# Patient Record
Sex: Male | Born: 1935 | Race: White | Hispanic: No | Marital: Married | Smoking: Former smoker
Health system: Southern US, Community
[De-identification: ages and names within clinical notes are randomized; demographics above are authoritative.]

## PROBLEM LIST (undated history)

## (undated) DIAGNOSIS — IMO0001 Reserved for inherently not codable concepts without codable children: Secondary | ICD-10-CM

## (undated) DIAGNOSIS — R7881 Bacteremia: Secondary | ICD-10-CM

## (undated) DIAGNOSIS — G459 Transient cerebral ischemic attack, unspecified: Secondary | ICD-10-CM

## (undated) DIAGNOSIS — N189 Chronic kidney disease, unspecified: Secondary | ICD-10-CM

## (undated) DIAGNOSIS — G309 Alzheimer's disease, unspecified: Secondary | ICD-10-CM

## (undated) DIAGNOSIS — I10 Essential (primary) hypertension: Secondary | ICD-10-CM

## (undated) DIAGNOSIS — I2109 ST elevation (STEMI) myocardial infarction involving other coronary artery of anterior wall: Secondary | ICD-10-CM

## (undated) DIAGNOSIS — C801 Malignant (primary) neoplasm, unspecified: Secondary | ICD-10-CM

## (undated) DIAGNOSIS — K219 Gastro-esophageal reflux disease without esophagitis: Secondary | ICD-10-CM

## (undated) DIAGNOSIS — K851 Biliary acute pancreatitis without necrosis or infection: Secondary | ICD-10-CM

## (undated) DIAGNOSIS — I441 Atrioventricular block, second degree: Secondary | ICD-10-CM

## (undated) DIAGNOSIS — A419 Sepsis, unspecified organism: Secondary | ICD-10-CM

## (undated) DIAGNOSIS — K922 Gastrointestinal hemorrhage, unspecified: Secondary | ICD-10-CM

## (undated) DIAGNOSIS — H9190 Unspecified hearing loss, unspecified ear: Secondary | ICD-10-CM

## (undated) DIAGNOSIS — I2510 Atherosclerotic heart disease of native coronary artery without angina pectoris: Secondary | ICD-10-CM

## (undated) DIAGNOSIS — F0280 Dementia in other diseases classified elsewhere without behavioral disturbance: Secondary | ICD-10-CM

## (undated) DIAGNOSIS — Z1621 Resistance to vancomycin: Secondary | ICD-10-CM

## (undated) DIAGNOSIS — J4489 Other specified chronic obstructive pulmonary disease: Secondary | ICD-10-CM

## (undated) DIAGNOSIS — E785 Hyperlipidemia, unspecified: Secondary | ICD-10-CM

## (undated) DIAGNOSIS — J449 Chronic obstructive pulmonary disease, unspecified: Secondary | ICD-10-CM

## (undated) DIAGNOSIS — F05 Delirium due to known physiological condition: Secondary | ICD-10-CM

## (undated) HISTORY — DX: Atrioventricular block, second degree: I44.1

## (undated) HISTORY — DX: Transient cerebral ischemic attack, unspecified: G45.9

## (undated) HISTORY — DX: Essential (primary) hypertension: I10

## (undated) HISTORY — DX: Gastrointestinal hemorrhage, unspecified: K92.2

## (undated) HISTORY — DX: Gastro-esophageal reflux disease without esophagitis: K21.9

## (undated) HISTORY — DX: ST elevation (STEMI) myocardial infarction involving other coronary artery of anterior wall: I21.09

## (undated) HISTORY — DX: Hyperlipidemia, unspecified: E78.5

## (undated) HISTORY — DX: Biliary acute pancreatitis without necrosis or infection: K85.10

## (undated) HISTORY — DX: Bacteremia: R78.81

## (undated) HISTORY — DX: Unspecified hearing loss, unspecified ear: H91.90

## (undated) HISTORY — DX: Reserved for inherently not codable concepts without codable children: IMO0001

## (undated) HISTORY — PX: OTHER SURGICAL HISTORY: SHX169

## (undated) HISTORY — DX: Morbid (severe) obesity due to excess calories: E66.01

## (undated) HISTORY — PX: CHOLECYSTECTOMY: SHX55

## (undated) HISTORY — DX: Resistance to vancomycin: Z16.21

## (undated) HISTORY — DX: Atherosclerotic heart disease of native coronary artery without angina pectoris: I25.10

---
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
## 1998-02-25 ENCOUNTER — Ambulatory Visit (HOSPITAL_COMMUNITY): Admission: RE | Admit: 1998-02-25 | Discharge: 1998-02-25 | Payer: Self-pay | Admitting: Family Medicine

## 1999-03-16 ENCOUNTER — Emergency Department (HOSPITAL_COMMUNITY): Admission: EM | Admit: 1999-03-16 | Discharge: 1999-03-16 | Payer: Self-pay | Admitting: Emergency Medicine

## 2000-02-05 ENCOUNTER — Inpatient Hospital Stay (HOSPITAL_COMMUNITY): Admission: EM | Admit: 2000-02-05 | Discharge: 2000-02-08 | Payer: Self-pay | Admitting: Gastroenterology

## 2000-04-19 ENCOUNTER — Emergency Department (HOSPITAL_COMMUNITY): Admission: EM | Admit: 2000-04-19 | Discharge: 2000-04-19 | Payer: Self-pay | Admitting: Emergency Medicine

## 2003-10-29 ENCOUNTER — Emergency Department (HOSPITAL_COMMUNITY): Admission: EM | Admit: 2003-10-29 | Discharge: 2003-10-29 | Payer: Self-pay | Admitting: Emergency Medicine

## 2003-12-24 ENCOUNTER — Inpatient Hospital Stay (HOSPITAL_COMMUNITY): Admission: EM | Admit: 2003-12-24 | Discharge: 2003-12-28 | Payer: Self-pay | Admitting: Emergency Medicine

## 2003-12-28 ENCOUNTER — Emergency Department (HOSPITAL_COMMUNITY): Admission: EM | Admit: 2003-12-28 | Discharge: 2003-12-29 | Payer: Self-pay | Admitting: Emergency Medicine

## 2006-02-09 ENCOUNTER — Observation Stay (HOSPITAL_COMMUNITY): Admission: EM | Admit: 2006-02-09 | Discharge: 2006-02-10 | Payer: Self-pay | Admitting: Emergency Medicine

## 2006-02-25 ENCOUNTER — Encounter: Admission: RE | Admit: 2006-02-25 | Discharge: 2006-03-04 | Payer: Self-pay | Admitting: *Deleted

## 2006-03-10 ENCOUNTER — Inpatient Hospital Stay (HOSPITAL_COMMUNITY): Admission: EM | Admit: 2006-03-10 | Discharge: 2006-03-12 | Payer: Self-pay | Admitting: Emergency Medicine

## 2006-03-11 ENCOUNTER — Encounter: Payer: Self-pay | Admitting: Vascular Surgery

## 2006-05-19 ENCOUNTER — Inpatient Hospital Stay (HOSPITAL_COMMUNITY): Admission: EM | Admit: 2006-05-19 | Discharge: 2006-05-20 | Payer: Self-pay | Admitting: Emergency Medicine

## 2006-05-24 ENCOUNTER — Inpatient Hospital Stay (HOSPITAL_COMMUNITY): Admission: EM | Admit: 2006-05-24 | Discharge: 2006-05-27 | Payer: Self-pay | Admitting: Emergency Medicine

## 2006-05-25 ENCOUNTER — Encounter: Payer: Self-pay | Admitting: Gastroenterology

## 2006-06-24 ENCOUNTER — Ambulatory Visit (HOSPITAL_COMMUNITY): Admission: RE | Admit: 2006-06-24 | Discharge: 2006-06-24 | Payer: Self-pay | Admitting: *Deleted

## 2006-12-04 ENCOUNTER — Emergency Department (HOSPITAL_COMMUNITY): Admission: EM | Admit: 2006-12-04 | Discharge: 2006-12-04 | Payer: Self-pay | Admitting: Emergency Medicine

## 2008-01-10 ENCOUNTER — Inpatient Hospital Stay (HOSPITAL_COMMUNITY): Admission: EM | Admit: 2008-01-10 | Discharge: 2008-01-12 | Payer: Self-pay | Admitting: Emergency Medicine

## 2008-10-05 ENCOUNTER — Ambulatory Visit: Payer: Self-pay | Admitting: Diagnostic Radiology

## 2008-10-05 ENCOUNTER — Encounter: Payer: Self-pay | Admitting: Emergency Medicine

## 2008-10-06 ENCOUNTER — Inpatient Hospital Stay (HOSPITAL_COMMUNITY): Admission: EM | Admit: 2008-10-06 | Discharge: 2008-10-08 | Payer: Self-pay | Admitting: Internal Medicine

## 2008-10-12 ENCOUNTER — Ambulatory Visit (HOSPITAL_COMMUNITY): Admission: RE | Admit: 2008-10-12 | Discharge: 2008-10-12 | Payer: Self-pay | Admitting: Gastroenterology

## 2008-12-03 ENCOUNTER — Emergency Department (HOSPITAL_BASED_OUTPATIENT_CLINIC_OR_DEPARTMENT_OTHER): Admission: EM | Admit: 2008-12-03 | Discharge: 2008-12-03 | Payer: Self-pay | Admitting: Emergency Medicine

## 2008-12-03 ENCOUNTER — Ambulatory Visit: Payer: Self-pay | Admitting: Radiology

## 2009-02-21 ENCOUNTER — Inpatient Hospital Stay (HOSPITAL_COMMUNITY): Admission: EM | Admit: 2009-02-21 | Discharge: 2009-02-24 | Payer: Self-pay | Admitting: Internal Medicine

## 2009-02-21 ENCOUNTER — Ambulatory Visit: Payer: Self-pay | Admitting: Interventional Radiology

## 2009-02-21 ENCOUNTER — Encounter: Payer: Self-pay | Admitting: Emergency Medicine

## 2009-02-22 ENCOUNTER — Encounter (INDEPENDENT_AMBULATORY_CARE_PROVIDER_SITE_OTHER): Payer: Self-pay | Admitting: Internal Medicine

## 2009-02-22 ENCOUNTER — Encounter: Payer: Self-pay | Admitting: Infectious Disease

## 2009-02-22 ENCOUNTER — Ambulatory Visit: Payer: Self-pay | Admitting: Vascular Surgery

## 2009-08-26 ENCOUNTER — Encounter: Payer: Self-pay | Admitting: Emergency Medicine

## 2009-08-26 ENCOUNTER — Inpatient Hospital Stay (HOSPITAL_COMMUNITY): Admission: EM | Admit: 2009-08-26 | Discharge: 2009-08-27 | Payer: Self-pay | Admitting: Internal Medicine

## 2009-08-26 ENCOUNTER — Ambulatory Visit: Payer: Self-pay | Admitting: Diagnostic Radiology

## 2009-09-24 ENCOUNTER — Ambulatory Visit: Payer: Self-pay | Admitting: Diagnostic Radiology

## 2009-09-24 ENCOUNTER — Emergency Department (HOSPITAL_BASED_OUTPATIENT_CLINIC_OR_DEPARTMENT_OTHER): Admission: EM | Admit: 2009-09-24 | Discharge: 2009-09-24 | Payer: Self-pay | Admitting: Emergency Medicine

## 2009-09-24 ENCOUNTER — Inpatient Hospital Stay (HOSPITAL_COMMUNITY): Admission: EM | Admit: 2009-09-24 | Discharge: 2009-09-27 | Payer: Self-pay | Admitting: Internal Medicine

## 2009-09-26 ENCOUNTER — Encounter (INDEPENDENT_AMBULATORY_CARE_PROVIDER_SITE_OTHER): Payer: Self-pay | Admitting: Internal Medicine

## 2009-09-26 ENCOUNTER — Ambulatory Visit: Payer: Self-pay | Admitting: Vascular Surgery

## 2009-12-17 ENCOUNTER — Ambulatory Visit: Payer: Self-pay | Admitting: Diagnostic Radiology

## 2009-12-17 ENCOUNTER — Inpatient Hospital Stay (HOSPITAL_COMMUNITY): Admission: EM | Admit: 2009-12-17 | Discharge: 2009-12-19 | Payer: Self-pay

## 2009-12-17 ENCOUNTER — Encounter: Payer: Self-pay | Admitting: Emergency Medicine

## 2010-05-03 ENCOUNTER — Encounter: Payer: Self-pay | Admitting: Emergency Medicine

## 2010-05-03 ENCOUNTER — Ambulatory Visit: Payer: Self-pay | Admitting: Radiology

## 2010-05-03 ENCOUNTER — Inpatient Hospital Stay (HOSPITAL_COMMUNITY): Admission: AD | Admit: 2010-05-03 | Discharge: 2010-05-04 | Payer: Self-pay | Admitting: Internal Medicine

## 2010-05-04 ENCOUNTER — Encounter (INDEPENDENT_AMBULATORY_CARE_PROVIDER_SITE_OTHER): Payer: Self-pay | Admitting: Internal Medicine

## 2010-07-05 ENCOUNTER — Ambulatory Visit: Payer: Self-pay | Admitting: Radiology

## 2010-07-05 ENCOUNTER — Encounter: Payer: Self-pay | Admitting: Emergency Medicine

## 2010-07-05 ENCOUNTER — Inpatient Hospital Stay (HOSPITAL_COMMUNITY): Admission: EM | Admit: 2010-07-05 | Discharge: 2010-07-10 | Payer: Self-pay | Admitting: Internal Medicine

## 2010-07-07 ENCOUNTER — Ambulatory Visit: Payer: Self-pay | Admitting: Infectious Diseases

## 2010-08-31 ENCOUNTER — Emergency Department (HOSPITAL_BASED_OUTPATIENT_CLINIC_OR_DEPARTMENT_OTHER): Admission: EM | Admit: 2010-08-31 | Discharge: 2010-02-14 | Payer: Self-pay | Admitting: Emergency Medicine

## 2010-10-12 ENCOUNTER — Emergency Department (HOSPITAL_BASED_OUTPATIENT_CLINIC_OR_DEPARTMENT_OTHER)
Admission: EM | Admit: 2010-10-12 | Discharge: 2010-10-12 | Payer: Self-pay | Source: Home / Self Care | Admitting: Emergency Medicine

## 2010-10-14 ENCOUNTER — Encounter: Payer: Self-pay | Admitting: *Deleted

## 2010-10-15 ENCOUNTER — Encounter: Payer: Self-pay | Admitting: *Deleted

## 2010-10-16 LAB — COMPREHENSIVE METABOLIC PANEL
Albumin: 3.8 g/dL (ref 3.5–5.2)
Alkaline Phosphatase: 191 U/L — ABNORMAL HIGH (ref 39–117)
BUN: 10 mg/dL (ref 6–23)
CO2: 28 mEq/L (ref 19–32)
Chloride: 105 mEq/L (ref 96–112)
GFR calc non Af Amer: 58 mL/min — ABNORMAL LOW (ref >60)
Glucose, Bld: 119 mg/dL — ABNORMAL HIGH (ref 70–99)
Potassium: 4.3 mEq/L (ref 3.5–5.1)
Total Bilirubin: 1.1 mg/dL (ref 0.3–1.2)

## 2010-10-16 LAB — DIFFERENTIAL
Lymphs Abs: 2.1 10*3/uL (ref 0.7–4.0)
Monocytes Absolute: 0.7 10*3/uL (ref 0.1–1.0)
Monocytes Relative: 7 % (ref 3–12)
Neutro Abs: 6.6 10*3/uL (ref 1.7–7.7)
Neutrophils Relative %: 70 % (ref 43–77)

## 2010-10-16 LAB — CBC
HCT: 39.2 % (ref 39.0–52.0)
Hemoglobin: 13.3 g/dL (ref 13.0–17.0)
MCH: 30.4 pg (ref 26.0–34.0)
MCHC: 33.9 g/dL (ref 30.0–36.0)
MCV: 89.5 fL (ref 78.0–100.0)
RBC: 4.38 MIL/uL (ref 4.22–5.81)

## 2010-10-16 LAB — URINALYSIS, ROUTINE W REFLEX MICROSCOPIC
Bilirubin Urine: NEGATIVE
Hgb urine dipstick: NEGATIVE
Ketones, ur: NEGATIVE mg/dL
Protein, ur: NEGATIVE mg/dL
Urine Glucose, Fasting: NEGATIVE mg/dL
pH: 7.5 (ref 5.0–8.0)

## 2010-10-24 ENCOUNTER — Other Ambulatory Visit: Payer: Self-pay | Admitting: Diagnostic Neuroimaging

## 2010-10-24 DIAGNOSIS — R413 Other amnesia: Secondary | ICD-10-CM

## 2010-10-24 DIAGNOSIS — F0390 Unspecified dementia without behavioral disturbance: Secondary | ICD-10-CM

## 2010-10-27 ENCOUNTER — Ambulatory Visit
Admission: RE | Admit: 2010-10-27 | Discharge: 2010-10-27 | Disposition: A | Discharge status: Home / Self Care | Payer: Self-pay | Source: Ambulatory Visit | Attending: Diagnostic Neuroimaging | Admitting: Diagnostic Neuroimaging

## 2010-10-27 DIAGNOSIS — F0390 Unspecified dementia without behavioral disturbance: Secondary | ICD-10-CM

## 2010-10-27 DIAGNOSIS — R413 Other amnesia: Secondary | ICD-10-CM

## 2010-10-27 MED ORDER — GADOBENATE DIMEGLUMINE 529 MG/ML IV SOLN
15.0000 mL | Freq: Once | INTRAVENOUS | Status: AC | PRN
  Administered 2010-10-27: 15 mL via INTRAVENOUS

## 2010-12-06 LAB — COMPREHENSIVE METABOLIC PANEL
ALT: 151 U/L — ABNORMAL HIGH (ref 0–53)
ALT: 217 U/L — ABNORMAL HIGH (ref 0–53)
Albumin: 2.5 g/dL — ABNORMAL LOW (ref 3.5–5.2)
Albumin: 2.7 g/dL — ABNORMAL LOW (ref 3.5–5.2)
Alkaline Phosphatase: 189 U/L — ABNORMAL HIGH (ref 39–117)
Alkaline Phosphatase: 205 U/L — ABNORMAL HIGH (ref 39–117)
Alkaline Phosphatase: 231 U/L — ABNORMAL HIGH (ref 39–117)
BUN: 11 mg/dL (ref 6–23)
BUN: 5 mg/dL — ABNORMAL LOW (ref 6–23)
BUN: 9 mg/dL (ref 6–23)
CO2: 25 mEq/L (ref 19–32)
Calcium: 8.1 mg/dL — ABNORMAL LOW (ref 8.4–10.5)
Chloride: 105 mEq/L (ref 96–112)
Chloride: 108 mEq/L (ref 96–112)
Creatinine, Ser: 1.06 mg/dL (ref 0.4–1.5)
Creatinine, Ser: 1.18 mg/dL (ref 0.4–1.5)
GFR calc Af Amer: >60 mL/min (ref >60)
GFR calc non Af Amer: 46 mL/min — ABNORMAL LOW (ref >60)
GFR calc non Af Amer: 59 mL/min — ABNORMAL LOW (ref >60)
Glucose, Bld: 101 mg/dL — ABNORMAL HIGH (ref 70–99)
Glucose, Bld: 96 mg/dL (ref 70–99)
Glucose, Bld: 97 mg/dL (ref 70–99)
Potassium: 3.6 mEq/L (ref 3.5–5.1)
Potassium: 3.7 mEq/L (ref 3.5–5.1)
Potassium: 3.7 mEq/L (ref 3.5–5.1)
Potassium: 3.8 mEq/L (ref 3.5–5.1)
Sodium: 136 mEq/L (ref 135–145)
Sodium: 137 mEq/L (ref 135–145)
Total Bilirubin: 0.8 mg/dL (ref 0.3–1.2)
Total Bilirubin: 1.7 mg/dL — ABNORMAL HIGH (ref 0.3–1.2)
Total Protein: 4.7 g/dL — ABNORMAL LOW (ref 6.0–8.3)
Total Protein: 5.6 g/dL — ABNORMAL LOW (ref 6.0–8.3)

## 2010-12-06 LAB — GLUCOSE, CAPILLARY
Glucose-Capillary: 107 mg/dL — ABNORMAL HIGH (ref 70–99)
Glucose-Capillary: 109 mg/dL — ABNORMAL HIGH (ref 70–99)
Glucose-Capillary: 117 mg/dL — ABNORMAL HIGH (ref 70–99)
Glucose-Capillary: 118 mg/dL — ABNORMAL HIGH (ref 70–99)
Glucose-Capillary: 140 mg/dL — ABNORMAL HIGH (ref 70–99)
Glucose-Capillary: 88 mg/dL (ref 70–99)
Glucose-Capillary: 93 mg/dL (ref 70–99)
Glucose-Capillary: 93 mg/dL (ref 70–99)
Glucose-Capillary: 93 mg/dL (ref 70–99)

## 2010-12-06 LAB — DIFFERENTIAL
Basophils Absolute: 0 10*3/uL (ref 0.0–0.1)
Basophils Relative: 0 % (ref 0–1)
Eosinophils Absolute: 0 10*3/uL (ref 0.0–0.7)
Monocytes Relative: 6 % (ref 3–12)
Neutrophils Relative %: 93 % — ABNORMAL HIGH (ref 43–77)

## 2010-12-06 LAB — CBC
HCT: 32.2 % — ABNORMAL LOW (ref 39.0–52.0)
HCT: 33.6 % — ABNORMAL LOW (ref 39.0–52.0)
HCT: 34.6 % — ABNORMAL LOW (ref 39.0–52.0)
Hemoglobin: 11.2 g/dL — ABNORMAL LOW (ref 13.0–17.0)
MCH: 30 pg (ref 26.0–34.0)
MCV: 89.3 fL (ref 78.0–100.0)
MCV: 91.8 fL (ref 78.0–100.0)
MCV: 91.8 fL (ref 78.0–100.0)
Platelets: 188 10*3/uL (ref 150–400)
Platelets: 214 10*3/uL (ref 150–400)
RBC: 3.52 MIL/uL — ABNORMAL LOW (ref 4.22–5.81)
RBC: 3.77 MIL/uL — ABNORMAL LOW (ref 4.22–5.81)
RDW: 14 % (ref 11.5–15.5)
RDW: 14.2 % (ref 11.5–15.5)
RDW: 14.5 % (ref 11.5–15.5)
WBC: 15.6 10*3/uL — ABNORMAL HIGH (ref 4.0–10.5)
WBC: 5.8 10*3/uL (ref 4.0–10.5)
WBC: 7.4 10*3/uL (ref 4.0–10.5)
WBC: 9.4 10*3/uL (ref 4.0–10.5)

## 2010-12-06 LAB — VANCOMYCIN, TROUGH: Vancomycin Tr: 12 ug/mL (ref 10.0–20.0)

## 2010-12-06 LAB — LACTIC ACID, PLASMA: Lactic Acid, Venous: 2.1 mmol/L (ref 0.5–2.2)

## 2010-12-07 LAB — DIFFERENTIAL
Eosinophils Relative: 0 % (ref 0–5)
Lymphocytes Relative: 10 % — ABNORMAL LOW (ref 12–46)
Lymphs Abs: 0.7 10*3/uL (ref 0.7–4.0)

## 2010-12-07 LAB — URINALYSIS, ROUTINE W REFLEX MICROSCOPIC
Bilirubin Urine: NEGATIVE
Hgb urine dipstick: NEGATIVE
Specific Gravity, Urine: 1.01 (ref 1.005–1.030)
Urobilinogen, UA: 1 mg/dL (ref 0.0–1.0)
pH: 8 (ref 5.0–8.0)

## 2010-12-07 LAB — COMPREHENSIVE METABOLIC PANEL
AST: 752 U/L — ABNORMAL HIGH (ref 0–37)
Albumin: 4 g/dL (ref 3.5–5.2)
CO2: 26 mEq/L (ref 19–32)
Calcium: 9.3 mg/dL (ref 8.4–10.5)
Creatinine, Ser: 1.2 mg/dL (ref 0.4–1.5)
GFR calc Af Amer: >60 mL/min (ref >60)
GFR calc non Af Amer: 58 mL/min — ABNORMAL LOW (ref >60)
Total Protein: 6.8 g/dL (ref 6.0–8.3)

## 2010-12-07 LAB — CULTURE, BLOOD (ROUTINE X 2)

## 2010-12-07 LAB — CBC
MCH: 30.4 pg (ref 26.0–34.0)
MCHC: 33.2 g/dL (ref 30.0–36.0)
Platelets: 240 10*3/uL (ref 150–400)
RDW: 13.4 % (ref 11.5–15.5)

## 2010-12-07 LAB — POCT CARDIAC MARKERS
CKMB, poc: <1 ng/mL — ABNORMAL LOW (ref 1.0–8.0)
Myoglobin, poc: 69.1 ng/mL (ref 12–200)

## 2010-12-07 LAB — LIPASE, BLOOD: Lipase: 945 U/L — ABNORMAL HIGH (ref 23–300)

## 2010-12-08 LAB — LIPID PANEL
Cholesterol: 202 mg/dL — ABNORMAL HIGH (ref 0–200)
LDL Cholesterol: 125 mg/dL — ABNORMAL HIGH (ref 0–99)
Triglycerides: 120 mg/dL (ref <150)
VLDL: 24 mg/dL (ref 0–40)

## 2010-12-08 LAB — COMPREHENSIVE METABOLIC PANEL
ALT: 13 U/L (ref 0–53)
AST: 17 U/L (ref 0–37)
Albumin: 3 g/dL — ABNORMAL LOW (ref 3.5–5.2)
CO2: 26 mEq/L (ref 19–32)
Calcium: 8.8 mg/dL (ref 8.4–10.5)
Chloride: 108 mEq/L (ref 96–112)
GFR calc Af Amer: >60 mL/min (ref >60)
GFR calc non Af Amer: 58 mL/min — ABNORMAL LOW (ref >60)
Sodium: 140 mEq/L (ref 135–145)

## 2010-12-08 LAB — CBC
Hemoglobin: 11.5 g/dL — ABNORMAL LOW (ref 13.0–17.0)
MCH: 31.1 pg (ref 26.0–34.0)
MCHC: 34.2 g/dL (ref 30.0–36.0)
MCHC: 33.8 g/dL (ref 30.0–36.0)
RBC: 3.69 MIL/uL — ABNORMAL LOW (ref 4.22–5.81)
RDW: 14.1 % (ref 11.5–15.5)

## 2010-12-08 LAB — POCT B-TYPE NATRIURETIC PEPTIDE (BNP): B Natriuretic Peptide, POC: 30.5 pg/mL (ref 0–100)

## 2010-12-08 LAB — D-DIMER, QUANTITATIVE: D-Dimer, Quant: 1.86 ug/mL-FEU — ABNORMAL HIGH (ref 0.00–0.48)

## 2010-12-08 LAB — POCT CARDIAC MARKERS: Myoglobin, poc: 55.3 ng/mL (ref 12–200)

## 2010-12-08 LAB — URINALYSIS, ROUTINE W REFLEX MICROSCOPIC
Bilirubin Urine: NEGATIVE
Glucose, UA: NEGATIVE mg/dL
Hgb urine dipstick: NEGATIVE
Ketones, ur: NEGATIVE mg/dL
Nitrite: NEGATIVE
pH: 7 (ref 5.0–8.0)

## 2010-12-08 LAB — CULTURE, BLOOD (ROUTINE X 2): Culture: NO GROWTH

## 2010-12-08 LAB — BASIC METABOLIC PANEL
BUN: 11 mg/dL (ref 6–23)
CO2: 25 mEq/L (ref 19–32)
Calcium: 9 mg/dL (ref 8.4–10.5)
Creatinine, Ser: 1.2 mg/dL (ref 0.4–1.5)
GFR calc non Af Amer: 58 mL/min — ABNORMAL LOW (ref >60)
Glucose, Bld: 60 mg/dL — ABNORMAL LOW (ref 70–99)

## 2010-12-08 LAB — CARDIAC PANEL(CRET KIN+CKTOT+MB+TROPI)
CK, MB: 1.2 ng/mL (ref 0.3–4.0)
CK, MB: 1.2 ng/mL (ref 0.3–4.0)
Total CK: 40 U/L (ref 7–232)

## 2010-12-08 LAB — DIFFERENTIAL
Basophils Absolute: 0 10*3/uL (ref 0.0–0.1)
Basophils Relative: 0 % (ref 0–1)
Eosinophils Absolute: 0.1 10*3/uL (ref 0.0–0.7)
Monocytes Absolute: 0.5 10*3/uL (ref 0.1–1.0)
Monocytes Relative: 7 % (ref 3–12)
Neutro Abs: 4.6 10*3/uL (ref 1.7–7.7)
Neutrophils Relative %: 67 % (ref 43–77)

## 2010-12-08 LAB — URINE CULTURE
Colony Count: NO GROWTH
Culture  Setup Time: 201108111040
Culture: NO GROWTH
Special Requests: NEGATIVE

## 2010-12-08 LAB — HEPATIC FUNCTION PANEL
ALT: 12 U/L (ref 0–53)
Indirect Bilirubin: 0.7 mg/dL (ref 0.3–0.9)
Total Protein: 6.6 g/dL (ref 6.0–8.3)

## 2010-12-08 LAB — GLUCOSE, CAPILLARY
Glucose-Capillary: 110 mg/dL — ABNORMAL HIGH (ref 70–99)
Glucose-Capillary: 97 mg/dL (ref 70–99)

## 2010-12-08 LAB — PROTIME-INR: INR: 1.08 (ref 0.00–1.49)

## 2010-12-10 LAB — CBC
HCT: 33.5 % — ABNORMAL LOW (ref 39.0–52.0)
HCT: 34.2 % — ABNORMAL LOW (ref 39.0–52.0)
HCT: 40.4 % (ref 39.0–52.0)
Hemoglobin: 11.5 g/dL — ABNORMAL LOW (ref 13.0–17.0)
Hemoglobin: 11.9 g/dL — ABNORMAL LOW (ref 13.0–17.0)
Hemoglobin: 13.8 g/dL (ref 13.0–17.0)
MCHC: 34.4 g/dL (ref 30.0–36.0)
MCHC: 34.8 g/dL (ref 30.0–36.0)
MCHC: 34.8 g/dL (ref 30.0–36.0)
MCV: 92.7 fL (ref 78.0–100.0)
MCV: 94.2 fL (ref 78.0–100.0)
Platelets: 164 10*3/uL (ref 150–400)
Platelets: 167 10*3/uL (ref 150–400)
Platelets: 260 10*3/uL (ref 150–400)
RBC: 3.63 MIL/uL — ABNORMAL LOW (ref 4.22–5.81)
RBC: 3.67 MIL/uL — ABNORMAL LOW (ref 4.22–5.81)
RBC: 4.38 MIL/uL (ref 4.22–5.81)
RDW: 13.5 % (ref 11.5–15.5)
RDW: 13.6 % (ref 11.5–15.5)
RDW: 13.7 % (ref 11.5–15.5)
WBC: 18 10*3/uL — ABNORMAL HIGH (ref 4.0–10.5)

## 2010-12-10 LAB — COMPREHENSIVE METABOLIC PANEL
ALT: 366 U/L — ABNORMAL HIGH (ref 0–53)
ALT: 718 U/L — ABNORMAL HIGH (ref 0–53)
AST: 1270 U/L — ABNORMAL HIGH (ref 0–37)
AST: 334 U/L — ABNORMAL HIGH (ref 0–37)
AST: 442 U/L — ABNORMAL HIGH (ref 0–37)
Albumin: 3 g/dL — ABNORMAL LOW (ref 3.5–5.2)
Albumin: 3.8 g/dL (ref 3.5–5.2)
Alkaline Phosphatase: 226 U/L — ABNORMAL HIGH (ref 39–117)
BUN: 8 mg/dL (ref 6–23)
BUN: 14 mg/dL (ref 6–23)
CO2: 25 mEq/L (ref 19–32)
CO2: 26 mEq/L (ref 19–32)
CO2: 21 mEq/L (ref 19–32)
Calcium: 8.4 mg/dL (ref 8.4–10.5)
Calcium: 8.8 mg/dL (ref 8.4–10.5)
Calcium: 8.8 mg/dL (ref 8.4–10.5)
Chloride: 103 mEq/L (ref 96–112)
Chloride: 98 mEq/L (ref 96–112)
Creatinine, Ser: 1 mg/dL (ref 0.4–1.5)
Creatinine, Ser: 1.24 mg/dL (ref 0.4–1.5)
GFR calc Af Amer: >60 mL/min (ref >60)
GFR calc Af Amer: >60 mL/min (ref >60)
GFR calc non Af Amer: 53 mL/min — ABNORMAL LOW (ref >60)
GFR calc non Af Amer: 56 mL/min — ABNORMAL LOW (ref >60)
Glucose, Bld: 112 mg/dL — ABNORMAL HIGH (ref 70–99)
Potassium: 3.7 mEq/L (ref 3.5–5.1)
Sodium: 137 mEq/L (ref 135–145)
Sodium: 139 mEq/L (ref 135–145)
Total Bilirubin: 1 mg/dL (ref 0.3–1.2)
Total Protein: 5.1 g/dL — ABNORMAL LOW (ref 6.0–8.3)
Total Protein: 5.2 g/dL — ABNORMAL LOW (ref 6.0–8.3)

## 2010-12-10 LAB — URINALYSIS, ROUTINE W REFLEX MICROSCOPIC
Hgb urine dipstick: NEGATIVE
Ketones, ur: NEGATIVE mg/dL
Nitrite: NEGATIVE
Protein, ur: NEGATIVE mg/dL
Specific Gravity, Urine: 1.015 (ref 1.005–1.030)
Urobilinogen, UA: 2 mg/dL — ABNORMAL HIGH (ref 0.0–1.0)

## 2010-12-10 LAB — DIFFERENTIAL
Basophils Absolute: 0.1 10*3/uL (ref 0.0–0.1)
Basophils Relative: 0 % (ref 0–1)
Basophils Relative: 1 % (ref 0–1)
Eosinophils Absolute: 0.1 10*3/uL (ref 0.0–0.7)
Eosinophils Relative: 0 % (ref 0–5)
Lymphocytes Relative: 11 % — ABNORMAL LOW (ref 12–46)
Lymphocytes Relative: 7 % — ABNORMAL LOW (ref 12–46)
Lymphs Abs: 1 10*3/uL (ref 0.7–4.0)
Lymphs Abs: 1 10*3/uL (ref 0.7–4.0)
Monocytes Relative: 5 % (ref 3–12)
Monocytes Relative: 5 % (ref 3–12)
Monocytes Relative: 3 % (ref 3–12)
Neutro Abs: 7.8 10*3/uL — ABNORMAL HIGH (ref 1.7–7.7)
Neutro Abs: 14.4 10*3/uL — ABNORMAL HIGH (ref 1.7–7.7)
Neutrophils Relative %: 83 % — ABNORMAL HIGH (ref 43–77)
Neutrophils Relative %: 88 % — ABNORMAL HIGH (ref 43–77)
Neutrophils Relative %: 80 % — ABNORMAL HIGH (ref 43–77)

## 2010-12-10 LAB — POCT CARDIAC MARKERS
CKMB, poc: <1 ng/mL — ABNORMAL LOW (ref 1.0–8.0)
CKMB, poc: <1 ng/mL — ABNORMAL LOW (ref 1.0–8.0)
Myoglobin, poc: 384 ng/mL (ref 12–200)
Troponin i, poc: <0.05 ng/mL (ref 0.00–0.09)

## 2010-12-10 LAB — URINE CULTURE: Colony Count: 30000

## 2010-12-10 LAB — PROTIME-INR
INR: 0.89 (ref 0.00–1.49)
Prothrombin Time: 12 seconds (ref 11.6–15.2)

## 2010-12-10 LAB — CULTURE, BLOOD (ROUTINE X 2): Culture: NO GROWTH

## 2010-12-10 LAB — GLUCOSE, CAPILLARY
Glucose-Capillary: 109 mg/dL — ABNORMAL HIGH (ref 70–99)
Glucose-Capillary: 112 mg/dL — ABNORMAL HIGH (ref 70–99)
Glucose-Capillary: 128 mg/dL — ABNORMAL HIGH (ref 70–99)
Glucose-Capillary: 132 mg/dL — ABNORMAL HIGH (ref 70–99)

## 2010-12-10 LAB — LIPASE, BLOOD: Lipase: 11 U/L (ref 11–59)

## 2010-12-10 LAB — MAGNESIUM: Magnesium: 2.1 mg/dL (ref 1.5–2.5)

## 2010-12-11 LAB — DIFFERENTIAL
Basophils Absolute: 0 10*3/uL (ref 0.0–0.1)
Eosinophils Relative: 2 % (ref 0–5)
Lymphocytes Relative: 24 % (ref 12–46)
Lymphs Abs: 1.8 10*3/uL (ref 0.7–4.0)
Monocytes Absolute: 0.4 10*3/uL (ref 0.1–1.0)
Monocytes Relative: 6 % (ref 3–12)
Neutro Abs: 5.2 10*3/uL (ref 1.7–7.7)

## 2010-12-11 LAB — URINALYSIS, ROUTINE W REFLEX MICROSCOPIC
Glucose, UA: NEGATIVE mg/dL
Hgb urine dipstick: NEGATIVE
Ketones, ur: NEGATIVE mg/dL
Protein, ur: NEGATIVE mg/dL
Urobilinogen, UA: 1 mg/dL (ref 0.0–1.0)

## 2010-12-11 LAB — COMPREHENSIVE METABOLIC PANEL
AST: 150 U/L — ABNORMAL HIGH (ref 0–37)
Albumin: 3.6 g/dL (ref 3.5–5.2)
BUN: 16 mg/dL (ref 6–23)
Creatinine, Ser: 1.2 mg/dL (ref 0.4–1.5)
GFR calc Af Amer: >60 mL/min (ref >60)
Total Protein: 6.8 g/dL (ref 6.0–8.3)

## 2010-12-11 LAB — CBC
HCT: 40.1 % (ref 39.0–52.0)
MCV: 92.9 fL (ref 78.0–100.0)
Platelets: 254 10*3/uL (ref 150–400)
RDW: 13.2 % (ref 11.5–15.5)
WBC: 7.5 10*3/uL (ref 4.0–10.5)

## 2010-12-17 LAB — BASIC METABOLIC PANEL WITH GFR
BUN: 7 mg/dL (ref 6–23)
CO2: 27 meq/L (ref 19–32)
Calcium: 8.5 mg/dL (ref 8.4–10.5)
Chloride: 105 meq/L (ref 96–112)
Creatinine, Ser: 1.09 mg/dL (ref 0.4–1.5)
GFR calc non Af Amer: >60 mL/min
Glucose, Bld: 119 mg/dL — ABNORMAL HIGH (ref 70–99)
Potassium: 3.8 meq/L (ref 3.5–5.1)
Sodium: 139 meq/L (ref 135–145)

## 2010-12-17 LAB — COMPREHENSIVE METABOLIC PANEL WITH GFR
ALT: 117 U/L — ABNORMAL HIGH (ref 0–53)
AST: 94 U/L — ABNORMAL HIGH (ref 0–37)
Albumin: 2.9 g/dL — ABNORMAL LOW (ref 3.5–5.2)
Alkaline Phosphatase: 111 U/L (ref 39–117)
BUN: 5 mg/dL — ABNORMAL LOW (ref 6–23)
CO2: 24 meq/L (ref 19–32)
Calcium: 8.6 mg/dL (ref 8.4–10.5)
Chloride: 111 meq/L (ref 96–112)
Creatinine, Ser: 0.93 mg/dL (ref 0.4–1.5)
GFR calc non Af Amer: >60 mL/min
Glucose, Bld: 136 mg/dL — ABNORMAL HIGH (ref 70–99)
Potassium: 3.6 meq/L (ref 3.5–5.1)
Sodium: 141 meq/L (ref 135–145)
Total Bilirubin: 0.6 mg/dL (ref 0.3–1.2)
Total Protein: 5.2 g/dL — ABNORMAL LOW (ref 6.0–8.3)

## 2010-12-17 LAB — CBC
HCT: 33.2 % — ABNORMAL LOW (ref 39.0–52.0)
HCT: 34.8 % — ABNORMAL LOW (ref 39.0–52.0)
Hemoglobin: 11.8 g/dL — ABNORMAL LOW (ref 13.0–17.0)
MCHC: 34 g/dL (ref 30.0–36.0)
MCV: 94.4 fL (ref 78.0–100.0)
Platelets: 196 K/uL (ref 150–400)
Platelets: 197 10*3/uL (ref 150–400)
RBC: 3.68 MIL/uL — ABNORMAL LOW (ref 4.22–5.81)
RDW: 14.3 % (ref 11.5–15.5)
RDW: 14.6 % (ref 11.5–15.5)
WBC: 5.2 K/uL (ref 4.0–10.5)

## 2010-12-17 LAB — GLUCOSE, CAPILLARY
Glucose-Capillary: 113 mg/dL — ABNORMAL HIGH (ref 70–99)
Glucose-Capillary: 124 mg/dL — ABNORMAL HIGH (ref 70–99)
Glucose-Capillary: 127 mg/dL — ABNORMAL HIGH (ref 70–99)
Glucose-Capillary: 136 mg/dL — ABNORMAL HIGH (ref 70–99)
Glucose-Capillary: 155 mg/dL — ABNORMAL HIGH (ref 70–99)

## 2010-12-17 LAB — LIPASE, BLOOD: Lipase: 16 U/L (ref 11–59)

## 2010-12-18 LAB — CULTURE, BLOOD (ROUTINE X 2): Culture: NO GROWTH

## 2010-12-18 LAB — COMPREHENSIVE METABOLIC PANEL
AST: 148 U/L — ABNORMAL HIGH (ref 0–37)
Albumin: 3.4 g/dL — ABNORMAL LOW (ref 3.5–5.2)
Calcium: 9.1 mg/dL (ref 8.4–10.5)
Creatinine, Ser: 1.1 mg/dL (ref 0.4–1.5)
GFR calc Af Amer: >60 mL/min (ref >60)
Total Protein: 6.1 g/dL (ref 6.0–8.3)

## 2010-12-18 LAB — DIFFERENTIAL
Eosinophils Relative: 1 % (ref 0–5)
Lymphocytes Relative: 33 % (ref 12–46)
Lymphs Abs: 2.2 10*3/uL (ref 0.7–4.0)
Monocytes Absolute: 0.3 10*3/uL (ref 0.1–1.0)

## 2010-12-18 LAB — POCT B-TYPE NATRIURETIC PEPTIDE (BNP)
B Natriuretic Peptide, POC: 10.4 pg/mL (ref 0–100)
B Natriuretic Peptide, POC: 9.1 pg/mL (ref 0–100)

## 2010-12-18 LAB — LIPASE, BLOOD: Lipase: 1415 U/L — ABNORMAL HIGH (ref 23–300)

## 2010-12-18 LAB — URINALYSIS, ROUTINE W REFLEX MICROSCOPIC
Bilirubin Urine: NEGATIVE
Nitrite: NEGATIVE
Specific Gravity, Urine: 1.008 (ref 1.005–1.030)
pH: 7 (ref 5.0–8.0)

## 2010-12-18 LAB — POCT CARDIAC MARKERS: CKMB, poc: <1 ng/mL — ABNORMAL LOW (ref 1.0–8.0)

## 2010-12-18 LAB — CBC
MCHC: 34 g/dL (ref 30.0–36.0)
MCV: 93.6 fL (ref 78.0–100.0)
Platelets: 258 10*3/uL (ref 150–400)

## 2010-12-24 ENCOUNTER — Emergency Department (INDEPENDENT_AMBULATORY_CARE_PROVIDER_SITE_OTHER): Payer: No Typology Code available for payment source

## 2010-12-24 ENCOUNTER — Emergency Department (HOSPITAL_BASED_OUTPATIENT_CLINIC_OR_DEPARTMENT_OTHER)
Admission: EM | Admit: 2010-12-24 | Discharge: 2010-12-24 | Disposition: A | Discharge status: Other Acute Inpatient Hospital | Payer: No Typology Code available for payment source | Source: Home / Self Care | Attending: Emergency Medicine | Admitting: Emergency Medicine

## 2010-12-24 ENCOUNTER — Inpatient Hospital Stay (HOSPITAL_COMMUNITY)
Admission: AD | Admit: 2010-12-24 | Discharge: 2010-12-29 | DRG: 243 | Disposition: A | Discharge status: Home / Self Care | Payer: No Typology Code available for payment source | Source: Other Acute Inpatient Hospital | Attending: Cardiology | Admitting: Cardiology

## 2010-12-24 ENCOUNTER — Ambulatory Visit (HOSPITAL_COMMUNITY)
Admission: EM | Admit: 2010-12-24 | Discharge: 2010-12-24 | Disposition: A | Discharge status: Home / Self Care | Payer: No Typology Code available for payment source | Source: Ambulatory Visit | Attending: Cardiology | Admitting: Cardiology

## 2010-12-24 DIAGNOSIS — E119 Type 2 diabetes mellitus without complications: Secondary | ICD-10-CM | POA: Diagnosis present

## 2010-12-24 DIAGNOSIS — J449 Chronic obstructive pulmonary disease, unspecified: Secondary | ICD-10-CM | POA: Insufficient documentation

## 2010-12-24 DIAGNOSIS — R079 Chest pain, unspecified: Secondary | ICD-10-CM

## 2010-12-24 DIAGNOSIS — I10 Essential (primary) hypertension: Secondary | ICD-10-CM | POA: Diagnosis present

## 2010-12-24 DIAGNOSIS — H9190 Unspecified hearing loss, unspecified ear: Secondary | ICD-10-CM | POA: Diagnosis present

## 2010-12-24 DIAGNOSIS — I441 Atrioventricular block, second degree: Secondary | ICD-10-CM | POA: Diagnosis not present

## 2010-12-24 DIAGNOSIS — E785 Hyperlipidemia, unspecified: Secondary | ICD-10-CM | POA: Insufficient documentation

## 2010-12-24 DIAGNOSIS — E7800 Pure hypercholesterolemia, unspecified: Secondary | ICD-10-CM | POA: Insufficient documentation

## 2010-12-24 DIAGNOSIS — I219 Acute myocardial infarction, unspecified: Secondary | ICD-10-CM | POA: Insufficient documentation

## 2010-12-24 DIAGNOSIS — I2109 ST elevation (STEMI) myocardial infarction involving other coronary artery of anterior wall: Secondary | ICD-10-CM | POA: Insufficient documentation

## 2010-12-24 DIAGNOSIS — R7402 Elevation of levels of lactic acid dehydrogenase (LDH): Secondary | ICD-10-CM | POA: Diagnosis present

## 2010-12-24 DIAGNOSIS — I2589 Other forms of chronic ischemic heart disease: Secondary | ICD-10-CM | POA: Diagnosis present

## 2010-12-24 DIAGNOSIS — Z880 Allergy status to penicillin: Secondary | ICD-10-CM

## 2010-12-24 DIAGNOSIS — Z7982 Long term (current) use of aspirin: Secondary | ICD-10-CM

## 2010-12-24 DIAGNOSIS — J4489 Other specified chronic obstructive pulmonary disease: Secondary | ICD-10-CM | POA: Insufficient documentation

## 2010-12-24 DIAGNOSIS — I2510 Atherosclerotic heart disease of native coronary artery without angina pectoris: Secondary | ICD-10-CM | POA: Diagnosis present

## 2010-12-24 DIAGNOSIS — R7401 Elevation of levels of liver transaminase levels: Secondary | ICD-10-CM | POA: Diagnosis present

## 2010-12-24 DIAGNOSIS — R0789 Other chest pain: Secondary | ICD-10-CM

## 2010-12-24 DIAGNOSIS — Z888 Allergy status to other drugs, medicaments and biological substances status: Secondary | ICD-10-CM

## 2010-12-24 DIAGNOSIS — K219 Gastro-esophageal reflux disease without esophagitis: Secondary | ICD-10-CM | POA: Diagnosis present

## 2010-12-24 DIAGNOSIS — Z79899 Other long term (current) drug therapy: Secondary | ICD-10-CM

## 2010-12-24 DIAGNOSIS — Z8673 Personal history of transient ischemic attack (TIA), and cerebral infarction without residual deficits: Secondary | ICD-10-CM

## 2010-12-24 LAB — CBC
Hemoglobin: 14.1 g/dL (ref 13.0–17.0)
MCH: 30.8 pg (ref 26.0–34.0)
MCHC: 34.1 g/dL (ref 30.0–36.0)
MCV: 90.2 fL (ref 78.0–100.0)
Platelets: 255 10*3/uL (ref 150–400)
RBC: 4.58 MIL/uL (ref 4.22–5.81)

## 2010-12-24 LAB — BASIC METABOLIC PANEL
BUN: 13 mg/dL (ref 6–23)
CO2: 26 mEq/L (ref 19–32)
Calcium: 9 mg/dL (ref 8.4–10.5)
Chloride: 106 mEq/L (ref 96–112)
Creatinine, Ser: 1.1 mg/dL (ref 0.4–1.5)

## 2010-12-24 LAB — GLUCOSE, CAPILLARY: Glucose-Capillary: 139 mg/dL — ABNORMAL HIGH (ref 70–99)

## 2010-12-24 LAB — DIFFERENTIAL
Eosinophils Absolute: 0.1 10*3/uL (ref 0.0–0.7)
Lymphs Abs: 3.5 10*3/uL (ref 0.7–4.0)
Monocytes Absolute: 0.5 10*3/uL (ref 0.1–1.0)
Monocytes Relative: 7 % (ref 3–12)
Neutrophils Relative %: 48 % (ref 43–77)

## 2010-12-25 DIAGNOSIS — I2109 ST elevation (STEMI) myocardial infarction involving other coronary artery of anterior wall: Secondary | ICD-10-CM

## 2010-12-25 LAB — DIFFERENTIAL
Eosinophils Absolute: 0.1 10*3/uL (ref 0.0–0.7)
Eosinophils Relative: 1 % (ref 0–5)
Lymphs Abs: 1.9 10*3/uL (ref 0.7–4.0)

## 2010-12-25 LAB — POCT CARDIAC MARKERS: Myoglobin, poc: 91.5 ng/mL (ref 12–200)

## 2010-12-25 LAB — COMPREHENSIVE METABOLIC PANEL
ALT: 14 U/L (ref 0–53)
AST: 24 U/L (ref 0–37)
Alkaline Phosphatase: 102 U/L (ref 39–117)
CO2: 25 mEq/L (ref 19–32)
Chloride: 104 mEq/L (ref 96–112)
GFR calc Af Amer: >60 mL/min (ref >60)
GFR calc non Af Amer: >60 mL/min (ref >60)
Potassium: 4 mEq/L (ref 3.5–5.1)
Sodium: 137 mEq/L (ref 135–145)
Total Bilirubin: 0.3 mg/dL (ref 0.3–1.2)

## 2010-12-25 LAB — CBC
Hemoglobin: 10.7 g/dL — ABNORMAL LOW (ref 13.0–17.0)
RBC: 3.57 MIL/uL — ABNORMAL LOW (ref 4.22–5.81)

## 2010-12-25 LAB — GLUCOSE, CAPILLARY
Glucose-Capillary: 147 mg/dL — ABNORMAL HIGH (ref 70–99)
Glucose-Capillary: 126 mg/dL — ABNORMAL HIGH (ref 70–99)
Glucose-Capillary: 121 mg/dL — ABNORMAL HIGH (ref 70–99)

## 2010-12-25 LAB — CARDIAC PANEL(CRET KIN+CKTOT+MB+TROPI)
Relative Index: 5.4 — ABNORMAL HIGH (ref 0.0–2.5)
Total CK: 117 U/L (ref 7–232)
Total CK: 114 U/L (ref 7–232)
Troponin I: 0.94 ng/mL (ref 0.00–0.06)

## 2010-12-25 LAB — LIPID PANEL: HDL: 41 mg/dL (ref >39)

## 2010-12-25 NOTE — Procedures (Signed)
NAME:  [name], [name]               ACCOUNT NO.:  [id number]  MEDICAL RECORD NO.:  [id number]           PATIENT TYPE:  I  LOCATION:  2905                         FACILITY:  [facility]  PHYSICIAN:  Anushka Hartinger M. [place], M.D.  DATE OF BIRTH:  06-26-1927  DATE OF PROCEDURE:  12/24/2010 DATE OF DISCHARGE:                           CARDIAC CATHETERIZATION   INDICATIONS FOR PROCEDURE:  An 77 year old white male presented with acute onset of substernal chest pain of 2 hours duration.  ECG at the [facility], demonstrates acute ST elevation in the anterolateral precordial leads 2 mm.  The patient does have a history of hypertension and diabetes.  Access via the right femoral artery using standard Seldinger technique.  EQUIPMENTS:  A 6-French 4 cm right and left Judkins catheter, 6-French pigtail catheter, 6-French arterial sheath, 6-French XB LAD guide, a Prowater wire, a 2.5 x 15-mm apex balloon, a 3.0 x 24 mm Veriflex stent, a 3.0 x 15-mm [place] Quantum apex balloon.  MEDICATIONS:  Local anesthesia with 1% Xylocaine, fentanyl 25 mcg IV, nitroglycerin 200 mcg intracoronary x2, Angiomax bolus of 0.75 mg/kg followed by continuous infusion of 1.75 mg/kg/hour.  ACT was 431 seconds, Plavix 600 mg p.o., Zofran 4 mg IV, Pepcid 20 mg IV.  Patient also received labetalol 20 mg IV for severe hypertension.  CONTRAST:  150 mL of Omnipaque.  HEMODYNAMIC DATA:  Aortic pressure is 152/71 with a mean of 106, left ventricle pressure is 157 with EDP of 26 mmHg.  ANGIOGRAPHIC DATA:  The left coronary arises and distributes normally. The left main coronary artery is normal.  Left anterior descending artery is a large vessel.  This is somewhat diffusely diseased and calcified moderately in the mid and proximal vessel.  There is a 95% focal stenosis in the midvessel.  Left circumflex coronary is relatively small giving rise to two small marginal branches.  The circumflex is diffusely diseased proximally  up to 40%.  The right coronary is a large dominant vessel with 20-30% disease in the proximal vessel.  We proceeded at this point with emergent stenting of the mid-LAD.  Using the above equipment, the lesion was crossed easily with a guidewire.  We predilated using a 2.5-mm balloon to 4 atmospheres.  We next placed a 3.0 x 24 mm Veriflex stent deploying this at 9 atmospheres.  We postdilated the stent using a 3.0 x 15-mm  Quantum Apex balloon to 14 atmospheres.  This yielded an excellent angiographic result with 0% residual stenosis and TIMI grade 3 flow.  We next performed left ventricular angiography in the RAO view.  This demonstrates apical akinesis with otherwise vigorous contractility. Overall, ejection fraction is estimated 50-55%.  We next removed the sheath in the right groin using Angio-Seal device with excellent hemostasis.  IMPRESSION: 1. Single-vessel obstructive atherosclerotic coronary artery disease. 2. Mild left ventricular dysfunction. 3. Successful stenting of the mid left anterior descending coronary     artery with a bare-metal stent.  PLAN:  We will continue on aspirin and Plavix for 1 year, focus on blood pressure control.  Continue other supportive measures for his myocardial infarction.  ______________________________ Ariyanna Oien M. [place], M.D.     PMJ/MEDQ  D:  12/24/2010  T:  12/25/2010  Job:  [id number]  cc:   Marjory Lies, M.D.  Electronically Signed by Verne Lanuza [place] M.D. on 12/25/2010 10:37:51 AM

## 2010-12-26 LAB — LIPID PANEL
Cholesterol: 142 mg/dL (ref 0–200)
LDL Cholesterol: 60 mg/dL (ref 0–99)
VLDL: 26 mg/dL (ref 0–40)

## 2010-12-26 LAB — CBC
HCT: 36.1 % — ABNORMAL LOW (ref 39.0–52.0)
HCT: 38.3 % — ABNORMAL LOW (ref 39.0–52.0)
Hemoglobin: 12.9 g/dL — ABNORMAL LOW (ref 13.0–17.0)
Hemoglobin: 13.3 g/dL (ref 13.0–17.0)
MCHC: 34.7 g/dL (ref 30.0–36.0)
MCV: 93.9 fL (ref 78.0–100.0)
MCV: 93.2 fL (ref 78.0–100.0)
Platelets: 230 10*3/uL (ref 150–400)
RBC: 4.2 MIL/uL — ABNORMAL LOW (ref 4.22–5.81)
RBC: 4.11 MIL/uL — ABNORMAL LOW (ref 4.22–5.81)
WBC: 7.9 10*3/uL (ref 4.0–10.5)
WBC: 5.4 10*3/uL (ref 4.0–10.5)

## 2010-12-26 LAB — COMPREHENSIVE METABOLIC PANEL
ALT: 161 U/L — ABNORMAL HIGH (ref 0–53)
AST: 336 U/L — ABNORMAL HIGH (ref 0–37)
BUN: 12 mg/dL (ref 6–23)
CO2: 28 mEq/L (ref 19–32)
CO2: 25 mEq/L (ref 19–32)
Calcium: 9.3 mg/dL (ref 8.4–10.5)
Chloride: 99 mEq/L (ref 96–112)
Chloride: 97 mEq/L (ref 96–112)
Creatinine, Ser: 1.1 mg/dL (ref 0.4–1.5)
GFR calc Af Amer: >60 mL/min (ref >60)
GFR calc non Af Amer: >60 mL/min (ref >60)
GFR calc non Af Amer: >60 mL/min (ref >60)
Glucose, Bld: 110 mg/dL — ABNORMAL HIGH (ref 70–99)
Potassium: 4.1 mEq/L (ref 3.5–5.1)
Sodium: 136 mEq/L (ref 135–145)
Total Bilirubin: 1.2 mg/dL (ref 0.3–1.2)
Total Bilirubin: 0.5 mg/dL (ref 0.3–1.2)

## 2010-12-26 LAB — CARDIAC PANEL(CRET KIN+CKTOT+MB+TROPI)
CK, MB: 1.3 ng/mL (ref 0.3–4.0)
CK, MB: 1.7 ng/mL (ref 0.3–4.0)
Relative Index: INVALID (ref 0.0–2.5)
Relative Index: INVALID (ref 0.0–2.5)
Total CK: 60 U/L (ref 7–232)
Total CK: 73 U/L (ref 7–232)
Troponin I: 0.01 ng/mL (ref 0.00–0.06)

## 2010-12-26 LAB — LIPASE, BLOOD: Lipase: 39 U/L (ref 23–300)

## 2010-12-26 LAB — HEMOGLOBIN A1C
Hgb A1c MFr Bld: 6.3 % — ABNORMAL HIGH (ref 4.6–6.1)
Mean Plasma Glucose: 134 mg/dL

## 2010-12-26 LAB — URINALYSIS, ROUTINE W REFLEX MICROSCOPIC
Ketones, ur: NEGATIVE mg/dL
Nitrite: NEGATIVE
Specific Gravity, Urine: 1.015 (ref 1.005–1.030)
Urobilinogen, UA: 1 mg/dL (ref 0.0–1.0)
pH: 7 (ref 5.0–8.0)

## 2010-12-26 LAB — DIFFERENTIAL
Basophils Absolute: 0 10*3/uL (ref 0.0–0.1)
Eosinophils Absolute: 0.1 10*3/uL (ref 0.0–0.7)
Eosinophils Relative: 1 % (ref 0–5)
Lymphocytes Relative: 17 % (ref 12–46)
Lymphs Abs: 1.5 10*3/uL (ref 0.7–4.0)
Lymphs Abs: 1.4 10*3/uL (ref 0.7–4.0)
Monocytes Absolute: 0.4 10*3/uL (ref 0.1–1.0)
Neutrophils Relative %: 76 % (ref 43–77)

## 2010-12-26 LAB — MAGNESIUM: Magnesium: 2.1 mg/dL (ref 1.5–2.5)

## 2010-12-26 LAB — GLUCOSE, CAPILLARY
Glucose-Capillary: 101 mg/dL — ABNORMAL HIGH (ref 70–99)
Glucose-Capillary: 92 mg/dL (ref 70–99)
Glucose-Capillary: 97 mg/dL (ref 70–99)
Glucose-Capillary: 117 mg/dL — ABNORMAL HIGH (ref 70–99)

## 2010-12-26 LAB — BASIC METABOLIC PANEL
BUN: 9 mg/dL (ref 6–23)
Chloride: 102 mEq/L (ref 96–112)
Glucose, Bld: 114 mg/dL — ABNORMAL HIGH (ref 70–99)
Potassium: 3.8 mEq/L (ref 3.5–5.1)

## 2010-12-26 LAB — POCT CARDIAC MARKERS: CKMB, poc: <1 ng/mL — ABNORMAL LOW (ref 1.0–8.0)

## 2010-12-27 DIAGNOSIS — I369 Nonrheumatic tricuspid valve disorder, unspecified: Secondary | ICD-10-CM

## 2010-12-27 LAB — COMPREHENSIVE METABOLIC PANEL
ALT: 86 U/L — ABNORMAL HIGH (ref 0–53)
BUN: 9 mg/dL (ref 6–23)
CO2: 27 mEq/L (ref 19–32)
Calcium: 8.6 mg/dL (ref 8.4–10.5)
Creatinine, Ser: 1.27 mg/dL (ref 0.4–1.5)
GFR calc non Af Amer: 54 mL/min — ABNORMAL LOW (ref >60)
Glucose, Bld: 114 mg/dL — ABNORMAL HIGH (ref 70–99)
Sodium: 134 mEq/L — ABNORMAL LOW (ref 135–145)
Total Protein: 5.4 g/dL — ABNORMAL LOW (ref 6.0–8.3)

## 2010-12-27 LAB — GLUCOSE, CAPILLARY: Glucose-Capillary: 140 mg/dL — ABNORMAL HIGH (ref 70–99)

## 2010-12-28 DIAGNOSIS — I441 Atrioventricular block, second degree: Secondary | ICD-10-CM

## 2010-12-28 LAB — GLUCOSE, CAPILLARY
Glucose-Capillary: 137 mg/dL — ABNORMAL HIGH (ref 70–99)
Glucose-Capillary: 144 mg/dL — ABNORMAL HIGH (ref 70–99)

## 2010-12-29 ENCOUNTER — Inpatient Hospital Stay (HOSPITAL_COMMUNITY): Payer: No Typology Code available for payment source

## 2010-12-29 DIAGNOSIS — I2109 ST elevation (STEMI) myocardial infarction involving other coronary artery of anterior wall: Secondary | ICD-10-CM

## 2010-12-29 LAB — GLUCOSE, CAPILLARY
Glucose-Capillary: 136 mg/dL — ABNORMAL HIGH (ref 70–99)
Glucose-Capillary: 143 mg/dL — ABNORMAL HIGH (ref 70–99)

## 2011-01-01 HISTORY — PX: PACEMAKER PLACEMENT: SHX43

## 2011-01-01 LAB — CBC
HCT: 34 % — ABNORMAL LOW (ref 39.0–52.0)
Hemoglobin: 11.5 g/dL — ABNORMAL LOW (ref 13.0–17.0)
MCHC: 33.8 g/dL (ref 30.0–36.0)
MCHC: 34 g/dL (ref 30.0–36.0)
MCV: 93.3 fL (ref 78.0–100.0)
RBC: 3.55 MIL/uL — ABNORMAL LOW (ref 4.22–5.81)
RBC: 3.65 MIL/uL — ABNORMAL LOW (ref 4.22–5.81)
WBC: 7 10*3/uL (ref 4.0–10.5)

## 2011-01-01 LAB — COMPREHENSIVE METABOLIC PANEL
Albumin: 2.7 g/dL — ABNORMAL LOW (ref 3.5–5.2)
BUN: 11 mg/dL (ref 6–23)
Chloride: 105 mEq/L (ref 96–112)
Creatinine, Ser: 1.12 mg/dL (ref 0.4–1.5)
Total Bilirubin: 0.6 mg/dL (ref 0.3–1.2)

## 2011-01-01 LAB — GLUCOSE, CAPILLARY
Glucose-Capillary: 103 mg/dL — ABNORMAL HIGH (ref 70–99)
Glucose-Capillary: 107 mg/dL — ABNORMAL HIGH (ref 70–99)
Glucose-Capillary: 112 mg/dL — ABNORMAL HIGH (ref 70–99)
Glucose-Capillary: 143 mg/dL — ABNORMAL HIGH (ref 70–99)
Glucose-Capillary: 87 mg/dL (ref 70–99)

## 2011-01-01 LAB — BASIC METABOLIC PANEL
CO2: 28 mEq/L (ref 19–32)
Chloride: 108 mEq/L (ref 96–112)
Creatinine, Ser: 1.06 mg/dL (ref 0.4–1.5)
GFR calc Af Amer: >60 mL/min (ref >60)
Sodium: 142 mEq/L (ref 135–145)

## 2011-01-01 LAB — CARDIAC PANEL(CRET KIN+CKTOT+MB+TROPI)
CK, MB: 0.9 ng/mL (ref 0.3–4.0)
Relative Index: INVALID (ref 0.0–2.5)
Relative Index: INVALID (ref 0.0–2.5)
Total CK: 46 U/L (ref 7–232)
Troponin I: 0.01 ng/mL (ref 0.00–0.06)
Troponin I: 0.02 ng/mL (ref 0.00–0.06)

## 2011-01-01 LAB — PREALBUMIN: Prealbumin: 17.4 mg/dL — ABNORMAL LOW (ref 18.0–45.0)

## 2011-01-02 LAB — D-DIMER, QUANTITATIVE (NOT AT ARMC): D-Dimer, Quant: 0.49 ug/mL-FEU — ABNORMAL HIGH (ref 0.00–0.48)

## 2011-01-02 LAB — POCT CARDIAC MARKERS
CKMB, poc: <1 ng/mL — ABNORMAL LOW (ref 1.0–8.0)
Myoglobin, poc: 85 ng/mL (ref 12–200)
Troponin i, poc: <0.05 ng/mL (ref 0.00–0.09)

## 2011-01-02 LAB — DIFFERENTIAL
Eosinophils Relative: 0 % (ref 0–5)
Lymphocytes Relative: 14 % (ref 12–46)
Lymphs Abs: 1.2 10*3/uL (ref 0.7–4.0)
Monocytes Absolute: 0.4 10*3/uL (ref 0.1–1.0)
Neutro Abs: 7.4 10*3/uL (ref 1.7–7.7)

## 2011-01-02 LAB — URINALYSIS, ROUTINE W REFLEX MICROSCOPIC
Glucose, UA: NEGATIVE mg/dL
Hgb urine dipstick: NEGATIVE
Ketones, ur: 15 mg/dL — AB
Nitrite: NEGATIVE
Protein, ur: NEGATIVE mg/dL
Specific Gravity, Urine: 1.016 (ref 1.005–1.030)
Urobilinogen, UA: 1 mg/dL (ref 0.0–1.0)
pH: 6 (ref 5.0–8.0)

## 2011-01-02 LAB — CARDIAC PANEL(CRET KIN+CKTOT+MB+TROPI)
CK, MB: 0.9 ng/mL (ref 0.3–4.0)
Relative Index: INVALID (ref 0.0–2.5)
Total CK: 46 U/L (ref 7–232)
Troponin I: 0.01 ng/mL (ref 0.00–0.06)

## 2011-01-02 LAB — COMPREHENSIVE METABOLIC PANEL
ALT: 31 U/L (ref 0–53)
AST: 54 U/L — ABNORMAL HIGH (ref 0–37)
Albumin: 3.6 g/dL (ref 3.5–5.2)
Alkaline Phosphatase: 124 U/L — ABNORMAL HIGH (ref 39–117)
BUN: 13 mg/dL (ref 6–23)
CO2: 29 mEq/L (ref 19–32)
Calcium: 9.3 mg/dL (ref 8.4–10.5)
Chloride: 101 mEq/L (ref 96–112)
Creatinine, Ser: 1.2 mg/dL (ref 0.4–1.5)
GFR calc Af Amer: >60 mL/min (ref >60)
GFR calc non Af Amer: 58 mL/min — ABNORMAL LOW (ref >60)
Glucose, Bld: 122 mg/dL — ABNORMAL HIGH (ref 70–99)
Potassium: 4.2 mEq/L (ref 3.5–5.1)
Sodium: 138 mEq/L (ref 135–145)
Total Bilirubin: 0.6 mg/dL (ref 0.3–1.2)
Total Protein: 6.3 g/dL (ref 6.0–8.3)

## 2011-01-02 LAB — GLUCOSE, CAPILLARY: Glucose-Capillary: 164 mg/dL — ABNORMAL HIGH (ref 70–99)

## 2011-01-02 LAB — LIPASE, BLOOD: Lipase: 30 U/L (ref 23–300)

## 2011-01-02 LAB — CBC
HCT: 39.6 % (ref 39.0–52.0)
Hemoglobin: 13.4 g/dL (ref 13.0–17.0)
RBC: 4.22 MIL/uL (ref 4.22–5.81)
WBC: 9.1 10*3/uL (ref 4.0–10.5)

## 2011-01-08 LAB — GLUCOSE, CAPILLARY
Glucose-Capillary: 109 mg/dL — ABNORMAL HIGH (ref 70–99)
Glucose-Capillary: 137 mg/dL — ABNORMAL HIGH (ref 70–99)
Glucose-Capillary: 83 mg/dL (ref 70–99)
Glucose-Capillary: 89 mg/dL (ref 70–99)
Glucose-Capillary: 90 mg/dL (ref 70–99)
Glucose-Capillary: 92 mg/dL (ref 70–99)
Glucose-Capillary: 92 mg/dL (ref 70–99)
Glucose-Capillary: 95 mg/dL (ref 70–99)
Glucose-Capillary: 96 mg/dL (ref 70–99)
Glucose-Capillary: 97 mg/dL (ref 70–99)

## 2011-01-08 LAB — CBC
HCT: 37.5 % — ABNORMAL LOW (ref 39.0–52.0)
MCHC: 33.1 g/dL (ref 30.0–36.0)
MCV: 93.9 fL (ref 78.0–100.0)
MCV: 94.8 fL (ref 78.0–100.0)
Platelets: 172 10*3/uL (ref 150–400)
Platelets: 215 10*3/uL (ref 150–400)
RBC: 4.34 MIL/uL (ref 4.22–5.81)
RDW: 13.6 % (ref 11.5–15.5)
WBC: 10.2 10*3/uL (ref 4.0–10.5)

## 2011-01-08 LAB — CARDIAC PANEL(CRET KIN+CKTOT+MB+TROPI)
CK, MB: 0.9 ng/mL (ref 0.3–4.0)
CK, MB: 0.9 ng/mL (ref 0.3–4.0)
Relative Index: INVALID (ref 0.0–2.5)
Relative Index: INVALID (ref 0.0–2.5)
Relative Index: INVALID (ref 0.0–2.5)
Total CK: 58 U/L (ref 7–232)
Total CK: 77 U/L (ref 7–232)
Total CK: 89 U/L (ref 7–232)
Troponin I: 0.01 ng/mL (ref 0.00–0.06)
Troponin I: 0.02 ng/mL (ref 0.00–0.06)

## 2011-01-08 LAB — LIPASE, BLOOD: Lipase: >2000 U/L — ABNORMAL HIGH (ref 23–300)

## 2011-01-08 LAB — URINALYSIS, ROUTINE W REFLEX MICROSCOPIC
Glucose, UA: NEGATIVE mg/dL
Protein, ur: NEGATIVE mg/dL
Specific Gravity, Urine: 1.017 (ref 1.005–1.030)

## 2011-01-08 LAB — COMPREHENSIVE METABOLIC PANEL
ALT: 101 U/L — ABNORMAL HIGH (ref 0–53)
AST: 151 U/L — ABNORMAL HIGH (ref 0–37)
AST: 446 U/L — ABNORMAL HIGH (ref 0–37)
Albumin: 3.1 g/dL — ABNORMAL LOW (ref 3.5–5.2)
Albumin: 4.1 g/dL (ref 3.5–5.2)
Alkaline Phosphatase: 156 U/L — ABNORMAL HIGH (ref 39–117)
BUN: 12 mg/dL (ref 6–23)
CO2: 31 mEq/L (ref 19–32)
Calcium: 9 mg/dL (ref 8.4–10.5)
Chloride: 96 mEq/L (ref 96–112)
Creatinine, Ser: 1.24 mg/dL (ref 0.4–1.5)
GFR calc Af Amer: >60 mL/min (ref >60)
GFR calc Af Amer: >60 mL/min (ref >60)
GFR calc non Af Amer: 58 mL/min — ABNORMAL LOW (ref >60)
Potassium: 3.9 mEq/L (ref 3.5–5.1)
Sodium: 137 mEq/L (ref 135–145)
Total Bilirubin: 1.3 mg/dL — ABNORMAL HIGH (ref 0.3–1.2)

## 2011-01-08 LAB — DIFFERENTIAL
Basophils Absolute: 0 10*3/uL (ref 0.0–0.1)
Basophils Absolute: 0.1 10*3/uL (ref 0.0–0.1)
Basophils Relative: 1 % (ref 0–1)
Eosinophils Absolute: 0 10*3/uL (ref 0.0–0.7)
Eosinophils Relative: 0 % (ref 0–5)
Eosinophils Relative: 0 % (ref 0–5)
Lymphocytes Relative: 17 % (ref 12–46)
Lymphocytes Relative: 11 % — ABNORMAL LOW (ref 12–46)
Lymphs Abs: 1.3 10*3/uL (ref 0.7–4.0)
Monocytes Absolute: 0.5 10*3/uL (ref 0.1–1.0)
Monocytes Absolute: 0.3 10*3/uL (ref 0.1–1.0)
Neutro Abs: 5.6 10*3/uL (ref 1.7–7.7)

## 2011-01-08 LAB — LIPID PANEL
Cholesterol: 109 mg/dL (ref 0–200)
HDL: 48 mg/dL (ref >39)
LDL Cholesterol: 50 mg/dL (ref 0–99)
Total CHOL/HDL Ratio: 2.3 RATIO
Triglycerides: 57 mg/dL (ref <150)
VLDL: 11 mg/dL (ref 0–40)

## 2011-01-08 LAB — POCT CARDIAC MARKERS
Myoglobin, poc: 50.8 ng/mL (ref 12–200)
Myoglobin, poc: 58.4 ng/mL (ref 12–200)
Troponin i, poc: <0.05 ng/mL (ref 0.00–0.09)

## 2011-01-08 LAB — HEPATIC FUNCTION PANEL
ALT: 110 U/L — ABNORMAL HIGH (ref 0–53)
AST: 64 U/L — ABNORMAL HIGH (ref 0–37)
Bilirubin, Direct: 0.3 mg/dL (ref 0.0–0.3)
Indirect Bilirubin: 0.6 mg/dL (ref 0.3–0.9)
Total Protein: 5.1 g/dL — ABNORMAL LOW (ref 6.0–8.3)

## 2011-01-08 LAB — HEMOGLOBIN A1C: Mean Plasma Glucose: 123 mg/dL

## 2011-01-08 NOTE — Op Note (Signed)
NAME:  [name], [name] NO.:  [id number]  MEDICAL RECORD NO.:  [id number]           PATIENT TYPE:  LOCATION:                                 FACILITY:  PHYSICIAN:  Hillis Range, MD       DATE OF BIRTH:  May 19, 1927  DATE OF PROCEDURE: DATE OF DISCHARGE:                              OPERATIVE REPORT   SURGEON:  Hillis Range, MD  PRIMARY CARDIOLOGIST:  Peter M. [place], MD  PREPROCEDURE DIAGNOSIS:  Mobitz II second-degree arteriovenous block.  POSTPROCEDURE DIAGNOSES:  Mobitz II second-degree arteriovenous block.  PROCEDURE:  Pacemaker implantation.  INTRODUCTION:  [name] is a pleasant 77 year old gentleman with a known history of mild dementia, coronary artery disease, hypertension, and hyperlipidemia who presents for pacemaker implantation.  He initially presented on December 24, 2010, with an acute ST-elevation MI.  He underwent urgent left heart catheterization with stenting of the LAD. He was placed on medical therapy and subsequently was observed to have transient AV dissociation with prolonged RR intervals.  His initial prolongation of his RR interval measured 5 seconds.  There was no precipitating event for this and the patient did have dizziness and presyncope during the episode.  Carvedilol was discontinued though it was felt that he should be on beta-blocker therapy as long-term.  He subsequently was observed to have again transient AV dissociation with a pause of 10 seconds.  He was observed by the nurse to collapse.  It was not completely sure as to whether or note he choked or collapsed first. The patient has dementia and was unable to provide further history. 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  He therefore presents today for pacemaker implantation.  DESCRIPTION OF PROCEDURE:  Informed  written consent was obtained and the patient was brought to the Electrophysiology Lab in the fasting state. He received no sedation for this procedure today as with dementia.  He has previously not tolerated sedation very well.  [name] left chest was prepped and draped in the usual sterile fashion by the EP Lab staff.  The skin overlying the left deltopectoral region was infiltrated with lidocaine for local analgesia.  A 3-cm incision was made over the left deltopectoral region.  A left subcutaneous pacemaker pocket was fashioned using a combination of sharp and blunt dissection. Electrocautery was required to assure hemostasis.  The left cephalic vein was directly visualized and cannulated.  No contrast was required for this endeavor.  Through the left cephalic vein, a St. Jude Medical Tendril STS model [phone] (serial number [id number]) right atrial lead and a St. Jude Medical Isoflex, model [phone] (serial number [id number]) right ventricular pacing lead were advanced with fluoroscopic visualization into the right atrial appendage and right ventricular apex positions respectively.  Pacing threshold was noted to be very high throughout the right atrium.  Finally, there was a site along the lateral wall of the right atrium which would accommodate atrial pacing. In this location, P-waves measured 2.4  mV with an impedance of 597 ohms and a threshold of 2.1 volts at 0.4 milliseconds.  The right ventricular lead R-waves measured 27 mV with an impedance of 751 ohms and a threshold of 0.6 volts at 0.4 milliseconds.  Both leads were therefore secured to the pectoralis fascia using #2 silk suture over the suture sleeves.  The leads were then connected to a St. Jude Medical Accent DR RF, model [id number] (serial number [id number]) pacemaker.  The pocket was irrigated with copious gentamicin solution.  The pacemaker was then placed into the pocket.  The pocket was then closed in 2 layers with  2.0 Vicryl suture for the subcutaneous and subcuticular layers.  Steri- Strips and a sterile dressing were then applied.  There were no early apparent complications.  No contrast was required for the procedure today.  CONCLUSIONS: 1. Successful pacemaker implantation with an Accent DR RF pacemaker. 2. No early apparent complications.     Hillis Range, MD     JA/MEDQ  D:  12/28/2010  T:  12/29/2010  Job:  [id number]  cc:   Peter M. [place], M.D.  Electronically Signed by Hillis Range MD on 01/08/2011 09:22:11 AM

## 2011-01-09 NOTE — Discharge Summary (Signed)
NAME:  Todd, Rich               ACCOUNT NO.:  [id number]  MEDICAL RECORD NO.:  [id number]           PATIENT TYPE:  I  LOCATION:  2006                         FACILITY:  [facility]  PHYSICIAN:  Todd M. Rich, M.D.  DATE OF BIRTH:  03-10-1927  DATE OF ADMISSION:  12/24/2010 DATE OF DISCHARGE:  12/29/2010                              DISCHARGE SUMMARY   PRIMARY CARDIOLOGIST:  Todd M. [place], MD (new).  PRIMARY CARE PHYSICIAN:  Designer, fashion/clothing at [employer].  DISCHARGE DIAGNOSES: 1. Anterolateral ST elevation myocardial infarction/coronary artery     disease.     a.     Cardiac catheterization December 24, 2010:  Single vessel      obstructive atherosclerotic coronary artery disease with 95% focal      stenosis in mid LAD.  Successful stenting of the mid LAD with 3.0      x 24 mm Veriflex BMS.  Mild left ventricular dysfunction with      apical akinesis, but normal LVEF at 50 - 55%. 2. Ischemic cardiomyopathy.     a.     2D echocardiogram December 27, 2010:  Hypokinesis of the apex,      otherwise wall motion normal, left ventricular cavity size normal,      mild left ventricular hypertrophy, LVEF 45%, grade 1 diastolic      dysfunction. 3. Elevated transaminases.     a.     Peak alkaline phosphatase 257, AST 336, ALT 161, down      trending on recheck to 202, 75, and 86 respectively. 4. Confusion/dementia.     a.     Most notable on December 26, 2010, significantly improved and      continued to improve as of December 27, 2010. 5. Syncope secondary to Mobitz II second-degree atrioventricular     block.     a.     Status post St. Jude Accent DR RF pacemaker December 28, 2010,      without early apparent complications. 6. Accelerated hypertension.     a.     Initiated on Norvasc 5 mg p.o. b.i.d. with significant      improvement in blood pressure control.  SECONDARY DIAGNOSES: 1. Non-insulin-dependent diabetes mellitus. 2. Dyslipidemia. 3. Recurrent gallstone pancreatitis (last episode of  October 2011).     a.     Status post cholecystectomy in 2007. 4. Morbid obesity. 5. History of transient ischemic attacks. 6. Hearing-impaired 7. Gastroesophageal reflux disease.  ALLERGIES AND INTOLERANCES: 1. PENICILLIN (rash) 2. STATINS (the patient reports intolerances and ?specifics). 3. DOXYCYCLINE (unknown reaction).  PROCEDURES: 1. EKG December 24, 2010:  NSR, 73 bpm, 1-2 mm ST elevation in V3-V5 with     question of significant Q-waves in V3 and V4, but not new from     tracing completed on December 24, 2010.  Of note, ST elevation is new.     Old left axis deviation, PR 184, QRS 92, QTc 420. 2. Chest x-ray December 24, 2010:  No active disease, but degenerative     changes in the acromioclavicular joints bilaterally. 3. Cardiac catheterization December 24, 2010:  Please see  discharge     diagnoses, anterolateral ST elevation myocardial     infarction/coronary artery disease, most notable on December 26, 2010,     significantly improved and continued to improve as of December 27, 2010.  Note, left circumflex relatively small giving rise two small     marginal branches with up to 40% in the circumflex artery.  RCA     large dominant vessel with 20-30% disease in proximal vessel.  Left     main artery is normal. 4. EKG December 25, 2010:  NSR, 81 bpm, 1 mm of ST elevation in V3 and V4     with no other specific ST changes, no significant Q-waves, left     axis deviation, PR 188, QRS 96 and QTc 460. 5. EKG December 28, 2010:  2-3 mm of ST elevation in V2-V4 with 1 mm of ST     elevation in V5 with no significant Q-waves or other significant     changes from prior tracing. 6. Successful implantation without early apparent complications. 7. EKG December 29, 2010:  No significant changes from tracing completed     on December 28, 2010. 8. Chest x-ray December 29, 2010:  Pacer wires were in good position     without complicating features.  No acute pulmonary findings. 9. 2D echocardiogram December 27, 2010:  Please see  discharge diagnoses,     ischemic cardiomyopathy.  HISTORY OF PRESENT ILLNESS:  [name] is an 77 year old Caucasian gentleman with the above-noted complex medical history who presented to [facility] after having acute onset of chest discomfort.  At [facility], he was noted to have ST elevation and was significantly hypertensive with systolic blood pressure in the 180s, was treated with IV heparin, IV nitroglycerin, morphine 2 mg IV x1, four baby aspirin, and was set up for transfer to [facility]/cath lab for emergent cardiac catheterization.  En route, the patient had oxygen by nasal cannula, was given 4 mg of IV morphine x1.  When he reached cardiac cath lab, he continued to have discomfort, but it had significantly improved.  It was difficult to get full history/review of systems on the patient as he is very hard of hearing.  Risks and benefits of cardiac catheterization were shared with the patient, and he indicated that he understood and was willing to proceed.  HOSPITAL COURSE:  The patient was admitted and underwent procedures as described above.  He tolerated them well without significant complications.  He had no further chest discomfort for the duration of his hospital stay, but his hospital course was complicated by elevated transaminases as well as confusion on December 26, 2010.  He had these rechecked on the following day, and they were down trending.  His confusion was also significantly improved on that day.  Unfortunately, the patient had 5-second pause without symptoms on December 28, 2010, in the early morning.  This was thought to be associated with coughing, was originally thought to be results of a vagal episode.  Unfortunately, the patient had another episode of significant pause this time lasting 10 seconds and associated with syncope.  Electrophysiology was consulted, and it was their determination that the patient had intermittent Mobitz II  second-degree AV block and with a clear benefit beta blockade therapy in the setting was recent MI, felt that he was appropriate for pacemaker implantation.  The patient was given risks and benefits of this procedure and opted to proceed.  The patient  then had St. Jude dual- chamber pacemaker implantation with an Accent DR RF pacemaker implanted on December 28, 2010.  He was kept overnight for observation and then seen by his electrophysiologist Hillis Range on the morning of December 29, 2010. He was deemed stable for discharge with meds as outlined in the discharge med section, and follow up as outlined in the followup section.  At the time of discharge, the patient received his new medication list, prescriptions, followup instructions, postcath/post pacemaker instructions.  All questions and concerns were addressed prior to leaving the hospital.  DISCHARGE LABORATORY DATA:  WBC 7.9, HGB 12.9, HCT 38.1, PLT count is 246, WBC differential was within normal limits on two checks.  Protime 11.6, INR 0.83.  Sodium 134, potassium 3.6, chloride 100, BUN 9, creatinine 1.27, total bilirubin 0.9, alkaline phosphatase 202 down from peak of 257, AST 75 down from peak of 336, ALT 86 down from peak of 161, total protein 5.4, albumin 2.8, calcium 8.6, hemoglobin A1c 6.1%.  Point- of-care markers negative.  First full set of enzymes with CK 117, MB 6.3, relative index 5.4, troponin 0.94.  Second set CK 114, MB 6.0, relative index 5.3, and troponin 0.68.  Total cholesterol 161, triglycerides 112,  HDL 41, LDL 98, total cholesterol/HDL ratio 3.9.  FOLLOWUP PLANS AND APPOINTMENTS: 1. Wound check/pacer clinic appointment at [facility] office in 10 days. 2. Dr. Peter Rich in [facility] in 2 weeks. 3. Dr. Hillis Range at [facility] office in 3     months.  DISCHARGE MEDICATIONS: 1. Acetaminophen 325 mg 1-2 tablets p.o. q.4 h. p.r.n. 2. Amlodipine 5 mg 1  tablet p.o. b.i.d. 3. Carvedilol 12.5 mg 1 tablet p.o. b.i.d. with meals. 4. Magnesium oxide 400 mg 1 tablet p.o. daily. 5. Sublingual nitroglycerin 1 tablet sublingual q.5 minutes up to 3     doses p.r.n. for chest discomfort. 6. Simvastatin 80 mg 1/2 tablet p.o. Mondays, Wednesdays, and Fridays. 7. Aricept 5 mg 1 tablet p.o. at bedtime. 8. Enteric-coated aspirin 325 mg 1 tablet p.o. daily. 9. Benazepril 20 mg 1 tablet p.o. daily. 10.Fesoterodine fumarate 4 mg 1 tablet p.o. daily. 11.Hydrochlorothiazide 25 mg 1/2 tablet p.o. daily. 12.Metformin 500 mg 1 tablet p.o. b.i.d. 13.Multivitamin 1 tablet p.o. daily. 14.Omeprazole 20 mg 1 capsule p.o. b.i.d.  DURATION OF DISCHARGE ENCOUNTER INCLUDING PHYSICIAN TIME:  45 minutes.     Jarrett Ables, PAC   ______________________________ Todd M. Rich, M.D.    MS/MEDQ  D:  12/29/2010  T:  12/30/2010  Job:  [id number]  cc:   Hillis Range, MD Todd M. Rich, M.D. [facility]  Electronically Signed by Jarrett Ables PAC on 01/02/2011 11:08:42 AM Electronically Signed by [name] M.D. on 01/09/2011 03:14:40 PM

## 2011-01-09 NOTE — H&P (Signed)
NAME:  [name], [name]               ACCOUNT NO.:  [id number]  MEDICAL RECORD NO.:  [id number]           PATIENT TYPE:  I  LOCATION:  2905                         FACILITY:  [facility]  PHYSICIAN:  Minh Roanhorse M. [place], M.D.  DATE OF BIRTH:  [birth year]  DATE OF ADMISSION:  12/24/2010 DATE OF DISCHARGE:                             HISTORY & PHYSICAL   The patient is new to Cardiology, but follows at [facility] for primary care.  CHIEF COMPLAINT:  Chest discomfort.  HISTORY OF PRESENT ILLNESS:  [name] is an 77 year old Caucasian gentleman with no known history of coronary artery disease, but CAD equivalent of diabetes mellitus, risk factors of hypertension, hyperlipidemia, and morbid obesity, as well as history significant for recurrent gallstone pancreatitis even after cholecystectomy in 2007 with last episode of pancreatitis in October 2011, history of TIAs and history of Enterococcus bacteremia, who presents with acute onset of chest discomfort, subsequently found to have new ST-elevation in leads V3 through V5 at [facility].  The patient was in his usual state of health until approximately 2:30 p.m. when he had sudden onset of substernal chest discomfort.  The patient describes this is different from any prior symptoms he has had, and as it continuous, this was quite severe.  He presented to the [facility] for further evaluation.  There EKG showed new ST-elevation in V3 through V4, but normal sinus rhythm, and no other changes noted.  He was significantly hypertensive with systolic blood pressure in the 180s and was treated with IV heparin, IV nitroglycerin, morphine 2 mg x1, four baby aspirin, and arrangements were made to transfer him to [facility] cath lab for emergent catheterization.  When the patient arrived at the [facility], cardiac cath lab, his symptoms had significantly improvement after meds given at [facility] as well as 4 mg of morphine and  oxygen by nasal cannula en route.  He was still having similar chest discomfort, but not nearly as severe.  The patient is very hard of hearing and it is difficult to get a detailed history from him.  Risks and benefits of cardiac catheterization were shared with him and he did indicate he understood and was willing to proceed.  PAST MEDICAL HISTORY: 1. Non-insulin-dependent diabetes mellitus. 2. Hypertension. 3. Hyperlipidemia. 4. History of recurrent gallstone pancreatitis (last episode October     2011).     a.     Cholecystectomy in 2007. 5. Morbid obesity. 6. History of TIAs. 7. Hearing impaired. 8. GERD.  SOCIAL HISTORY:  The patient is married and has no significant tobacco, EtOH, or illicit drug use history.  Regular diet.  No regular exercise, but is active.  FAMILY HISTORY:  Noncontributory secondary to the patient's advanced age.  REVIEW OF SYSTEMS:  Please see HPI.  All other systems were reviewed as best they could be given the patient's significant hearing impairment and were negative.  The patient reports feeling "sick" when he lies flat, but denies orthopnea.  CODE STATUS:  Full.  ALLERGIES AND INTOLERANCES: 1. PENICILLIN (rash). 2. STATINS (unknown reaction). 3. DOXYCYCLINE (unknown reaction).  MEDICATIONS: 1. Metformin  500 mg p.o. b.i.d. 2. Metoprolol 25 mg p.o. b.i.d. 3. Benazepril 40 mg p.o. daily. 4. Enteric-coated aspirin 325 mg p.o. daily. 5. Donepezil 5 mg 1 tablet p.o. nightly. 6. Magnesium 500 mg 1 tablet p.o. daily. 7. Multivitamin 1 tablet daily. 8. Omeprazole 20 mg 1 capsule p.o. b.i.d. 9. At [facility], the patient started on heparin drip, four     baby aspirin given, nitro drip, 2 mg IV morphine x1 en route to     [facility], given O2 and 4 mg IV morphine x1.  PHYSICAL EXAMINATION:  VITAL SIGNS:  Pulse in the 70s to 80s, BP initially in the 180 systolic at [facility], now 150s/70s, respiration rate in the 20s, O2  saturation 100% on 2 liters nasal cannula. GENERAL:  The patient is alert and oriented x3.  He is in mild distress, but no respiratory distress on 2 liters by nasal cannula, including with full sentences. HEENT:  His head is normocephalic, atraumatic.  Pupils are equal, round, and reactive to light.  Extraocular muscles are intact.  Nares are patent.  Dentition is poor.  Oropharynx without erythema or exudates. NECK:  Supple without lymphadenopathy.  No JVD. HEART:  Rate is regular with audible S1-S2.  Pulses are 2+ and equal in both upper and lower extremities bilaterally. LUNGS:  Clear to auscultation bilaterally. SKIN:  No rashes, lesions, or petechiae. ABDOMEN:  Soft, nontender, nondistended.  Normal abdominal bowel sounds. No rebound or guarding.  No hepatosplenomegaly.  The patient is obese. EXTREMITIES:  With no clubbing or cyanosis, but 1+ pitting edema in lower extremities bilaterally. MUSCULOSKELETAL:  Without joint deformity or effusions.  No spinal or CVA tenderness. NEURO:  Cranial nerves II-XII grossly intact (cranial nerve VIII chronically impaired).  Strength 5/5 in all extremities and axial groups.  Normal sensation throughout.  Normal cerebellar function (as assessed in the hospital bed only).  RADIOLOGY:  Chest x-ray showed no active disease, but degenerative changes in acromioclavicular joints bilaterally.  The patient had negative Myoview in December 2010.  EKG, normal sinus rhythm, 73 bpm, 1-2 mm of ST-elevation in V3-V5, with question of significant Q-waves in D3 and V4, but not new from tracing completed in January of this year.  Of note, ST-elevation is new.  The patient has old left axis deviation.  PR 184, QRS 92, and QTC of 420.  LABS:  WBC is 8.0, HGB 14.1, HCT 41.3, PLT count 255.  Sodium 142, potassium 5.3, chloride 106, bicarb 26, BUN 13, creatinine 1.1, glucose 143.  Pro-time 11.6.  INR 0.83.  ASSESSMENT AND PLAN:  The patient was seen by both  Jarrett Ables, P-AC and attending cardiologist/interventionalist Dr. Emerson Schreifels [place].  Mr. Blough is an 77 year old Caucasian gentleman with no known history of coronary artery disease, coronary artery disease equivalent of diabetes mellitus, risk factors of hypertension, hyperlipidemia, and morbid obesity, who presents with acute anteroseptal ST-segment elevation myocardial infarction, currently undergoing emergent cardiac catheterization.  ST-segment elevation myocardial infarction - Brief look at cardiac cath films during catheterization shows 90+ percent blockage in the mid LAD. The patient will likely require stenting and if so will require dual antiplatelet therapy, but for now we will only order full strength aspirin daily and defer to interventional cardiologist for other antiplatelet medication.  We will initiate beta-blockade therapy with Lopressor 25 mg p.o. q.8 h. and see if he tolerates this dose (slightly above home dose).  Given statin listed on allergies and intolerance but with no known reaction,  we will initiate Crestor 5 mg p.o. every Monday, Wednesday, Friday to determine if the patient can tolerate even this extremely low-dose statin therapy.  We will continue other home medications including benazepril 40 mg p.o. daily, magnesium oxide daily, donepezil, and we will substitute Protonix for Nexium.  The patient will be admitted to the intensive care unit post catheterization and other plans deferred to interventional cardiologist with the benefit of objective information from cardiac catheterization.  We will recheck renal function, electrolytes, lipids, HbA1c, CBC in a.m.  We will check a PT/INR now.     Jarrett Ables, PAC   ______________________________ Ellanie Oppedisano M. [place], M.D.    MS/MEDQ  D:  12/24/2010  T:  12/25/2010  Job:  [id number]  Electronically Signed by Jarrett Ables PAC on 01/02/2011 11:08:29 AM Electronically Signed by Angelina Venard [place] M.D. on  01/09/2011 03:14:47 PM

## 2011-01-11 ENCOUNTER — Ambulatory Visit (INDEPENDENT_AMBULATORY_CARE_PROVIDER_SITE_OTHER): Payer: No Typology Code available for payment source | Admitting: *Deleted

## 2011-01-11 DIAGNOSIS — I441 Atrioventricular block, second degree: Secondary | ICD-10-CM

## 2011-01-12 ENCOUNTER — Encounter: Payer: Self-pay | Admitting: Cardiology

## 2011-01-12 DIAGNOSIS — R7881 Bacteremia: Secondary | ICD-10-CM | POA: Insufficient documentation

## 2011-01-12 DIAGNOSIS — I2510 Atherosclerotic heart disease of native coronary artery without angina pectoris: Secondary | ICD-10-CM | POA: Insufficient documentation

## 2011-01-12 DIAGNOSIS — E119 Type 2 diabetes mellitus without complications: Secondary | ICD-10-CM | POA: Insufficient documentation

## 2011-01-12 DIAGNOSIS — R0789 Other chest pain: Secondary | ICD-10-CM | POA: Insufficient documentation

## 2011-01-12 DIAGNOSIS — I10 Essential (primary) hypertension: Secondary | ICD-10-CM | POA: Insufficient documentation

## 2011-01-12 DIAGNOSIS — K851 Biliary acute pancreatitis without necrosis or infection: Secondary | ICD-10-CM | POA: Insufficient documentation

## 2011-01-12 DIAGNOSIS — K219 Gastro-esophageal reflux disease without esophagitis: Secondary | ICD-10-CM | POA: Insufficient documentation

## 2011-01-12 DIAGNOSIS — H9190 Unspecified hearing loss, unspecified ear: Secondary | ICD-10-CM | POA: Insufficient documentation

## 2011-01-12 DIAGNOSIS — E785 Hyperlipidemia, unspecified: Secondary | ICD-10-CM | POA: Insufficient documentation

## 2011-01-12 DIAGNOSIS — G459 Transient cerebral ischemic attack, unspecified: Secondary | ICD-10-CM | POA: Insufficient documentation

## 2011-01-15 ENCOUNTER — Encounter: Payer: Self-pay | Admitting: Cardiology

## 2011-01-15 ENCOUNTER — Ambulatory Visit (INDEPENDENT_AMBULATORY_CARE_PROVIDER_SITE_OTHER): Payer: No Typology Code available for payment source | Admitting: Cardiology

## 2011-01-15 DIAGNOSIS — I2109 ST elevation (STEMI) myocardial infarction involving other coronary artery of anterior wall: Secondary | ICD-10-CM

## 2011-01-15 DIAGNOSIS — I252 Old myocardial infarction: Secondary | ICD-10-CM | POA: Insufficient documentation

## 2011-01-15 DIAGNOSIS — I441 Atrioventricular block, second degree: Secondary | ICD-10-CM | POA: Insufficient documentation

## 2011-01-15 DIAGNOSIS — I10 Essential (primary) hypertension: Secondary | ICD-10-CM

## 2011-01-15 DIAGNOSIS — E785 Hyperlipidemia, unspecified: Secondary | ICD-10-CM

## 2011-01-15 NOTE — Progress Notes (Signed)
[name] Date of Birth: 1927/01/29   History of Present Illness: [name] is seen for followup after recent acute anterior myocardial infarction. He underwent emergent stenting of the mid LAD with a bare-metal stent. He had mild to moderate left ventricular dysfunction. He did well from a coronary standpoint following this procedure but subsequently developed symptomatic second-degree AV block. He underwent permanent pacemaker implant by Dr. Johney Frame. On followup today he is doing very well. He has had no chest pain, shortness of breath, palpitations, or dizziness. He's had no complications from his pacemaker pocket or catheter site.  Current Outpatient Prescriptions on File Prior to Visit  Medication Sig Dispense Refill  . acetaminophen (TYLENOL) 325 MG tablet Take 650 mg by mouth as needed.        Marland Kitchen amLODipine (NORVASC) 5 MG tablet Take 5 mg by mouth 2 (two) times daily.        Marland Kitchen aspirin 325 MG EC tablet Take 325 mg by mouth daily.        . benazepril (LOTENSIN) 20 MG tablet Take 20 mg by mouth daily.        . carvedilol (COREG) 12.5 MG tablet Take 12.5 mg by mouth 2 (two) times daily with a meal.        . clopidogrel (PLAVIX) 75 MG tablet Take 75 mg by mouth daily.        Marland Kitchen donepezil (ARICEPT) 5 MG tablet Take 5 mg by mouth at bedtime.        . fesoterodine (TOVIAZ) 4 MG TB24 Take 4 mg by mouth daily.        . hydrochlorothiazide 25 MG tablet Take 12.5 mg by mouth daily.        . magnesium oxide (MAG-OX) 400 MG tablet Take 400 mg by mouth daily.        . metformin (FORTAMET) 500 MG (OSM) 24 hr tablet Take 500 mg by mouth 2 (two) times daily with a meal.        . Multiple Vitamins-Minerals (CENTRUM SILVER PO) Take by mouth daily.        . nitroGLYCERIN (NITROSTAT) 0.4 MG SL tablet Place 0.4 mg under the tongue every 5 (five) minutes as needed.        Marland Kitchen omeprazole (PRILOSEC) 20 MG capsule Take 20 mg by mouth 2 (two) times daily.        . simvastatin (ZOCOR) 80 MG tablet Take 40 mg  by mouth at bedtime. Monday, Wednesday and Friday only         Allergies  Allergen Reactions  . Doxycycline   . Penicillins     Past Medical History  Diagnosis Date  . Chest discomfort   . Coronary artery disease   . Diabetes mellitus   . Hypertension   . Hyperlipidemia   . Obesities, morbid   . Gallstone pancreatitis     recurrent  . TIA (transient ischemic attack)   . Bacteremia due to vancomycin resistant Enterococcus   . GERD (gastroesophageal reflux disease)   . Hearing impairment   . Myocardial infarction, anterior wall, subsequent care   . Second degree AV block, Mobitz type II     Past Surgical History  Procedure Date  . Cholecystectomy   . Pacemaker placement     History  Smoking status  . Former Smoker  . Quit date: 01/12/1996  Smokeless tobacco  . Not on file    History  Alcohol Use No    History reviewed. No  pertinent family history.  Review of Systems: The review of systems is positive for mild tenderness at his pacemaker site.  All other systems were reviewed and are negative.  Physical Exam: BP 120/78  Pulse 78  Wt 191 lb (86.637 kg) He is an elderly obese white male in no acute distress. HEENT exam reveals that he is very hard of hearing. Pupils are equal round and reactive. Sclera are clear. Oropharynx is clear. Neck supple without JVD, adenopathy, thyromegaly, or bruits. Lungs are clear. Cardiac exam reveals a regular rate and rhythm without gallop, murmur, or click. His pacemaker site has healed well. Abdomen is soft and nontender. He has no masses or bruits. Femoral and pedal pulses are 2+. He has no groin hematoma. There is no significant edema. Besides his hearing loss he has normal examinations cranial nerves.  Assessment / Plan:

## 2011-01-15 NOTE — Assessment & Plan Note (Signed)
Blood pressure is adequately controlled. Will continue on antihypertensive therapy.

## 2011-01-15 NOTE — Patient Instructions (Signed)
Continue your current medications.  Continue ASA and Plavix for at least 1 year.  Continue with your pacemaker follow up with Dr. Johney Frame  We will see you for an office visit in 2 months and check fasting lab work then.

## 2011-01-15 NOTE — Assessment & Plan Note (Signed)
Patient is recovering nicely from his myocardial infarction. I've encouraged him to participate in cardiac rehabilitation. We'll increase his activity gradually. We'll continue with his current medical therapy. I will recommend he stay on aspirin and Plavix for at least one year.

## 2011-01-15 NOTE — Assessment & Plan Note (Signed)
Status post DDD pacemaker implant. I will keep scheduled pacemaker follow up with Dr. Johney Frame.

## 2011-01-15 NOTE — Assessment & Plan Note (Signed)
Continue his statin therapy. He will need followup lab work in approximately 2 months.

## 2011-01-18 ENCOUNTER — Emergency Department (INDEPENDENT_AMBULATORY_CARE_PROVIDER_SITE_OTHER): Payer: No Typology Code available for payment source

## 2011-01-18 ENCOUNTER — Emergency Department (HOSPITAL_BASED_OUTPATIENT_CLINIC_OR_DEPARTMENT_OTHER)
Admission: EM | Admit: 2011-01-18 | Discharge: 2011-01-18 | Disposition: A | Discharge status: Nursing Home (Includes ICF) | Payer: No Typology Code available for payment source | Source: Home / Self Care | Attending: Emergency Medicine | Admitting: Emergency Medicine

## 2011-01-18 DIAGNOSIS — R079 Chest pain, unspecified: Secondary | ICD-10-CM

## 2011-01-18 DIAGNOSIS — J449 Chronic obstructive pulmonary disease, unspecified: Secondary | ICD-10-CM | POA: Insufficient documentation

## 2011-01-18 DIAGNOSIS — E7800 Pure hypercholesterolemia, unspecified: Secondary | ICD-10-CM | POA: Insufficient documentation

## 2011-01-18 DIAGNOSIS — Z79899 Other long term (current) drug therapy: Secondary | ICD-10-CM | POA: Insufficient documentation

## 2011-01-18 DIAGNOSIS — K5900 Constipation, unspecified: Secondary | ICD-10-CM

## 2011-01-18 DIAGNOSIS — R109 Unspecified abdominal pain: Secondary | ICD-10-CM

## 2011-01-18 DIAGNOSIS — J4489 Other specified chronic obstructive pulmonary disease: Secondary | ICD-10-CM | POA: Insufficient documentation

## 2011-01-18 DIAGNOSIS — R1013 Epigastric pain: Secondary | ICD-10-CM | POA: Insufficient documentation

## 2011-01-18 DIAGNOSIS — E109 Type 1 diabetes mellitus without complications: Secondary | ICD-10-CM | POA: Insufficient documentation

## 2011-01-18 LAB — DIFFERENTIAL
Basophils Absolute: 0 10*3/uL (ref 0.0–0.1)
Eosinophils Absolute: 0 10*3/uL (ref 0.0–0.7)
Eosinophils Relative: 1 % (ref 0–5)
Lymphocytes Relative: 10 % — ABNORMAL LOW (ref 12–46)
Lymphs Abs: 0.8 10*3/uL (ref 0.7–4.0)
Neutrophils Relative %: 83 % — ABNORMAL HIGH (ref 43–77)

## 2011-01-18 LAB — BASIC METABOLIC PANEL
Chloride: 102 mEq/L (ref 96–112)
GFR calc non Af Amer: >60 mL/min (ref >60)
Potassium: 4.2 mEq/L (ref 3.5–5.1)
Sodium: 139 mEq/L (ref 135–145)

## 2011-01-18 LAB — URINALYSIS, ROUTINE W REFLEX MICROSCOPIC
Bilirubin Urine: NEGATIVE
Glucose, UA: NEGATIVE mg/dL
Hgb urine dipstick: NEGATIVE
Ketones, ur: NEGATIVE mg/dL
Nitrite: NEGATIVE
Protein, ur: NEGATIVE mg/dL
Specific Gravity, Urine: 1.013 (ref 1.005–1.030)
Urobilinogen, UA: 1 mg/dL (ref 0.0–1.0)
pH: 8 (ref 5.0–8.0)

## 2011-01-18 LAB — CBC
HCT: 34.9 % — ABNORMAL LOW (ref 39.0–52.0)
Platelets: 200 10*3/uL (ref 150–400)
RBC: 3.85 MIL/uL — ABNORMAL LOW (ref 4.22–5.81)
RDW: 12.9 % (ref 11.5–15.5)
WBC: 8.8 10*3/uL (ref 4.0–10.5)

## 2011-01-18 LAB — POCT CARDIAC MARKERS: Myoglobin, poc: 47.4 ng/mL (ref 12–200)

## 2011-01-19 ENCOUNTER — Inpatient Hospital Stay (HOSPITAL_COMMUNITY)
Admission: AD | Admit: 2011-01-19 | Discharge: 2011-01-21 | DRG: 392 | Disposition: A | Discharge status: Home / Self Care | Payer: No Typology Code available for payment source | Source: Other Acute Inpatient Hospital | Attending: Cardiology | Admitting: Cardiology

## 2011-01-19 DIAGNOSIS — R109 Unspecified abdominal pain: Secondary | ICD-10-CM

## 2011-01-19 DIAGNOSIS — E785 Hyperlipidemia, unspecified: Secondary | ICD-10-CM | POA: Diagnosis present

## 2011-01-19 DIAGNOSIS — Z8673 Personal history of transient ischemic attack (TIA), and cerebral infarction without residual deficits: Secondary | ICD-10-CM

## 2011-01-19 DIAGNOSIS — K5900 Constipation, unspecified: Principal | ICD-10-CM | POA: Diagnosis present

## 2011-01-19 DIAGNOSIS — E669 Obesity, unspecified: Secondary | ICD-10-CM | POA: Diagnosis present

## 2011-01-19 DIAGNOSIS — I359 Nonrheumatic aortic valve disorder, unspecified: Secondary | ICD-10-CM

## 2011-01-19 DIAGNOSIS — K219 Gastro-esophageal reflux disease without esophagitis: Secondary | ICD-10-CM | POA: Diagnosis present

## 2011-01-19 DIAGNOSIS — Z9861 Coronary angioplasty status: Secondary | ICD-10-CM

## 2011-01-19 DIAGNOSIS — Z950 Presence of cardiac pacemaker: Secondary | ICD-10-CM

## 2011-01-19 DIAGNOSIS — I10 Essential (primary) hypertension: Secondary | ICD-10-CM | POA: Diagnosis present

## 2011-01-19 DIAGNOSIS — Z7982 Long term (current) use of aspirin: Secondary | ICD-10-CM

## 2011-01-19 DIAGNOSIS — I2589 Other forms of chronic ischemic heart disease: Secondary | ICD-10-CM | POA: Diagnosis present

## 2011-01-19 DIAGNOSIS — E119 Type 2 diabetes mellitus without complications: Secondary | ICD-10-CM | POA: Diagnosis present

## 2011-01-19 DIAGNOSIS — Z79899 Other long term (current) drug therapy: Secondary | ICD-10-CM

## 2011-01-19 DIAGNOSIS — I2510 Atherosclerotic heart disease of native coronary artery without angina pectoris: Secondary | ICD-10-CM | POA: Diagnosis present

## 2011-01-19 DIAGNOSIS — I2109 ST elevation (STEMI) myocardial infarction involving other coronary artery of anterior wall: Secondary | ICD-10-CM | POA: Diagnosis present

## 2011-01-19 LAB — CARDIAC PANEL(CRET KIN+CKTOT+MB+TROPI)
CK, MB: 1.7 ng/mL (ref 0.3–4.0)
CK, MB: 1.6 ng/mL (ref 0.3–4.0)
CK, MB: 1.5 ng/mL (ref 0.3–4.0)
Total CK: 36 U/L (ref 7–232)
Troponin I: 0.03 ng/mL (ref 0.00–0.06)
Troponin I: 0.04 ng/mL (ref 0.00–0.06)

## 2011-01-19 LAB — GLUCOSE, CAPILLARY: Glucose-Capillary: 115 mg/dL — ABNORMAL HIGH (ref 70–99)

## 2011-01-19 LAB — HEPATIC FUNCTION PANEL
AST: 136 U/L — ABNORMAL HIGH (ref 0–37)
Albumin: 3 g/dL — ABNORMAL LOW (ref 3.5–5.2)
Total Bilirubin: 0.6 mg/dL (ref 0.3–1.2)
Total Protein: 5.6 g/dL — ABNORMAL LOW (ref 6.0–8.3)

## 2011-01-19 LAB — LIPID PANEL
HDL: 54 mg/dL (ref >39)
VLDL: 17 mg/dL (ref 0–40)

## 2011-01-19 LAB — TSH: TSH: 1.522 u[IU]/mL (ref 0.350–4.500)

## 2011-01-20 DIAGNOSIS — R079 Chest pain, unspecified: Secondary | ICD-10-CM

## 2011-01-20 LAB — GLUCOSE, CAPILLARY
Glucose-Capillary: 121 mg/dL — ABNORMAL HIGH (ref 70–99)
Glucose-Capillary: 122 mg/dL — ABNORMAL HIGH (ref 70–99)
Glucose-Capillary: 116 mg/dL — ABNORMAL HIGH (ref 70–99)
Glucose-Capillary: 112 mg/dL — ABNORMAL HIGH (ref 70–99)

## 2011-01-20 LAB — COMPREHENSIVE METABOLIC PANEL
Albumin: 3 g/dL — ABNORMAL LOW (ref 3.5–5.2)
BUN: 11 mg/dL (ref 6–23)
Calcium: 9.1 mg/dL (ref 8.4–10.5)
Creatinine, Ser: 1.09 mg/dL (ref 0.4–1.5)
Glucose, Bld: 123 mg/dL — ABNORMAL HIGH (ref 70–99)
Total Protein: 6 g/dL (ref 6.0–8.3)

## 2011-01-20 LAB — LIPASE, BLOOD: Lipase: 21 U/L (ref 11–59)

## 2011-01-20 LAB — AMYLASE: Amylase: 49 U/L (ref 0–105)

## 2011-01-21 DIAGNOSIS — R109 Unspecified abdominal pain: Secondary | ICD-10-CM

## 2011-01-21 LAB — HEPATIC FUNCTION PANEL
ALT: 58 U/L — ABNORMAL HIGH (ref 0–53)
Albumin: 3.1 g/dL — ABNORMAL LOW (ref 3.5–5.2)
Alkaline Phosphatase: 196 U/L — ABNORMAL HIGH (ref 39–117)
Indirect Bilirubin: 0.5 mg/dL (ref 0.3–0.9)
Total Bilirubin: 0.7 mg/dL (ref 0.3–1.2)

## 2011-01-21 LAB — GLUCOSE, CAPILLARY: Glucose-Capillary: 112 mg/dL — ABNORMAL HIGH (ref 70–99)

## 2011-01-21 NOTE — H&P (Addendum)
NAME:  [name], [name]               ACCOUNT NO.:  [id number]  MEDICAL RECORD NO.:  [id number]           PATIENT TYPE:  I  LOCATION:  3704                         FACILITY:  [facility]  PHYSICIAN:  Peter M. [place], M.D.  DATE OF BIRTH:  1927/09/12  DATE OF ADMISSION:  01/19/2011 DATE OF DISCHARGE:                             HISTORY & PHYSICAL   CHIEF COMPLAINT:  Abdominal pain and constipation.  HISTORY OF PRESENT ILLNESS:  This is an 77 year old white male with a past medical history significant for coronary artery disease status post anterior ST-segment elevation myocardial infarction on December 25, 2010, ischemic cardiomyopathy with ejection fraction of 45%, diabetes, hypertension, status post pacemaker who presents for evaluation of abdominal pain and constipation.  Of note, the patient was hospitalized earlier this month with an anterior MI.  His post MI course was uncomplicated.  He has been doing well.  He reports that this evening he developed approximately 5 hours of abdominal pain that began in his suprapubic region and radiated up to his epigastrium.  He felt that he was constipated because he has not had a bowel movement in 5 days.  He took a "medicine" to help and have a bowel movement, however, he began having pain and came to the emergency room for evaluation.  He denies any nausea, vomiting, or diarrhea.  He denies any frank chest pain.  He denies any dyspnea on exertion, PND, orthopnea, lower extremity edema. He denies any tachy palpitations or syncope.  At the outside hospital, he was noted to have a normal EKG and normal cardiac enzymes, however, it was felt that he would benefit from transfer to our institution for further evaluation and monitoring.  PAST MEDICAL HISTORY: 1. Coronary artery disease status post ST-segment elevation and     myocardial infarction on December 25, 2010.  He is status post bare     metal stent placement to the LAD. 2. Ischemic cardiomyopathy,  ejection fraction after his MI was 45%. 3. Mild elevation in LFTs with an AST and ALT of 75 and 86. 4. Status post [employer] Jud[employer] pacemaker for second-degree AV block. 5. Hypertension. 6. Non-insulin dependent diabetes. 7. Hyperlipidemia. 8. History of gallstone pancreatitis status post cholecystectomy. 9. History of TIAs. 10.Gastroesophageal reflux disease.  SOCIAL HISTORY:  The patient is married.  He has not used tobacco, alcohol, or drugs.  FAMILY HISTORY:  I asked the patient if there was any significant family history, however, he did not report that there were any significant medical illnesses.  REVIEW OF SYSTEMS:  All systems were reviewed and are negative except as mentioned above in history of present illness.  MEDICATIONS:  The patient is unsure what medications he is supposed to be on, however, he reports that is what is on his previous discharge summary.  For details, please see discharge summary from December 30, 2010. It should be noted that Plavix was not on this discharge summary, however, I suspect that he has been taking Plavix because he had the stent.  The OB nurse is to contact the wife in the morning to get accurate medication list.  ALLERGIES: 1.  PENICILLIN. 2. Possible intolerance to some STATINS. 3. VIBRAMYCIN.  PHYSICAL EXAMINATION:  VITAL SIGNS:  Temperature afebrile, blood pressure is 120/80, pulse 70, respirations 18, and oxygen 96% on room air. GENERAL:  No acute distress. HEENT:  Normocephalic and atraumatic.  Pupils equal, round, and reactive to light and accommodation. EXTREMITIES:  Intact.  Oropharynx is pink and moist without lesions. NECK:  Supple.  No lymphadenopathy, no jugular venous distention, and no masses. CARDIOVASCULAR:  Regular rate and rhythm with a 2/6 soft, high-pitched holosystolic murmur heard at the apex. LUNGS:  Clear to auscultation bilaterally. ABDOMEN:  Positive bowel sounds, soft, nontender, and nondistended. EXTREMITIES:   Trace lower extremity edema bilaterally. SKIN:  No rashes. BACK:  No CVA tenderness. NEUROLOGIC:  No focal deficits. PSYCH:  Normal affect.  PERTINENT LABORATORY DATA:  White blood cell 8.8, hemoglobin 11.9, and platelets 200.  BUN 12 and creatinine 1.1.  Troponin less than 0.05. Urinalysis negative.  IMAGING:  Acute abdominal series shows no acute cardiopulmonary disease. There is no evidence of bowel obstruction.  However, he does have a significant amount of stool burden on his KUB.  EKG in the outside hospital demonstrates sinus rhythm with residual ST elevation, T-wave inversion in the anterolateral leads consistent with resolving changes of a subacute anterolateral myocardial infarction.  ASSESSMENT AND PLAN:  [name] is an 77 year old white male with a past medical history significant for recent anterior ST-elevation myocardial infarction status post bare metal stent to the left anterior descending, ischemic cardiomyopathy with an ejection fraction of 45% who presents with abdominal pain and was noted to have normal cardiac enzymes and findings consistent with constipation by history as well as KUB. 1. Admit the patient to Dr. Elvis Coil service under [facility]. 2. Abdominal pain.  Initially, there was concern by the emergency     department that this may represent a anginal equivalent, however,     after talking with the patient and reviewing his KUB, I think this     is most likely related to constipation.  However, we will cycle     cardiac enzymes.  We will start him on an aggressive bowel regimen     including senna and MiraLax. 3. Coronary disease with recent myocardial infarction and mild left     ventricular dysfunction.  We will repeat his echocardiogram today     as his mitral regurgitation murmur is more prominent.  In the     interim time, we will continue on aspirin, Plavix, and his home     beta blocker, ACE, and statin therapy. 4. Diabetes  mellitus.  Sliding scale insulin.  Hold oral medications. 5. Fluids, electrolytes, nutrition.  Saline lock IV fluids,     electrolytes are stable n.p.o. 6. Deep venous thrombosis prophylaxis with subcutaneous heparin.     Therisa Doyne, MD   ______________________________ Peter M. [place], M.D.    SJT/MEDQ  D:  01/19/2011  T:  01/19/2011  Job:  [id number]  Electronically Signed by PETER [place] M.D. on 01/21/2011 12:40:45 PM Electronically Signed by Aldona Bar MD on 01/22/2011 09:55:16 PM

## 2011-01-29 ENCOUNTER — Other Ambulatory Visit: Payer: Self-pay | Admitting: Cardiovascular Disease

## 2011-02-05 NOTE — Discharge Summary (Signed)
NAME:  [name], PURDY NO.:  [id number]  MEDICAL RECORD NO.:  [id number]           PATIENT TYPE:  I  LOCATION:  3704                         FACILITY:  [facility]  PHYSICIAN:  Chidinma Clites M. [place], M.D.  DATE OF BIRTH:  09/27/1926  DATE OF ADMISSION:  01/19/2011 DATE OF DISCHARGE:  01/21/2011                              DISCHARGE SUMMARY   PRIMARY CARDIOLOGIST:  Rhylan Gross M. [place], MD  GASTROENTEROLOGIST:  Sarra Rachels Hawks. Elnoria Howard, MD  PRIMARY CARE:  [facility] [place].  DISCHARGE DIAGNOSIS:  Abdominal pain and constipation.  SECONDARY DIAGNOSES: 1. History of coronary artery disease, status post anterior ST     elevation myocardial infarction with bare-metal stenting of the LAD     in April 2012. 2. Ischemic cardiomyopathy with ejection fraction of 45%. 3. History of gallstone pancreatitis with elevated LFTs this     admission. 4. History of second-degree AV block status post [employer] Jude permanent     pacemaker. 5. Hypertension. 6. Type 2 diabetes mellitus. 7. Hyperlipidemia. 8. Obesity. 9. Status post cholecystectomy. 10.History of transient ischemic attacks. 11.Gastroesophageal reflux disease.  ALLERGIES:  PENICILLIN, some STATIN AND VIBRAMYCIN.  PROCEDURES:  None.  HISTORY OF PRESENT ILLNESS:  An 77 year old male with history of CAD status post anterior MI and bare-metal stenting of the LAD on December 25, 2010 presented to the [facility] ED on January 19, 2011, with complaints of abdominal epigastric discomfort as well as bloating and constipation, stating as has not had a bowel movement 5 days.  ER staff was concerned given the patient's prior cardiac history that this might represent angina and the patient was admitted to the Cardiology Service.  HOSPITAL COURSE:  The patient was ruled out for MI.  Continued to complain of bloating and constipation and was placed on initially oral stool softener and laxative without much effect.  On January 20, 2011, he was  given a Engineer, building services with good results and multiple bowel movements. This has resulted in improved abdominal discomfort.  Of note, the patient did have elevation of his alkaline phosphatase to a peak of 196 with a peak AST of 136 and ALT of 91.  His AST and ALT have been trending down.  His amylase and lipase have been negative.  He also had an abdominal x-ray showing no acute findings.  We planned to discharge, Mr. Rathgeber today on stool softener and daily MiraLax.  We recommended GI follow up for additional epigastric and abdominal complaints.  DISCHARGE LABS:  Hemoglobin 11.9, hematocrit 34.9, WBC 8.8, platelets 200.  Sodium 137, potassium 4.0, chloride 103, CO2 of 27, BUN 11, creatinine 1.09, glucose 123, total bilirubin 0.7, alkaline phosphatase 196, AST 46, ALT 58, total protein 6.0, albumin 3.1, calcium 9.1, amylase 29, lipase 21, CK 36, MB 1.5, troponin-I 0.03, total cholesterol 162, triglycerides 84, HDL 54, LDL 91.  TSH 1.522.  Urinalysis was negative.  DISPOSITION:  The patient will be discharged to home today in good condition.  FOLLOWUP PLANS AND APPOINTMENTS:  The patient will follow up with Dr. [place] as previously scheduled.  Follow up with Dr. Elnoria Howard in the next  few weeks.  DISCHARGE MEDICATIONS: 1. MiraLax 17 g daily. 2. Senna/ docusate 8.6/50 mg 2 tablets nightly. 3. Aspirin 81 mg daily. 4. Aricept 5 mg nightly. 5. Acetaminophen 325 mg 1-2 tablets q.4  h. p.r.n. 6. Amlodipine 5 mg b.i.d. 7. Benazepril 20 mg daily. 8. Carvedilol 12.5 mg b.i.d. 9. Plavix 75 mg daily. 10.Toviaz 4 mg daily. 11.Hydrochlorothiazide 25 mg daily. 12.Magnesium sulfate 500 mg daily. 13.Metformin 500 mg b.i.d. 14.Multivitamin 1 tablet daily. 15.Nitroglycerin 0.4 mg sublingual p.r.n. chest pain. 16.Omeprazole 20 mg b.i.d. 17.Zocor 80 mg half a tablet daily.  OUTSTANDING LABS STUDIES:  None.  DURATION DISCHARGE ENCOUNTER:  Sixty minutes including physician time.     Nicolasa Ducking, ANP   ______________________________ Delores Thelen M. [place], M.D.   CB/MEDQ  D:  01/21/2011  T:  01/21/2011  Job:  [id number]  cc:   Alson Mcpheeters Hawks. Elnoria Howard, MD [facility]  Electronically Signed by Nicolasa Ducking ANP on 01/25/2011 04:28:50 PM Electronically Signed by Nikolis Berent [place] M.D. on 02/05/2011 09:34:52 AM

## 2011-02-06 NOTE — Consult Note (Signed)
Todd, Rich               ACCOUNT NO.:  [id number]   MEDICAL RECORD NO.:  [id number]          PATIENT TYPE:  INP   LOCATION:  3731                         FACILITY:  [facility]   PHYSICIAN:  Jordan Hawks. Elnoria Howard, MD    DATE OF BIRTH:  1927-03-17   DATE OF CONSULTATION:  02/23/2009  DATE OF DISCHARGE:                                 CONSULTATION   REASON FOR CONSULTATION:  Weight loss.   REFERRING PHYSICIAN:  From [facility].   HISTORY OF PRESENT ILLNESS:  This is a 77 year old gentleman with a past  medical history of pancreatitis with acute cholangitis in 2007, type 2  diabetes, hypertension, status post TIAs, history of bilateral carotid  stenosis, Meniere disease, status post laparoscopic cholecystectomy,  history of peptic ulcer disease, history of hyperlipidemia and a history  of COPD who was admitted to the hospital with syncopal episode.  The  patient has been undergoing workup from the Cardiology standpoint for  his syncopal episode where during the evaluation the patient reports  having issues with weight loss.  Apparently, PE was performed at [facility] and he was reported having 121 pound weight loss.  The patient  was referred over [facility] as he had undergone an EUS by Dr.  Elnoria Howard and was noted to have CBD stones.  An ERCP was attempted but at  this time the ERCP was not able to be performed and was a difficult  cannulation.  Subsequently, he was referred to [facility] for  ERCP.  Before performing ERCP, he underwent a repeat EUS and it was  determined that he did not have any retained stones in CBD and most  likely he had passed it during the interim.  Subsequently, the patient  reports having a decrease in his appetite, although his wife states that  if he is offered anything that is unhealthy for him he does not have any  issues with consuming these foods.  The patient denies any issues with  dysphagia, odynophagia, diarrhea, nausea, or  vomiting.  He has  occasional constipation.  After further questioning there is no change  in his diet in regards to eating healthy types of foods which has  resulted in a reduction of his p.o. intake.   PAST MEDICAL HISTORY AND PAST SURGICAL HISTORY:  As stated above.   FAMILY HISTORY:  Noncontributory.   SOCIAL HISTORY:  He is a prior smoker, rare alcohol use and lives with  his wife.   REVIEW OF SYSTEMS:  As above in history of present illness otherwise  negative.   MEDICATIONS:  1. Benazepril 20 mg p.o. daily.  2. Sliding scale insulin.  3. Protonix 20 mg p.o. daily.  4. Simvastatin 20 mg p.o. daily  5. Bisacodyl 10 mg p.r.n.  6. Zofran 4 mg IV q.8 h. p.r.n.   ALLERGIES:  PENICILLIN and ASPIRIN.   PHYSICAL EXAMINATION:  VITAL SIGNS:  Blood pressure is 139/79, heart  rate is 62, respirations 18, temperature is 98.2.  GENERAL:  The patient is in no acute distress, alert and oriented.  HEENT:  Normocephalic, atraumatic.  Extraocular muscles intact.  NECK:  Supple.  No lymphadenopathy.  LUNGS:  Clear to auscultation bilaterally.  CARDIOVASCULAR:  Regular rate and rhythm.  ABDOMEN:  Mildly obese, soft, nontender, nondistended.  Positive bowel  sounds.  EXTREMITIES:  No clubbing, cyanosis or edema.   LABORATORY VALUES:  White blood cell count is 5.9, hemoglobin is 11.5,  MCV is 93.3.  Sodium 142, potassium 2.8, chloride 108, CO2 28, glucose  88, BUN 8, creatinine 1.0, amylase is 61, lipase is 12.   IMPRESSION:  1. Weight loss.  2. Loss of appetite  3. Workup for syncope.  4. Other multiple medical problems.   At this time, I am unable to clearly identify the cause for his weight  loss.  It does not appear that he is eating healthier foods at this time  and as a result __________although this is per the wife's report.  He is  eating well here in the hospital, therefore, he has no issues with the  actual p.o. intake and it does not appear that there are any financial   distress in regards to affording any types of foods.  Weight loss is a  difficult issue to define overall.  There are patients that can have a  significant amount of weight loss and then plateau and not having any  further issues.  I do not believe an abdominal scan is necessary at this  time although this could be performed as an outpatient pending further  followup visits.  The patient is also noted to have some fluctuating  elevations in his alkaline phosphatase and AFB.  This could possibly be  as a result of any types of retained stones.   PLAN:  Again is to follow up in the office and further  management will  be made pending the continued longitudinal followup.      Jordan Hawks Elnoria Howard, MD  Electronically Signed     PDH/MEDQ  D:  02/23/2009  T:  02/24/2009  Job:  [id number]

## 2011-02-06 NOTE — H&P (Signed)
NAME:  [name], [name]               ACCOUNT NO.:  [id number]   MEDICAL RECORD NO.:  [id number]          PATIENT TYPE:  INP   LOCATION:  3731                         FACILITY:  [facility]   PHYSICIAN:  Acey Lav, MD  DATE OF BIRTH:  09-28-1926   DATE OF ADMISSION:  02/21/2009  DATE OF DISCHARGE:                              HISTORY & PHYSICAL   CHIEF COMPLAINT:  Syncopal episode.   HISTORY OF PRESENT ILLNESS:  Mr. Duplantis is a 77 year old Caucasian  gentleman with a past medical history significant for carotid artery  stenosis who had been admitted to [facility] on January 13 with  pancreatitis due to gall stones.  He had an extensive workup and was  seen by Dr. Elnoria Howard at the time, and was found to have a large common bile  duct with obstruction.  He was actually referred to [facility] for ERCP.  The patient states, ever since then, he has had problems with early  satiation and severe constipation.  He does also suffer from Meniere's  disease and has chronic vertiginous symptoms.  He has some unsteadiness  on his feet.  Apparently, this morning, the patient had gotten up to get  some water out of his refrigerator, when he apparently had a syncopal  episode.  He was found down by his wife and was found to be diffusely  diaphoretic.  He was able to stand.  Did feel dizzy, but he attributed  this to his chronic problems with my inner ear.  He then went back to  sleep, but with some convincing from his wife, was brought to the  emergency department at [facility] for evaluation of syncope.  In the emergency room at [facility] he had been given some  fluids.  He did not have orthostatic vital signs changes.  His EKG did  not have any significant EKG changes.  His cardiac markers were  negative, and his labs were relatively unremarkable.  He was referred to  the hospitalist service here at [facility] for further evaluation of his  syncope.   PAST MEDICAL HISTORY:  1.  COPD.  2. Hypertension.  3. Diabetes mellitus.  4. Peptic ulcer disease with intolerance of ASPIRIN.  5. Bilateral hypoacusia.  6. Retained gall stone with gall stone pancreatitis, common bile duct      dilatation as described above with evaluation done at [facility] with ERCP.  7. Bilateral carotid artery stenosis.  8. Problems with hyponatremia related to hydrochlorothiazide use.   PAST SURGICAL HISTORY:  ERCP with stent removal and sphincterotomy in  2007.   FAMILY HISTORY:  Noncontributory.   SOCIAL HISTORY:  Former smoker but quit remotely.  Rare alcohol use.  Lives with his wife.   REVIEW OF SYSTEMS:  As described in the history of present illness.  Also pertinent for 20-pound weight loss since January, early satiation  and constipation.   PHYSICAL EXAMINATION:  VITAL SIGNS:  Blood pressure here on the floor at  [facility] 142/69, pulse 73, respirations 18, pulse ox 97% on  room air,  temperature 97.9.  GENERAL:  The patient is extremely hard of hearing, but quite pleasant.  Alert and oriented x4.  HEENT:  Normocephalic, atraumatic.  He does have hearing aids in  bilaterally.  Pupils equal, round, and reactive to light.  Sclerae  anicteric.  Oropharynx has dentures.  Otherwise, no erythema or exudate.  NECK:  Supple.  CARDIOVASCULAR:  Regular rate and rhythm.  No murmurs, gallops, or rubs.  LUNGS:  Clear to auscultation bilaterally without wheeze, rales, or  rhonchi.  ABDOMEN:  Soft, nontender, nondistended.  Positive bowel sounds.  EXTREMITIES:  Trace edema.  NEUROLOGIC:  Nonfocal.   LABORATORY DATA:  EKG shows left anterior fascicular block.  Sinus  rhythm.  T wave flattening in aVL, which was new compared to prior EKG  in January 2010.  Otherwise, no acute EKG findings.  Cardiac enzymes  negative.  Lipase negative.  Comprehensive metabolic panel, sodium 143,  potassium 4.2, chloride 101, bicarb 29, BUN 13, creatinine 1.2, glucose  122.  AST and ALT 54  and 31, alkaline phosphatase 124, total protein  6.3, albumin 3.6.  Cardiac markers were negative.  Urinalysis negative.  Specific gravity 1.016, pH 6, negative glucose, small bilirubin, 15  ketones, otherwise negative.  CBC with differential shows white count is  9.1, hemoglobin 13.4, platelets 240,000, ANC 7.4.   ASSESSMENT AND PLAN:  This is a 77 year old Caucasian gentleman with  past medical history significant for carotid artery stenosis, chronic  vertigo due to Meniere's disease, who is admitted for workup for  syncope.   PROBLEM LIST:  1. Syncope.  We will admit the patient to telemetry, monitor him on      telemetry, cycle his cardiac enzymes.  I am reordering carotid      Dopplers, although carotid stenosis in and of itself is usually      insufficient to cause syncope.  Will also order vertebral artery      Dopplers.  We will recheck his orthostatic vital signs.  I am going      to give him a little bit of fluid and I will hold his thiazide      diuretic for the time being.  2. Weight loss with early satiation and problems with constipation.  I      will put him on some stool softeners.  Order ultrasound of his      right upper quadrant.  Will touch base with Dr. Elnoria Howard to see what      other evaluation he would like me to do at this point in time,      whether or not to repeat a CT scan.  Will get the records from      [facility] as well.  3. Diabetes mellitus.  Will put on sliding scale insulin, sensitive      scale.  Will hold metformin for the time being.  4. Hypertension.  Will continue him on his ACE inhibitor.  5. Prophylaxis.  The patient will be on heparin 5000 t.i.d.  6. History of peptic ulcer disease.  I will continue him on his PPI.  7. Code Status.  The patient is Full Code.      Acey Lav, MD  Electronically Signed     CV/MEDQ  D:  02/21/2009  T:  02/21/2009  Job:  [id number]   cc:   Merlene Laughter. Renae Gloss, M.D.  Evelena Peat, M.D.   Jordan Hawks Elnoria Howard, MD

## 2011-02-06 NOTE — Discharge Summary (Signed)
NAMEJORGE, RETZ               ACCOUNT NO.:  [id number]   MEDICAL RECORD NO.:  [id number]          PATIENT TYPE:  INP   LOCATION:  3008                         FACILITY:  [facility]   PHYSICIAN:  Lonia Blood, M.D.       DATE OF BIRTH:  12/02/1926   DATE OF ADMISSION:  01/09/2008  DATE OF DISCHARGE:  01/12/2008                               DISCHARGE SUMMARY   PRIMARY CARE PHYSICIAN:  Evelena Peat, M.D. with [facility].   DISCHARGE DIAGNOSES:  1. Chronic obstructive pulmonary disease exacerbation - resolved.  2. Brief episode of delirium secondary to high dose steroids.  3. Hypertension.  4. Diabetes mellitus type 2.  5. History of peptic ulcer disease.  6. Osteoarthritis.  7. Severe bilateral hypoacusia.  8. History of retained, bile duct stone with pancreatitis.  9. Bilateral carotid artery stenosis.  10.Hyponatremia secondary to hydrochlorothiazide - resolved.   DISCHARGE MEDICATIONS:  1. Avelox 400 mg daily for 3 more days.  2. Prednisone taper.  3. Combivent 2 puffs 4 times a day.  4. Zocor 80 mg at bedtime.  5. Prilosec 40 mg daily.  6. Metformin 500 mg daily.  7. Aspirin 81 mg daily.  8. Claritin 10 mg daily.   CONDITION ON DISCHARGE:  Mr. Pacholski is discharged in good condition.  At  that time of discharge, the patient is alert, oriented and appropriate.  He is discharged under the care of his wife.   CONSULTATION:  During this admission, no consultations obtained.   PROCEDURE DURING THIS ADMISSION:  Chest x-ray PA and lateral on January 09, 2008, showing COPD.   HISTORY AND PHYSICAL:  Refer the dictated H&P, which was done January 10, 2008 by Dr. Brien Few.   HOSPITAL COURSE:  1. COPD exacerbation.  Mr. Reeves presented to the emergency room with      a classical symptoms of COPD exacerbation.  He was admitted to a      regular floor and placed on high-doses intravenous steroids,      nebulizer treatments, as well as intravenous antibiotics.  He  did      respond nicely by having improvement in his dyspnea but he got      slightly confused after receiving intravenous steroids at the dose      of 80 mg every 8 hours.  After decreasing of the steroids, the      patient did remain with stable lung exam and decreased wheezes, and      his confusion disappeared.  The patient is discharged home with an      inhaled bronchodilator and a steroid taper and to finish a course      of 1 week of Avelox.  2. Diabetes mellitus type 2.  We have continued Mr. Jeff on his oral      medications without major adjustments.  He will follow up with his      primary care physician for a hemoglobin A1c checks and adjustment      of his antidiabetics.  3. Hyponatremia secondary to hydrochlorothiazide.  We have  discontinued the hydrochlorothiazide and placed the patient on      intravenous fluids.  His hyponatremia corrected from sodium 126 on      admission to a sodium of 135 at the time of discharge      Lonia Blood, M.D.  Electronically Signed     SL/MEDQ  D:  01/12/2008  T:  01/13/2008  Job:  [id number]   cc:   Evelena Peat, M.D.

## 2011-02-06 NOTE — Consult Note (Signed)
NAMEKEBIN, MAYE               ACCOUNT NO.:  [id number]   MEDICAL RECORD NO.:  [id number]          PATIENT TYPE:  INP   LOCATION:  5506                         FACILITY:  [facility]   PHYSICIAN:  Jordan Hawks. Elnoria Howard, MD    DATE OF BIRTH:  03-04-1927   DATE OF CONSULTATION:  DATE OF DISCHARGE:                                 CONSULTATION   REASON FOR CONSULTATION:  Pancreatitis.   HISTORY OF PRESENT ILLNESS:  Mr. Hagood is a 77 year old gentleman with  a history of retained stone pancreatitis who was admitted yesterday with  acute onset abdominal pain.  He was seen at the [facility]  ER.  He was found to have a lipase greater than 2000 and an abnormal  liver function test.  He was then transferred to [facility]  where he was kept n.p.o. and his symptoms were controlled with pain  medications.  This morning, he complains of no pain.  He is tolerating a  clear liquid diet.  He has a long history of biliary stents and  sphincterotomy the day back to 2007 by Dr. Virginia Rochester.  He has had a CT of his  abdomen and pelvis that did not show frank pancreatitis; however, that  showed very enlarged common bile duct of 14 mm.   PAST MEDICAL HISTORY:  1. Pancreatitis history, ERCP with 2 stents in 2007, history of Gram-      negative bacteremia with acute cholangitis in 2007.  2. Type 2 diabetes.  3. Hypertension.  4. History of TIA stroke.  5. History bilateral carotid stenosis.  6. History Meniere disease.  7. History of lap chole.  8. History of peptic ulcer disease.  9. History of hyperlipidemia.  10.History of COPD.   FAMILY HISTORY:  His mother is deceased of brain aneurysm.  His father  is also deceased of myocardial infarction.  His 1 sister with chronic  obstructive pulmonary disease who is deceased.   SOCIAL HISTORY:  The patient is married.  He is very hard of hearing.  He has some early memory loss.  There is no history of alcohol abuse.  He does have a history of  remote tobacco abuse.  The patient is retired  from Engineer, structural work.   HOME MEDICATIONS:  Actos, metformin, Nexium, aspirin, and benazepril.   ALLERGIES:  PENICILLIN.   REVIEW OF SYSTEMS:  The patient denies any melena, hematochezia, change  in appetite, nausea, vomiting, or diarrhea.  Otherwise, 10-point review  of systems is noncontributory.   PHYSICAL EXAMINATION:  VITAL SIGNS:  Blood pressure 127/71, T-max 99,  heart rate 78, SpO2 93% on room air, and respiratory rate 20.  GENERAL:  In no acute distress, sitting up in the chair.  The patient  speaks very loudly because he is severely hard of hearing.  HEENT:  His oropharynx is clear, has no exudates or erythema.  Dentition  is in poor repair.  LUNGS:  He has scattered rhonchi.  HEART:  Regular rate and rhythm.  No murmurs, rubs, or gallops.  EXTREMITIES:  He has no extremity edema.  ABDOMEN:  Soft and nontender.  There is no hepatosplenomegaly.  Bowel  sounds are active.  NEUROLOGIC:  Grossly intact.  The patient is very irritable, stating he  wants to go home.   LABORATORIES:  Lipase 2000 on October 05, 2008, lipase 50 on October 06, 2008.   Sodium 137, potassium 3.9, chloride 96, bicarb 31, BUN 17, creatinine  1.2, and glucose 141.  WBCs 10.2 and hemoglobin 13.6.  Bilirubin 1.3,  alk phos 156, AST 446, and ALT 104.   ASSESSMENT:  This is a 77 year old who has a history of an enlarged  common bile duct with obstruction.  He has had multiple procedures for  this same problem.  He has been relatively free of symptoms since 2007,  and now presents acutely with a picture consistent with acute common  bile duct obstruction possibly due to recurrent retained stone with  pancreatitis or possible stricture from prior procedures.  He will  likely need repeat ERCP with endoscopic ultrasound.  We will repeat his  LFTs today.  If they trend down, he can get this procedure in the  outpatient since he is clinically  much better.  He has been afebrile and  hemodynamically stable, and review of his CT does not show any acute  findings of the abdomen, other than his very enlarged common bile duct.   FINAL RECOMMENDATIONS:  Endoscopic ultrasound with ERCP.  Follow liver  function tests to ensure downward trend.      Edsel Petrin, D.O.  Electronically Signed      Jordan Hawks. Elnoria Howard, MD  Electronically Signed    ELG/MEDQ  D:  10/07/2008  T:  10/08/2008  Job:  [id number]

## 2011-02-06 NOTE — Discharge Summary (Signed)
[name], [name]               ACCOUNT NO.:  [id number]   MEDICAL RECORD NO.:  [id number]          PATIENT TYPE:  INP   LOCATION:  3731                         FACILITY[facility]  [facility]   PHYSICIAN:  Herbie Saxon, MDDATE OF BIRTH:  [birth year]   DATE OF ADMISSION:  02/21/2009  DATE OF DISCHARGE:  02/24/2009                               DISCHARGE SUMMARY   DISCHARGE DIAGNOSES:  1. Syncope likely vasovagal.  2. Diabetes, stable.  3. Hypertension, stable.  4. Chronic obstructive pulmonary disease, stable.  5. History of gastrointestinal bleed.  6. Anemia of chronic disease.  7. History of gallstone pancreatitis.  8. Chronic vertigo.  9. History of hypoacusis.  10.History of carotid artery disease.  11.Right internal carotid artery stenosis, 60-79% blockage.  12.History of Meniere disease.   CONSULTS:  1. Antonieta Iba, MD, Cardiology.  2. Jordan Hawks. Elnoria Howard, MD, Gastroenterology.   RADIOLOGY:  The MRI brain of February 22, 2009, showed no acute intracranial  abnormality.  Nuclear stress test of February 22, 2009, possibly showed no  evidence of exercise-induced myocardial ischemia.  Left ventricular  systolic function is normal and ejection fraction equal to 73%.  Abdominal ultrasound of February 22, 2009, showed the patient is status post  cholecystectomy with stable common bile duct dilatation.  CT angiogram  of the chest of February 22, 2009, revealed no evidence of acute PE, no acute  findings of the chest.  There is a benign right upper lobe pulmonary  nodule.  CT of head on admission, no acute changes.  Chest x-ray on  admission, also no acute cardiopulmonary disease.   HOSPITAL COURSE:  This is a 77 year old Caucasian gentleman who  presented to the emergency room after having passed out.  He was trying  to get water out of his refrigerator when he apparently had this  syncopal episode.  The patient does have a history of Meniere disease.  He also gives a history of gradual weight  loss with early satiation and  intermittent constipation.  The constipation has been improved with  stool softeners.  The patient does not have anorexia with healthy food,  but eats a lot of junk food.  The possible transient hypotension or  postmicturition syncope was been entertained by Cardiology and the 2-D  echocardiogram was performed on February 22, 2009, showed mild concentric  hypertrophy with ejection fraction normal range of 55-60%.  Wall motion  was normal.  He has borderline mild aortic stenosis.  The patient was  reviewed by Gastroenterology at present.  There is no acute process  being identified for account for his weight loss or syncopal episode.  The patient is clinically stable and has been cleared for discharge home  by Gastroenterology and Cardiology.   DISCHARGE CONDITION:  Stable.   DIET:  A 2 g sodium, 1800-calorie ADA, low cholesterol.   ACTIVITY:  Increase slowly as tolerated.  He is being counseled to get  up slowly from a sitting position to avoid abrupt hypotension.  He will  follow up with his primary care physician, Dr. Andi Devon in the  next 5-7 days.  Follow up with Dr. Elnoria Howard, Gastroenterology in the next 2  weeks.  Follow up with Dr. Mariah Milling, Cardiology as needed in the next 6-8  weeks.   MEDICATIONS ON DISCHARGE:  1. Hydrochlorothiazide 12.5 mg daily.  2. Meclizine 25 mg q.8 h. p.r.n.  3. Benazepril 20 mg daily, however, the blood pressure is 100/60.  4. Zocor 20 mg daily.  5. Omeprazole 20 mg daily.  6. Metformin 500 mg b.i.d.  7. Colace 100 mg b.i.d.  8. MiraLax 17 g daily as needed for constipation.  9. Multivitamin.   Note primary care physician to coordinate follow up with ENT as  outpatient to check his middle ear  in the next 1-2 weeks.   PHYSICAL EXAMINATION:  GENERAL:  He is an elderly man not in acute  distress.  VITAL SIGNS:  Stable.  Temperature 98, pulse 64, respiratory rate 18,  blood pressure 142/86, and saturating 95% on room  air.  HEENT:  Pupils equal, reactive to light and accommodation.  He is mildly  pale, not jaundiced.  NECK:  Supple.  Mucous membranes are moist.  Oropharynx and nasopharynx  are clear.  There is no thyromegaly, has a right-sided carotid bruit.  CHEST:  Clinically clear.  HEART:  Heart sounds 1 and 2, regular.  A 2/6 systolic murmur at the  left sternal border.  ABDOMEN:  Benign.  NEUROLOGIC:  He is alert and oriented to time, place, and person.  EXTREMITIES:  Peripheral pulses present.  No pedal edema.   LABORATORY DATA:  WBC is 5.9, hematocrit 34, platelet count is 212.  Chemistry; sodium is 142, potassium 3.8, chloride 108, bicarbonate 28,  glucose 88, BUN 8, creatinine 1.0, lipase is normal at 201, amylase is  normal at 61.  Troponin 0.01.   DISCHARGE TIME:  20 minutes.      Herbie Saxon, MD  Electronically Signed    MIO/MEDQ  D:  02/24/2009  T:  02/25/2009  Job:  [id number]   cc:   Merlene Laughter. Renae Gloss, M.D.  Jordan Hawks Elnoria Howard, MD  Antonieta Iba, MD

## 2011-02-06 NOTE — H&P (Signed)
NAME:  [name], [name]               ACCOUNT NO.:  [id number]   MEDICAL RECORD NO.:  [id number]          PATIENT TYPE:  INP   LOCATION:  5506                         FACILITY:  [facility]   PHYSICIAN:  Vania Rea, M.D. DATE OF BIRTH:  1927/05/22   DATE OF ADMISSION:  10/06/2008  DATE OF DISCHARGE:                              HISTORY & PHYSICAL   PRIMARY CARE PHYSICIAN:  Dr. Caryl Never at [facility].   GASTROENTEROLOGIST:  Jordan Hawks. Elnoria Howard, M.D.   CHIEF COMPLAINT:  Abdominal pain since 3 p.m. yesterday afternoon.   HISTORY OF PRESENT ILLNESS:  This is an obese, elderly Caucasian  gentleman with a history of diabetes, status post cholecystectomy, past  history of pancreatitis related to a retained stone and a history of  COPD who apparently at his last meal at about 10:30 a.m. yesterday and  was quite well until about 3 p.m. when he developed colicky abdominal  pain.  The patient was taken to the free standing emergency room at [facility] where he was evaluated and found to have a markedly elevated  amylase, was diagnosed with acute pancreatitis, received IV fluids, a GI  cocktail and Zofran, and this seemed to bring significant relief of the  pain.  The patient was transferred to [facility] for further  management and evaluation.   Of note, the patient is extremely hard of hearing, and the history is  somewhat difficulty.  Therefore, history is taken by discussing with his  wife over the phone.  His wife reports that apart from this, the patient  has had no problems.  He has had no nausea, vomiting or diarrhea.   Currently, the patient denies any problem, and principally wants to be  left alone.  He seems to be extremely hard of hearing, but is able to  indicate that he is not quite sure where he is, although he does not  have a history of dementia, and he is alert and appropriate.  He denies  any pain at this time.   PAST MEDICAL HISTORY:  1. COPD.  2. Hypertension.  3. Diabetes type 2.  4. Peptic ulcer disease.  5. Severe bilateral hypoacusia.  6. History of retained gallstone with pancreatitis.  7. Bilateral carotid artery stenosis.  8. Hyponatremia related to HCTZ use.   MEDICATIONS:  Unsure of the patient's medications.  Computerized records  indicate:  1. Actos.  2. Metformin.  3. Nexium.  4. Aspirin.  5. Benazepril.   ALLERGIES:  PENICILLIN.   SOCIAL HISTORY:  No history of tobacco, alcohol or illicit drug use.   FAMILY HISTORY:  Wife does not know.  Unable to get this from the  patient.   REVIEW OF SYSTEMS:  Other than noted above, a 10-point review of systems  is remarkably negative.   PHYSICAL EXAMINATION:  GENERAL:  A small-framed, elderly Caucasian  gentleman lying in bed in no distress at all.  VITAL SIGNS:  Temperature is 99.3, pulse 110, respirations 23, blood  pressure 123/74.  He is saturating at 94% on room air.  His weight is  listed at  86.6 kg.  HEENT:  Pupils are round and equal.  Mucous membranes are pink.  Anicteric.  NECK:  He has no cervical lymphadenopathy or thyromegaly.  He does have  a thick neck.  CHEST:  His chest is clear to auscultation bilaterally.  CARDIOVASCULAR:  Regular rhythm without murmur.  ABDOMEN:  Slightly obese but soft and nontender to deep palpation.  There is no ascites.  EXTREMITIES:  Without edema.  He has 2+ pulses bilaterally.  CENTRAL NERVOUS SYSTEM:  Cranial nerves II-XII are grossly intact with  the exception of the severe hearing impairment, and there are no  lateralizing signs.   LABORATORY DATA:  CBC is remarkable only for a white count of 10.2 with  86% neutrophils and absolute neutrophil count of 8.7.  It is otherwise  unremarkable.  His hemoglobin is 13.6, MCV 94.8, platelets of 215.  His  comprehensive metabolic panel is remarkable only for a glucose of 141,  BUN 17, creatinine 1.2.  His total bilirubin is 1.3, alkaline  phosphatase elevated to 156, AST  elevated to 446 and alkaline  phosphatase 101.  His albumin is 4.1.  Potassium is 3.9.  Serum lipase  is related as greater than 2000.  Urinalysis specific gravity is 1.017,  and it is otherwise unremarkable.  Cardiac enzymes are completely normal  with undetectable troponins.  Fecal occult blood is negative.  A CT scan  of the abdomen and pelvis reveals no acute abnormality in the abdomen  and no acute abnormality in the pelvis.  He has chronic dilation of the  common bile duct with the greatest measurement being 14 mm.  No remark  is made of his pancreas.   ASSESSMENT:  1. Acute pancreatitis versus peptic ulcer disease.  2. Dehydration.  3. Diabetes, type 2  4. Hypertension   PLAN:  1. We will bring this gentleman in for dehydration.  We will keep him      NPO, and will consult his gastroenterologist.  2. Will manage his diabetes with sliding scale insulin while he is      NPO, and will be cautious with his fluids because of his age.  3. Other plans as per orders.      Vania Rea, M.D.  Electronically Signed     LC/MEDQ  D:  10/06/2008  T:  10/06/2008  Job:  [id number]   cc:   Evelena Peat, M.D.  Jordan Hawks Elnoria Howard, MD

## 2011-02-06 NOTE — H&P (Signed)
NAME:  [name], [name] NO.:  [id number]   MEDICAL RECORD NO.:  [id number]          PATIENT TYPE:  EMS   LOCATION:  MAJO                         FACILITY:  [facility]   PHYSICIAN:  Isidor Holts, M.D.  DATE OF BIRTH:  17-Jan-1927   DATE OF ADMISSION:  01/09/2008  DATE OF DISCHARGE:                              HISTORY & PHYSICAL   PRIMARY MEDICAL DOCTOR:  Dr. Caryl Never, [facility].   CHIEF COMPLAINT:  Progressive weakness for approximately 1 month, also  increased shortness of breath for the past few days, recent chest  infection, cough productive of yellowish phlegm.   HISTORY OF PRESENT ILLNESS:  This is a 77 year old male.  For past  medical history, see below.  According to the patient and his spouse,  who came with him to the emergency department, the patient has been  progressively weak for the past 1 month.  Denies falls, however.  Appetite is also somewhat diminished.  Approximately 5 days ago, he  developed a chest infection associated with cough productive of  yellowish phlegm and increased shortness of breath.  His saw his primary  MD, who commenced him on Azithromycin, however, he was unable to take  this for more than a couple of days, because of nausea and vomiting.  The last episode of vomiting was approximately 2 days ago.  Denies  abdominal pain.  Denies fever or diarrhea.   PAST MEDICAL HISTORY:  1. Hypertension.  2. Type 2 diabetes mellitus.  3. Peptic ulcer disease.  4. Degenerative joint disease.  5. History of TIA.  6. History of Meniere's disease.  7. Lumbar spondylosis.  8. Stable right upper lobe pulmonary nodule.  9. Syncopal episode in 2006.  10.Bilateral carotid artery stenosis 60% to 80%.  11.Bilateral lacunar infarcts per head CT scan of February 23, 2006.  12.Recurrent acute pancreatitis/cholangitis, status post biliary      stent.  Hospitalized May 24, 2006 to September 3, 207 for same.  13.Status post ERCP with  stent removal and sphincterotomy June 25, 2006 by Dr. Sabino Gasser.  14.Dyslipidemia.   MEDICATIONS:  1. Actos 7.5 mg p.o. daily.  2. Metformin 500 mg p.o. daily.  3. Aspirin (325 mg) half a tablet p.o. daily.  4. Prednisone taper.  5. Hydrochlorothiazide 25 mg p.o. daily.  6. Zocor 80 mg p.o. q.h.s.  7. Prilosec 40 mg p.o. daily.   ALLERGIES:  PENICILLIN.  THIS CAUSES A RASH.   REVIEW OF SYSTEMS:  As per HPI and chief complaint, otherwise negative.   SOCIAL HISTORY:  The patient is married.  Ex-smoker, quit over 25 years  ago.  Nondrinker.   FAMILY HISTORY:  [name] mother is deceased.  She had an  intracranial aneurysm.  [name] father is also deceased status  post myocardial infarction.  He has 1 sister with Meniere's disease and  another who is deceased with COPD.  Family history is otherwise  noncontributory.   PHYSICAL EXAMINATION:  VITALS:  Temperature 97.6, pulse 76 minute,  respiratory rate 8, BP 127/77 mmHg, pulse oximeter 97% on 4  liters of  oxygen.  The patient does not appear to be in obvious acute distress at  the time of this evaluation following bronchodilator treatment in the  emergency department.  Alert, communicative, not short of breath at  rest, talking in complete sentences.  HEENT:  No clinical pallor, no jaundice.  No conjunctival injection.  Throat:  Visible mucous membranes appear dry.  NECK:  Supple.  JVP not seen.  No palpable lymphadenopathy.  No palpable  goiter.  CHEST:  Occasional bilateral expiratory rhonchi.  No crackles.  HEART:  Heart sounds, S1 and S2 heard, normal, regular, no murmurs.  ABDOMEN:  Moderately obese, soft and nontender.  No palpable  organomegaly or palpable masses.  Normal bowel sounds.  LOWER EXTREMITY EXAMINATION:  No pitting edema.  Palpable peripheral  pulses.  MUSCULOSKELETAL SYSTEM:  Osteoarthritic changes are noted.  CENTRAL NERVOUS SYSTEM:  No focal neurologic deficit on gross  examination.    INVESTIGATIONS:  CBC:  WBC 13.7, hemoglobin 14.7, hematocrit 43.5,  platelets 309.  Electrolytes:  Sodium 126, potassium 4.2, chloride 90,  CO2 26, BUN 34, creatinine 1.46, glucose 151.  AST 22, ALT 16, alkaline  phosphatase 6.5, lipase 19.  Urinalysis was negative.  Chest x-ray dated  January 09, 2008 shows no acute findings, lingular scarring and probable  COPD.   ASSESSMENT AND PLAN:  1. Acute bronchitis.  As evidenced by increased wheeze, shortness of      breath and cough productive of yellowish phlegm.  The patient      failed outpatient antibiotic treatment secondary to      gastrointestinal side effects.  We shall therefore manage with      intravenous Avelox, also Mucinex.   1. Infective exacerbation of chronic obstructive pulmonary disease.      Secondary to acute bronchitis above.  We shall manage with      bronchodilator nebulizers, steroids, oxygen supplementation,      otherwise as outlined above.   1. Type 2 diabetes mellitus.  We shall continue pre-admission oral      hypoglycemic medications, place the patient on sliding scale      insulin coverage and carbohydrate-modified diet.   1. Dehydration and hyponatremia.  This is likely secondary to      Hydrochlorothiazide.  We shall discontinue this, administer      intravenous normal saline and follow electrolytes.   1. Hypertension.  This appears controlled.  However, should it become      elevated during this hospitalization, we will likely commence the      patient on an ACE inhibitor.   Further management will depend on clinical course.      Isidor Holts, M.D.  Electronically Signed     CO/MEDQ  D:  01/10/2008  T:  01/10/2008  Job:  [id number]   cc:   Evelena Peat, M.D.

## 2011-02-06 NOTE — Discharge Summary (Signed)
[name], [name]               ACCOUNT NO.:  [id number]   MEDICAL RECORD NO.:  [id number]          PATIENT TYPE:  INP   LOCATION:  5506                         FACILITY:  [facility]   PHYSICIAN:  Herbie Saxon, MDDATE OF BIRTH:  02/09/1927   DATE OF ADMISSION:  10/06/2008  DATE OF DISCHARGE:  10/08/2008                               DISCHARGE SUMMARY   DISCHARGE DIAGNOSES:  1. Gallstone pancreatitis.  2. Moderate gastritis.  3. Choledocholithiasis.  4. Diabetes.  5. Hypertension.  6. History of transient ischemic attack cerebrovascular accident.  7. Bilateral carotid stenosis.  8. History of deafness with Meniere disease.  9. Hyperlipidemia.  10.Chronic obstructive pulmonary disease.   PROCEDURE:  Upper endoscopy was performed on October 08, 2008, by Dr.  Elnoria Howard, result showed moderate gastritis and choledocholithiasis.  The  patient is being scheduled for ERCP next week as an outpatient.   RADIOLOGY:  The chest x-ray of October 06, 2008, shows right basilar  subsegmental atelectasis, otherwise negative.  CT abdomen and pelvis of  October 05, 2008, shows permanent common bile duct dilatation, moderate  left inguinal hernia containing fat.   HOSPITAL COURSE:  This is a 77 year old male presented with upper  quadrant abdominal pain of 2 days duration.  On presentation, he was  found to have an elevated amylase and serum lipase was greater than  2000, was admitted as a case of acute pancreatitis.  Gastroenterology  was consulted and Dr. Elnoria Howard did proceed to do the endoscopy today with  results as previously dictated.  He was initially been scheduled for  ERCP but this is being postponed till next week.  [name] blood  pressure is just above normal, but we are adding ACE inhibitor to his  blood pressure regimen, diabetes control is stable.  He is on statins  for hyperlipidemia.   DISCHARGE CONDITION:  Stable.   DIET:  Low-fat heart-healthy, 1800-calorie ADA.   ACTIVITY:  Increase slowly as tolerated.   FOLLOWUP:  Followup with Dr. Andi Devon or Dr. Caryl Never,  [facility], in the next 5-7 days, follow up with Dr.  Elnoria Howard, Gastroenterology, in the next 1 week for the ERCP.   DISCHARGE MEDICATIONS:  1. Benazepril 20 mg daily.  2. HCTZ 25 mg daily.  3. Percocet 5/325 one tab q.6 h. for moderate pain.  4. Aspirin 325 mg daily.  5. Omeprazole 20 mg b.i.d.  6. Metformin 500 mg b.i.d.  7. Actos 7.5 mg daily.  8. Simvastatin 80 mg daily.   PHYSICAL EXAMINATION:  GENERAL:  He is an elderly man, not in acute  distress.  VITAL SIGNS:  Temperature 98, pulse 82, respiratory rate 18, blood  pressure 157/94.  HEENT:  Pupils equal, reactive to light and accommodation.  Mild  jaundice, clinically pale.  Oropharynx and nasopharynx are clear.  Head  is atraumatic and normocephalic.  He has truncal obesity.  NECK:  Supple.  CHEST:  Clinically clear.  CARDIAC:  Heart sounds 1 and 2,  regular rate and rhythm.  ABDOMEN:  Benign.  Left inguinal hernia present.  NEUROLOGIC:  Alert.  He has  deafness situated in left ear.  EXTREMITIES:  Peripheral pulses present.  No pedal edema.   LABORATORY DATA:  ALT is 110, AST 64, sodium 142, potassium 4.8,  chloride 106, bicarbonate 23, glucose 114, BUN is 12, creatinine is 1.2.  Complete blood count, WBC is 7.4, hematocrit 37, platelet count is 172.   DISCHARGE TIME:  Greater than 30 minutes.      Herbie Saxon, MD  Electronically Signed     MIO/MEDQ  D:  10/08/2008  T:  10/09/2008  Job:  [id number]   cc:   Jordan Hawks. Elnoria Howard, MD  Merlene Laughter. Renae Gloss, M.D.  Evelena Peat, M.D.

## 2011-02-09 NOTE — H&P (Signed)
NAME:  [name], [name]               ACCOUNT NO.:  [id number]   MEDICAL RECORD NO.:  [id number]          PATIENT TYPE:  OBV   LOCATION:  1831                         FACILITY:  [facility]   PHYSICIAN:  Della Goo, M.D. DATE OF BIRTH:  07-23-1927   DATE OF ADMISSION:  02/09/2006  DATE OF DISCHARGE:                                HISTORY & PHYSICAL   This is a 24-hour observation to the [employer] [facility].   PRIMARY CARE PHYSICIAN:  Teena Irani. Arlyce Dice, M.D.   CHIEF COMPLAINT:  Severe dizziness.   HISTORY OF PRESENT ILLNESS:  A 77 year old male with chronic  dizziness/vertigo that occurs intermittently.  He complains of having severe  vertigo episode today and was unable to walk, almost falling down.  The  patient reports a sudden onset of the severe dizziness along with severe  nausea, no vomiting.  He reports this occurred while riding on his tractor  while doing lawn work today.  The patient also reports his symptoms are  worse when he goes from sitting to a standing position.  The patient reports  having an evaluation and treatment per his primary care physician and his  ear, nose, and throat physician.  He reports seeing the ENT earlier this  week and had treatment to stabilize the otoliths in his vestibule in which  he reports having relief for approximately three days, up until now.  He  reports having outpatient studies, CAT scan in the past.  The patient also  has a hearing impairment bilaterally.  He also denies having any symptoms of  chest pain or shortness of breath.   REVIEW OF SYSTEMS:  He denies having any fevers, chills, chest pain,  shortness of breath.  He has positive intermittent nausea, no vomiting, no  diarrhea, no myalgias, no arthralgias.  Positive symptoms of near syncope.  Positive vertigo.  And, positive decreased hearing which is chronic.  Of  note, the patient denies having a diagnosis of Meniere disease, however,  reports that his sister does  have this diagnosis.   PAST MEDICAL HISTORY:  1.  Hypertension.  2.  Hyperlipidemia  3.  Type 2 diabetes.  4.  Peptic ulcer disease in 2001.  5.  Arthritis.  6.  History of TIA.   PAST SURGICAL HISTORY:  Status post cholecystectomy.   MEDICATIONS:  [name] wife will call to give the patient's list of  current medications.  The only medication the patient brought to the  emergency department was meclizine 12.5 mg p.o. t.i.d. which he reports is  not giving him any relief.   ALLERGIES:  1.  PENICILLIN causing a rash.  2.  The patient has an intolerance to ASPIRIN THERAPY secondary to his      peptic ulcer disease.   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  No history of tobacco or alcohol  usage.   FAMILY HISTORY:  Mother died of an aneurysm, Father died of an MI age 23,  and his sister has Meniere disease.  The patient also has a sister who died  recently of COPD emphysema.   PHYSICAL EXAMINATION FINDINGS:  GENERAL:  This is a pleasant 77 year old  obese male in no acute distress.  VITAL SIGNS:  Temperature 97.1, blood pressure ranging from 141/77 to  140/70, heart rate 61 to 70, respirations 20, O2 saturation 96 to 97% on  room air.  HEENT:  Normocephalic atraumatic.  No scleral icterus.  Pupils are equal,  round, and reactive to light.  Extraocular muscles are intact.  Funduscopic  examination benign.  Tympanic membranes are dull bilaterally.  Oropharynx:  The patient is edentulous.  There is an upper denture.  There is no  oropharyngeal exudation.  Mucosa is moist.  NECK:  Supple.  Full range of motion.  No jugular venous distention.  No  bruits.  No thyromegaly.  CARDIOVASCULAR:  Regular rate and rhythm.  No murmurs, gallops, or rubs.  LUNGS:  Clear to auscultation bilaterally.  No rales, rhonchi, or wheezes.  ABDOMEN:  Positive bowel sounds.  Obese, soft, nontender, nondistended.  No   hepatosplenomegaly.  RECTAL:  Deferred.  GENITOURINARY:  Deferred.  BACK:  No CVA tenderness.  No spinous process tenderness.  Full range of  motion.  NEUROLOGIC:  The patient is alert and oriented x3.  There are nonfocal  findings except for severe impaired hearing bilaterally and decreased  balance.   LABORATORY STUDIES:  Hemoglobin 15.0, hematocrit 44.0.  Chemistry reveals a  sodium of 138, potassium 4.4, chloride 109, bicarb 26, BUN 12, creatinine  1.2, glucose 130.  ABG reveals a pH of 7.420, pCO2 37.7, bicarb level of  24.5.  Urinalysis is negative.   ASSESSMENT:  A 77 year old male with chronic vertigo with severe episode  today.   DIAGNOSES:  1.  Vertigo.  Probably secondary to vestibular disease which is chronic,      also to rule out possible cardiac or ischemic causes.  2.  Hypertension.  3.  Type 2 diabetes.  4.  Hyperlipidemia.   PLAN:  1.  The patient has been admitted for 24-hour observation.  2.  Placed on telemetry for cardiac monitoring.  If the patient's course is      uneventful, the patient will be discharged to home to follow up with his      primary care physician for further therapies.  3.  Cardiac enzymes have been ordered along with an EKG.  4.  The patient will be medicated p.r.n. for nausea with Zofran p.o. or IV.  5.  The meclizine therapy has been increased to 25 mg p.o. t.i.d.  6.  The patient will be placed on fall precautions and will be out of bed      with assistance only.  The patient will continue his regular medications      which will be added once they are received.  The patient is unable to      give the names of his medications.  7.  Sliding scale coverage with insulin has been ordered q.a.c. and h.s.      p.r.n.  8.  The patient has been placed on GI and DVT prophylaxis.  9.  He has also been placed on a diabetic/carbohydrate-modified diet. 10. Further considerations are whether to order an MRI to look for other      cranial  etiologies of vertigo.  Basically,      this patient appears to have chronic Meniere's disease.  11. Laboratory studies in the a.m. have also been ordered  to evaluate his      diabetic control, a hemoglobin A1c along with a TSH level.  Cardiac      enzymes will also be done every eight hours for 24 hours.      Della Goo, M.D.  Electronically Signed     HJ/MEDQ  D:  02/09/2006  T:  02/09/2006  Job:  [id number]

## 2011-02-09 NOTE — H&P (Signed)
NAME:  [name], [name]               ACCOUNT NO.:  [id number]   MEDICAL RECORD NO.:  [id number]          PATIENT TYPE:  INP   LOCATION:  6710                         FACILITY:  [facility]   PHYSICIAN:  Hillery Aldo, M.D.   DATE OF BIRTH:  Mar 04, 1927   DATE OF ADMISSION:  03/09/2006  DATE OF DISCHARGE:                                HISTORY & PHYSICAL   PRIMARY CARE PHYSICIAN:  Teena Irani. Arlyce Dice, M.D.   CHIEF COMPLAINT:  Abdominal pain, fall with head contusion, dyspnea and  syncope.   HISTORY OF PRESENT ILLNESS:  The patient is a 77 year old male with past  medical history of diabetes and hypertension.  He developed abdominal pain  shortly after eating supper tonight.  This was immediately followed by some  shortness of breath but no cough or fever.  The patient states he ate out at  a restaurant tonight.  His wife ate a different entree than he did.  He  states he developed abdominal pain after eating.  Upon return home, he was  so uncomfortable that he became a little bit restless and fell out of his  chair secondary to abdominal pain, striking his head.  There was no loss of  consciousness.  The patient claims that he has had multiple episodes of  dizziness with orthostatic change.  Upon arrival in the emergency  department, he was found to have a blood pressure of 87/67 and was admitted  for further evaluation and workup.   PAST MEDICAL HISTORY:  1.  Diabetes.  2.  Hypertension.  3.  Hyperlipidemia  4.  History of cholecystectomy.  5.  Peptic ulcer disease in 2001.  6.  History of osteoarthritis.  7.  History of transient ischemic attack.  8.  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  9.  Lumbar spondylosis.   FAMILY HISTORY:  [name] mother died of aneurysm.  Father died of MI  at age 39.  He has one sister with Meniere's disease and another sister who  is deceased from COPD.   SOCIAL HISTORY:  The patient is married.  He is a retired Chief Financial Officer.  Denies any tobacco or alcohol use.   ALLERGIES:  PENICILLIN causes rash.  He is intolerant to ASPIRIN secondary  to history of peptic ulcer disease.   CURRENT MEDICATIONS:  1.  Ranitidine 150 mg b.i.d.  2.  Benazepril/hydrochlorothiazide, dosage unknown, daily.  3.  Vitorin 10/80 one tablet daily.  4.  Avandia 2 mg daily.   REVIEW OF SYSTEMS:  The patient reports a good appetite.  No fever or  chills.  No change in bowel habits.  Unaware if there is any blood in the  stool.  States that approximately 1-1/2 weeks ago, he had inner ear pain  which he has not had in quite some time.  Again, he reports frequent  episodes of presyncope and dizziness with positional changes.   PHYSICAL EXAMINATION:  VITAL SIGNS:  Temperature 98.4, pulse 113,  respirations 18, blood pressure on arrival 87/67. Most recent blood pressure  169/71.  GENERAL:  Well-developed, well-nourished male  in no acute distress.  HEENT:  The patient has approximately 3 cm right frontal contusion.  PERRL.  EOMI.  Oropharynx is clear.  NECK:  Supple, no thyromegaly, no lymphadenopathy, no jugular venous  distension.  CHEST:  Lungs clear to auscultation bilaterally with good air movement.  HEART:  Tachycardiac rate, regular rhythm.  No murmurs, rubs, gallops.  ABDOMEN:  Distended.  Soft.  Currently nontender.  Bowel sounds positive.  EXTREMITIES:  No clubbing, edema, cyanosis.  SKIN:  Warm and dry.  No rashes.  NEUROLOGIC:  Patient is alert and oriented x3.  Cranial nerves II-XII are  grossly intact except for a significant hearing impairment.  Moves all  extremities x4 with equal strength.   DATA REVIEW.:  Sodium is 139, potassium 3.8, chloride 105, bicarb 27, BUN  13, creatinine 1.5, glucose 248. White blood cell count is 9.8, hemoglobin  13.7, hematocrit 41.2, platelets 250 with an absolute neutrophil count 6.1.  PT is 12.9, PTT 26.   ASSESSMENT/PLAN:  1.  Abdominal pain with nausea and vomiting:  Unclear  etiology.  The patient      did have a CT scan of the abdomen and pelvis which was unrevealing.  He      felt immediately better after having a large amount of emesis in the      emergency department.  Nevertheless, given his hypotensive episode, we      will admit him, check liver function studies and lipase, and follow his      course clinically.  Will administer antiemetics p.r.n. and IV fluids.      Will check a KUB in the morning.  2.  Diabetes:  Will monitor the patient's glycemic control and check      hemoglobin A1c.  Will administer sliding scale insulin a.c. and h.s.      and hold his Avandia while he is acutely nauseated with vomiting.  3.  Hypertension:  Will hold the patient's antihypertensives at present      given his low presenting blood pressure.  Will check orthostatics.  4.  Syncopal event:  This is likely secondary to orthostatic hypotension.      Nevertheless, the patient does have some risk factors for coronary      disease, so we will monitor him on the telemetry unit, check a 12-lead      EKG, check a chest x-ray, and cycle enzymes q.8 h x3.  We will also      check a thyroid stimulating hormone level.  5.  Prophylaxis:  Will initiate gastrointestinal prophylaxis with a proton      pump inhibitor and deep vein thrombosis prophylaxis with PAS hoses.      Hillery Aldo, M.D.     CR/MEDQ  D:  03/10/2006  T:  03/10/2006  Job:  [id number]   cc:   Teena Irani. Arlyce Dice, M.D.  Fax: [phone]

## 2011-02-09 NOTE — Op Note (Signed)
NAME:  [name], [name]               ACCOUNT NO.:  [id number]   MEDICAL RECORD NO.:  [id number]          PATIENT TYPE:  AMB   LOCATION:  ENDO                         FACILITY:  [facility]   PHYSICIAN:  Georgiana Spinner, M.D.    DATE OF BIRTH:  10-16-1926   DATE OF PROCEDURE:  DATE OF DISCHARGE:                                 OPERATIVE REPORT   PROCEDURE:  ERCP with stent removal and sphincterotomy.   INDICATIONS:  Previous episodes of cholangitis secondary to biliary disease.  Stent placed previously at this point.   ANESTHESIA:  Fentanyl 120 mcg, Versed 10 mg.   PROCEDURE:  With the patient mildly sedated in the prone position, the  Olympus side-viewing endoscope was inserted in the mouth and passed through  the esophagus and stomach into the duodenum where the previously placed  stent was noted and had just about come out completely, so it was grasped  with a rat tooth biopsy forceps and pulled out and allowed to remain in the  duodenum.  Subsequently, therefore, we were able to get relatively good  visualization of the ampulla, and an ulcer was seen at the site of the os.  Using a the Tri-Tome catheter on the second pass, we were able to get free  cannulation over a guidewire.  We then elected to make a sphincterotomy in  place of the previous stent.  Throughout this timeframe, we had to wait for  peristalsis to slow.  We used Robinul 0.2 mg, glucagon 1.5 mg, and of  course, Cetacaine spray had been used.  Once I felt that the sphincterotomy  had been made satisfactorily, I decided to then terminate the procedure and  withdrew the scope and guidewire.  [name] vital signs and pulse  oximeter remained stable.  The patient tolerated procedure well without  apparent complications.   FINDINGS:  The previously located stent removed, sphincterotomy performed.   PLAN:  Await clinical response and have patient follow up with me.           ______________________________  Georgiana Spinner, M.D.     GMO/MEDQ  D:  06/24/2006  T:  06/25/2006  Job:  [id number]

## 2011-02-09 NOTE — Discharge Summary (Signed)
NAMEDARLY, FAILS               ACCOUNT NO.:  [id number]   MEDICAL RECORD NO.:  [id number]          PATIENT TYPE:  INP   LOCATION:  6703                         FACILITY:  [facility]   PHYSICIAN:  Lonia Blood, M.D.      DATE OF BIRTH:  05-31-1927   DATE OF ADMISSION:  05/24/2006  DATE OF DISCHARGE:  05/27/2006                                 DISCHARGE SUMMARY   PRIMARY CARE PHYSICIAN:  [facility].   DISCHARGE DIAGNOSES:  1. Recurrent acute pancreatitis.  2. Acute cholangitis secondary to gram-negative rods with gram-negative      rod bacteremia.  3. Type 2 diabetes.  4. Dyslipidemia.   DISCHARGE MEDICATIONS:  1. Flagyl 500 mg p.o. t.i.d. for 11 days.  2. Ciprofloxacin 500 mg p.o. b.i.d. for 11 days.  3. Actos 50 mg half tablet daily.  4. Aspirin half tablet daily.  5. Vytorin 20/40 one tablet daily.  6. Benazepril 5 mg daily.  7. Nexium 40 mg daily.   DISPOSITION:  The patient is currently stable.  Is feeling well.  Only has  some epigastric discomfort, otherwise is stable for discharge.  He is status  post ERCP with stent placement.   PROCEDURES PERFORMED:  CT abdomen and pelvis on May 24, 2006 showed  dilated common bile duct at 40 mm.  No mass lesions identified and no acute  abnormality.  ERCP on September 1 showed dilated common bile duct.  A common  bile duct stent was placed.  Please see Dr. Meribeth Mattes notes.   CONSULTATIONS:  Dr. Anselmo Rod and Dr. Jeani Hawking, Gastroenterology  on behalf of Dr. Virginia Rochester.   HISTORY AND PHYSICAL:  This is a 77 year old gentleman readmitted on 05/24/06  with severe acute pancreatitis.  The patient was admitted last week with the  same problem, with elevated lipase.  His condition got better almost  overnight and was discharged with a followup scheduled with Dr. Virginia Rochester.  He was  discharged on May 20, 2006.  He returned on August 31 with the same  abdominal pain, nausea, and vomiting.  At this time, his lipase was 771,  and  he was having epigastric tenderness.  He was subsequently readmitted with  acute recurrent pancreatitis.  He is status post cholecystectomy; however,  he keeps getting what appears to be common bile duct stones.  The patient  was subsequently admitted for further management.   HOSPITAL COURSE:  1. Recurrent pancreatitis.  The patient may be passing some huge stones.      His lipase was 771 on admission.  However, it dropped to 36 the      following day.  Within 24 hours, his symptoms have resolved.  Workup,      including CT, as indicated above.  Attempt was made to do an MRCP.      However, the patient has a metal in place.  ERCP was subsequently      performed which shows a dilated common bile duct, and currently a stent      has been placed.  Since then the patient has felt better  and has been      eating a full meal.  He will therefore be discharged.  Follow up with      Dr. Virginia Rochester and his primary care physician within the next 2 weeks.  He      might require a repeat ERCP at some point with sphincterotomy.  2. Acute cholangitis.  The patient has gram-negative rod bacteremia with      fever and leukocytosis.  This most likely is secondary to some form of      cholangitis.  He has been on Cipro and Flagyl since admission and will      continue that at this time.  He will continue for 14 days.  3. Gram-negative rod bacteremia.  Again, due to gram-negative rod      bacteremia as well as gram-positive cocci.  The patient likely has a      mix of gram-negative and anaerobic infections.  This most likely is      from cholangitis with bacteremia.  Will treat it for a full 2 weeks      with antibiotics.  4. Diabetes.  His blood sugar has been lately controlled.  5. Dyslipidemia.  The patient was maintained on a statin once he was able      to start taking orally.  Otherwise the patient has been very much      stable and very pleasant to deal with.      Lonia Blood, M.D.   Electronically Signed     LG/MEDQ  D:  05/27/2006  T:  05/27/2006  Job:  [id number]   cc:   Georgiana Spinner, M.D.

## 2011-02-09 NOTE — Discharge Summary (Signed)
[name]               ACCOUNT NO.:  [id number]   MEDICAL RECORD NO.:  [id number]          PATIENT TYPE:  INP   LOCATION:  6710                         FACILITY:  [facility]   PHYSICIAN:  [name], M.D.    DATE OF BIRTH:  Jan 19, 1927   DATE OF ADMISSION:  03/09/2006  DATE OF DISCHARGE:  03/12/2006                                 DISCHARGE SUMMARY   DISCHARGE DIAGNOSES:  1.  Syncope.  2.  Bilateral carotid artery stenosis, 60-80% bilateral internal carotid      artery stenosis per carotid Doppler study on June18,2007.  3.  Bilateral lacunar infarcts per CT scan of the head.  4.  Status post fall with resultant right frontal scalp hematoma.  5.  Abdominal pain.  6.  Dilated common bile duct measuring 11 mm per ultrasound of the abdomen      on June18,2007.  7.  Transaminitis possibly secondary to Vytorin.  8.  Stable 3.1 infrarenal fusiform prominence of the abdominal aorta per CT      scan of the abdomen on June16,2007.  9.  Type 2 diabetes mellitus.   SECONDARY DISCHARGE DIAGNOSES:  1.  Hypertension.  2.  Hyperlipidemia.  3.  Status post cholecystectomy.  4.  Peptic ulcer disease.  5.  Degenerative joint disease.  6.  History of transient ischemic attack.  7.  Chronic vertigo secondary to Meniere's disease.  8.  Lumbar spondylosis.  9.  Stable 5 mm right upper lobe pulmonary nodule.   DISCHARGE MEDICATIONS:  1.  Aspirin 325 mg half a tablet daily.  2.  Protonix 40 mg daily.  3.  Avandia 2 mg daily.  4.  Benazepril/hydrochlorothiazide 1 tablet daily.  5.  Vytorin do not take yet.  6.  Ranitidine do not take if you are taking Protonix.  If you are unable to      afford Protonix, continue ranitidine 150 mg b.i.d.   DISCHARGE DISPOSITION:  The patient was discharged home in improved and  stable condition on June19,2007.  He was advised to follow up with his  primary care physician, Dr. Dara Rich, in 5-7 days.  He was also advised  to follow up with vascular  surgeon, Dr. Gretta Rich, in 6 months.   CONSULTATIONS:  Dr. Tawanna Cooler Rich.   PROCEDURE PERFORMED:  1.  CT scan of the head without contrast on June16,2007:  The results      revealed old bilateral basal ganglia lacunar infarcts, right frontal      scalp hematoma, no acute intracranial findings.  2.  CT scan of the abdomen and pelvis on June16,2007:  The results revealed      no acute intra-abdominal findings.  Stable infrarenal fusiform      prominence of the abdominal aorta measuring 3.1 cm, unchanged.  CT scan      of the pelvis revealed no acute intrapelvic pathology and fat-containing      left inguinal hernia.  3.  CT scan of the chest on June16,2007:  The results revealed no acute      intrathoracic process.  No pulmonary opacity.  Right upper lobe      pulmonary nodule stable since 2005.  4.  Ultrasound of the abdomen on June18,2007:  The results revealed status      post cholecystectomy, dilated common bile duct measuring 11 mm.  No      intrahepatic biliary dilatation.   HISTORY OF PRESENT ILLNESS:  The patient is a 77 year old man, with a past  medical history significant for diabetes mellitus, hypertension, and peptic  ulcer disease, who presented to the emergency department on June16,2007 with  a chief complaint of abdominal pain, a brief blackout episode, right frontal  scalp head contusion and shortness of breath.  The patient apparently was  eating a piece of cake which he said that he swallowed whole.  Per his  history, his symptoms started soon after that.  When he arrived to the  emergency department, he was found to be hypotensive with a blood pressure  of 87/67.  The patient was therefore admitted for further evaluation and  management.   HOSPITAL COURSE:  Problem 1:  SYNCOPE:  The evaluation started in the  emergency department.  A CT scan of the head, CT scan of the chest and a CT  scan of the abdomen were all ordered to rule out acute pathology.  The CT  scan of  the head revealed a right frontal scalp hematoma; however, there  were no acute intracranial findings.  CT scan of the chest revealed no  pulmonary embolism.  CT scan of the abdomen revealed no acute intra-  abdominal findings.  Orthostatic vitals were ordered to rule out orthostatic  hypotension.  The patient was not orthostatic per the measurements.  Soon  after admission, the patient's blood pressures improved with gentle IV  fluids.  Neurologically, he was intact.  There were no focal neurological  deficits.  For further evaluation, cardiac enzymes and a carotid Doppler  study were ordered.  The cardiac enzymes were completely negative.  The  bilateral carotid Doppler study results were significant for bilateral ICA  stenosis, measuring 60-80%.  Given this finding, the patient was started on  aspirin therapy.  Vascular surgeon, Dr. Arbie Rich, was consulted.  Per Dr.  Bosie Rich assessment, the patient demonstrated no focal neurological findings.  He agreed with aspirin therapy and recommended close monitoring with serial  carotid Doppler studies every 6 months or so.  The patient will follow up  with Dr. Arbie Rich in his office in 6 months.   Additional labs were ordered to rule out thyroid disease and  hypopituitarism.  [name] TSH was within normal limits at 1.3 and his  serum cortisol was also within normal limits at 21.5.  During the hospital  course, the patient had no complaints of dizziness, shortness of breath,  abdominal pain or chest pain.  He demonstrated no further syncopal episodes  during the hospital course.  The etiology of his syncope is unclear.  The  patient may have experienced a TIA versus a vasovagal response (possibly  from a brief choking event) following his ingestion of cake.  The patient  was completely asymptomatic at the time of hospital discharge.   Problem 2:  ABDOMINAL PAIN/DILATED COMMON BILE DUCT/ELEVATED LIVER TRANSAMINASES:  The patient complained of  transient abdominal pain per  history on admission.  A CT scan of the abdomen and pelvis were ordered for  further evaluation.  In essence, the results of the CT scan revealed no  acute intra-abdominal or intra-pelvic findings.  There was mention of  a 3.1  cm fusiform prominence which apparently has been stable.  Over the course of  the hospitalization, the patient's liver transaminases were found to be  significantly elevated.  On June17, 2007, his SGOT was 501 and his SGPT was  232.  The Vytorin was discontinued because of the elevated liver  transaminases.  For further evaluation, an ultrasound of the abdomen was  ordered as well as an acute viral hepatitis panel.  The ultrasound of the  abdomen revealed the patient's history of a cholecystectomy.  The ultrasound  also revealed a dilated common bile duct measuring 11 mm but no intrahepatic  biliary dilatation.  There was no obvious evidence of stones, tumors or  strictures, although these were included in the differential diagnoses.  It  is possible that the patient's dilated common bile duct may be simply a  consequence of his history of cholecystectomy.  The patient had no abdominal  tenderness on exam. The acute viral hepatitis panel was completely negative.  An amylase and lipase were ordered as well and they were essentially within  normal limits.  Prior to hospital discharge, the patient's liver  transaminases improved with the SGOT decreasing to 63 and the SGPT  decreasing to 86.  His total bilirubin was 1.3 prior to hospital discharge.  The patient was advised to not take the Vytorin until he is reevaluated by  his primary care physician.  Liver transaminases should be monitored in the  outpatient setting.   Problem 3:  TYPE 2 DIABETES MELLITUS:  [name] capillary blood sugars  were well controlled during the hospital .  The patient was advised to  continue Avandia 2 mg daily.   Problem 4:  HYPERTENSION:  [name]  hypotension resolved quickly  following hospital admission.  His antihypertensive medications were  withheld initially.  However, as his blood pressures increased, the  benazepril/HCTZ was restarted.  The patient was advised to continue this  medication following hospital discharge.   Problem 5:  HISTORY OF PEPTIC ULCER DISEASE:  The patient was somewhat  reluctant about starting aspirin therapy.  He recalls a history of  significant peptic ulcer disease.  The patient had been treated chronically  with ranitidine 150 mg b.i.d.  However, given the extent of the bilateral  carotid artery stenosis, he was advised to reconsider aspirin therapy.  The  patient did agree to aspirin therapy.  He was started on Protonix 40 mg  daily to hopefully prevent a recurrence of active peptic ulcer disease.  He  was advised to stop the ranitidine if he was able to afford Protonix daily.  If he could not afford Protonix daily, he was advised to continue ranitidine at 150 mg b.i.d.      [name], M.D.  Electronically Signed     DF/MEDQ  D:  03/12/2006  T:  03/13/2006  Job:  [id number]   cc:   Teena Irani. Arlyce Dice, M.D.  Fax: [phone]   Larina Earthly, M.D.  [address]  [place]  [place] [address]

## 2011-02-09 NOTE — Consult Note (Signed)
NAME:  [name], [name]               ACCOUNT NO.:  [id number]   MEDICAL RECORD NO.:  [id number]          PATIENT TYPE:  INP   LOCATION:  6703                         FACILITY:  [facility]   PHYSICIAN:  Anselmo Rod, M.D.  DATE OF BIRTH:  1927/03/20   DATE OF CONSULTATION:  05/25/2006  DATE OF DISCHARGE:                                   CONSULTATION   Consult requested by Lonia Blood, M.D.   REASON FOR CONSULTATION:  Acute abdominal pain secondary to acute  pancreatitis.   ASSESSMENT:  1.?Acute biliary pancreatitis with cholangitis in a 77 year old  white male, question rule out common bile duct stone versus stricture.  2.History of peptic ulcer disease.  3.History of dyslipidemia.  4.Degenerative joint disease.  5.Spondylosis.  6.Noninsulin-dependent diabetes mellitus.  7.History of bilateral carotid artery stenosis with 60-80% stenosis noted  bilaterally.  8.History of bilateral lacunar infarcts noted on a CT of the head in 2007.  9.History of Meniere's disease.  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  11.History of transient ischemic attacks.  12.History of a right upper lung pulmonary nodule.  13.Hypertension.  14.History of laparoscopic cholecystectomy in the past.  15.Surgery on the left ankle done in 1951.   RECOMMENDATIONS:  1.Recheck LFTs and lipase today.  2.MRCP today possible.  3.Continue broad-spectrum antibiotics and PPIs today.  4.ERCP if MRCP is inconclusive.   HISTORY OF PRESENT ILLNESS:  [name] is a pleasant 77 year old gentleman  who has had problems with intractable abdominal pain associated with nausea  without vomiting and was hospitalized for similar problems and discharged on  May 20, 2006.  He did well for a few days, then had intractable abdominal  pain again yesterday and was brought to the hospital by an ambulance, when  he was readmitted with a lipase of 771.  He has done fairly well since  admission and seems to be more  comfortable today and has tolerated a clear  liquid diet.  He denies abdominal pain and nausea or vomiting, fever, chills  or rigors.  He has had abdominal CT yesterday that showed dilated bile  ducts, and therefore GI consultation was procured.  There is no history of  nausea and vomiting at the present time.  He has had a cholecystectomy in  the past but has recurrent problems with pancreatitis, and therefore  consultation is being procured for a possible ERCP.  The patient has a  history of reflux and has been on PPIs.  He also has a previous history of  ulcer disease.  There is no history of hematochezia or melena.  His appetite  has been fairly good through all of this.  His weight has been stable.  He  is hard of hearing, and therefore getting a history from him is somewhat  difficult.   PAST MEDICAL HISTORY:  See list above.   MEDICATIONS AT HOME:  Protonix, aspirin, Avandia, benazepril and Vytorin.   MEDICATIONS IN THE HOSPITAL:  1.Aspirin 81 mg per day.  2.Protonix 40 mg per day.  3.Flagyl 500 mg q.8h.  4.Avandia 2 mg daily.  5.Lotensin 20 mg  daily.  6.Vytorin 10/80 mg daily.  7.Ciprofloxacin 400 mg daily.  8.Phenergan p.r.n.  9.Oxycodone p.r.n.   ALLERGIES:  PENICILLIN.   FAMILY HISTORY:  [name] mother had an aneurysm and she is now  deceased.  The exact site of aneurysm is not clear to me.  [name]  father died of a myocardial infarction.  He has a sister with Meniere's  disease and another one with COPD.  His sister with COPD is deceased.  There  is no known family history of breast, ovarian, uterine, colon or prostate  cancer.   SOCIAL HISTORY:  He is a retired Radiographer, therapeutic.  He is  married.  There is no history of alcohol or tobacco abuse.   PHYSICAL EXAMINATION:  GENERAL:  A pleasant, elderly male lying comfortably  in bed.  VITAL SIGNS:  Stable vital signs.  He is afebrile at this time with  temperature of 98.8, pulse is 77  per minute, respirations 20, blood pressure  of 120/66.  NECK:  Supple.  CHEST:  Clear to auscultation.  S1, S2, regular.  Breath sounds somewhat  decreased at the bases.  ABDOMEN: Obese. Diffusely tender, especially in the right upper quadrant,  with mild guarding, with normal abdominal bowel sounds.  RECTAL:  Examination was deferred.   Laboratory evaluation on admission revealed a sodium of 138, potassium of  3.5, chloride of 107, CO2 24, glucose 116, BUN of 12, creatinine of 1.7,  bilirubin of 1.4, alkaline phosphatase of 237, AST 1334, ALT 622, total  protein 5.1, albumin 2.9, calcium 8.8.  Lipase was 771.  Platelet count 251,  with 84% neutrophils, white count 3.2, hemoglobin 15.3, with MCV of 93.4.   Plans are as above.  Further recommendation made in follow-up.      Anselmo Rod, M.D.  Electronically Signed    JNM/MEDQ  D:  05/25/2006  T:  05/27/2006  Job:  [id number]   cc:   Georgiana Spinner, M.D.  Marjory Lies, M.D.  Armanda Magic, M.D.

## 2011-02-09 NOTE — H&P (Signed)
NAME:  [name], [name]               ACCOUNT NO.:  [id number]   MEDICAL RECORD NO.:  [id number]          PATIENT TYPE:  INP   LOCATION:  1824                         FACILITY:  [facility]   PHYSICIAN:  Merlene Laughter. Renae Gloss, M.D.DATE OF BIRTH:  1926/10/16   DATE OF ADMISSION:  05/24/2006  DATE OF DISCHARGE:                                HISTORY & PHYSICAL   PRIMARY CARE PHYSICIAN:  Production assistant, radio.   The patient is a full code.   CHIEF COMPLAINT:  Abdominal pain.   HISTORY OF PRESENT ILLNESS:  [name] is a 77 year old gentleman who  presents today with severe abdominal pain.  He was hospitalized earlier this  month then discharged on May 20, 2006 for severe abdominal pain and had  been diagnosed with pancreatitis.  He presents today with recurrence of  intractable abdominal pain.  He was evaluated by his primary care physician  where GI evaluation has been arranged per Dr. Virginia Rochester.   Emergency department evaluation today reveals a lipase of 771. An abdominal  CT scan reveals CVD dilatation to 14 mm, suspicious for stone or stricture.  [name] is being admitted for pain control and further evaluation of GI  symptoms, including pancreatitis and biliary obstruction.   PAST MEDICAL HISTORY:  1. Significant for hypertension.  2. Non-insulin-dependent diabetes.  3. Dyslipidemia.  4. History of peptic ulcer disease.  5. Degenerative joint disease.  6. History of transient ischemic attack.  7. History of Meniere's disease.  8. History of lumbar spondylosis.  9. History of a stable pulmonary nodule in right upper lobe.  10.History of syncopal episode in 2006 with bilateral carotid artery      stenosis of 60-80%.  11.History of bilateral lacunar infarcts on CT scan of head in June, 2007.   ALLERGIES:  PENICILLIN.   MEDICATIONS:  1. Protonix 40 mg p.o. daily.  2. Aspirin 325 mg 1/2 p.o. daily.  3. Avandia 2 mg p.o. daily.  4. Benazepril 20 mg p.o. daily.  5. Vytorin  10/80 p.o. daily.   FAMILY HISTORY:  Significant for aneurysm in mother who is deceased.  Myocardial infarction in father who is deceased. He has one sister with  Meniere's disease and another with chronic obstructive pulmonary disease who  is deceased.   SOCIAL HISTORY:  The patient is married. There is no history of alcohol or  tobacco use.   REVIEW OF SYSTEMS:  As per HPI.  A complete review of systems has been  obtained.   PHYSICAL EXAMINATION:  GENERAL APPEARANCE:  Well-developed, well-nourished  white male in no acute distress.  VITAL SIGNS:  Temperature 101.4, pulse 111, respirations 22, blood pressure  100/66.  HEENT:  Tympanic membranes within normal limits bilaterally. No  oropharyngeal lesions.  NECK:  Supple, no mass; 2+ carotids, no bruits.  LUNGS:  Clear to auscultation bilaterally.  HEART:  S1, S2 regular rate and rhythm, no murmurs, rubs, or gallops.  ABDOMEN:  Soft, nondistended. Tenderness in midepigastric area, no rebound  tenderness.  EXTREMITIES:  No clubbing, cyanosis or edema.  SKIN:  Warm, intact.  NEURO:  Alert and  oriented x3. Cranial nerves intact.   LABORATORY/X-RAY RESULTS:  Abdominal CT scan as mentioned in HPI. Urinalysis  is negative. Lipase 771. Glucose 158. White cell count 3200, hemoglobin  15.3.   ASSESSMENT AND PLAN:  PROBLEM #1:  Recurrent pancreatitis. Lipase at  discharge May 20, 2006 was 20 and today is 80, indicating patient has  recurrent acute pancreatitis most likely secondary to biliary obstruction.  Cipro and Flagyl that were started in the emergency department will be  continued as well as pain medications.  Dr. Virginia Rochester will be consulted for  inpatient evaluation.  PROBLEM #2:  Type 2 diabetes mellitus. [name] hemoglobin A1C prior to  discharge was 6.2%, he will continue with Avandia, his outpatient regimen.  PROBLEM #3.  Hypertension, currently well controlled.           ______________________________  Merlene Laughter  Renae Gloss, M.D.     KRS/MEDQ  D:  05/24/2006  T:  05/24/2006  Job:  [id number]   cc:   [facility]

## 2011-02-09 NOTE — H&P (Signed)
NAME:  [name], [name]               ACCOUNT NO.:  [id number]   MEDICAL RECORD NO.:  [id number]          PATIENT TYPE:  INP   LOCATION:  5148                         FACILITY:  [facility]   PHYSICIAN:  Lonia Blood, M.D.      DATE OF BIRTH:  05-14-1927   DATE OF ADMISSION:  05/19/2006  DATE OF DISCHARGE:                                HISTORY & PHYSICAL   PRIMARY CARE PHYSICIAN:  Teena Irani. Arlyce Dice, MD.   PRESENTING COMPLAINT:  Abdominal pain.   HISTORY OF PRESENT ILLNESS:  The patient is a 77 year old Caucasian male  with a history of recurrent abdominal pain and dilated common bile duct.  The patient had this problem back in June in 2007 and was seen in the  hospital.  He also has other multiple medical problems that were seen  including previous TIAs.  He woke up this morning fine but after breakfast  started having mid abdominal pain that was severe.  He denied any nausea or  vomiting.  He was brought into the emergency room where initial evaluation  showed a lipase of 276.  The patient is currently abdominal pain free.  Due  to his elevated lipase and the suspicion of pancreatitis, he is being  referred to [place] for admission.  He is currently stable as indicated.  No  significant abdominal pain.   PAST MEDICAL HISTORY:  1. Hypertension.  2. Non-insulin-dependent diabetes.  3. Dyslipidemia.  4. History of peptic ulcer disease.  5. Degenerative joint disease.  6. History of TIA.  7. Chronic vertigo, and possible Meniere's disease.  8. Lumbar spondylosis.  9. Stable pulmonary nodule in the right upper lobe.  10.Previous syncope in June 2006 with bilateral carotid artery stenosis of      60 to 80%.  11.Bilateral lacunar infarcts on the CT scan of the head in June 2007.  12.Status post fall with some frontal calf hematoma.  13.Recurrent abdominal pain.  14.Dilated common bile duct measuring about 11 mm per ultrasound of the      abdomen in June 2007, and increased liver enzymes at that  time.  15.Stable 3.1 mm infrarenal fusiform prominence of the abdominal aorta,      according to CT abdomen in June 2006.   ALLERGIES:  The patient is allergic to PENICILLINS.   MEDICATIONS:  1. Actos 7.5 mg daily.  2. Nexium 40 mg daily.  3. Vytorin 10/80 one tablet daily.  4. Aspirin 81 mg daily.  5. Benazepril 20 mg daily.   SOCIAL HISTORY:  The patient is married.  He is a retired Pensions consultant.  No alcohol or tobacco use.   FAMILY HISTORY:  Mother dead from aneurysm; father dead from MI at the age  of 47.  The patient has one si sister with Meniere's disease and another  sister, who is deceased from COPD.   REVIEW OF SYSTEMS:  A 12-point review of systems is performed.  The patient  denied any other symptoms except for HPI.   PHYSICAL EXAMINATION:  VITAL SIGNS:  Temperature is 97.2, blood pressure  123/65, his  pulse 87, respiratory rate 22, his SATs are 98% on room air.  GENERAL:  He is an awake, alert, and very pleasant man in no acute distress.  HEENT:  PERRL, EOMI; he has a hearing aid in his right ear secondary to  hearing problem.  NECK:  Supple, no evidence of JVD, no lymphadenopathy.  RESPIRATORY:  He has good air entry bilaterally.  No wheezes or rales.  CARDIOVASCULAR:  The patient has regular rate and rhythm.  ABDOMEN:  Obese, soft, and nontender with positive bowel sounds.  EXTREMITIES:  No edema, cyanosis, or clubbing.   LABORATORY DATA:  White count of 9.5, hemoglobin 12.8 with an MCV of 94.  Platelet count is 190, normal differentials.  Initial cardiac enzymes are  negative.  Lipase is 276.  Sodium 137, potassium 4.3, chloride 108, CO2 24,  glucose 215, BUN 16, creatinine 1.3, calcium 8.1, total protein 5.2, albumin  3, AST 409, ALT 136, alkaline-phosphatase 112, and total bilirubin 0.8.  He  has a chest x-ray that showed bibasilar atelectasis, stable borderline  cardiomegaly, pulmonary vascular congestion, and mild chronic interstitial  lung  disease.   ASSESSMENT:  This is a 77 year old gentleman, who is status post  cholecystectomy, but with a known history of dilated common bile duct.  The  patient is here with what appears to be acute pancreatitis.   PLAN:  1. Acute pancreatitis:  Will admit the patient, keep him NPO except for      ice chips initially, and we can advance his diet as needed.  Will also      give pain control as needed.  Will follow his lipase level closely      until resolution.  Meanwhile, we will rehydrate him adequately.  2. Diabetes:  We will check a hemoglobin A1c and also put him on sliding      scale insulin while NPO.  Once the patient has started eating, we will      put him back on his home medications.  3. GERD:  I will change his PPI to IV while he is NPO.  4. Dyslipidemia:  We will resume his Vytorin once he is stable.  5. Hypertension:  Blood pressures have been well controlled.  If needed,      we will use IV medications to control his blood pressure.  Otherwise,      the patient seems to be stable at this point, and I will continue to      monitor him in the hospital.      Lonia Blood, M.D.  Electronically Signed     LG/MEDQ  D:  05/19/2006  T:  05/19/2006  Job:  [id number]

## 2011-02-09 NOTE — H&P (Signed)
NAME:  [name], [name]                         ACCOUNT NO.:  [id number]   MEDICAL RECORD NO.:  [id number]                   PATIENT TYPE:  INP   LOCATION:  2031                                 FACILITY:  [facility]   PHYSICIAN:  Marjory Lies, M.D.                 DATE OF BIRTH:  [birth year]   DATE OF ADMISSION:  12/24/2003  DATE OF DISCHARGE:                                HISTORY & PHYSICAL   This is a 77 year old white male with past medical history significant for  AODM, elevated lipids, hypertension, GERD/ulcer, elevated LFTs of unknown  duration, and obesity.   CHIEF COMPLAINT:  My chest hurts.  My belly hurts.  I am very short of  breath.   HISTORY OF PRESENT ILLNESS:  The patient felt his normal self when he woke  up this morning, had barbeque for lunch and shortly thereafter developed  epigastric pain that radiated into his chest, developed significant  shortness of breath, was brought tot he emergency room where morphine and  nitroglycerin drip were started. Did not help his chest pain at all.  Chest  CT was done to rule out dissection.  Bibasilar atelectasis was noted.  Could  not rule out pneumonia.  Dr. Mayford Knife evaluated him and felt this was not his  heart and subsequently asked me to see him.   When I saw him, his rectal temperature was 102; he had rigors.  He denied  any urinary complaints, any diarrhea, any vomiting until his IV  nitroglycerin was stopped.  He has had his gallbladder removed in the past.   PAST MEDICAL HISTORY:   HOSPITALIZATIONS/SURGERIES:  He has been admitted for a bleeding ulcer and  to have his gallbladder removed.  He nor the lady in the room can give me  any more information.  He is allergic to PENICILLIN from which he has a  rash.  He takes four medicines.  They do not know the name of any of these.  One is for diabetes, hypertension, elevated lipids, and peptic ulcer  disease.   ILLNESSES:  As stated above.   SOCIAL HISTORY:  He does not  smoke, drink, or use illicit drugs, lives with  his wife.   FAMILY HISTORY:  Positive for heart disease.   REVIEW OF SYSTEMS:  Essentially impossible to obtain but denies any problems  prior to this afternoon.   PHYSICAL EXAMINATION:  VITAL SIGNS: Blood pressure 151/77, pulse 72 and  regular, respirations 20 and unlabored, temperature rectally is 102.  HEENT:  Normocephalic and atraumatic.  Pupils equal, round, and reactive to  light.  TMs and external auditory canals okay.  Oropharynx:  Mucous  membranes are moist.  NECK:  Supple without thyroid enlargement or bruits.  LUNGS:  No rales, wheezes, rhonchi.  COR:  Normal S1, S2.  No murmurs, gallops, or rubs.  ABDOMEN:  Soft, positive bowel sounds.  Epigastric area is nontender,  and no  suprapubic discomfort.  No peritoneal signs.  RECTAL:  Deferred, not pertinent to admission.  EXTREMITIES:  No edema, good pulses.  NEUROLOGIC:  Nonlateralizing.   LABORATORY AND X-RAY DATA:  Sodium 142, potassium 4.0, chloride 108, CO2 28,  BUN 16, creatinine 1.4, glucose 128.  Platelets 250,000, hemoglobin 13.1,  white count 10,100 with 71 polys and 19 lymphs.  AST 186, ALT 62, alkaline  phosphatase 93, total bilirubin 0.8.   EKG reveals no acute changes.   Chest CT shows bibasilar atelectasis.  No signs of dissection or PE.   IMPRESSION AND PLAN:  1. Chest pain, per cardiology.  2. Abdominal pain and vomiting.  This may be a viral gastroenteritis since     this has been going around in the community.  He could have a common bile     duct stone.  May need to have a gastrointestinal consult.  3. Adult-onset diabetes mellitus.  Check CBGs b.i.d.  Ask family to bring     his medicines in so we can decide what he is taking.  4. Hypertension that is under good control at present.  Ask family to bring     in medications.  5. Peptic ulcer disease.  Nexium 40 p.o. daily.  Ask family to bring in     medications.  6. Elevated liver function tests.   Will check old records at office to see     how long these have been elevated.  7. Elevated lipids.  If he is on a statin, would hold it.  Ask family to     bring in medications.  Will use O2 to keep his PO2 greater than 92%.                                                Marjory Lies, M.D.    BB/MEDQ  D:  12/24/2003  T:  12/25/2003  Job:  [id number]   cc:   Peter M. [place], M.D.  [address]  [place], [place] [address]  Fax: [phone]

## 2011-02-09 NOTE — Discharge Summary (Signed)
NAME:  [name], [name]                         ACCOUNT NO.:  [id number]   MEDICAL RECORD NO.:  [id number]                   PATIENT TYPE:  INP   LOCATION:  5707                                 FACILITY:  [facility]   PHYSICIAN:  Lonia Blood, M.D.            DATE OF BIRTH:  1927-07-20   DATE OF ADMISSION:  12/24/2003  DATE OF DISCHARGE:  12/28/2003                                 DISCHARGE SUMMARY   DISCHARGE DIAGNOSES:  1. Choledocholithiasis with passed common bile duct stone.     a. Prior history of cholecystectomy.     b. CT scan of the abdomen on 12/24/2003, revealing prominence of the        common bile duct with benign 15 mm dilatation without evidence of        mass.     c. MRCP revealing no evidence of retained stones.     d. Transient elevation of LFTs - resolved.     e. Epigastric pain - resolved.  2. E. Coli bacteremia - presumed secondary to #1 - ten-day course of Cipro     therapy.  3. Normocytic anemia.     a. Low iron and low percent sat with normal total iron binding capacity.     b. Guaiac negative.     c. Ferritin 259.     d. Hemoglobin increased to 13.8 at discharge without transfusion.     e. Recommended outpatient screening colonoscopy.  4. Diabetes mellitus - controlled.  5. Hypertension - controlled.  6. History of peptic ulcer disease - asymptomatic.  7. Allergy to PENICILLIN, causing rash.   DISCHARGE MEDICATIONS:  1. Ranitidine 150 mg b.i.d.  2. Plendil 5 mg q.d.  3. Avandia 4 mg 1/2 tablet daily.  4. Ciprofloxacin 500 mg p.o. b.i.d. for five days and then stop.  5. Patient was previously on Vytorin - he is instructed not to take this     until follow up with his primary care physician.   FOLLOW UP:  The patient is instructed to follow up with his primary care  physician in seven to ten days at [facility].  If his LFTs  remain normal at that time, it would be reasonable to resume his Vytorin.  Also, outpatient follow up with Dr.  Sabino Gasser for routine screening  colonoscopy should be arranged at that time as well.   CONSULTATIONS:  1. Dr. Sabino Gasser, with gastroenterology.  2. Dr. Armanda Magic, with [facility].   PROCEDURES:  1. CT scan of the abdomen and pelvis on December 24, 2003 - slight prominence     of the common bile duct, 15 mm diameter.  Probable benign dilatation in a     post cholecystectomy patient.  Slight dilatation of the abdominal aorta,     focally below the level of the renal arteries to 3.2 cm.  2. MRCP - No evidence of retained stone.  HISTORY OF PRESENT ILLNESS:  Mr. [name] is a very pleasant 77-year-  old gentleman who presented to the hospital on date of admission with  complaints of acute onset of severe epigastric pain.  This brought with it  significant sensation of shortness of breath.   HOSPITAL COURSE:  Mr. [name] was evaluated in the emergency room initially  for what the emergency room staff felt might be chest pain.  Rule out  enzymes in the ER were unremarkable.  Dr. Armanda Magic was called to  evaluate the patient, and did not feel that the patient's symptoms were  consistent with true angina.  As a result, Dr. Marjory Lies with  [facility] was called to see the patient.  Indeed, it  ultimately resulted that the patient's pain was more epigastric and that his  shortness of breath was associated with this pain.  Routine blood cultures  were obtained in the emergency room.  The patient was then transferred to a  telemetry bed for monitoring and further evaluation.  On the second day of  hospitalization, the two blood cultures obtained in the ER returned positive  for E. Coli.  The exact etiology of this was not clear.  Urinalysis was  obtained and was unremarkable.  PSA was obtained and was normal.  CT scan of  the abdomen was obtained and did reveal a dilated common bile duct.  CMIT  was obtained and revealed elevated LFTs.  Overall, the picture  was  concerning for choledocholithiasis.  Dr. Sabino Gasser with gastroenterology  was consulted.  Consideration was given to colonoscopy, as detailed below,  as well as EGD.  Ultimately an MRCT was obtained to evaluate the patient's  biliary tract.  This revealed a dilated tract consistent with passing of a  gall stone.  No retained stones were appreciated.  No other abnormalities  were appreciated.  It was, therefore, felt that the patient's elevated LFTs,  E. Coli bacteremia and epigastric pain were all likely secondary to  choledocholithiasis with passage of a stone through the common bile duct.  [name] symptoms had resolved completely.  He was able to tolerate a  full regular diet without difficulty.  The patient was, therefore, cleared  for discharge home.   The patient was appreciated to have a significant normocytic anemia during  his hospitalization with hemoglobin dipping as low as 11.7.  This was with  IV fluid administration.  Nonetheless, iron panel was obtained and revealed  a low iron, low percent sat, and a normal total iron binding capacity.  This  pattern is all consistent with iron deficiency anemia and concerning in this  77 year old gentleman.  As a result, it is recommended that outpatient  routine screening colonoscopy be pursued.  This was discussed with the  patient and he will follow up with Dr. Virginia Rich to schedule such.   In the setting of the patient's elevated transaminase the decision was made  to hold his Vytorin initially.  At time of discharge the patient's  transaminase had actually returned to normal.  His was asked to continue to  hold his Vytorin until follow up with his primary care physician.  At that  time, one could consider resuming his Vytorin if his LFTs remain normal.   The patient also had some complaints of difficulty voiding during the  hospitalization.  In fact, there was no evidence of obstruction, but the patient did have symptoms of  hesitancy initiating his stream.  Flomax  therapy was initiated during the hospitalization and he tolerated this well.  This will be continued in the outpatient setting.                                                Lonia Blood, M.D.    JTM/MEDQ  D:  12/28/2003  T:  12/29/2003  Job:  [id number]   cc:   Marjory Lies, M.D.  [address]  [place]  [place] [address]  Fax: [phone]   Georgiana Spinner, M.D.  [address]  [place]  [place] [address]  Fax: [phone]

## 2011-02-09 NOTE — Consult Note (Signed)
NAME:  [name], [name]                         ACCOUNT NO.:  [id number]   MEDICAL RECORD NO.:  [id number]                   PATIENT TYPE:  INP   LOCATION:  5707                                 FACILITY:  [facility]   PHYSICIAN:  Georgiana Spinner, M.D.                 DATE OF BIRTH:  05-29-1927   DATE OF CONSULTATION:  12/27/2003  DATE OF DISCHARGE:                                   CONSULTATION   [name] is a very nice 77 year old gentleman who is somewhat hard of  hearing whom I have been asked to see by Dr. Sharon Seller for evaluation.  The  patient was admitted with severe epigastric and substernal chest pain.  Dr.  Mayford Knife, from cardiology, was consulted to rule out cardiac disease and  cardiac enzymes were negative and subsequently, the patient developed 2/2  blood cultures positive for E. coli with an elevation of his white blood  cell count.  [name] history further is that a CAT scan was done of  his abdomen which showed a dilated common bile duct felt possibly due to his  status post cholecystectomy state.   [name] history is that he was watching television at approximately  2:30 in the afternoon on Friday when he developed severe pain to the point  where he felt that he could not breathe.  He bypassed going to [facility] and instead called emergency rescue squad who brought him to  the hospital here.  He felt that he could not breathe.  His pain was severe.  He thought that he was going to die.  The pain lasted until he got to the  hospital and received morphine and nitroglycerin.  He did have some  transient drop in his blood pressure, but has had no recurrence of the pain.  The patient states that he has not really ever had pain like that before.  He has had a cholecystectomy a number of years ago done by laparoscopic  technique.   He has had a history of, he says, a hole in his stomach that was treated, it  sounds, endoscopically.  He presented with  a GI bleed apparently.  He  remembers his stools being black as motor oil.  He does not smoke.  He does  not drink.  He has been on Zantac, Avandia, Vytorin and Plendil.  He has had  a history of hypertension, diabetes.  He has had reflux along with his  history of ulcer.  He takes Avandia, Vytorin, Plendil and ranitidine.  He  has an allergy to PENICILLIN.  As stated, he does not smoke and he does not  drink.  He gave up those habits in the past.   FAMILY HISTORY:  Negative for colon polyps and colon cancer.   Here his stool Hemoccults were negative, but his iron level shows a low  saturation and a low iron with  a low TIBC and a normal ferritin, consistent  with possible chronic disease.   OTHER LABORATORY STUDIES AT THIS TIME:  On admission his SGOT was 186, SGPT  62.  Other labs were unremarkable.  Hemoglobin was 13.1, white count 10,000.  On the following day his white count had jumped to 15,000 and his LFTs  showed SGOT of 596, SGPT of 328.  Again, the remainder of his CMET was  unremarkable and then occult blood was negative yesterday.  Lipase was  normal.  Blood cultures, 2/2, grew E. coli.   PHYSICAL EXAMINATION:  GENERAL:  Currently, he is a very pleasant man,  loquacious, but in no distress.  He is somewhat hard of hearing.  VITAL SIGNS:  His temperature is 99.2; pulse 79; blood pressure 141/88;  respiratory rate 20 and nonlabored with an O2 saturation of 96% on room air.  HEENT:  Without jaundice.  Thyroid is not grossly enlarged.  No bruits are  appreciated.  LUNGS:  Clear.  HEART:  Regular rhythm.  ABDOMEN:  Benign at this time, but he says that it was quite tender when he  came into the hospital.  RECTAL:  Deferred.   The remainder of the examination is per Dr. Sharon Seller.   IMPRESSION:  I think that the patient has a common bile duct stone as the  cause of his pain, probably arising de novo in the bowel duct.   PLAN:  At this time I will repeat liver function tests  and if they stay  elevated to the range that they are, I plan to do an ERCP.  I explained the  procedure to him, the rationale, drew him a picture and explained the risks,  the benefits, the alternatives and he wishes to proceed and we will schedule  this for tomorrow if LFTs remain elevated.                                               Georgiana Spinner, M.D.    GMO/MEDQ  D:  12/27/2003  T:  12/28/2003  Job:  [id number]   cc:   Marjory Lies, M.D.  [address]  [place]  [place] [address]  Fax: [phone]

## 2011-02-09 NOTE — Discharge Summary (Signed)
[facility]  Patient:    STRYKER, VEASEY                      MRN: [id number] Adm. Date:  [id number] Disc. Date: [id number] Attending:  Judeth Cornfield Dictator:   Mike Gip, P.A. CC:         Teena Irani. Arlyce Dice, M.D.             Madolyn Frieze Jens Som, M.D. [facility]             Robert D. Arlyce Dice, M.D. [facility]                           Discharge Summary  ADMITTING DIAGNOSES: 58. A 77 year old white male with acute upper gastrointestinal bleed secondary    to gastric ulcer with visible vessel. 2. Anemia secondary to above. 3. History of transient ischemic attack. 4. Hypertension. 5. Status post cholecystectomy. 6. Arthritis.  DISCHARGE DIAGNOSES: 1. Stable status post acute upper gastrointestinal bleed secondary to a    prepyloric ulcer with a visible vessel. 2. Anemia, acute secondary to blood loss, stable. 3. H. pylori antibody positive. 4. Chest pain probably noncardiac for outpatient stress Cardiolite. 5. Other diagnoses as listed above.  CONSULTATION:  Cardiology:  Dr. Jens Som.  HISTORY:  Mr. Loudermilk is a very nice 77 year old white male known to Dr. Arlyce Dice who has a history of arthritis, remote peptic ulcer disease, status post cholecystectomy, and has had several TIAs.  He is maintained on chronic aspirin therapy 325 mg p.o. q.d. as well as Plavix.  He presented to the office on the day of admission with complaints of melena and weakness.  Was found to have a hemoglobin of 9.8.  He underwent upper endoscopy emergently with Dr. Arlyce Dice.  Was found to have a prepyloric ulcer that was actively bleeding.  This was injected with epinephrine and he was transported from the office endoscopy unit to the hospital for stabilization.  Apparently he had been experiencing quite a bit of weakness over the past five or six days and had also had a decreased appetite and 10 pounds weight loss over the past three to four weeks.  He had been complaining of some gnawing  epigastric discomfort, but no nausea or vomiting.  No dysphagia or odynophagia.  No complaints of abdominal pain.  He had denied any chest pain or dyspnea at the time of admission.  He is an ex-smoker.  No ETOH.  Had not been taking any regular NSAIDs, but does take aspirin on a regular basis.  LABORATORIES:  On May 14 H. pylori antibody was 2.66.  CK-MBs were negative. Troponin was slightly elevated at 0.10 x 1.  Second troponin was negative at 0.04.  On admission WBC 6.6, hemoglobin 10.7, hematocrit 31.0, MCV 90, platelet count 267.  Serial values were obtained.  On May 16 hemoglobin was 10.1, hematocrit 29.1.  On May 17 hemoglobin was 9.9, hematocrit 29. Electrolytes within normal limits.  BUN 15, creatinine 0.9, glucose 116, albumin 3.2.  Liver function studies within normal limits.  Urinalysis was negative.  EKG on May 16 showed a normal sinus rhythm with left axis deviation.  On May 15 he had normal sinus rhythm, left anterior vesicular block.  HOSPITAL COURSE:  Patient was admitted to the service of Dr. Melvia Heaps and then cared for by Dr. Marina Goodell who was covering the hospital.  He was initially placed in the ICU for close  monitoring.  Was kept n.p.o.  Started on IV fluids.  Started on IV Protonix.  He had a stable night and the following morning was begun on a clear liquid diet.  He did not require transfusion and his hemoglobin stabilized quickly.  He was kept off of his aspirin and Plavix. Patient had complaint of chest pain and dyspnea on May 15.  CK-MBs were drawn. His initial troponin was slightly elevated.  Second was normal.  CKs were negative.  He was evaluated by cardiology.  His chest pain was felt to be non-exertional and possibly related to his GI problems.  He did have an abnormal EKG, but apparently no changes since 1999.  It was felt that he should undergo outpatient dobutamine Cardiolite and this was arranged for him. By May 16 he was feeling better.  His diet  was advanced.  He was continuing to have some dark stool, but not gross melena.  He was transferred out of the ICU and his activity level was increased as well.  On the morning of May 17 he was felt to be in stable condition with no further evidence for active bleeding. Hemoglobin was 9.9 which was stable.  His H. pylori antibody had returned positive and he was started on treatment.  It was felt that he was stable for discharge to home with instructions to follow up with Dr. Arlyce Dice on May 25 at 10:45 a.m. and to call for any problems in the interim.  He was arranged for a dobutamine Cardiolite at the [place] office on June 13 at 8:45 a.m.  We also suggested he make a routine follow-up with his primary care physician, Dr. Arlyce Dice, at [facility].  DISCHARGE DIET:  Regular.  DISCHARGE MEDICATIONS: 1. Protonix 40 mg b.i.d. x 2 weeks, then q.d. thereafter. 2. Biaxin 500 mg b.i.d. x 2 weeks. 3. Flagyl 500 mg b.i.d. x 2 weeks. 4. Plendil 2.5 mg p.o. q.d. 5. Patient is to hold his aspirin and Plavix until he is seen in the office by    Dr. Melvia Heaps.  Will probably be able to resume both of these within    two to three weeks. DD:  02/08/00 TD:  02/12/00 Job: [id number]

## 2011-02-09 NOTE — Consult Note (Signed)
NAME:  [name], [name]                         ACCOUNT NO.:  [id number]   MEDICAL RECORD NO.:  [id number]                   PATIENT TYPE:  INP   LOCATION:  2031                                 FACILITY:  [facility]   PHYSICIAN:  Armanda Magic, M.D.                  DATE OF BIRTH:  July 15, 1927   DATE OF CONSULTATION:  12/25/2003  DATE OF DISCHARGE:                                   CONSULTATION   CHIEF COMPLAINT:  Abdominal, epigastric, chest pain, rigors, and shortness  of breath.   HISTORY OF PRESENT ILLNESS:  This is a very pleasant 77 year old white male  who was in his usual state of health with a history of noninsulin-dependent  diabetes mellitus, hyperlipidemia, and hypertension.  He presented to the  emergency room with complaints of epigastric, upper abdominal pain, along  with shortness of breath.  This discomfort started around 3 p.m. today  several hours after eating lunch where he ate at [facility] and had barbecue.  The pain initially lasted about 20 minutes and then resolved.  It was  associated with shortness of breath.  It then began again in his upper  abdomen which he said radiated then up into the epigastrium and the  midsternal area of his chest but primarily the worse pain was in his upper  abdomen.  Again, it was associated with shortness of breath and his daughter  said also he became pale.   He presented to the emergency room.  He was noted to have a heart rate  anywhere from 50 to 70 beats per minute.  His heart rate would transiently  decrease in the 50s with pain.  Pain waxed and waned in the emergency room.  He was given a total of 20 mg of morphine IV and IV nitroglycerin drip was  titrated up to 60 mcg per minute without any improvement in his pain.  The  concern for an aortic dissection prompted a chest CT which ruled out aortic  dissection or pulmonary embolus.  There was noted some bibasilar  atelectasis.  No intra-abdominal pathology was noted, although the  patient  was not given p.o. oral contrast.   The patient was subsequently given a GI cocktail and on my presentation to  see the patient he is completely pain-free. His initial EKG showed no acute  ischemia and cardiac enzymes were negative x 1.   PAST MEDICAL HISTORY:  1. Significant for hypertension.  2. Hyperlipidemia.  3. Diabetes mellitus, diet controlled.   DISCHARGE MEDICATIONS:  The patient currently does not know what medicines  he is on.  He knows he is on a blood pressure medicine and a lipid medicine.   ALLERGIES:  PENICILLIN which he developed a severe rash from several years  ago.   SOCIAL HISTORY:  He is married.   FAMILY HISTORY:  Noncontributory.   REVIEW OF SYMPTOMS:  Positive only for rigors that  have developed  subsequently in the emergency room.   PHYSICAL EXAMINATION:  VITAL SIGNS:  Blood pressure is 151/77, heart rate  82.  He was initially afebrile at 97 when I initially saw him but then  developed severe rigors and temperature is now 99.2.  GENERAL:  He is a well-developed, well-nourished male lying in bed with  rigors.  HEENT:  Benign.  NECK:  Supple without lymphadenopathy and no bruits.  LUNGS:  Decreased breath sounds at the bases.  Otherwise clear to  auscultation.  HEART:  Regular rate and rhythm.  No murmurs, rubs, or gallops.  Normal S1  and S2.  ABDOMEN:  Soft.  Mild tenderness to palpation over the upper abdomen,  nondistended.  Positive bowel sounds.  No hepatosplenomegaly.  EXTREMITIES:  No cyanosis, erythema or edema.   LABORATORY DATA:  Sodium of 142, potassium 4, chloride 108, bicarbonate of  28, BUN 16, creatinine 1.4, glucose 128.  White cell count 10.1 with 77%  neutrophils.  Hemoglobin 13.1, hematocrit 39.1, platelet count 250,000.  CPK-  MB is less than 1.  Troponin is less than 0.05.  Myoglobin is 40.6.  AST  186, ALT 62, alkaline phosphate 93, total bilirubin 0.8.   EKG shows sinus bradycardia, normal sinus rhythm with sinus  arrhythmia, left  axis deviation, poor R-wave progression anteriorly.   ASSESSMENT:  1. Abdominal, epigastric, and chest pain not improved after morphine 20 mg     intravenously and nitroglycerin 60 mcg per minute.  It was improved and     resolved after gastrointestinal cocktail and after his chest CT.  Cardiac     enzymes are negative x 1.  Electrocardiogram shows no ischemia.  Chest CT     is negative for dissection or pulmonary embolus.  Of note, it does show     some bibasilar atelectasis, question of early pneumonia.  I question     whether they may be an intraabdominal process going on given his sudden     onset of rigors after pain resolved, elevated liver function tests, and     now fever.  I do not think this represents an acute cardiac ischemic     episode.  Differential diagnoses include an acute abdominal process or     infectious process or early pneumonia given the chest CT which shows     atelectasis bilaterally at the bases.  2. Elevated liver function tests, questionable etiology.  3. Hypertension, stable.  4. Hyperlipidemia.  5. Diabetes mellitus.   PLAN:  Primary doctor to admit for further workup.  We will follow along  with serial cardiac enzymes.  Check CPK-MB and troponins q.8h. x 3.  Again  no evidence of acute MI on EKG and enzymes are negative x 1.  Would not  anticoagulate this patient unless his cardiac enzymes decomposite.  We have  discontinued the nitroglycerin drip due to decreased systolic blood  pressures in the 70s.  Recheck CBC with differential now.  Blood cultures x  2.                                               Armanda Magic, M.D.    TT/MEDQ  D:  12/24/2003  T:  12/26/2003  Job:  [id number]   cc:   Teena Irani. Arlyce Dice, M.D.  [address]  [place]  [place] [address]  Fax: [phone]

## 2011-02-15 ENCOUNTER — Ambulatory Visit (HOSPITAL_COMMUNITY): Payer: No Typology Code available for payment source

## 2011-02-15 ENCOUNTER — Ambulatory Visit (HOSPITAL_COMMUNITY)
Admission: RE | Admit: 2011-02-15 | Discharge: 2011-02-15 | Disposition: A | Discharge status: Home / Self Care | Payer: No Typology Code available for payment source | Source: Ambulatory Visit | Attending: Gastroenterology | Admitting: Gastroenterology

## 2011-02-15 DIAGNOSIS — K859 Acute pancreatitis without necrosis or infection, unspecified: Secondary | ICD-10-CM

## 2011-02-15 DIAGNOSIS — K8020 Calculus of gallbladder without cholecystitis without obstruction: Secondary | ICD-10-CM

## 2011-02-15 DIAGNOSIS — E785 Hyperlipidemia, unspecified: Secondary | ICD-10-CM | POA: Insufficient documentation

## 2011-02-15 DIAGNOSIS — K838 Other specified diseases of biliary tract: Secondary | ICD-10-CM | POA: Insufficient documentation

## 2011-02-15 DIAGNOSIS — R1033 Periumbilical pain: Secondary | ICD-10-CM | POA: Insufficient documentation

## 2011-02-15 DIAGNOSIS — I10 Essential (primary) hypertension: Secondary | ICD-10-CM | POA: Insufficient documentation

## 2011-02-15 LAB — HEPATIC FUNCTION PANEL
ALT: 48 U/L (ref 0–53)
AST: 18 U/L (ref 0–37)
Albumin: 3 g/dL — ABNORMAL LOW (ref 3.5–5.2)
Alkaline Phosphatase: 155 U/L — ABNORMAL HIGH (ref 39–117)
Total Bilirubin: 0.5 mg/dL (ref 0.3–1.2)

## 2011-02-25 ENCOUNTER — Other Ambulatory Visit: Payer: Self-pay | Admitting: Cardiovascular Disease

## 2011-02-27 ENCOUNTER — Other Ambulatory Visit: Payer: Self-pay | Admitting: Cardiovascular Disease

## 2011-03-09 ENCOUNTER — Other Ambulatory Visit: Payer: Self-pay | Admitting: *Deleted

## 2011-03-09 DIAGNOSIS — E119 Type 2 diabetes mellitus without complications: Secondary | ICD-10-CM

## 2011-03-09 DIAGNOSIS — E785 Hyperlipidemia, unspecified: Secondary | ICD-10-CM

## 2011-03-12 ENCOUNTER — Other Ambulatory Visit: Payer: Self-pay | Admitting: Cardiology

## 2011-03-12 ENCOUNTER — Encounter: Payer: Self-pay | Admitting: Cardiology

## 2011-03-12 ENCOUNTER — Ambulatory Visit: Payer: No Typology Code available for payment source | Admitting: Cardiology

## 2011-03-12 ENCOUNTER — Other Ambulatory Visit (INDEPENDENT_AMBULATORY_CARE_PROVIDER_SITE_OTHER): Payer: No Typology Code available for payment source | Admitting: *Deleted

## 2011-03-12 ENCOUNTER — Ambulatory Visit (INDEPENDENT_AMBULATORY_CARE_PROVIDER_SITE_OTHER): Payer: No Typology Code available for payment source | Admitting: Cardiology

## 2011-03-12 DIAGNOSIS — E119 Type 2 diabetes mellitus without complications: Secondary | ICD-10-CM

## 2011-03-12 DIAGNOSIS — I2109 ST elevation (STEMI) myocardial infarction involving other coronary artery of anterior wall: Secondary | ICD-10-CM

## 2011-03-12 DIAGNOSIS — E785 Hyperlipidemia, unspecified: Secondary | ICD-10-CM

## 2011-03-12 DIAGNOSIS — I10 Essential (primary) hypertension: Secondary | ICD-10-CM

## 2011-03-12 DIAGNOSIS — I441 Atrioventricular block, second degree: Secondary | ICD-10-CM

## 2011-03-12 LAB — BASIC METABOLIC PANEL
BUN: 11 mg/dL (ref 6–23)
CO2: 28 mEq/L (ref 19–32)
Calcium: 8.5 mg/dL (ref 8.4–10.5)
Creatinine, Ser: 1.1 mg/dL (ref 0.4–1.5)
Glucose, Bld: 126 mg/dL — ABNORMAL HIGH (ref 70–99)

## 2011-03-12 LAB — CBC WITH DIFFERENTIAL/PLATELET
Basophils Relative: 0 % (ref 0.0–3.0)
Eosinophils Absolute: 0.1 10*3/uL (ref 0.0–0.7)
MCHC: 34.4 g/dL (ref 30.0–36.0)
MCV: 94.8 fl (ref 78.0–100.0)
Monocytes Absolute: 0.5 10*3/uL (ref 0.1–1.0)
Neutrophils Relative %: 64.4 % (ref 43.0–77.0)
RBC: 3.58 Mil/uL — ABNORMAL LOW (ref 4.22–5.81)

## 2011-03-12 LAB — HEPATIC FUNCTION PANEL
ALT: 23 U/L (ref 0–53)
AST: 27 U/L (ref 0–37)
Bilirubin, Direct: 0.1 mg/dL (ref 0.0–0.3)
Total Bilirubin: 0.4 mg/dL (ref 0.3–1.2)

## 2011-03-12 LAB — LIPID PANEL: HDL: 65.4 mg/dL (ref >39.00)

## 2011-03-12 LAB — LDL CHOLESTEROL, DIRECT: Direct LDL: 126.1 mg/dL

## 2011-03-12 NOTE — Progress Notes (Signed)
[name] Date of Birth: [birth year]   History of Present Illness: [name] is seen for followup status post acute anterior myocardial infarction in April of this year treated with direct stenting of the mid LAD. He also had second-degree heart block with significant bradycardia that was symptomatic and he underwent a permanent pacemaker. His pacemaker followup was unremarkable. His wife reports today that he just doesn't feel well. He denies any chest pain or shortness of breath. He has been eating well. He did go to the hospital May 24 with acute abdominal pain and it was felt that he is passed another gallstone.  Current Outpatient Prescriptions on File Prior to Visit  Medication Sig Dispense Refill  . acetaminophen (TYLENOL) 325 MG tablet Take 650 mg by mouth as needed.        Marland Kitchen amLODipine (NORVASC) 10 MG tablet TAKE 1/2 TABLET BY MOUTH TWICE A DAY  30 tablet  6  . aspirin 325 MG EC tablet Take 325 mg by mouth daily.       . benazepril (LOTENSIN) 20 MG tablet Take 20 mg by mouth daily.        . carvedilol (COREG) 12.5 MG tablet TAKE 1 TABLET BY MOUTH TWICE A DAY WITH MEALS  60 tablet  0  . donepezil (ARICEPT) 5 MG tablet Take 5 mg by mouth at bedtime.        . fesoterodine (TOVIAZ) 4 MG TB24 Take 4 mg by mouth daily.        . hydrochlorothiazide 25 MG tablet Take 12.5 mg by mouth daily.        . magnesium oxide (MAG-OX) 400 MG tablet Take 400 mg by mouth daily.        . metformin (FORTAMET) 500 MG (OSM) 24 hr tablet Take 500 mg by mouth 2 (two) times daily with a meal.        . Multiple Vitamins-Minerals (CENTRUM SILVER PO) Take by mouth daily.        . nitroGLYCERIN (NITROSTAT) 0.4 MG SL tablet Place 0.4 mg under the tongue every 5 (five) minutes as needed.        Marland Kitchen omeprazole (PRILOSEC) 20 MG capsule Take 20 mg by mouth 2 (two) times daily.        Marland Kitchen PLAVIX 75 MG tablet TAKE 1 TABLET BY MOUTH EVERY DAY WITH A MEAL  30 tablet  1  . simvastatin (ZOCOR) 80 MG tablet Take 40 mg by  mouth at bedtime. Monday, Wednesday and Friday only        Allergies  Allergen Reactions  . Doxycycline   . Penicillins   . Statins     Past Medical History  Diagnosis Date  . Chest discomfort   . Coronary artery disease   . Diabetes mellitus   . Hypertension   . Hyperlipidemia   . Obesities, morbid   . Gallstone pancreatitis     recurrent  . TIA (transient ischemic attack)   . Bacteremia due to vancomycin resistant Enterococcus   . GERD (gastroesophageal reflux disease)   . Hearing impairment   . Myocardial infarction, anterior wall, subsequent care   . Second degree AV block, Mobitz type II     Past Surgical History  Procedure Date  . Cholecystectomy   . Pacemaker placement     History  Smoking status  . Former Smoker -- 1.0 packs/day for 57 years  . Types: Cigarettes  . Quit date: 01/12/1996  Smokeless tobacco  . Never Used  History  Alcohol Use No    History reviewed. No pertinent family history.  Review of Systems: The review of systems is positive for generalized fatigue.  He denies any current abdominal pain. His bowel movements have been normal. He has had no dysuria. He denies any edema, orthopnea, or dyspnea.All other systems were reviewed and are negative.  Physical Exam: BP 102/62  Pulse 60  Ht 5\' 7"  (1.702 m)  Wt 188 lb (85.276 kg)  BMI 29.44 kg/m2 He is an elderly overweight white male who is very hard of hearing. He is in no distress. HEENT exam reveals he is wearing hearing aid said. His pupils are equal round and reactive. Sclera are clear. Oropharynx is clear with poor dentition. Neck is without JVD, adenopathy, or bruits. Lungs are clear. Cardiac exam reveals a regular rate and rhythm without gallop, murmur, or click. His pacemaker site has healed completely. Abdomen is soft and nontender without masses or hepatosplenomegaly. Femoral and pedal pulses are 2+ and symmetric. He has no edema. LABORATORY DATA: Blood work today demonstrates  normal chemistries with exception of a sodium of 134. His A1c is 6.1%. LDL cholesterol is 126. Hemoglobin is 11.7.  Assessment / Plan:

## 2011-03-12 NOTE — Assessment & Plan Note (Signed)
Mr. Hritz is having no significant anginal symptoms. He is on appropriate therapy. He will need to continue dual antiplatelet therapy for preferably one year. I have encouraged him to increase his activity. I'll see him back again in 4 months.

## 2011-03-12 NOTE — Assessment & Plan Note (Addendum)
Blood pressure is well controlled today. I will discontinue his Norvasc at this time.

## 2011-03-12 NOTE — Assessment & Plan Note (Signed)
Status post permanent DDD pacemaker implant. He will have establish followup with our pacemaker clinic.

## 2011-03-12 NOTE — Patient Instructions (Addendum)
Stop amlodipine (Norvasc).  We will call with the results of his blood work.  I will plan on follow up in 4 months.  Restrict sodium intake NO SALT!!!  You need to walk some every day.

## 2011-03-12 NOTE — Assessment & Plan Note (Signed)
his LDL is not at goal. He is only taking his statin 3 days a week because of a history of intolerance. I recommended the addition of Zetia 10 mg daily.

## 2011-03-13 ENCOUNTER — Telehealth: Payer: Self-pay | Admitting: Cardiology

## 2011-03-13 MED ORDER — EZETIMIBE 10 MG PO TABS: 10.0000 mg | ORAL_TABLET | Freq: Every day | ORAL | Status: DC

## 2011-03-13 NOTE — Telephone Encounter (Signed)
Lab results reported to wife due to pt being hard of hearing.  Verbalizes understanding of Dr Elvis Coil recommendation to add Zetia 10mg  daily to his current meds. Will send in to [facility] [address].

## 2011-03-13 NOTE — Telephone Encounter (Signed)
Message copied by Karle Plumber on Tue Mar 13, 2011  9:22 AM ------      Message from: [place], PETER M      Created: Mon Mar 12, 2011  4:44 PM       Chemistries look good, CBC is good, LDL is not at goal of 70. He is intolerant of higher statin doses. I would recommend adding Zetia 10 mg daily to his current meds.

## 2011-03-30 ENCOUNTER — Other Ambulatory Visit: Payer: Self-pay | Admitting: Internal Medicine

## 2011-04-04 ENCOUNTER — Encounter: Payer: Self-pay | Admitting: Internal Medicine

## 2011-04-04 ENCOUNTER — Ambulatory Visit (INDEPENDENT_AMBULATORY_CARE_PROVIDER_SITE_OTHER): Payer: No Typology Code available for payment source | Admitting: Internal Medicine

## 2011-04-04 DIAGNOSIS — I2510 Atherosclerotic heart disease of native coronary artery without angina pectoris: Secondary | ICD-10-CM

## 2011-04-04 DIAGNOSIS — I441 Atrioventricular block, second degree: Secondary | ICD-10-CM

## 2011-04-04 LAB — PACEMAKER DEVICE OBSERVATION
AL IMPEDENCE PM: 437.5 Ohm
ATRIAL PACING PM: 7.4
BAMS-0003: 70 {beats}/min
BATTERY VOLTAGE: 2.993 V
RV LEAD AMPLITUDE: 12 mv

## 2011-04-04 NOTE — Patient Instructions (Signed)
Your physician wants you to follow-up in: April 2013 with Dr Johney Frame Bonita Quin will receive a reminder letter in the mail two months in advance. If you don't receive a letter, please call our office to schedule the follow-up appointment.

## 2011-04-04 NOTE — Assessment & Plan Note (Signed)
Stable No change required today  

## 2011-04-04 NOTE — Assessment & Plan Note (Signed)
Normal pacemaker function See Pace Art report No changes today  

## 2011-04-04 NOTE — Progress Notes (Signed)
The patient presents today for routine electrophysiology followup.  Since having his pacemaker implanted, the patient reports doing very well.  Today, he denies symptoms of palpitations, chest pain, shortness of breath, orthopnea, PND, lower extremity edema, dizziness, presyncope, syncope, or neurologic sequela.  The patient feels that he is tolerating medications without difficulties and is otherwise without complaint today.   Past Medical History  Diagnosis Date  . Chest discomfort   . Coronary artery disease   . Diabetes mellitus   . Hypertension   . Hyperlipidemia   . Obesities, morbid   . Gallstone pancreatitis     recurrent  . TIA (transient ischemic attack)   . Bacteremia due to vancomycin resistant Enterococcus   . GERD (gastroesophageal reflux disease)   . Hearing impairment   . Myocardial infarction, anterior wall, subsequent care   . Second degree AV block, Mobitz type II     s/p PPM by [facility] 4/12   Past Surgical History  Procedure Date  . Cholecystectomy   . Pacemaker placement 01/01/11    implanted by [facility] for mobitz II AV block    Current Outpatient Prescriptions  Medication Sig Dispense Refill  . acetaminophen (TYLENOL) 325 MG tablet Take 650 mg by mouth as needed.        Marland Kitchen aspirin 325 MG EC tablet Take 325 mg by mouth daily.       . benazepril (LOTENSIN) 20 MG tablet Take 20 mg by mouth daily.        . carvedilol (COREG) 12.5 MG tablet TAKE 1 TABLET BY MOUTH TWICE A DAY WITH MEALS  60 tablet  0  . donepezil (ARICEPT) 5 MG tablet Take 5 mg by mouth at bedtime.        . fesoterodine (TOVIAZ) 4 MG TB24 Take 4 mg by mouth daily.        . fluticasone (FLONASE) 50 MCG/ACT nasal spray Place 2 sprays into the nose daily.        . hydrochlorothiazide 25 MG tablet Take 12.5 mg by mouth daily.        . magnesium oxide (MAG-OX) 400 MG tablet Take 400 mg by mouth daily.        . metformin (FORTAMET) 500 MG (OSM) 24 hr tablet Take 500 mg by mouth 2 (two) times daily with a meal.         . Multiple Vitamins-Minerals (CENTRUM SILVER PO) Take by mouth daily.        . nitroGLYCERIN (NITROSTAT) 0.4 MG SL tablet Place 0.4 mg under the tongue every 5 (five) minutes as needed.        Marland Kitchen omeprazole (PRILOSEC) 20 MG capsule Take 20 mg by mouth 2 (two) times daily.        Marland Kitchen PLAVIX 75 MG tablet TAKE 1 TABLET BY MOUTH EVERY DAY WITH A MEAL  30 tablet  1  . promethazine (PHENERGAN) 25 MG tablet Take 25 mg by mouth every 6 (six) hours as needed.        . simvastatin (ZOCOR) 80 MG tablet Take 40 mg by mouth at bedtime. Monday, Wednesday and Friday only        Allergies  Allergen Reactions  . Doxycycline   . Penicillins   . Statins     History   Social History  . Marital Status: Married    Spouse Name: N/A    Number of Children: 1  . Years of Education: N/A   Occupational History  . worked for city- [employer]  retired   Social History Main Topics  . Smoking status: Former Smoker -- 1.0 packs/day for 57 years    Types: Cigarettes    Quit date: 01/12/1996  . Smokeless tobacco: Never Used  . Alcohol Use: No  . Drug Use: No  . Sexually Active: Not on file   Other Topics Concern  . Not on file   Social History Narrative  . No narrative on file    Physical Exam: Filed Vitals:   04/04/11 0931  BP: 133/77  Pulse: 68  Resp: 18  Height: 5\' 6"  (1.676 m)  Weight: 184 lb (83.462 kg)    GEN- The patient is well appearing, alert and oriented x 3 today.   Head- normocephalic, atraumatic Eyes-  Sclera clear, conjunctiva pink Ears- hearing intact Oropharynx- clear Neck- supple, no JVP Lymph- no cervical lymphadenopathy Lungs- Clear to ausculation bilaterally, normal work of breathing Chest- pacemaker pocket is well healed Heart- Regular rate and rhythm, no murmurs, rubs or gallops, PMI not laterally displaced GI- soft, NT, ND, + BS Extremities- no clubbing, cyanosis, chronic R>L edema MS- no significant deformity or atrophy Skin- no rash or lesion Psych- euthymic  mood, full affect Neuro- strength and sensation are intact  Pacemaker interrogation- reviewed in detail today,  See PACEART report  ekg today revals sinus rhythm 64 bpm, diffuse TWI not significantly changed from 01/22/11  Assessment and Plan:

## 2011-04-05 ENCOUNTER — Other Ambulatory Visit: Payer: Self-pay | Admitting: Internal Medicine

## 2011-04-06 NOTE — Telephone Encounter (Signed)
escribe medication per fax request  

## 2011-04-09 ENCOUNTER — Other Ambulatory Visit: Payer: Self-pay | Admitting: Family Medicine

## 2011-04-09 ENCOUNTER — Telehealth: Payer: Self-pay | Admitting: Cardiology

## 2011-04-09 ENCOUNTER — Ambulatory Visit
Admission: RE | Admit: 2011-04-09 | Discharge: 2011-04-09 | Disposition: A | Discharge status: Home / Self Care | Payer: No Typology Code available for payment source | Source: Ambulatory Visit | Attending: Family Medicine | Admitting: Family Medicine

## 2011-04-09 DIAGNOSIS — R29810 Facial weakness: Secondary | ICD-10-CM

## 2011-04-09 MED ORDER — IOHEXOL 300 MG/ML  SOLN: 75.0000 mL | Freq: Once | INTRAMUSCULAR | Status: AC | PRN

## 2011-04-09 NOTE — Telephone Encounter (Signed)
Fax: [phone] Most recent LABS

## 2011-04-19 ENCOUNTER — Emergency Department (INDEPENDENT_AMBULATORY_CARE_PROVIDER_SITE_OTHER): Payer: No Typology Code available for payment source

## 2011-04-19 ENCOUNTER — Emergency Department (HOSPITAL_BASED_OUTPATIENT_CLINIC_OR_DEPARTMENT_OTHER): Payer: No Typology Code available for payment source

## 2011-04-19 ENCOUNTER — Encounter (HOSPITAL_BASED_OUTPATIENT_CLINIC_OR_DEPARTMENT_OTHER): Payer: Self-pay | Admitting: Student

## 2011-04-19 ENCOUNTER — Inpatient Hospital Stay (HOSPITAL_COMMUNITY)
Admission: AD | Admit: 2011-04-19 | Discharge: 2011-04-29 | DRG: 444 | Disposition: A | Discharge status: Rehabilitation Facility | Payer: No Typology Code available for payment source | Source: Other Acute Inpatient Hospital | Attending: Internal Medicine | Admitting: Internal Medicine

## 2011-04-19 ENCOUNTER — Emergency Department (HOSPITAL_BASED_OUTPATIENT_CLINIC_OR_DEPARTMENT_OTHER)
Admission: EM | Admit: 2011-04-19 | Discharge: 2011-04-19 | Disposition: A | Discharge status: Other Acute Inpatient Hospital | Payer: No Typology Code available for payment source | Source: Home / Self Care | Attending: Emergency Medicine | Admitting: Emergency Medicine

## 2011-04-19 ENCOUNTER — Other Ambulatory Visit: Payer: Self-pay

## 2011-04-19 DIAGNOSIS — Z79899 Other long term (current) drug therapy: Secondary | ICD-10-CM

## 2011-04-19 DIAGNOSIS — I252 Old myocardial infarction: Secondary | ICD-10-CM

## 2011-04-19 DIAGNOSIS — B965 Pseudomonas (aeruginosa) (mallei) (pseudomallei) as the cause of diseases classified elsewhere: Secondary | ICD-10-CM | POA: Diagnosis present

## 2011-04-19 DIAGNOSIS — R0902 Hypoxemia: Secondary | ICD-10-CM | POA: Diagnosis not present

## 2011-04-19 DIAGNOSIS — I2510 Atherosclerotic heart disease of native coronary artery without angina pectoris: Secondary | ICD-10-CM | POA: Diagnosis present

## 2011-04-19 DIAGNOSIS — R627 Adult failure to thrive: Secondary | ICD-10-CM | POA: Diagnosis not present

## 2011-04-19 DIAGNOSIS — F0390 Unspecified dementia without behavioral disturbance: Secondary | ICD-10-CM | POA: Diagnosis not present

## 2011-04-19 DIAGNOSIS — D62 Acute posthemorrhagic anemia: Secondary | ICD-10-CM | POA: Diagnosis not present

## 2011-04-19 DIAGNOSIS — K861 Other chronic pancreatitis: Secondary | ICD-10-CM | POA: Diagnosis present

## 2011-04-19 DIAGNOSIS — Z7902 Long term (current) use of antithrombotics/antiplatelets: Secondary | ICD-10-CM

## 2011-04-19 DIAGNOSIS — E872 Acidosis, unspecified: Secondary | ICD-10-CM | POA: Diagnosis not present

## 2011-04-19 DIAGNOSIS — G510 Bell's palsy: Secondary | ICD-10-CM | POA: Diagnosis not present

## 2011-04-19 DIAGNOSIS — Z880 Allergy status to penicillin: Secondary | ICD-10-CM

## 2011-04-19 DIAGNOSIS — R000 Tachycardia, unspecified: Secondary | ICD-10-CM

## 2011-04-19 DIAGNOSIS — Z950 Presence of cardiac pacemaker: Secondary | ICD-10-CM

## 2011-04-19 DIAGNOSIS — E785 Hyperlipidemia, unspecified: Secondary | ICD-10-CM | POA: Diagnosis present

## 2011-04-19 DIAGNOSIS — I498 Other specified cardiac arrhythmias: Secondary | ICD-10-CM | POA: Insufficient documentation

## 2011-04-19 DIAGNOSIS — K859 Acute pancreatitis without necrosis or infection, unspecified: Secondary | ICD-10-CM | POA: Insufficient documentation

## 2011-04-19 DIAGNOSIS — I2589 Other forms of chronic ischemic heart disease: Secondary | ICD-10-CM | POA: Diagnosis present

## 2011-04-19 DIAGNOSIS — Z7982 Long term (current) use of aspirin: Secondary | ICD-10-CM

## 2011-04-19 DIAGNOSIS — N179 Acute kidney failure, unspecified: Secondary | ICD-10-CM | POA: Diagnosis not present

## 2011-04-19 DIAGNOSIS — E875 Hyperkalemia: Secondary | ICD-10-CM | POA: Diagnosis not present

## 2011-04-19 DIAGNOSIS — Q441 Other congenital malformations of gallbladder: Secondary | ICD-10-CM

## 2011-04-19 DIAGNOSIS — B3789 Other sites of candidiasis: Secondary | ICD-10-CM | POA: Diagnosis present

## 2011-04-19 DIAGNOSIS — D638 Anemia in other chronic diseases classified elsewhere: Secondary | ICD-10-CM | POA: Diagnosis present

## 2011-04-19 DIAGNOSIS — B952 Enterococcus as the cause of diseases classified elsewhere: Secondary | ICD-10-CM | POA: Diagnosis present

## 2011-04-19 DIAGNOSIS — R109 Unspecified abdominal pain: Secondary | ICD-10-CM | POA: Insufficient documentation

## 2011-04-19 DIAGNOSIS — I5030 Unspecified diastolic (congestive) heart failure: Secondary | ICD-10-CM | POA: Diagnosis not present

## 2011-04-19 DIAGNOSIS — I509 Heart failure, unspecified: Secondary | ICD-10-CM | POA: Diagnosis not present

## 2011-04-19 DIAGNOSIS — Z9861 Coronary angioplasty status: Secondary | ICD-10-CM

## 2011-04-19 DIAGNOSIS — E7800 Pure hypercholesterolemia, unspecified: Secondary | ICD-10-CM | POA: Insufficient documentation

## 2011-04-19 DIAGNOSIS — E119 Type 2 diabetes mellitus without complications: Secondary | ICD-10-CM | POA: Diagnosis present

## 2011-04-19 DIAGNOSIS — K562 Volvulus: Secondary | ICD-10-CM

## 2011-04-19 DIAGNOSIS — T383X5A Adverse effect of insulin and oral hypoglycemic [antidiabetic] drugs, initial encounter: Secondary | ICD-10-CM | POA: Diagnosis not present

## 2011-04-19 DIAGNOSIS — F05 Delirium due to known physiological condition: Secondary | ICD-10-CM | POA: Diagnosis not present

## 2011-04-19 DIAGNOSIS — T368X5A Adverse effect of other systemic antibiotics, initial encounter: Secondary | ICD-10-CM | POA: Diagnosis not present

## 2011-04-19 DIAGNOSIS — E871 Hypo-osmolality and hyponatremia: Secondary | ICD-10-CM | POA: Diagnosis present

## 2011-04-19 DIAGNOSIS — R34 Anuria and oliguria: Secondary | ICD-10-CM | POA: Diagnosis not present

## 2011-04-19 DIAGNOSIS — T44905A Adverse effect of unspecified drugs primarily affecting the autonomic nervous system, initial encounter: Secondary | ICD-10-CM | POA: Diagnosis not present

## 2011-04-19 DIAGNOSIS — K8309 Other cholangitis: Principal | ICD-10-CM | POA: Diagnosis present

## 2011-04-19 DIAGNOSIS — R5381 Other malaise: Secondary | ICD-10-CM | POA: Diagnosis not present

## 2011-04-19 DIAGNOSIS — T3795XA Adverse effect of unspecified systemic anti-infective and antiparasitic, initial encounter: Secondary | ICD-10-CM | POA: Diagnosis not present

## 2011-04-19 DIAGNOSIS — E876 Hypokalemia: Secondary | ICD-10-CM | POA: Diagnosis not present

## 2011-04-19 DIAGNOSIS — Z8673 Personal history of transient ischemic attack (TIA), and cerebral infarction without residual deficits: Secondary | ICD-10-CM

## 2011-04-19 DIAGNOSIS — K264 Chronic or unspecified duodenal ulcer with hemorrhage: Secondary | ICD-10-CM | POA: Diagnosis not present

## 2011-04-19 DIAGNOSIS — I10 Essential (primary) hypertension: Secondary | ICD-10-CM | POA: Diagnosis present

## 2011-04-19 DIAGNOSIS — G9340 Encephalopathy, unspecified: Secondary | ICD-10-CM | POA: Diagnosis not present

## 2011-04-19 DIAGNOSIS — Z8679 Personal history of other diseases of the circulatory system: Secondary | ICD-10-CM | POA: Insufficient documentation

## 2011-04-19 DIAGNOSIS — H9190 Unspecified hearing loss, unspecified ear: Secondary | ICD-10-CM | POA: Diagnosis present

## 2011-04-19 DIAGNOSIS — K5900 Constipation, unspecified: Secondary | ICD-10-CM | POA: Diagnosis not present

## 2011-04-19 DIAGNOSIS — K219 Gastro-esophageal reflux disease without esophagitis: Secondary | ICD-10-CM | POA: Diagnosis present

## 2011-04-19 LAB — CBC
HCT: 38.6 % — ABNORMAL LOW (ref 39.0–52.0)
Hemoglobin: 13.5 g/dL (ref 13.0–17.0)
MCH: 31.5 pg (ref 26.0–34.0)
MCHC: 35 g/dL (ref 30.0–36.0)
MCV: 90.2 fL (ref 78.0–100.0)
Platelets: 300 10*3/uL (ref 150–400)
RBC: 4.28 MIL/uL (ref 4.22–5.81)
RDW: 12.7 % (ref 11.5–15.5)
WBC: 10.9 10*3/uL — ABNORMAL HIGH (ref 4.0–10.5)

## 2011-04-19 LAB — DIFFERENTIAL
Basophils Absolute: 0 K/uL (ref 0.0–0.1)
Basophils Relative: 0 % (ref 0–1)
Eosinophils Absolute: 0.1 K/uL (ref 0.0–0.7)
Eosinophils Relative: 1 % (ref 0–5)
Lymphocytes Relative: 23 % (ref 12–46)
Lymphs Abs: 2.5 10*3/uL (ref 0.7–4.0)
Monocytes Absolute: 0.7 K/uL (ref 0.1–1.0)
Monocytes Relative: 6 % (ref 3–12)
Neutro Abs: 7.6 10*3/uL (ref 1.7–7.7)
Neutrophils Relative %: 70 % (ref 43–77)

## 2011-04-19 LAB — URINALYSIS, ROUTINE W REFLEX MICROSCOPIC
Bilirubin Urine: NEGATIVE
Glucose, UA: NEGATIVE mg/dL
Hgb urine dipstick: NEGATIVE
Ketones, ur: NEGATIVE mg/dL
Leukocytes, UA: NEGATIVE
Nitrite: NEGATIVE
Protein, ur: NEGATIVE mg/dL
Specific Gravity, Urine: 1.015 (ref 1.005–1.030)
Urobilinogen, UA: 0.2 mg/dL (ref 0.0–1.0)
pH: 7 (ref 5.0–8.0)

## 2011-04-19 LAB — COMPREHENSIVE METABOLIC PANEL WITH GFR
ALT: 57 U/L — ABNORMAL HIGH (ref 0–53)
Albumin: 3.2 g/dL — ABNORMAL LOW (ref 3.5–5.2)
Alkaline Phosphatase: 185 U/L — ABNORMAL HIGH (ref 39–117)
BUN: 14 mg/dL (ref 6–23)
Calcium: 9.6 mg/dL (ref 8.4–10.5)
GFR calc Af Amer: >60 mL/min (ref >60)
Potassium: 4.2 meq/L (ref 3.5–5.1)
Sodium: 131 meq/L — ABNORMAL LOW (ref 135–145)
Total Protein: 6 g/dL (ref 6.0–8.3)

## 2011-04-19 LAB — COMPREHENSIVE METABOLIC PANEL
AST: 162 U/L — ABNORMAL HIGH (ref 0–37)
CO2: 24 mEq/L (ref 19–32)
Chloride: 95 mEq/L — ABNORMAL LOW (ref 96–112)
Creatinine, Ser: 1.1 mg/dL (ref 0.50–1.35)
GFR calc non Af Amer: >60 mL/min (ref >60)
Glucose, Bld: 124 mg/dL — ABNORMAL HIGH (ref 70–99)
Total Bilirubin: 0.8 mg/dL (ref 0.3–1.2)

## 2011-04-19 LAB — LACTIC ACID, PLASMA: Lactic Acid, Venous: 2.4 mmol/L — ABNORMAL HIGH (ref 0.5–2.2)

## 2011-04-19 LAB — PROTIME-INR
INR: 0.94 (ref 0.00–1.49)
Prothrombin Time: 12.8 s (ref 11.6–15.2)

## 2011-04-19 LAB — APTT: aPTT: 24 s (ref 24–37)

## 2011-04-19 LAB — TROPONIN I: Troponin I: <0.3 ng/mL (ref <0.30)

## 2011-04-19 LAB — LIPASE, BLOOD: Lipase: 762 U/L — ABNORMAL HIGH (ref 11–59)

## 2011-04-19 LAB — CK TOTAL AND CKMB (NOT AT ARMC)
CK, MB: 2.5 ng/mL (ref 0.3–4.0)
Relative Index: INVALID (ref 0.0–2.5)
Total CK: 37 U/L (ref 7–232)

## 2011-04-19 MED ORDER — SODIUM CHLORIDE 0.9 % IV BOLUS (SEPSIS)
500.0000 mL | Freq: Once | INTRAVENOUS | Status: AC
  Administered 2011-04-19: 500 mL via INTRAVENOUS

## 2011-04-19 MED ORDER — SODIUM CHLORIDE 0.9 % IV SOLN
Freq: Once | INTRAVENOUS | Status: AC
  Administered 2011-04-19: 19:00:00 via INTRAVENOUS

## 2011-04-19 MED ORDER — FENTANYL CITRATE 0.05 MG/ML IJ SOLN
INTRAMUSCULAR | Status: AC
  Filled 2011-04-19: qty 2

## 2011-04-19 MED ORDER — ONDANSETRON HCL 4 MG/2ML IJ SOLN
4.0000 mg | Freq: Once | INTRAMUSCULAR | Status: AC
  Administered 2011-04-19: 4 mg via INTRAVENOUS
  Filled 2011-04-19: qty 2

## 2011-04-19 MED ORDER — IOHEXOL 300 MG/ML  SOLN: 100.0000 mL | Freq: Once | INTRAMUSCULAR | Status: DC | PRN

## 2011-04-19 MED ORDER — FENTANYL CITRATE 0.05 MG/ML IJ SOLN
50.0000 ug | Freq: Once | INTRAMUSCULAR | Status: AC
  Administered 2011-04-19: 50 ug via INTRAVENOUS

## 2011-04-19 MED ORDER — SODIUM CHLORIDE 0.9 % IV BOLUS (SEPSIS): 1000.0000 mL | Freq: Once | INTRAVENOUS | Status: DC

## 2011-04-19 MED ORDER — SODIUM CHLORIDE 0.9 % IV BOLUS (SEPSIS): 500.0000 mL | Freq: Once | INTRAVENOUS | Status: DC

## 2011-04-19 NOTE — ED Notes (Signed)
Pt c/o generalized abd pain, plans for placement of  stent to pancreas tomorrow.

## 2011-04-19 NOTE — ED Notes (Signed)
Lois ( notified) of room assignment.

## 2011-04-19 NOTE — ED Notes (Signed)
Report given to [facility]  Haywood Lasso.

## 2011-04-19 NOTE — ED Provider Notes (Signed)
History     Chief Complaint  Patient presents with  . 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  Also recently diagnosed with Bell's Palsy and is on prednisone.  He was arranged to be seen by Dr. Elnoria Howard and have another stent placed at [facility] tomorrow AM, but this afternoon had severe recurrence of pain and came to the ED.  Associated with sweats. Denies CP.  Has h/o MI.  Also with h/o HTN, high cholesterol.    Patient is a 77 y.o. male presenting with abdominal pain. The history is provided by the patient and the spouse. The history is limited by the condition of the patient.  Abdominal Pain The primary symptoms of the illness include abdominal pain. The primary symptoms of the illness do not include fever, shortness of breath, vomiting, diarrhea or hematochezia. The current episode started less than 1 hour ago. The onset of the illness was sudden.    Past Medical History  Diagnosis Date  . Chest discomfort   . Coronary artery disease   . Diabetes mellitus   . Hypertension   . Hyperlipidemia   . Obesities, morbid   . Gallstone pancreatitis     recurrent  . TIA (transient ischemic attack)   . Bacteremia due to vancomycin resistant Enterococcus   . GERD (gastroesophageal reflux disease)   . Hearing impairment   . Myocardial infarction, anterior wall, subsequent care   . Second degree AV block, Mobitz type II     s/p PPM by [facility] 4/12    Past Surgical History  Procedure Date  . Cholecystectomy   . Pacemaker placement 01/01/11    implanted by [facility] for mobitz II AV block    History reviewed. No pertinent family history.  History  Substance Use Topics  . Smoking status: Former Smoker -- 1.0 packs/day for 57 years    Types: Cigarettes    Quit date: 01/12/1996  . Smokeless tobacco: Never Used  . Alcohol Use: No       Review of Systems  Unable to perform ROS Constitutional: Negative for fever.  Respiratory: Negative for shortness of breath.   Gastrointestinal: Positive for abdominal pain. Negative for vomiting, diarrhea and hematochezia.    Physical Exam  BP 83/43  Pulse 111  Temp(Src) 97.6 F (36.4 C) (Oral)  Resp 20  Wt 182 lb (82.555 kg)  SpO2 100%  Physical Exam  Constitutional: He appears well-developed and well-nourished. He appears distressed.  HENT:  Head: Normocephalic.  Eyes: Pupils are equal, round, and reactive to light.  Neck: Normal range of motion. Neck supple.  Cardiovascular: Normal rate.   No murmur heard. Pulmonary/Chest: Breath sounds normal. Tachypnea noted. No respiratory distress. He has no wheezes. He has no rales.  Abdominal: Normal appearance. There is tenderness in the epigastric area. There is guarding. There is no rebound, no CVA tenderness and no tenderness at McBurney's point.  Musculoskeletal: He exhibits no edema and no tenderness.  Neurological: He is alert.  Skin: He is diaphoretic. No erythema. There is pallor.    ED Course  Procedures  MDM Pt likely has re-exacerbation of pancreatic pain.  Will review radiology records to see if CT is on file.  Will treat initial hypotension with IVF's, 2 have been placed thus far, will treat pain and nausea depending on  response of BP to fluids.  Will likely go ahead and transfer to [facility] for admission so that he can be seen and have procedure done by Dr. Elnoria Howard once he is more stabilized.  Will also check CKMB and troponin.  ECG shows no obvious new acute ischemia.     ED ECG REPORT   Date: 04/19/2011  EKG Time: 4:50 PM  Rate: 73  Rhythm: normal sinus rhythm,  left axis deviation  Axis: LAD  Intervals:left anterior fascicular block  ST&T Change: non specific  Narrative Interpretation: abn ECG   Time Spent in Critical Care of the patient: 30 min   [place] Y.    6:32 PM BP is much improved.  Lipase is very elevated and LFT's are much higher than prior recent labs.  CT shows no acute changes.  Given symptoms, need for procedure at [facility] tomorrow AM, will go ahead and consult hosptialsit for admission to [facility] for control of symptoms, and GI consultation.             7:04 PM Spoke to Dr. Jomarie Longs at [facility] who accepts tp to [facility] team 1 on tele bed.  wil continue IVF's, pain seems improved at this point.  Spouse made aware.    Gavin Pound. Oletta Lamas, MD 04/19/11 1905

## 2011-04-20 ENCOUNTER — Inpatient Hospital Stay (HOSPITAL_COMMUNITY): Payer: No Typology Code available for payment source

## 2011-04-20 LAB — COMPREHENSIVE METABOLIC PANEL
ALT: 320 U/L — ABNORMAL HIGH (ref 0–53)
Alkaline Phosphatase: 276 U/L — ABNORMAL HIGH (ref 39–117)
CO2: 25 mEq/L (ref 19–32)
GFR calc Af Amer: 57 mL/min — ABNORMAL LOW (ref >60)
GFR calc non Af Amer: 47 mL/min — ABNORMAL LOW (ref >60)
Glucose, Bld: 124 mg/dL — ABNORMAL HIGH (ref 70–99)
Potassium: 4.1 mEq/L (ref 3.5–5.1)
Sodium: 133 mEq/L — ABNORMAL LOW (ref 135–145)
Total Protein: 4.9 g/dL — ABNORMAL LOW (ref 6.0–8.3)

## 2011-04-20 LAB — CBC
HCT: 31.9 % — ABNORMAL LOW (ref 39.0–52.0)
Hemoglobin: 11.1 g/dL — ABNORMAL LOW (ref 13.0–17.0)
MCH: 31.4 pg (ref 26.0–34.0)
MCHC: 34.8 g/dL (ref 30.0–36.0)
MCV: 90.4 fL (ref 78.0–100.0)

## 2011-04-20 LAB — CARDIAC PANEL(CRET KIN+CKTOT+MB+TROPI)
Relative Index: INVALID (ref 0.0–2.5)
Total CK: 34 U/L (ref 7–232)

## 2011-04-21 LAB — CBC
HCT: 31.9 % — ABNORMAL LOW (ref 39.0–52.0)
Hemoglobin: 10.7 g/dL — ABNORMAL LOW (ref 13.0–17.0)
MCH: 31.1 pg (ref 26.0–34.0)
MCHC: 33.5 g/dL (ref 30.0–36.0)

## 2011-04-21 LAB — GLUCOSE, CAPILLARY
Glucose-Capillary: 89 mg/dL (ref 70–99)
Glucose-Capillary: 101 mg/dL — ABNORMAL HIGH (ref 70–99)

## 2011-04-21 LAB — COMPREHENSIVE METABOLIC PANEL
BUN: 16 mg/dL (ref 6–23)
Calcium: 8.6 mg/dL (ref 8.4–10.5)
Creatinine, Ser: 1.05 mg/dL (ref 0.50–1.35)
GFR calc Af Amer: >60 mL/min (ref >60)
Glucose, Bld: 93 mg/dL (ref 70–99)
Sodium: 135 mEq/L (ref 135–145)
Total Protein: 5.2 g/dL — ABNORMAL LOW (ref 6.0–8.3)

## 2011-04-22 LAB — COMPREHENSIVE METABOLIC PANEL
ALT: 120 U/L — ABNORMAL HIGH (ref 0–53)
CO2: 26 mEq/L (ref 19–32)
Calcium: 8.5 mg/dL (ref 8.4–10.5)
Chloride: 101 mEq/L (ref 96–112)
Creatinine, Ser: 0.87 mg/dL (ref 0.50–1.35)
GFR calc Af Amer: >60 mL/min (ref >60)
GFR calc non Af Amer: >60 mL/min (ref >60)
Glucose, Bld: 120 mg/dL — ABNORMAL HIGH (ref 70–99)
Sodium: 133 mEq/L — ABNORMAL LOW (ref 135–145)
Total Bilirubin: 0.4 mg/dL (ref 0.3–1.2)

## 2011-04-22 LAB — CBC
Hemoglobin: 11.7 g/dL — ABNORMAL LOW (ref 13.0–17.0)
MCH: 31.6 pg (ref 26.0–34.0)
MCV: 91.1 fL (ref 78.0–100.0)
RBC: 3.7 MIL/uL — ABNORMAL LOW (ref 4.22–5.81)

## 2011-04-22 LAB — GLUCOSE, CAPILLARY

## 2011-04-23 ENCOUNTER — Inpatient Hospital Stay (HOSPITAL_COMMUNITY): Payer: No Typology Code available for payment source

## 2011-04-23 LAB — URINALYSIS, ROUTINE W REFLEX MICROSCOPIC
Bilirubin Urine: NEGATIVE
Glucose, UA: NEGATIVE mg/dL
Hgb urine dipstick: NEGATIVE
Specific Gravity, Urine: 1.013 (ref 1.005–1.030)
Urobilinogen, UA: 1 mg/dL (ref 0.0–1.0)
pH: 7.5 (ref 5.0–8.0)

## 2011-04-23 LAB — CBC
HCT: 33.4 % — ABNORMAL LOW (ref 39.0–52.0)
MCH: 30.6 pg (ref 26.0–34.0)
MCHC: 33.8 g/dL (ref 30.0–36.0)
RDW: 13 % (ref 11.5–15.5)

## 2011-04-23 LAB — GLUCOSE, CAPILLARY
Glucose-Capillary: 109 mg/dL — ABNORMAL HIGH (ref 70–99)
Glucose-Capillary: 129 mg/dL — ABNORMAL HIGH (ref 70–99)

## 2011-04-23 MED ORDER — IOHEXOL 300 MG/ML  SOLN: 50.0000 mL | Freq: Once | INTRAMUSCULAR | Status: AC | PRN

## 2011-04-23 MED ORDER — IOHEXOL 300 MG/ML  SOLN
50.0000 mL | Freq: Once | INTRAMUSCULAR | Status: AC | PRN
  Administered 2011-04-23: 15 mL

## 2011-04-24 ENCOUNTER — Other Ambulatory Visit: Payer: Self-pay | Admitting: Internal Medicine

## 2011-04-24 LAB — GLUCOSE, CAPILLARY: Glucose-Capillary: 120 mg/dL — ABNORMAL HIGH (ref 70–99)

## 2011-04-24 LAB — COMPREHENSIVE METABOLIC PANEL
ALT: 52 U/L (ref 0–53)
Alkaline Phosphatase: 166 U/L — ABNORMAL HIGH (ref 39–117)
BUN: 9 mg/dL (ref 6–23)
CO2: 26 mEq/L (ref 19–32)
Chloride: 101 mEq/L (ref 96–112)
GFR calc Af Amer: >60 mL/min (ref >60)
Glucose, Bld: 112 mg/dL — ABNORMAL HIGH (ref 70–99)
Potassium: 3.6 mEq/L (ref 3.5–5.1)
Sodium: 135 mEq/L (ref 135–145)
Total Bilirubin: 0.4 mg/dL (ref 0.3–1.2)

## 2011-04-24 LAB — URINE CULTURE
Colony Count: NO GROWTH
Culture: NO GROWTH
Special Requests: NEGATIVE

## 2011-04-24 LAB — CBC
Platelets: 127 10*3/uL — ABNORMAL LOW (ref 150–400)
RBC: 3.99 MIL/uL — ABNORMAL LOW (ref 4.22–5.81)
RDW: 13 % (ref 11.5–15.5)
WBC: 7.6 10*3/uL (ref 4.0–10.5)

## 2011-04-24 LAB — MAGNESIUM: Magnesium: 2 mg/dL (ref 1.5–2.5)

## 2011-04-25 LAB — GLUCOSE, CAPILLARY
Glucose-Capillary: 122 mg/dL — ABNORMAL HIGH (ref 70–99)
Glucose-Capillary: 121 mg/dL — ABNORMAL HIGH (ref 70–99)
Glucose-Capillary: 133 mg/dL — ABNORMAL HIGH (ref 70–99)

## 2011-04-25 LAB — COMPREHENSIVE METABOLIC PANEL
ALT: 30 U/L (ref 0–53)
AST: 13 U/L (ref 0–37)
Albumin: 2.1 g/dL — ABNORMAL LOW (ref 3.5–5.2)
Calcium: 8.6 mg/dL (ref 8.4–10.5)
Creatinine, Ser: 0.72 mg/dL (ref 0.50–1.35)
GFR calc non Af Amer: >60 mL/min (ref >60)
Sodium: 137 mEq/L (ref 135–145)
Total Protein: 5.4 g/dL — ABNORMAL LOW (ref 6.0–8.3)

## 2011-04-25 LAB — CBC
HCT: 35.2 % — ABNORMAL LOW (ref 39.0–52.0)
MCH: 29.8 pg (ref 26.0–34.0)
MCV: 88.9 fL (ref 78.0–100.0)
RBC: 3.96 MIL/uL — ABNORMAL LOW (ref 4.22–5.81)
WBC: 8 10*3/uL (ref 4.0–10.5)

## 2011-04-26 DIAGNOSIS — K859 Acute pancreatitis without necrosis or infection, unspecified: Secondary | ICD-10-CM

## 2011-04-26 LAB — DIFFERENTIAL
Basophils Absolute: 0 10*3/uL (ref 0.0–0.1)
Basophils Relative: 0 % (ref 0–1)
Eosinophils Absolute: 0.1 10*3/uL (ref 0.0–0.7)
Monocytes Absolute: 0.5 10*3/uL (ref 0.1–1.0)
Neutro Abs: 5.7 10*3/uL (ref 1.7–7.7)
Neutrophils Relative %: 74 % (ref 43–77)

## 2011-04-26 LAB — COMPREHENSIVE METABOLIC PANEL
Albumin: 2.3 g/dL — ABNORMAL LOW (ref 3.5–5.2)
Alkaline Phosphatase: 139 U/L — ABNORMAL HIGH (ref 39–117)
BUN: 11 mg/dL (ref 6–23)
CO2: 26 mEq/L (ref 19–32)
Chloride: 103 mEq/L (ref 96–112)
GFR calc non Af Amer: >60 mL/min (ref >60)
Potassium: 3.8 mEq/L (ref 3.5–5.1)
Total Bilirubin: 0.4 mg/dL (ref 0.3–1.2)

## 2011-04-26 LAB — CBC
HCT: 36.7 % — ABNORMAL LOW (ref 39.0–52.0)
MCHC: 34.3 g/dL (ref 30.0–36.0)
MCV: 89.7 fL (ref 78.0–100.0)
RDW: 13.2 % (ref 11.5–15.5)

## 2011-04-26 LAB — GLUCOSE, CAPILLARY

## 2011-04-26 NOTE — Consult Note (Signed)
NAMELALO, [name]               ACCOUNT NO.:  [id number]  MEDICAL RECORD NO.:  [id number]  LOCATION:  1512                         FACILITY:  [facility]  PHYSICIAN:  Jordan Hawks. Elnoria Howard, MD    DATE OF BIRTH:  [birth year]  DATE OF CONSULTATION:  04/20/2011 DATE OF DISCHARGE:                                CONSULTATION   REASON FOR CONSULTATION:  Recurrent pancreatitis.  HISTORY:  This is an 77 year old gentleman who is well known to me, was admitted to the hospital with recurrent pancreatitis.  The patient has a past medical history of MI in April 2012 with placement of bare metal stent, status post pacemaker placement, hyperlipidemia, diabetes, hypertension, obesity, history of TIA, gastroesophageal reflux disease, second-degree AV block and history of gallstone pancreatitis, who was admitted to the hospital with recurrent pancreatitis.  The patient was scheduled to have a repeat ERCP with stenting today.  However, he had presented to the hospital last evening with recurrence of his abdominal pain.  One week ago, he also had a recurrence of his abdominal pain and his transaminases were elevated as well as his lipase consistent with his prior history of the recurrent pancreatitis.  A number of years ago, he underwent ERCP which was successful in stent placement which was performed at that time.  However, with the recurrence of his symptoms, a repeat ERCP failed as he had significant amount of redundant folds in the second portion of his duodenum.  However, the endoscopic ultrasound was clear and identified stone in the CBD. He was subsequently sent over to [facility] and at that time they were not able to identify the papilla and repeat EUS performed at that time was negative for any evidence of stones.  Unfortunately, he has had increase in his recurrence of late.  This is most likely his third one since the beginning of the year.  Fortunately, his symptoms rapidly resolve, and he  is in stable condition.  PAST MEDICAL HISTORY:  As dictated above.  PAST SURGICAL HISTORY:  As dictated above.  FAMILY HISTORY:  Noncontributory.  SOCIAL HISTORY:  Negative for alcohol, tobacco or illicit drug use.  ALLERGIES:  PENICILLIN, NYSTATIN, VIBRAMYCIN.  MEDICATIONS: 1. Fentanyl 50 mcg IV q. 2h. 2. Zofran 4 mg IV q. 4h. p.r.n.  PHYSICAL EXAMINATION:  VITAL SIGNS:  Blood pressure is 103/65, heart rate 78, respirations17, temperature is 97.8. GENERAL:  The patient is in no acute distress.  Alert and oriented. HEENT:  Normocephalic, atraumatic.  Extraocular muscles intact. NECK:  Supple.  No lymphadenopathy. LUNGS:  Clear to auscultation bilaterally. CARDIOVASCULAR:  Regular rate and rhythm. ABDOMEN:  Obese, soft, nontender and nondistended. EXTREMITIES:  No clubbing, cyanosis or edema.  LABORATORY VALUES:  White blood cell count 34.6, hemoglobin 11.1, MCV is 90.4, platelets at 200.  Sodium is 132, potassium 4.1, chloride 99, CO2 25, glucose 124, BUN 18, creatinine 1.4.  Total bilirubin is 1.0, alk phos is 276, AST is 573, ALT is 320, lipase is 64 and previously was at 762.  PT is 12.8, INR 0.9, PTT 24.  IMPRESSION AND PLAN:  Recurrent pancreatitis presumably from gallstones or sludge.  The patient was scheduled to have a  repeat ERCP with stent placement.  I will pursue the same type of intervention given his recurrence of the pancreatitis.  His white count has elevated and this is most likely secondary to his pancreatitis.  Clinically, he is stable and feels well.  Vital signs are also stable.  Plan is for EUS/ERCP and further recommendations depending on findings.     Jordan Hawks Elnoria Howard, MD     PDH/MEDQ  D:  04/20/2011  T:  04/20/2011  Job:  [id number]  Electronically Signed by Jeani Hawking MD on 04/26/2011 11:22:55 AM

## 2011-04-27 ENCOUNTER — Inpatient Hospital Stay (HOSPITAL_COMMUNITY): Payer: No Typology Code available for payment source

## 2011-04-27 LAB — CBC
Platelets: 219 10*3/uL (ref 150–400)
RDW: 13.2 % (ref 11.5–15.5)
WBC: 7.6 10*3/uL (ref 4.0–10.5)

## 2011-04-27 LAB — COMPREHENSIVE METABOLIC PANEL
ALT: 16 U/L (ref 0–53)
Alkaline Phosphatase: 121 U/L — ABNORMAL HIGH (ref 39–117)
BUN: 13 mg/dL (ref 6–23)
CO2: 22 mEq/L (ref 19–32)
Calcium: 8.7 mg/dL (ref 8.4–10.5)
GFR calc Af Amer: >60 mL/min (ref >60)
GFR calc non Af Amer: >60 mL/min (ref >60)
Glucose, Bld: 116 mg/dL — ABNORMAL HIGH (ref 70–99)
Total Protein: 5.5 g/dL — ABNORMAL LOW (ref 6.0–8.3)

## 2011-04-27 LAB — GLUCOSE, CAPILLARY: Glucose-Capillary: 118 mg/dL — ABNORMAL HIGH (ref 70–99)

## 2011-04-27 LAB — DIFFERENTIAL
Basophils Absolute: 0 10*3/uL (ref 0.0–0.1)
Lymphocytes Relative: 21 % (ref 12–46)
Monocytes Relative: 6 % (ref 3–12)

## 2011-04-27 NOTE — H&P (Signed)
NAMEYORDY, MATTON               ACCOUNT NO.:  [id number]  MEDICAL RECORD NO.:  [id number]  LOCATION:  1512                         FACILITY:  [facility]  PHYSICIAN:  Tarry Kos, MD       DATE OF BIRTH:  August 22, 1927  DATE OF ADMISSION:  04/19/2011 DATE OF DISCHARGE:                             HISTORY & PHYSICAL   CHIEF COMPLAINT:  Abdominal pain.  HISTORY OF PRESENT ILLNESS:  Mr. Hoos is an 77 year old elderly male who has a history of recent MI in April 2012, who is status post bare- metal stenting of the LAD with an EF of 45% and also during that hospitalization had gallstone pancreatitis with elevated LFTs.  He has been following GI as an outpatient and he is actually scheduled in the morning for biliary stent placement, they had been trying to do this as an outpatient.  I could get any history from Mr. Dials as he is extremely hard of hearing and it is very difficult to communicate with him and there is no family around, so all of his history most of it is taken from his past medical records and from speaking to the emergency room physician at [facility] Emergency Room.  Apparently, he went to [facility] ED because of worsening abdominal pain which was relieved with some IV pain medications over there.  There was no report of nausea or vomiting, no report of fever, and he went to the ED because of uncontrolled pain and was transferred here as he was scheduled for a biliary stent in the morning with Dr. Elnoria Howard anyway.  He is currently on the floor at [facility]  He appears comfortable.  He denies any pain, however, the rest of the history is not obtainable, but he can answer that he is not in any pain and he does not appear as if he has any pain.  REVIEW OF SYSTEMS:  Otherwise unobtainable.  PAST MEDICAL HISTORY: 1. Again recent MI, status post bare-metal stenting of the LAD in     April 2012. 2. Ischemic cardiomyopathy with an EF of 45%. 3.  History of gallstone pancreatitis, elevated LFTs with scheduled     stenting in the morning with Dr. Elnoria Howard. 4. History of second-degree AV block, status post St. Jude permanent     pacemaker. 5. Hypertension. 6. Diabetes. 7. Hyperlipidemia. 8. Obesity. 9. Status post cholecystectomy. 10.History of TIAs. 11.GERD.  SOCIAL HISTORY:  He is married.  His wife was with him, but she has now gone.  Per his records, he does not smoke, drink, alcohol, or any other drugs.  As far as I know, he lives with his wife.  He is extremely hard of hearing.  I am assuming his code status is full.  MEDICATIONS:  The patient is unsure of his medications, however, the nursing staff have asked his wife to bring them in.  It is unclear as to whether or not he has been taking his antiplatelet treatment.  I am assuming that he has been told to hold this for his procedure in the morning, but again I do not know exactly what instructions he has been given as  far as holding his medications for his procedure in the morning.  ALLERGIES: 1. PENICILLIN. 2. NYSTATIN. 3. VIBRAMYCIN.  PHYSICAL EXAMINATION:  VITAL SIGNS:  Temperature is 99.6, pulse 120s, one of his blood pressures were 94/55 at [facility] and his blood pressure currently is 122/41, respirations 20, 100% O2 saturations on 2 L. GENERAL:  He is alert and he is in no apparent distress.  He can answer yes no questions with me literally speaking extremely loudly in his ears.  He is extremely hard of hearing and most of time cannot understand what I am asking. HEENT:  Extraocular muscles intact.  Pupils equal, reactive to light. Oropharynx clear.  Mucous membranes moist. NECK:  No JVD.  No carotid bruits. COR: Tachycardic with regular rate and rhythm without murmurs or gallops. CHEST:  Clear to auscultation bilaterally.  No dry rales. ABDOMEN:  Soft, nontender, nondistended.  Positive bowel sounds.  No hepatosplenomegaly. EXTREMITIES:  No clubbing,  cyanosis, or edema. PSYCHIATRIC:  Normal affect. NEUROLOGIC:  No focal neurologic deficits.  LABORATORY DATA AND IMAGING:  EKG is normal sinus rhythm without any acute ST-T wave changes, has incomplete right bundle-branch block. Urinalysis is negative. Troponin is negative.  CK-MB is normal.  Sodium is 131, potassium is 4.2, BUN and creatinine normal.  Alk phos is slightly elevated at 185, AST is elevated at 162, ALT 57, lipase is elevated at 762.  Lactic acid level is 2.4, INR is 0.94.  White count is 10.9, hemoglobin is 13.5.  ASSESSMENT AND PLAN:  This is an 77 year old male with acute biliary pancreatitis who was supposed to be going for a biliary scan in the morning. 1. Mild acute pancreatitis.  Provide aggressive IV fluids overnight     and repeat a lipase in the morning which will probably be normal.     I have spoken to staff here to let procedures no in the morning and     Dr. Elnoria Howard to no in the morning that Mr. Wegener has been hospitalized     here and I will leave the decision up to whether or not to proceed     with the procedure per Dr. Elnoria Howard.  I will keep him n.p.o. right now,     give him his dose of Coreg that he supposed to get tonight.  We     need to clarify with his wife what he is actually been taking as     far as his medications.  He was very unclear as to what medications     he is taking, but according to his last discharge summary he was on     Coreg, I am going to continue that. 2. Recent acute myocardial infarction.  Again, need to clarify what     instructions they have been given as far as holding his Plavix for     the procedure tomorrow.  The patient cannot answer these specific     questions. 3. Diabetes.  Hold his metformin for now. 4. Extremely hard of hearing verses possible dementia.  He does say he     usually wears a hearing aid which he does not have right now, so     this may be the issue with the communication. 5. The patient is full code.   Further recommendation pending overall     hospital course.          ______________________________ Tarry Kos, MD    RD/MEDQ  D:  04/19/2011  T:  04/20/2011  Job:  [id number]  Electronically Signed by Tarry Kos MD on 04/27/2011 12:12:53 PM

## 2011-04-28 ENCOUNTER — Inpatient Hospital Stay (HOSPITAL_COMMUNITY): Payer: No Typology Code available for payment source

## 2011-04-28 LAB — DIFFERENTIAL
Basophils Relative: 1 % (ref 0–1)
Eosinophils Relative: 1 % (ref 0–5)
Monocytes Absolute: 0.4 10*3/uL (ref 0.1–1.0)
Monocytes Relative: 6 % (ref 3–12)
Neutrophils Relative %: 67 % (ref 43–77)

## 2011-04-28 LAB — BASIC METABOLIC PANEL
BUN: 11 mg/dL (ref 6–23)
CO2: 20 mEq/L (ref 19–32)
Chloride: 106 mEq/L (ref 96–112)
GFR calc non Af Amer: >60 mL/min (ref >60)
Glucose, Bld: 112 mg/dL — ABNORMAL HIGH (ref 70–99)
Potassium: 3.6 mEq/L (ref 3.5–5.1)
Sodium: 135 mEq/L (ref 135–145)

## 2011-04-28 LAB — GLUCOSE, CAPILLARY
Glucose-Capillary: 135 mg/dL — ABNORMAL HIGH (ref 70–99)
Glucose-Capillary: 104 mg/dL — ABNORMAL HIGH (ref 70–99)
Glucose-Capillary: 143 mg/dL — ABNORMAL HIGH (ref 70–99)

## 2011-04-28 LAB — CBC
HCT: 35.4 % — ABNORMAL LOW (ref 39.0–52.0)
Hemoglobin: 11.8 g/dL — ABNORMAL LOW (ref 13.0–17.0)
RBC: 3.96 MIL/uL — ABNORMAL LOW (ref 4.22–5.81)
WBC: 7.4 10*3/uL (ref 4.0–10.5)

## 2011-04-28 MED ORDER — IOHEXOL 300 MG/ML  SOLN
50.0000 mL | Freq: Once | INTRAMUSCULAR | Status: AC | PRN
  Administered 2011-04-28: 7 mL

## 2011-04-29 LAB — CULTURE, BLOOD (ROUTINE X 2)
Culture  Setup Time: 201207302326
Culture  Setup Time: 201207302326
Culture: NO GROWTH

## 2011-04-29 LAB — BASIC METABOLIC PANEL
BUN: 12 mg/dL (ref 6–23)
GFR calc Af Amer: >60 mL/min (ref >60)
GFR calc non Af Amer: >60 mL/min (ref >60)
Potassium: 3.9 mEq/L (ref 3.5–5.1)

## 2011-04-29 LAB — CK: Total CK: 17 U/L (ref 7–232)

## 2011-04-29 LAB — CBC
HCT: 37.8 % — ABNORMAL LOW (ref 39.0–52.0)
MCHC: 33.3 g/dL (ref 30.0–36.0)
Platelets: 311 10*3/uL (ref 150–400)
RDW: 13.2 % (ref 11.5–15.5)
WBC: 8.1 10*3/uL (ref 4.0–10.5)

## 2011-04-30 ENCOUNTER — Other Ambulatory Visit (HOSPITAL_COMMUNITY): Payer: Self-pay | Admitting: Internal Medicine

## 2011-04-30 DIAGNOSIS — K851 Biliary acute pancreatitis without necrosis or infection: Secondary | ICD-10-CM

## 2011-05-02 ENCOUNTER — Emergency Department (HOSPITAL_COMMUNITY)
Admission: EM | Admit: 2011-05-02 | Discharge: 2011-05-02 | Disposition: A | Discharge status: Other Acute Inpatient Hospital | Payer: No Typology Code available for payment source | Source: Home / Self Care | Attending: Emergency Medicine | Admitting: Emergency Medicine

## 2011-05-02 ENCOUNTER — Inpatient Hospital Stay (HOSPITAL_COMMUNITY)
Admission: EM | Admit: 2011-05-02 | Discharge: 2011-05-25 | Disposition: A | Discharge status: Rehabilitation Facility | Payer: No Typology Code available for payment source | Attending: Internal Medicine | Admitting: Internal Medicine

## 2011-05-02 DIAGNOSIS — E109 Type 1 diabetes mellitus without complications: Secondary | ICD-10-CM | POA: Insufficient documentation

## 2011-05-02 DIAGNOSIS — E7800 Pure hypercholesterolemia, unspecified: Secondary | ICD-10-CM | POA: Insufficient documentation

## 2011-05-02 DIAGNOSIS — D72829 Elevated white blood cell count, unspecified: Secondary | ICD-10-CM | POA: Insufficient documentation

## 2011-05-02 DIAGNOSIS — J449 Chronic obstructive pulmonary disease, unspecified: Secondary | ICD-10-CM | POA: Insufficient documentation

## 2011-05-02 DIAGNOSIS — R339 Retention of urine, unspecified: Secondary | ICD-10-CM | POA: Insufficient documentation

## 2011-05-02 DIAGNOSIS — J4489 Other specified chronic obstructive pulmonary disease: Secondary | ICD-10-CM | POA: Insufficient documentation

## 2011-05-02 DIAGNOSIS — I129 Hypertensive chronic kidney disease with stage 1 through stage 4 chronic kidney disease, or unspecified chronic kidney disease: Secondary | ICD-10-CM | POA: Insufficient documentation

## 2011-05-02 DIAGNOSIS — Z79899 Other long term (current) drug therapy: Secondary | ICD-10-CM | POA: Insufficient documentation

## 2011-05-02 DIAGNOSIS — N189 Chronic kidney disease, unspecified: Secondary | ICD-10-CM | POA: Insufficient documentation

## 2011-05-02 LAB — PROTIME-INR: INR: 1.17 (ref 0.00–1.49)

## 2011-05-02 LAB — DIFFERENTIAL
Basophils Absolute: 0 10*3/uL (ref 0.0–0.1)
Basophils Relative: 0 % (ref 0–1)
Eosinophils Relative: 0 % (ref 0–5)
Monocytes Absolute: 1.1 10*3/uL — ABNORMAL HIGH (ref 0.1–1.0)
Neutro Abs: 9.8 10*3/uL — ABNORMAL HIGH (ref 1.7–7.7)

## 2011-05-02 LAB — CBC
MCHC: 35.1 g/dL (ref 30.0–36.0)
Platelets: 447 10*3/uL — ABNORMAL HIGH (ref 150–400)
RDW: 13.4 % (ref 11.5–15.5)
WBC: 13.1 10*3/uL — ABNORMAL HIGH (ref 4.0–10.5)

## 2011-05-02 LAB — POCT I-STAT, CHEM 8
Creatinine, Ser: 9.7 mg/dL — ABNORMAL HIGH (ref 0.50–1.35)
Glucose, Bld: 109 mg/dL — ABNORMAL HIGH (ref 70–99)
HCT: 36 % — ABNORMAL LOW (ref 39.0–52.0)
Hemoglobin: 12.2 g/dL — ABNORMAL LOW (ref 13.0–17.0)
Sodium: 126 mEq/L — ABNORMAL LOW (ref 135–145)
TCO2: 13 mmol/L (ref 0–100)

## 2011-05-02 LAB — URINE MICROSCOPIC-ADD ON

## 2011-05-02 LAB — HEPATIC FUNCTION PANEL
ALT: 14 U/L (ref 0–53)
AST: 18 U/L (ref 0–37)
Indirect Bilirubin: 0.2 mg/dL — ABNORMAL LOW (ref 0.3–0.9)
Total Protein: 6.3 g/dL (ref 6.0–8.3)

## 2011-05-02 LAB — CK TOTAL AND CKMB (NOT AT ARMC)
CK, MB: 5.9 ng/mL — ABNORMAL HIGH (ref 0.3–4.0)
Relative Index: INVALID (ref 0.0–2.5)

## 2011-05-02 LAB — RENAL FUNCTION PANEL
Albumin: 2.5 g/dL — ABNORMAL LOW (ref 3.5–5.2)
BUN: 70 mg/dL — ABNORMAL HIGH (ref 6–23)
Chloride: 93 mEq/L — ABNORMAL LOW (ref 96–112)
Creatinine, Ser: 8.53 mg/dL — ABNORMAL HIGH (ref 0.50–1.35)
Glucose, Bld: 90 mg/dL (ref 70–99)

## 2011-05-02 LAB — URINALYSIS, ROUTINE W REFLEX MICROSCOPIC
Glucose, UA: NEGATIVE mg/dL
Ketones, ur: NEGATIVE mg/dL
pH: 5 (ref 5.0–8.0)

## 2011-05-03 ENCOUNTER — Ambulatory Visit (HOSPITAL_COMMUNITY): Payer: No Typology Code available for payment source

## 2011-05-03 ENCOUNTER — Inpatient Hospital Stay (HOSPITAL_COMMUNITY): Payer: No Typology Code available for payment source

## 2011-05-03 DIAGNOSIS — K8309 Other cholangitis: Secondary | ICD-10-CM

## 2011-05-03 DIAGNOSIS — N19 Unspecified kidney failure: Secondary | ICD-10-CM

## 2011-05-03 LAB — SODIUM, URINE, RANDOM: Sodium, Ur: 102 mEq/L

## 2011-05-03 LAB — URINE CULTURE

## 2011-05-03 LAB — CBC
Hemoglobin: 10.9 g/dL — ABNORMAL LOW (ref 13.0–17.0)
MCH: 29.9 pg (ref 26.0–34.0)
RBC: 3.65 MIL/uL — ABNORMAL LOW (ref 4.22–5.81)
WBC: 10.6 10*3/uL — ABNORMAL HIGH (ref 4.0–10.5)

## 2011-05-03 LAB — GLUCOSE, CAPILLARY
Glucose-Capillary: 80 mg/dL (ref 70–99)
Glucose-Capillary: 86 mg/dL (ref 70–99)
Glucose-Capillary: 105 mg/dL — ABNORMAL HIGH (ref 70–99)
Glucose-Capillary: 93 mg/dL (ref 70–99)

## 2011-05-03 LAB — CREATININE, URINE, RANDOM: Creatinine, Urine: 50.9 mg/dL

## 2011-05-03 LAB — RENAL FUNCTION PANEL
Albumin: 2.5 g/dL — ABNORMAL LOW (ref 3.5–5.2)
CO2: 14 mEq/L — ABNORMAL LOW (ref 19–32)
Chloride: 94 mEq/L — ABNORMAL LOW (ref 96–112)
GFR calc Af Amer: 7 mL/min — ABNORMAL LOW (ref >60)
GFR calc non Af Amer: 6 mL/min — ABNORMAL LOW (ref >60)
Potassium: 5.1 mEq/L (ref 3.5–5.1)
Sodium: 128 mEq/L — ABNORMAL LOW (ref 135–145)

## 2011-05-03 LAB — CK: Total CK: 63 U/L (ref 7–232)

## 2011-05-03 NOTE — H&P (Signed)
NAME:  [name], [name] NO.:  [id number]  MEDICAL RECORD NO.:  [id number]  LOCATION:  WLED                         [facility]ACILITY:  [facility]  PHYSICIAN:  Brendia Sacks, MD    DATE OF BIRTH:  1926/11/06  DATE OF ADMISSION:  05/02/2011 DATE OF DISCHARGE:                             HISTORY & PHYSICAL   PRIMARY CARE PHYSICIAN:  Marjory Lies, M.D.  PRIMARY INFECTIOUS DISEASE PHYSICIAN:  Gardiner Barefoot, MD.  PRIMARY GASTROENTEROLOGIST:  Jordan Hawks. Elnoria Howard, MD.  PRIMARY INTERVENTIONAL RADIOLOGIST:  Art A. Hoss, M.D.  REFERRING PHYSICIAN:  Lorre Nick, M.D. in the emergency room.  CHIEF COMPLAINT:  No urine output.  HISTORY OF PRESENT ILLNESS:  This is an 77 year old man who presents to the emergency room with minimal urine output.  History is obtained from his wife as the patient does have some dementia.  The patient was recently hospitalized from July 26th to August 5th for diagnoses including acute cholangitis and biliary pancreatitis.  At that time, he was discharged home on ciprofloxacin as well as daptomycin and continued on benazepril and metformin.  Since the patient has been home, he has had very poor appetite and has generally become weaker.  His wife reports that she has been pushing fluids and has got a fair amount of fluids in successfully; however, the patient has made almost no urine in the last 3 days that he has been home, so she brought him back to the emergency room for evaluation today.  In the emergency room, he was found to have a creatinine of 9.7.  Foley catheter was placed with return of less than 50 cc of urine.  I was consulted for admission at [facility]  The patient does have some dementia at baseline.  History is obtained from his wife who is at the bedside and daughter.  REVIEW OF SYSTEMS:  Negative for fever, changes to his vision, sore throat, rash, muscle aches, chest pain, shortness of breath.  He did vomit twice today.  No  dysuria, no bleeding.  No abdominal pain or diarrhea.  His drain has been functioning well his wife reports.  PAST MEDICAL HISTORY: 1. Hospitalization July to early August of this year for acute     cholangitis and biliary pancreatitis. 2. Dementia. 3. MI in April 2012 with bare-metal stent placement. 4. Ischemic cardiomyopathy with ejection fraction of 45%. 5. History of high-grade AV block status post pacemaker placement. 6. Hypertension. 7. Diabetes. 8. Hyperlipidemia. 9. TIA. 10.COPD is listed in the chart, although his wife denies this.  PAST SURGICAL HISTORY: 1. Cholecystectomy. 2. Pacemaker placement. 3. Pancreatic stent placement in [place] years ago. 4. Biliary drain placement. 5. Ankle surgery.  SOCIAL HISTORY:  Nonsmoker, nondrinker.  He lives in [place].  ALLERGIES:  PENICILLIN, which causes rash and DOXYCYCLINE, reaction unknown.  FAMILY HISTORY:  No heart disease in first-degree relatives.  CURRENT MEDICATIONS: 1. Daptomycin 350 mg IV daily. 2. Cipro 500 mg p.o. b.i.d. 3. Benazepril 20 mg p.o. daily. 4. Zocor 40 mg p.o. Monday, Wednesday, and Friday. 5. Zetia 10 mg p.o. daily. 6. Toviaz 4 mg p.o. daily. 7. Plavix 75 mg p.o. daily. 8. Omeprazole 20 mg p.o.  b.i.d. 9. Nitroglycerin sublingual 0.4 mg every 5 minutes as needed for chest     pain up to three doses. 10.Multivitamins p.o. daily. 11.Metformin 500 mg p.o. b.i.d. 12.Magnesium sulfate 500 mg p.o. daily. 13.Diflucan 200 mg p.o. daily. 14.Coreg 12.5 mg p.o. b.i.d. 15.Aspirin 325 mg one-half tablet p.o. daily. 16.Aricept 5 mg p.o. q.h.s.  PHYSICAL EXAMINATION:  VITAL SIGNS:  The patient was examined in the emergency room.  His blood pressure was 99/64, his pulse was 73, his respirations were 18, temperature 97.8, sat 96%. GENERAL:  Well-developed, well-nourished man lying on a stretcher.  He appears to be in no acute distress.  He is hard of hearing.  He does hear family well with raised volume  of voice. HEENT:  Head appears to be normal.  Eyes: Sclerae clear.  Pupils equal, round, reactive to light.  Lids, irises, and conjunctivae appear unremarkable.  ENT: Hearing is grossly normal.  Lips and tongue appear unremarkable. NECK: Supple.  No lymphadenopathy or masses.  No thyromegaly. CHEST: Clear to auscultation bilaterally.  No wheezes, rales, or rhonchi.  There is normal respiratory effort.  No lower extremity edema. CARDIOVASCULAR:  Regular rate and rhythm.  No murmur, rub, or gallop. ABDOMEN: Soft, nontender, nondistended.  No masses are appreciated. SKIN:  Normal without rash or indurations.  Nontender to palpation.  IMAGING:  None.  LABORATORY STUDIES: 1. CBC notable for a hemoglobin of 12.2, which appears to be stable.     Platelet count of 447. 2. Basic metabolic panel is notable for a creatinine of 9.7 with a BUN     of 64.  Creatinine 3 days ago was 1.06.  Sodium is 126, potassium     is 5.0.  Alkaline phosphatase mildly elevated at 147. 3. Serum lipase within normal limits. 4. Total CK is within normal limits. 5. Troponin is negative. 6. CK-MB is mildly elevated, the significance of this is unclear as     the patient has been completely asymptomatic. 7. Urinalysis was noted to be turbid with some protein in it, but with     nitrite and leukocyte esterase negative.  ASSESSMENT AND PLAN:  This 77 year old man presents with acute renal failure and oliguria. 1. Acute renal failure.  Etiology unclear, but would favor adverse     drug reaction, possibly from Daptomycin or ciprofloxacin.  Although     his BUN is elevated, the relationship of this to his creatinine     elevation, I think makes a simple prerenal state or postrenal     state unlikely.  Although the patient has had decreased intake,     his wife has been diligent about getting fluids into him.  We will     obtain a renal ultrasound to rule out hydronephrosis.  Foley     catheter is in place.  We will  place him on IV fluids.  Check     laboratory studies in the morning.  I have discussed the case with     Dr. Arrie Aran of Nephrology, who will be seeing the patient in     consultation to provide further recommendations.  Given the     patient's rapid rise in his creatinine, I am concerned that he may     need more aggressive therapy, if he is indeed a candidate for that     and therefore I have recommended transfer to [facility] with which     the family was agreeable. 2. Oliguria.  Plan as above. 3.  Acute cholangitis and biliary pancreatitis.  Wife reports actually     he is feeling quite well from this standpoint.  He has had no     abdominal symptoms and/or pain.  He was discharged on ciprofloxacin     and daptomycin and culture results were notable for negative blood     cultures, but positive biliary culture with enterococcus which was     vancomycin resistant and pseudomonas.  This patient has received     antibiotics today.  We will hold on his antibiotics for now.  Would     consider re-consulting Infectious Disease in the morning for     assistance with     further antimicrobial therapy in light of the patient's renal     failure.  He has received adequate coverage today. 4. History of diabetes.  Hold his metformin.  Place him on sliding     scale insulin.  The remainder of his comorbidities appear to be     stable at this time.     Brendia Sacks, MD     DG/MEDQ  D:  05/02/2011  T:  05/02/2011  Job:  [id number]  cc:   Marjory Lies, M.D. Fax: [phone]  Jordan Hawks. Elnoria Howard, MD Fax: [phone]  Gardiner Barefoot, MD  Art A. Hoss, M.D. [address] [place], [place] [address]  Electronically Signed by Brendia Sacks  on 05/03/2011 05:58:55 PM

## 2011-05-04 LAB — GLUCOSE, CAPILLARY
Glucose-Capillary: 106 mg/dL — ABNORMAL HIGH (ref 70–99)
Glucose-Capillary: 110 mg/dL — ABNORMAL HIGH (ref 70–99)
Glucose-Capillary: 196 mg/dL — ABNORMAL HIGH (ref 70–99)
Glucose-Capillary: 198 mg/dL — ABNORMAL HIGH (ref 70–99)

## 2011-05-04 LAB — PHOSPHORUS: Phosphorus: 7.6 mg/dL — ABNORMAL HIGH (ref 2.3–4.6)

## 2011-05-04 LAB — CBC
HCT: 26.9 % — ABNORMAL LOW (ref 39.0–52.0)
Hemoglobin: 9.6 g/dL — ABNORMAL LOW (ref 13.0–17.0)
WBC: 7.4 10*3/uL (ref 4.0–10.5)

## 2011-05-04 LAB — BASIC METABOLIC PANEL
Chloride: 95 mEq/L — ABNORMAL LOW (ref 96–112)
Creatinine, Ser: 10.03 mg/dL — ABNORMAL HIGH (ref 0.50–1.35)
GFR calc Af Amer: 6 mL/min — ABNORMAL LOW (ref >60)
GFR calc non Af Amer: 5 mL/min — ABNORMAL LOW (ref >60)
Potassium: 4.1 mEq/L (ref 3.5–5.1)

## 2011-05-04 LAB — PROTIME-INR
INR: 1.2 (ref 0.00–1.49)
Prothrombin Time: 15.5 seconds — ABNORMAL HIGH (ref 11.6–15.2)

## 2011-05-04 LAB — URINE CULTURE

## 2011-05-04 LAB — APTT: aPTT: 28 seconds (ref 24–37)

## 2011-05-05 ENCOUNTER — Inpatient Hospital Stay (HOSPITAL_COMMUNITY): Payer: No Typology Code available for payment source

## 2011-05-05 LAB — BASIC METABOLIC PANEL
BUN: 84 mg/dL — ABNORMAL HIGH (ref 6–23)
Chloride: 78 mEq/L — ABNORMAL LOW (ref 96–112)
GFR calc Af Amer: 6 mL/min — ABNORMAL LOW (ref >60)
GFR calc non Af Amer: 5 mL/min — ABNORMAL LOW (ref >60)
Potassium: 3.7 mEq/L (ref 3.5–5.1)

## 2011-05-05 LAB — CBC
HCT: 24.6 % — ABNORMAL LOW (ref 39.0–52.0)
MCHC: 37 g/dL — ABNORMAL HIGH (ref 30.0–36.0)
Platelets: 309 10*3/uL (ref 150–400)
RDW: 13.1 % (ref 11.5–15.5)
WBC: 5 10*3/uL (ref 4.0–10.5)

## 2011-05-05 LAB — GLUCOSE, CAPILLARY
Glucose-Capillary: 184 mg/dL — ABNORMAL HIGH (ref 70–99)
Glucose-Capillary: 167 mg/dL — ABNORMAL HIGH (ref 70–99)

## 2011-05-05 NOTE — Consult Note (Signed)
NAME:  [name], [name]               ACCOUNT NO.:  [id number]  MEDICAL RECORD NO.:  [id number]  LOCATION:                                 FACILITY:  PHYSICIAN:  Gardiner Barefoot, MD    DATE OF BIRTH:  1926-10-24  DATE OF CONSULTATION: DATE OF DISCHARGE:                                CONSULTATION   REASON FOR CONSULTATION:  Antibiotic management.  HISTORY OF PRESENT ILLNESS:  This is an 77 year old male with a past medical history significant for gallstone pancreatitis, previously managed as an outpatient and had been scheduled for a biliary stent placement prior to admission, however, developed significant pain as an outpatient and presented to the emergency room.  There he was noted that he had acute pancreatitis and it was decided to admit him for further management.  He did have a scheduled biliary stent placement with a biliary drain placed on July 30th.  At that time, cultures were sent and he has had measurable drainage coming out since then.  His output has been in fact about between 800 and 1500 cc per 24 hours and the cultures did grow out enterococcus as well as yeast, which was consistent with candida.  The patient himself reports that he is better since the interventions have been done.  It was notable that his white count was significantly elevated into 30s at presentation as well as he was febrile.  Since intervention, his white count has normalized and he has not had a recurrence of fevers.  The plan is that he will have a follow up intervention as previously scheduled once this infection clears. Otherwise, the patient has no significant complaints.  PAST MEDICAL HISTORY: 1. MI with history of stent in April 2012. 2. Ischemic cardiomyopathy with an EF of 45%. 3. History of gallstone pancreatitis. 4. History of secondary AV block with a history of St. Jude pacemaker. 5. Hypertension. 6. Diabetes. 7. Hyperlipidemia. 8. History of obesity. 9. History of  cholecystectomy. 10.History of TIAs. 11.GERD.  MEDICATIONS:  In the hospital include, 1. Primaxin. 2. Carvedilol 12.5 mg twice a day. 3. Plavix 75 mg daily. 4. Aricept 5 mg at night. 5. Protonix 40 mg daily.  ALLERGIES: 1. PENICILLINS, which causes rash. 2. DOXYCYCLINE, which has unknown reaction. 3. STATINS of an unknown reaction.  SOCIAL HISTORY:  He is married and his wife is in the room with him now. He denies any alcohol, tobacco, or drug use.  FAMILY HISTORY:  No history of abdominal cancers.  REVIEW OF SYSTEMS:  A 12-point review of systems is negative except as per the history present illness.  PHYSICAL EXAMINATION:  VITAL SIGNS:  Temperature is 98.4, pulse 71, respirations 18, blood pressure is 117/74, and O2 sat 97% on 2 L nasal cannula. GENERAL:  The patient is awake, alert, and oriented x3 and appears in no acute distress. CARDIOVASCULAR:  Regular rate and rhythm with no murmurs, rubs, or gallops. LUNGS:  Clear to auscultation bilaterally. ABDOMEN:  Soft, nontender, nondistended, and positive bowel sounds. Drain is in place and it is draining bilious-colored fluid. EXTREMITIES:  No cyanosis, clubbing, edema.  LABORATORY DATA:  WBC is 7.7 with 74% neutrophils, down from  previous WBC of 34.  AST is 11, ALT is 23, both have improved as well as a total bilirubin of 0.4.  Culture of the biliary fluid does show enterococcus species, some gram-negative rods, as well as yeast with candida.  ASSESSMENT AND PLAN:  Cholangitis.  The patient did not have much in the way of jaundice, he certainly did have the abdominal pain and had common bile duct stones and fevers as well as high white count, now we would treat this as an infection of course.  He does have organisms growing in the fluid and I would treat fairly broadly with this.  I would treat him with vancomycin to cover the enterococcus, ampicillin unfortunately will not be a choice with his PENICILLIN allergy.  I  do not feel that the addition of gentamicin is indicated at this time without any actual bacteremia.  I also would treat the gram-negative rods with ciprofloxacin, though we will wait for sensitivities to see if that is covered by Cipro.  I also would treat the yeast with p.o. Diflucan at 200 mg a day.  I will continue treatment for about 10 to 14 days due to the continued drainage.  Thank you for the consult.     Gardiner Barefoot, MD     RWC/MEDQ  D:  04/26/2011  T:  04/27/2011  Job:  [id number]  Electronically Signed by Staci Righter MD on 05/05/2011 08:28:52 PM

## 2011-05-06 LAB — CBC
Hemoglobin: 9.6 g/dL — ABNORMAL LOW (ref 13.0–17.0)
MCH: 30.7 pg (ref 26.0–34.0)
MCHC: 35 g/dL (ref 30.0–36.0)
MCV: 87.5 fL (ref 78.0–100.0)
Platelets: 329 10*3/uL (ref 150–400)
RBC: 3.13 MIL/uL — ABNORMAL LOW (ref 4.22–5.81)

## 2011-05-06 LAB — BASIC METABOLIC PANEL
CO2: 42 mEq/L (ref 19–32)
Calcium: 7.6 mg/dL — ABNORMAL LOW (ref 8.4–10.5)
Creatinine, Ser: 9.17 mg/dL — ABNORMAL HIGH (ref 0.50–1.35)
GFR calc non Af Amer: 6 mL/min — ABNORMAL LOW (ref >60)
Glucose, Bld: 112 mg/dL — ABNORMAL HIGH (ref 70–99)

## 2011-05-07 DIAGNOSIS — M7989 Other specified soft tissue disorders: Secondary | ICD-10-CM

## 2011-05-07 LAB — BASIC METABOLIC PANEL
BUN: 71 mg/dL — ABNORMAL HIGH (ref 6–23)
CO2: 39 mEq/L — ABNORMAL HIGH (ref 19–32)
Chloride: 83 mEq/L — ABNORMAL LOW (ref 96–112)
Creatinine, Ser: 8.61 mg/dL — ABNORMAL HIGH (ref 0.50–1.35)
Glucose, Bld: 102 mg/dL — ABNORMAL HIGH (ref 70–99)

## 2011-05-07 LAB — CBC
HCT: 28.4 % — ABNORMAL LOW (ref 39.0–52.0)
MCV: 89 fL (ref 78.0–100.0)
RBC: 3.19 MIL/uL — ABNORMAL LOW (ref 4.22–5.81)
WBC: 7.9 10*3/uL (ref 4.0–10.5)

## 2011-05-07 LAB — GLUCOSE, CAPILLARY
Glucose-Capillary: 104 mg/dL — ABNORMAL HIGH (ref 70–99)
Glucose-Capillary: 116 mg/dL — ABNORMAL HIGH (ref 70–99)

## 2011-05-08 ENCOUNTER — Inpatient Hospital Stay (HOSPITAL_COMMUNITY): Payer: No Typology Code available for payment source

## 2011-05-08 LAB — HEPATIC FUNCTION PANEL
Albumin: 2 g/dL — ABNORMAL LOW (ref 3.5–5.2)
Alkaline Phosphatase: 183 U/L — ABNORMAL HIGH (ref 39–117)
Indirect Bilirubin: 0.2 mg/dL — ABNORMAL LOW (ref 0.3–0.9)
Total Protein: 5.8 g/dL — ABNORMAL LOW (ref 6.0–8.3)

## 2011-05-08 LAB — RENAL FUNCTION PANEL
Albumin: 1.8 g/dL — ABNORMAL LOW (ref 3.5–5.2)
BUN: 66 mg/dL — ABNORMAL HIGH (ref 6–23)
Calcium: 8.6 mg/dL (ref 8.4–10.5)
Chloride: 88 mEq/L — ABNORMAL LOW (ref 96–112)
Creatinine, Ser: 7.72 mg/dL — ABNORMAL HIGH (ref 0.50–1.35)
GFR calc non Af Amer: 7 mL/min — ABNORMAL LOW (ref >60)
Phosphorus: 6.6 mg/dL — ABNORMAL HIGH (ref 2.3–4.6)

## 2011-05-08 LAB — CBC
HCT: 30.1 % — ABNORMAL LOW (ref 39.0–52.0)
MCHC: 33.6 g/dL (ref 30.0–36.0)
MCV: 89.6 fL (ref 78.0–100.0)
Platelets: 373 10*3/uL (ref 150–400)
RDW: 13.1 % (ref 11.5–15.5)
WBC: 10.3 10*3/uL (ref 4.0–10.5)

## 2011-05-08 LAB — BLOOD GAS, ARTERIAL
Bicarbonate: 35 mEq/L — ABNORMAL HIGH (ref 20.0–24.0)
O2 Saturation: 89.3 %
Patient temperature: 98.6
pH, Arterial: 7.493 — ABNORMAL HIGH (ref 7.350–7.450)

## 2011-05-08 LAB — CARDIAC PANEL(CRET KIN+CKTOT+MB+TROPI)
CK, MB: 1.7 ng/mL (ref 0.3–4.0)
Relative Index: INVALID (ref 0.0–2.5)
Total CK: 22 U/L (ref 7–232)
Troponin I: <0.3 ng/mL (ref <0.30)

## 2011-05-08 LAB — GLUCOSE, CAPILLARY
Glucose-Capillary: 187 mg/dL — ABNORMAL HIGH (ref 70–99)
Glucose-Capillary: 88 mg/dL (ref 70–99)

## 2011-05-08 LAB — AMMONIA: Ammonia: 15 umol/L (ref 11–60)

## 2011-05-09 DIAGNOSIS — G510 Bell's palsy: Secondary | ICD-10-CM

## 2011-05-09 DIAGNOSIS — R4182 Altered mental status, unspecified: Secondary | ICD-10-CM

## 2011-05-09 DIAGNOSIS — F29 Unspecified psychosis not due to a substance or known physiological condition: Secondary | ICD-10-CM

## 2011-05-09 DIAGNOSIS — I359 Nonrheumatic aortic valve disorder, unspecified: Secondary | ICD-10-CM

## 2011-05-09 LAB — BLOOD GAS, ARTERIAL
Drawn by: 24486
FIO2: 0.32 %
pCO2 arterial: 45.5 mmHg — ABNORMAL HIGH (ref 35.0–45.0)
pH, Arterial: 7.483 — ABNORMAL HIGH (ref 7.350–7.450)

## 2011-05-09 LAB — CBC
HCT: 31.9 % — ABNORMAL LOW (ref 39.0–52.0)
Platelets: 368 10*3/uL (ref 150–400)
RBC: 3.52 MIL/uL — ABNORMAL LOW (ref 4.22–5.81)
RDW: 13.4 % (ref 11.5–15.5)
WBC: 11.1 10*3/uL — ABNORMAL HIGH (ref 4.0–10.5)

## 2011-05-09 LAB — GLUCOSE, CAPILLARY
Glucose-Capillary: 97 mg/dL (ref 70–99)
Glucose-Capillary: 105 mg/dL — ABNORMAL HIGH (ref 70–99)
Glucose-Capillary: 95 mg/dL (ref 70–99)
Glucose-Capillary: 163 mg/dL — ABNORMAL HIGH (ref 70–99)

## 2011-05-09 LAB — COMPREHENSIVE METABOLIC PANEL
ALT: 15 U/L (ref 0–53)
AST: 23 U/L (ref 0–37)
Alkaline Phosphatase: 171 U/L — ABNORMAL HIGH (ref 39–117)
Calcium: 9 mg/dL (ref 8.4–10.5)
Potassium: 3.5 mEq/L (ref 3.5–5.1)
Sodium: 136 mEq/L (ref 135–145)
Total Protein: 5.8 g/dL — ABNORMAL LOW (ref 6.0–8.3)

## 2011-05-09 LAB — MRSA PCR SCREENING: MRSA by PCR: NEGATIVE

## 2011-05-09 LAB — PROTIME-INR: INR: 1.07 (ref 0.00–1.49)

## 2011-05-09 LAB — CARDIAC PANEL(CRET KIN+CKTOT+MB+TROPI)
CK, MB: 1.6 ng/mL (ref 0.3–4.0)
Total CK: 17 U/L (ref 7–232)
Troponin I: <0.3 ng/mL (ref <0.30)

## 2011-05-09 LAB — PROCALCITONIN: Procalcitonin: 0.94 ng/mL

## 2011-05-09 LAB — LACTIC ACID, PLASMA: Lactic Acid, Venous: 0.9 mmol/L (ref 0.5–2.2)

## 2011-05-10 DIAGNOSIS — G459 Transient cerebral ischemic attack, unspecified: Secondary | ICD-10-CM

## 2011-05-10 DIAGNOSIS — K8309 Other cholangitis: Secondary | ICD-10-CM

## 2011-05-10 DIAGNOSIS — N19 Unspecified kidney failure: Secondary | ICD-10-CM

## 2011-05-10 LAB — DIFFERENTIAL
Basophils Absolute: 0 10*3/uL (ref 0.0–0.1)
Eosinophils Absolute: 0.1 10*3/uL (ref 0.0–0.7)
Eosinophils Relative: 1 % (ref 0–5)
Lymphocytes Relative: 15 % (ref 12–46)
Monocytes Absolute: 1.2 10*3/uL — ABNORMAL HIGH (ref 0.1–1.0)

## 2011-05-10 LAB — GLUCOSE, CAPILLARY
Glucose-Capillary: 112 mg/dL — ABNORMAL HIGH (ref 70–99)
Glucose-Capillary: 157 mg/dL — ABNORMAL HIGH (ref 70–99)
Glucose-Capillary: 87 mg/dL (ref 70–99)

## 2011-05-10 LAB — COMPREHENSIVE METABOLIC PANEL
AST: 21 U/L (ref 0–37)
Albumin: 2.2 g/dL — ABNORMAL LOW (ref 3.5–5.2)
BUN: 69 mg/dL — ABNORMAL HIGH (ref 6–23)
Creatinine, Ser: 6.2 mg/dL — ABNORMAL HIGH (ref 0.50–1.35)
Potassium: 3.3 mEq/L — ABNORMAL LOW (ref 3.5–5.1)
Total Protein: 6.1 g/dL (ref 6.0–8.3)

## 2011-05-10 LAB — CBC
HCT: 32.4 % — ABNORMAL LOW (ref 39.0–52.0)
MCHC: 34.3 g/dL (ref 30.0–36.0)
Platelets: 383 10*3/uL (ref 150–400)
RDW: 13.3 % (ref 11.5–15.5)

## 2011-05-11 ENCOUNTER — Inpatient Hospital Stay (HOSPITAL_COMMUNITY): Payer: No Typology Code available for payment source

## 2011-05-11 DIAGNOSIS — F29 Unspecified psychosis not due to a substance or known physiological condition: Secondary | ICD-10-CM

## 2011-05-11 DIAGNOSIS — G510 Bell's palsy: Secondary | ICD-10-CM

## 2011-05-11 LAB — BASIC METABOLIC PANEL
BUN: 68 mg/dL — ABNORMAL HIGH (ref 6–23)
BUN: 71 mg/dL — ABNORMAL HIGH (ref 6–23)
CO2: 34 mEq/L — ABNORMAL HIGH (ref 19–32)
Chloride: 89 mEq/L — ABNORMAL LOW (ref 96–112)
Creatinine, Ser: 6.47 mg/dL — ABNORMAL HIGH (ref 0.50–1.35)
GFR calc non Af Amer: 10 mL/min — ABNORMAL LOW (ref >60)
Glucose, Bld: 194 mg/dL — ABNORMAL HIGH (ref 70–99)
Glucose, Bld: 131 mg/dL — ABNORMAL HIGH (ref 70–99)
Potassium: 2.9 mEq/L — ABNORMAL LOW (ref 3.5–5.1)

## 2011-05-11 LAB — GLUCOSE, CAPILLARY
Glucose-Capillary: 138 mg/dL — ABNORMAL HIGH (ref 70–99)
Glucose-Capillary: 215 mg/dL — ABNORMAL HIGH (ref 70–99)
Glucose-Capillary: 249 mg/dL — ABNORMAL HIGH (ref 70–99)
Glucose-Capillary: 133 mg/dL — ABNORMAL HIGH (ref 70–99)

## 2011-05-11 LAB — CBC
HCT: 33.4 % — ABNORMAL LOW (ref 39.0–52.0)
Hemoglobin: 11.2 g/dL — ABNORMAL LOW (ref 13.0–17.0)
MCH: 30.1 pg (ref 26.0–34.0)
MCHC: 33.5 g/dL (ref 30.0–36.0)

## 2011-05-11 LAB — DIFFERENTIAL
Basophils Absolute: 0 10*3/uL (ref 0.0–0.1)
Lymphocytes Relative: 19 % (ref 12–46)
Lymphs Abs: 1.9 10*3/uL (ref 0.7–4.0)
Monocytes Absolute: 1 10*3/uL (ref 0.1–1.0)
Monocytes Relative: 11 % (ref 3–12)
Neutro Abs: 6.6 10*3/uL (ref 1.7–7.7)

## 2011-05-11 NOTE — Consult Note (Signed)
NAMEMarland Rich  [name], HOOS NO.:  [id number]  MEDICAL RECORD NO.:  [id number]  LOCATION:  3306                         FACILITY:  [facility]  PHYSICIAN:  Coralyn Helling, MD        DATE OF BIRTH:  [birth year]  DATE OF CONSULTATION:  05/09/2011 DATE OF DISCHARGE:                                CONSULTATION   REFERRING PHYSICIAN:  Ramiro Harvest, MD.  REASON FOR CONSULTATION:  Altered mental status.  HISTORY OF PRESENT ILLNESS:  Mr. [name] is an 77 year old male who had gallstone pancreatitis with concerns for cholangitis. He was recently hospitalized and evaluated by Gastroenterology and Infectious Disease. He had a biliary drain placed by Interventional Radiology and cultures grew VRE Pseudomonas and Candida.  He was discharged home on August 5 with Cipro and Epogen.  He had poor oral intake and continued to take his ACE inhibitor and metformin.  He was subsequently readmitted on August 8 with acute renal failure with a creatinine up to 9.7.  His metformin and ACE inhibitor were stopped and he was provided intravenous fluids.  He completed his course of antibiotics.  His renal function slowly improved.  However, on August 14, he was noted to have progressive lethargy and right-sided facial droop with left-sided neglect.  He had CT scan of his head, which was negative.  He was unable to do MRI due to pacemaker.  Critical Care Service was consulted to further assess the cause of his lethargy.  PAST MEDICAL HISTORY:  Significant for; 1. Biliary pancreatitis with cholangitis. 2. Dementia. 3. Ischemic cardiomyopathy. 4. High-grade AV block status post pacemaker insertion. 5. Hypertension. 6. Transient ischemic attack.  PAST SURGICAL HISTORY:  Significant for pancreatic stent, biliary drain, and ankle surgery.  SOCIAL HISTORY:  There is no recent history of tobacco or alcohol use.  FAMILY HISTORY:  Noncontributory.  ALLERGIES:  PENICILLIN, DOXYCYCLINE, and  STATINS.  REVIEW OF SYSTEMS:  He denies chest pains, palpitations, dyspnea, abdominal pain, nausea, vomiting.  CURRENT MEDICATIONS:  Aspirin, Coreg, Fortaz, furosemide, insulin, Zyvox, magnesium oxide, and multivitamin.  PHYSICAL EXAMINATION:  GENERAL:  He was seen in his hospital room.  He is awake, easily arousable, follows commands, does appear to have left- sided neglect. VITAL SIGNS:  Heart rate is 68, blood pressure is 127/80, respirations 16, oxygen saturation 90% on 4 liters. HEENT:  Pupils reactive.  He had slurred speech.  Dry oral mucosa.  He has a facial droop on the right. CHEST:  He has diminished breath sounds at the bases with basilar rales. There is no wheezing. CARDIAC:  S1-S2.  Regular rate and rhythm.  No murmur.  Pulses are palpable and symmetric. ABDOMEN:  Soft.  Positive bowel sounds.  No tenderness.  He has a biliary drain with approximately 40 mL of drainage over the last 24 hours. EXTREMITIES:  There is 2+ edema. NEUROLOGIC:  He does follow commands.  Moves all extremities.  Has a right facial dropp and does appear to have left-sided neglect.  His sodium is 136, potassium 3.5, chloride is 89, CO2 is 36, BUN is 65, creatinine 6.8, glucose is 95.  WBC is 11.1, hemoglobin 7.6, hematocrit is 31.9, platelet count 360.  Cardiac enzymes negative.  Ammonia level was normal.  Arterial blood gas showed a pH of 7.43, pCO2 of 46, pO2 of 54, ALP is 171, AST is 23, ALT is 15, protein is 5.8, albumin is 2, calcium is 9, phosphorus is 6.9.  Chest x-ray showed basilar atelectasis, right greater than left with bilateral pleural effusions, mild cardiomegaly.  CT scan of the head showed a atrophy with small vessel ischemic changes in his cranial atherosclerosis, but no acute abnormalities.  IMPRESSION: 1. Change in mental status, right facial droop, and left-sided     neglect.  The main concern at this time is that he had a     cerebrovascular accident.  His initial CT  scan of the head was     negative.  Unfortunately, he is not able to have an MRI due to     pacemaker presence.  Neurology has been consulted by the Primary     Service.  I do not see any evidence to suggest that he has     worsening of his prior biliary infection as cause of his change in     mental status.  He is being followed by the Infectious Disease     Service for antibiotic management. 2. Biliary pancreatitis with acute cholangitis status post biliary     drain.  He has been followed by Infectious Disease and Inverntional     Radiology, and would defer timing of change of his drain and     adjustment of his antibiotics to Infectious Disease and     Interventional Radiology. 3. Acute renal failure likely related to hypovolemia with continued     use of ACE inhibitor and metformin.  He does seem to have     improvement in his renal function.  Nephrology Service is following     him closely and will defer further management to them. 4. Hypoxemia with bilateral pleural effusions.  His pleural effusion     is more present on the right and likely sympathetic effusion     related to his acute abdominal process.  I do not think that he     would warrant thoracentesis at this time, but he should have close     monitoring of his oxygenation as well as follow up on his chest x-     ray.  I suspect that as his renal function improves, his pleural     effusion should improve as well. 5. Anemia of chronic disease and acute illness, defer further followup     to the primary team. 6. Diabetes mellitus.  He is to continue on sliding scale insulin per     the primary team. 7. Ischemic cardiomyopathy.  His most recent echocardiogram from April     2012 showed ejection fraction of 55%-60%. 8. High-grade AV block status post pacemaker insertion.  I would defer further management to the primary team, Neurology, Nephrology, and Infectious Disease, as well as Interventional Radiology. He does not  appear to have any acute requirements from the Critical Care Service.  Please call if further assistance is needed.    Coralyn Helling, MD    VS/MEDQ  D:  05/09/2011  T:  05/09/2011  Job:  [id number]  Electronically Signed by Coralyn Helling MD on 05/11/2011 05:37:46 PM

## 2011-05-12 LAB — CBC
HCT: 34.7 % — ABNORMAL LOW (ref 39.0–52.0)
MCH: 30.5 pg (ref 26.0–34.0)
MCV: 91.3 fL (ref 78.0–100.0)
Platelets: 376 10*3/uL (ref 150–400)
RBC: 3.8 MIL/uL — ABNORMAL LOW (ref 4.22–5.81)
RDW: 13.3 % (ref 11.5–15.5)

## 2011-05-12 LAB — GLUCOSE, CAPILLARY
Glucose-Capillary: 138 mg/dL — ABNORMAL HIGH (ref 70–99)
Glucose-Capillary: 119 mg/dL — ABNORMAL HIGH (ref 70–99)
Glucose-Capillary: 120 mg/dL — ABNORMAL HIGH (ref 70–99)
Glucose-Capillary: 120 mg/dL — ABNORMAL HIGH (ref 70–99)

## 2011-05-12 LAB — DIFFERENTIAL
Eosinophils Absolute: 0.1 10*3/uL (ref 0.0–0.7)
Eosinophils Relative: 1 % (ref 0–5)
Lymphocytes Relative: 21 % (ref 12–46)
Lymphs Abs: 2.1 10*3/uL (ref 0.7–4.0)
Monocytes Relative: 10 % (ref 3–12)

## 2011-05-12 LAB — BASIC METABOLIC PANEL
BUN: 71 mg/dL — ABNORMAL HIGH (ref 6–23)
CO2: 30 mEq/L (ref 19–32)
Calcium: 9.5 mg/dL (ref 8.4–10.5)
Chloride: 96 mEq/L (ref 96–112)
Creatinine, Ser: 6.55 mg/dL — ABNORMAL HIGH (ref 0.50–1.35)

## 2011-05-13 LAB — RENAL FUNCTION PANEL
Albumin: 2.1 g/dL — ABNORMAL LOW (ref 3.5–5.2)
CO2: 25 mEq/L (ref 19–32)
Chloride: 103 mEq/L (ref 96–112)
Creatinine, Ser: 4.69 mg/dL — ABNORMAL HIGH (ref 0.50–1.35)
GFR calc Af Amer: 15 mL/min — ABNORMAL LOW (ref >60)
GFR calc non Af Amer: 12 mL/min — ABNORMAL LOW (ref >60)
Potassium: 4.2 mEq/L (ref 3.5–5.1)
Sodium: 140 mEq/L (ref 135–145)

## 2011-05-13 LAB — CBC
Platelets: 325 10*3/uL (ref 150–400)
RBC: 3.54 MIL/uL — ABNORMAL LOW (ref 4.22–5.81)
RDW: 13.2 % (ref 11.5–15.5)
WBC: 8.4 10*3/uL (ref 4.0–10.5)

## 2011-05-13 LAB — DIFFERENTIAL
Basophils Absolute: 0 10*3/uL (ref 0.0–0.1)
Basophils Relative: 0 % (ref 0–1)
Eosinophils Absolute: 0.1 10*3/uL (ref 0.0–0.7)
Eosinophils Relative: 1 % (ref 0–5)
Lymphs Abs: 1.7 10*3/uL (ref 0.7–4.0)
Neutrophils Relative %: 71 % (ref 43–77)

## 2011-05-13 LAB — GLUCOSE, CAPILLARY
Glucose-Capillary: 114 mg/dL — ABNORMAL HIGH (ref 70–99)
Glucose-Capillary: 140 mg/dL — ABNORMAL HIGH (ref 70–99)
Glucose-Capillary: 87 mg/dL (ref 70–99)
Glucose-Capillary: 98 mg/dL (ref 70–99)
Glucose-Capillary: 99 mg/dL (ref 70–99)

## 2011-05-14 LAB — CBC
MCH: 30.5 pg (ref 26.0–34.0)
MCHC: 33.5 g/dL (ref 30.0–36.0)
MCV: 91.1 fL (ref 78.0–100.0)
Platelets: 275 10*3/uL (ref 150–400)
RBC: 3.05 MIL/uL — ABNORMAL LOW (ref 4.22–5.81)
RDW: 12.9 % (ref 11.5–15.5)

## 2011-05-14 LAB — RENAL FUNCTION PANEL
Albumin: 1.9 g/dL — ABNORMAL LOW (ref 3.5–5.2)
BUN: 47 mg/dL — ABNORMAL HIGH (ref 6–23)
Calcium: 8.7 mg/dL (ref 8.4–10.5)
Creatinine, Ser: 2.78 mg/dL — ABNORMAL HIGH (ref 0.50–1.35)
GFR calc non Af Amer: 22 mL/min — ABNORMAL LOW (ref >60)
Phosphorus: 2.7 mg/dL (ref 2.3–4.6)

## 2011-05-14 LAB — GLUCOSE, CAPILLARY
Glucose-Capillary: 106 mg/dL — ABNORMAL HIGH (ref 70–99)
Glucose-Capillary: 107 mg/dL — ABNORMAL HIGH (ref 70–99)
Glucose-Capillary: 156 mg/dL — ABNORMAL HIGH (ref 70–99)

## 2011-05-14 LAB — DIFFERENTIAL
Basophils Absolute: 0 10*3/uL (ref 0.0–0.1)
Basophils Relative: 0 % (ref 0–1)
Eosinophils Absolute: 0.1 10*3/uL (ref 0.0–0.7)
Monocytes Absolute: 0.4 10*3/uL (ref 0.1–1.0)
Neutro Abs: 5.8 10*3/uL (ref 1.7–7.7)

## 2011-05-14 NOTE — Consult Note (Signed)
NAME:  [name], [name] NO.:  [id number]  MEDICAL RECORD NO.:  [id number]  LOCATION:  5527                         FACILITY:  [facility]  PHYSICIAN:  Terrial Rhodes, M.D.DATE OF BIRTH:  Jan 15, 1927  DATE OF CONSULTATION:  05/02/2011 DATE OF DISCHARGE:                                CONSULTATION   REFERRING PHYSICIAN:  Brendia Sacks, MD  REASON FOR CONSULTATION:  Acute renal failure.  HISTORY OF PRESENT ILLNESS:  [name] is an 77 year old white male with past medical history significant for coronary artery disease, ischemic cardiomyopathy, hypertension, diabetes, AV block status post permanent pacemaker placement, and dementia who was recently admitted on April 19, 2011, for acute cholangitis and biliary pancreatitis.  He had a percutaneous biliary drain placed.  He was started on daptomycin and Cipro, was discharged on April 29, 2011.  He presented back to [facility] emergency department with a 3-day history of decreased urine output and weakness.  His creatinine at the time of discharge 1.06 on April 29, 2011.  While being evaluated in the emergency department here at [facility] his creatinine was significant for 9.7, potassium of 5.  We were asked to help further evaluate and manage his acute renal failure.  ALLERGIES:  PENICILLIN which causes a rash, STATINS and DOXYCYCLINE.  PAST MEDICAL HISTORY: 1. Biliary pancreatitis. 2. Cholangitis as above. 3. Ischemic cardiomyopathy with an EF of 45%. 4. Diabetes mellitus. 5. Hypertension. 6. History of second-degree AV block status post permanent pacemaker. 7. Hyperlipidemia. 8. Dementia. 9. History of TIAs. 10.Gastroesophageal reflux disease.  OUTPATIENT MEDICATIONS: 1. Benazepril 20 mg a day. 2. Cipro 500 mg b.i.d. 3. Daptomycin 350 mg IV daily. 4. Fluconazole 200 mg daily. 5. Aricept 5 mg at bedtime. 6. Aspirin 325 mg half a pill daily. 7. Coreg 12.5 mg b.i.d. 8. Magnesium sulfate 500 mg a  day. 9. Metformin 500 mg b.i.d. 10.Multivitamin one a day. 11.Sublingual nitroglycerin p.r.n. 12.Omeprazole 20 b.i.d. 13.Plavix 75 a day. 14.MiraLax p.r.n. 15.Toviaz 4 mg daily. 16.Zetia 10 mg daily.  FAMILY HISTORY:  Noncontributory.  SOCIAL HISTORY:  He is married, lives with his wife.  Nonsmoker, nondrinker, no IV drug use.  REVIEW OF SYSTEMS:  GENERAL:  The patient denied any anorexia or malaise, just weakness.  CARDIAC:  No chest pain, palpitations, orthopnea, PND.  PULMONARY:  No shortness of breath, hemoptysis, productive cough.  GI:  No nausea, vomiting, hematochezia, melena or bright red blood per rectum.  GU:  Decreased urine output.  No hematuria, pyuria, urgency or frequency.  NEUROLOGIC:  No arthralgias, myalgias.  DERMATOLOGIC:  No rashes, lumps or bumps.  No pruritus.  All other systems negative.  PHYSICAL EXAMINATION:  GENERAL:  This is an elderly male lying in bed in no apparent distress. VITAL SIGNS:  Temperature was 97.8, pulse 73, blood pressure 99/64, respiratory rate is 18, pulse ox was 96%. HEENT:  Head normocephalic, atraumatic.  Extraocular muscles intact.  No icterus.  Oropharynx showed dry mucous membranes. NECK:  Supple.  No lymphadenopathy or bruits. LUNGS:  Clear to auscultation and percussion bilaterally.  No rales or rhonchi. CARDIAC:  Regular rate and rhythm.  No precordial rub appreciated. ABDOMEN:  Normoactive bowel sounds,  soft, nontender, nondistended.  No guarding, no rebound, no bruits.  Biliary drain was intact in right upper quadrant.  EXTREMITIES:  No clubbing, cyanosis or edema.  LABORATORY DATA:  Hemoglobin 12.2, platelets 447, white blood cell count was 13.1.  He had 75% neutrophils, 17% lymphocytes, 0% eosinophils. Sodium 126, potassium 5, chloride 108, CO2 13, BUN 64, creatinine 9.7, glucose 109.  Urinalysis was negative for blood, trace protein, negative nitrites, negative leukocyte esterase.  CPK was 50.  INR is 1.17  and lipase 37.  His cultures from the wound on April 24, 2011, grew out Pseudomonas and Enterococcus that was VRE.  Ultrasound is pending.  ASSESSMENT AND PLAN: 1. Acute renal failure with oliguria, hyperkalemia, possibly     nephrotoxic related to daptomycin or ciprofloxacin.  Also on     differential would be acute tubular necrosis due to volume     depletion in the setting of gallstone pancreatitis or an ACE     inhibitor.  Does not appear to have an active urine sediment.     Agree with hydration, renal ultrasound to evaluate kidney size and     rule out obstruction and possible retroperitoneal lymphadenopathy     or obstruction above the bladder.  Agree with intravenous fluids as     the patient appears dry and we will continue to follow.  No     immediate indication for dialysis at this time. 2. Metabolic acidosis secondary to acute renal failure and concomitant     metformin therapy.  Would recommend change his intravenous fluids     to include bicarb and follow.  Continue to hold metformin. 3. Cholangitis with Pseudomonas and vancomycin-resistant enterococci.     Would recommend Infectious Disease consult for linezolid     intravenous therapy and other antibiotic coverage for the     Pseudomonas such as imipenem. 4. Hypertension, stable. 5. Diabetes mellitus per primary care physician. 6. Coronary artery disease, stable. 7. Dementia on Aricept.  We will continue to follow along.  Thank you for this consultation          ______________________________ Terrial Rhodes, M.D.     JC/MEDQ  D:  05/02/2011  T:  05/03/2011  Job:  [id number]  Electronically Signed by Terrial Rhodes M.D. on 05/14/2011 12:22:14 PM

## 2011-05-15 DIAGNOSIS — F0390 Unspecified dementia without behavioral disturbance: Secondary | ICD-10-CM

## 2011-05-15 DIAGNOSIS — K859 Acute pancreatitis without necrosis or infection, unspecified: Secondary | ICD-10-CM

## 2011-05-15 DIAGNOSIS — R5381 Other malaise: Secondary | ICD-10-CM

## 2011-05-15 LAB — FERRITIN: Ferritin: 242 ng/mL (ref 22–322)

## 2011-05-15 LAB — CULTURE, BLOOD (ROUTINE X 2): Culture  Setup Time: 201208151626

## 2011-05-15 LAB — IRON AND TIBC
Iron: 82 ug/dL (ref 42–135)
TIBC: 180 ug/dL — ABNORMAL LOW (ref 215–435)
UIBC: 98 ug/dL

## 2011-05-15 LAB — GLUCOSE, CAPILLARY
Glucose-Capillary: 117 mg/dL — ABNORMAL HIGH (ref 70–99)
Glucose-Capillary: 104 mg/dL — ABNORMAL HIGH (ref 70–99)
Glucose-Capillary: 135 mg/dL — ABNORMAL HIGH (ref 70–99)
Glucose-Capillary: 119 mg/dL — ABNORMAL HIGH (ref 70–99)

## 2011-05-15 LAB — BASIC METABOLIC PANEL
CO2: 25 mEq/L (ref 19–32)
Calcium: 8.6 mg/dL (ref 8.4–10.5)
Glucose, Bld: 110 mg/dL — ABNORMAL HIGH (ref 70–99)
Potassium: 3.6 mEq/L (ref 3.5–5.1)
Sodium: 138 mEq/L (ref 135–145)

## 2011-05-16 LAB — CBC
MCV: 89.6 fL (ref 78.0–100.0)
Platelets: 188 10*3/uL (ref 150–400)
Platelets: 197 10*3/uL (ref 150–400)
RBC: 2.36 MIL/uL — ABNORMAL LOW (ref 4.22–5.81)
RDW: 13 % (ref 11.5–15.5)
RDW: 12.9 % (ref 11.5–15.5)
WBC: 6.6 10*3/uL (ref 4.0–10.5)
WBC: 7.1 10*3/uL (ref 4.0–10.5)

## 2011-05-16 LAB — RENAL FUNCTION PANEL
Albumin: 1.9 g/dL — ABNORMAL LOW (ref 3.5–5.2)
CO2: 25 mEq/L (ref 19–32)
Chloride: 107 mEq/L (ref 96–112)
GFR calc Af Amer: 51 mL/min — ABNORMAL LOW (ref >60)
GFR calc non Af Amer: 42 mL/min — ABNORMAL LOW (ref >60)
Potassium: 3.5 mEq/L (ref 3.5–5.1)
Sodium: 138 mEq/L (ref 135–145)

## 2011-05-16 LAB — GLUCOSE, CAPILLARY
Glucose-Capillary: 102 mg/dL — ABNORMAL HIGH (ref 70–99)
Glucose-Capillary: 110 mg/dL — ABNORMAL HIGH (ref 70–99)
Glucose-Capillary: 112 mg/dL — ABNORMAL HIGH (ref 70–99)
Glucose-Capillary: 123 mg/dL — ABNORMAL HIGH (ref 70–99)
Glucose-Capillary: 131 mg/dL — ABNORMAL HIGH (ref 70–99)
Glucose-Capillary: 141 mg/dL — ABNORMAL HIGH (ref 70–99)

## 2011-05-16 LAB — DIFFERENTIAL
Basophils Absolute: 0 10*3/uL (ref 0.0–0.1)
Eosinophils Absolute: 0.1 10*3/uL (ref 0.0–0.7)
Eosinophils Relative: 2 % (ref 0–5)
Neutrophils Relative %: 68 % (ref 43–77)

## 2011-05-17 LAB — CBC
HCT: 26.6 % — ABNORMAL LOW (ref 39.0–52.0)
Hemoglobin: 7.7 g/dL — ABNORMAL LOW (ref 13.0–17.0)
Hemoglobin: 9.2 g/dL — ABNORMAL LOW (ref 13.0–17.0)
MCH: 30.4 pg (ref 26.0–34.0)
MCHC: 34.4 g/dL (ref 30.0–36.0)
MCHC: 34.6 g/dL (ref 30.0–36.0)
MCV: 86.6 fL (ref 78.0–100.0)
Platelets: 159 10*3/uL (ref 150–400)
RDW: 13.4 % (ref 11.5–15.5)
RDW: 13.6 % (ref 11.5–15.5)

## 2011-05-17 LAB — COMPREHENSIVE METABOLIC PANEL
ALT: 20 U/L (ref 0–53)
BUN: 32 mg/dL — ABNORMAL HIGH (ref 6–23)
CO2: 24 mEq/L (ref 19–32)
Calcium: 8.5 mg/dL (ref 8.4–10.5)
Creatinine, Ser: 1.38 mg/dL — ABNORMAL HIGH (ref 0.50–1.35)
GFR calc Af Amer: 60 mL/min — ABNORMAL LOW (ref >60)
GFR calc non Af Amer: 49 mL/min — ABNORMAL LOW (ref >60)
Glucose, Bld: 105 mg/dL — ABNORMAL HIGH (ref 70–99)
Total Protein: 4.6 g/dL — ABNORMAL LOW (ref 6.0–8.3)

## 2011-05-17 LAB — DIFFERENTIAL
Basophils Absolute: 0 10*3/uL (ref 0.0–0.1)
Basophils Relative: 0 % (ref 0–1)
Eosinophils Absolute: 0.1 10*3/uL (ref 0.0–0.7)
Eosinophils Relative: 2 % (ref 0–5)
Monocytes Absolute: 0.4 10*3/uL (ref 0.1–1.0)
Monocytes Relative: 6 % (ref 3–12)
Neutro Abs: 3.8 10*3/uL (ref 1.7–7.7)

## 2011-05-17 LAB — CLOSTRIDIUM DIFFICILE BY PCR: Toxigenic C. Difficile by PCR: NEGATIVE

## 2011-05-17 LAB — GLUCOSE, CAPILLARY
Glucose-Capillary: 124 mg/dL — ABNORMAL HIGH (ref 70–99)
Glucose-Capillary: 149 mg/dL — ABNORMAL HIGH (ref 70–99)

## 2011-05-17 LAB — RETICULOCYTES
RBC.: 2.53 MIL/uL — ABNORMAL LOW (ref 4.22–5.81)
Retic Ct Pct: 1.2 % (ref 0.4–3.1)

## 2011-05-17 LAB — PROTIME-INR
INR: 1.05 (ref 0.00–1.49)
Prothrombin Time: 13.9 seconds (ref 11.6–15.2)

## 2011-05-18 LAB — CBC
HCT: 27 % — ABNORMAL LOW (ref 39.0–52.0)
Hemoglobin: 8.8 g/dL — ABNORMAL LOW (ref 13.0–17.0)
MCH: 29.5 pg (ref 26.0–34.0)
MCV: 86.5 fL (ref 78.0–100.0)
Platelets: 151 10*3/uL (ref 150–400)
RBC: 3.12 MIL/uL — ABNORMAL LOW (ref 4.22–5.81)
RBC: 2.96 MIL/uL — ABNORMAL LOW (ref 4.22–5.81)
WBC: 5.7 10*3/uL (ref 4.0–10.5)
WBC: 6.7 10*3/uL (ref 4.0–10.5)

## 2011-05-18 LAB — BASIC METABOLIC PANEL
BUN: 23 mg/dL (ref 6–23)
CO2: 25 mEq/L (ref 19–32)
Calcium: 8.3 mg/dL — ABNORMAL LOW (ref 8.4–10.5)
GFR calc non Af Amer: 57 mL/min — ABNORMAL LOW (ref >60)
Glucose, Bld: 106 mg/dL — ABNORMAL HIGH (ref 70–99)
Potassium: 3.5 mEq/L (ref 3.5–5.1)
Sodium: 136 mEq/L (ref 135–145)

## 2011-05-18 LAB — CROSSMATCH
ABO/RH(D): O POS
Antibody Screen: NEGATIVE
Unit division: 0

## 2011-05-18 LAB — GLUCOSE, CAPILLARY: Glucose-Capillary: 102 mg/dL — ABNORMAL HIGH (ref 70–99)

## 2011-05-19 ENCOUNTER — Inpatient Hospital Stay (HOSPITAL_COMMUNITY): Payer: No Typology Code available for payment source

## 2011-05-19 DIAGNOSIS — K260 Acute duodenal ulcer with hemorrhage: Secondary | ICD-10-CM

## 2011-05-19 DIAGNOSIS — D62 Acute posthemorrhagic anemia: Secondary | ICD-10-CM

## 2011-05-19 LAB — CBC
HCT: 26.9 % — ABNORMAL LOW (ref 39.0–52.0)
MCHC: 34.6 g/dL (ref 30.0–36.0)
MCV: 87.4 fL (ref 78.0–100.0)
Platelets: 139 10*3/uL — ABNORMAL LOW (ref 150–400)
Platelets: 147 10*3/uL — ABNORMAL LOW (ref 150–400)
RBC: 2.85 MIL/uL — ABNORMAL LOW (ref 4.22–5.81)
RDW: 13.5 % (ref 11.5–15.5)
RDW: 13.7 % (ref 11.5–15.5)
WBC: 6.1 10*3/uL (ref 4.0–10.5)
WBC: 6.4 10*3/uL (ref 4.0–10.5)

## 2011-05-19 LAB — COMPREHENSIVE METABOLIC PANEL
ALT: 17 U/L (ref 0–53)
AST: 20 U/L (ref 0–37)
Albumin: 1.9 g/dL — ABNORMAL LOW (ref 3.5–5.2)
CO2: 23 mEq/L (ref 19–32)
Calcium: 8 mg/dL — ABNORMAL LOW (ref 8.4–10.5)
Creatinine, Ser: 1.16 mg/dL (ref 0.50–1.35)
Sodium: 136 mEq/L (ref 135–145)
Total Protein: 4.6 g/dL — ABNORMAL LOW (ref 6.0–8.3)

## 2011-05-19 LAB — RETICULOCYTES
RBC.: 2.85 MIL/uL — ABNORMAL LOW (ref 4.22–5.81)
Retic Count, Absolute: 34.2 10*3/uL (ref 19.0–186.0)
Retic Ct Pct: 1.2 % (ref 0.4–3.1)

## 2011-05-19 LAB — GLUCOSE, CAPILLARY
Glucose-Capillary: 143 mg/dL — ABNORMAL HIGH (ref 70–99)
Glucose-Capillary: 104 mg/dL — ABNORMAL HIGH (ref 70–99)

## 2011-05-19 NOTE — Consult Note (Signed)
NAMEMarland Kitchen  [name], [name] NO.:  [id number]  MEDICAL RECORD NO.:  [id number]  LOCATION:  3306                         FACILITY:  [facility]  PHYSICIAN:  Thana Farr, MD    DATE OF BIRTH:  1926-12-16  DATE OF CONSULTATION:  05/09/2011 DATE OF DISCHARGE:                                CONSULTATION   TIME:  1300.  REASON FOR CONSULTATION:  Altered mental status and possible right facial droop.  HISTORY OF PRESENT ILLNESS:  This is an 77 year old male who was recently diagnosed with acute cholangitis and biliary pancreatitis between the dates of April 19, 2011 and April 29, 2011.  The patient was discharged home from hospital on antibiotics.  However, due to wife noting that he was eating significantly less and also producing very little urine over the past 3 days, the patient was brought back to the hospital.  Upon return to the hospital, the patient's initial creatinine was 9.7, now resolved down to 7.72.  Since the patient has been hospitalized for the second time, his treatment for acute cholangitis with VRE and Pseudomonas has been a 10-day course of Zyvox.  The patient has now completed this 10-day course.  On the date of May 08, 2011, the patient was noted by hospitalist to have increased altered mental status and possible increased right facial droop.  Consultation for Neurology was recommended and asked for.  PAST SURGICAL HISTORY: 1. Cholecystectomy. 2. Pacemaker placement. 3. Pancreatic stent. 4. Biliary drain  PAST MEDICAL HISTORY: 1. TIA. 2. Dementia. 3. Diabetes. 4. COPD. 5. Hypercholesterolemia. 6. Myocardial infarct 2002 with bare metal stents. 7. Ischemic cardiomyopathy. 8. Bell palsy on the right.  MEDICATIONS:  Daptomycin, Cipro, benazepril, Zocor, Zetia, Toviaz, Plavix, omeprazole, metformin, magnesium, Diflucan, Coreg, aspirin, Aricept.  ALLERGIES:  PENICILLIN and DOXYCYCLINE.  SOCIAL HISTORY:  The patient does not smoke, drink,  or do illicit drugs. He is cared for by his wife.  REVIEW OF SYSTEMS:  Negative with the exception above.  PHYSICAL EXAMINATION:  VITAL SIGNS:  Blood pressure is 146/84, pulse 71, respirations 18, temperature 97.9. NEUROLOGIC:  The patient is alert.  He is oriented.  He is very hard of hearing, but he follows two-step commands without difficulty.  Pupils are equal, round, and reactive to light and accommodating, conjugate. Extraocular movements intact.  Visual fields grossly intact to double simultaneous stimuli.  The patient does show right facial droop at V2 and V3 region.  However, this is his baseline secondary to Bell palsy. I do note that he dysarthria and again this is secondary to his Bell palsy in the past and his wife states that this is his baseline.  Uvula is midline.  Facial sensation is intact.  Shoulder shrug, head turn was difficult as the patient has significant neck pain and is tending to hold his neck to the right; however, if asked, he will briefly move his head to the left and right, up and down without any difficulty.  I was trying to ascertain how long this neck pain has been present and he is unable to tell me this, but he states that his neck pain is all on the anterior portion and not in  the posterior and he denies any Lhermitte sign.  [name] coordination finger-to-nose is smooth.  Motor is 5/5 throughout.  Deep tendon reflexes are 2+ throughout, downgoing toes. The patient shows no drift in the upper or lower extremities.  Sensation is full to pinprick, light touch. PULMONARY:  Clear to auscultation. CARDIOVASCULAR:  S1 and S2 is audible. NECK:  Negative for bruits.  LABORATORY DATA:  Ammonia 15, AST 29, ALT 18, alk phos is 183.  Sodium 136, potassium 2.5, chloride 89, CO2 is 36, BUN 65, creatinine 6.89, glucose 95.  White blood cell count 11.1, hemoglobin 10.6, hematocrit 31.9, platelets 368.  IMAGING:  CT of head is negative.  MRI unfortunately  is unobtainable as the patient has a pacemaker.  ASSESSMENT:  This is an 77 year old Caucasian male with past medical history of dementia and right Bell palsy.  The patient presented to the hospital initially for acute cholangitis infection and has returned for the same.  While in the hospital, the patient showed a period of altered mental status lasting approximately 24 hours, which has now resolved.  In addition, it was also noted that the patient might have an increased right facial droop; however, wife who is at bedside states that this is his baseline.  At this time, most likely etiology is delirium in the setting of dementia with prolonged hospital stay.  RECOMMENDATIONS: 1. Continue treating infection. 2. Decrease sedating medications. 3. Keep day wake and night sleep pattern.  Dr. Thad Ranger will follow behind and make any other observations and recommendations.    Felicie Morn, PA-C   ______________________________ Thana Farr, MD   DS/MEDQ  D:  05/09/2011  T:  05/10/2011  Job:  [id number]  Electronically Signed by Felicie Morn PA-C on 05/11/2011 01:00:27 PM Electronically Signed by Thana Farr MD on 05/19/2011 09:55:01 PM

## 2011-05-20 DIAGNOSIS — D62 Acute posthemorrhagic anemia: Secondary | ICD-10-CM

## 2011-05-20 DIAGNOSIS — K260 Acute duodenal ulcer with hemorrhage: Secondary | ICD-10-CM

## 2011-05-20 LAB — COMPREHENSIVE METABOLIC PANEL
Albumin: 2.1 g/dL — ABNORMAL LOW (ref 3.5–5.2)
Alkaline Phosphatase: 109 U/L (ref 39–117)
BUN: 14 mg/dL (ref 6–23)
Calcium: 8.3 mg/dL — ABNORMAL LOW (ref 8.4–10.5)
GFR calc Af Amer: >60 mL/min (ref >60)
Glucose, Bld: 102 mg/dL — ABNORMAL HIGH (ref 70–99)
Potassium: 4.2 mEq/L (ref 3.5–5.1)
Sodium: 139 mEq/L (ref 135–145)
Total Protein: 4.8 g/dL — ABNORMAL LOW (ref 6.0–8.3)

## 2011-05-20 LAB — CBC
HCT: 25 % — ABNORMAL LOW (ref 39.0–52.0)
Hemoglobin: 8.6 g/dL — ABNORMAL LOW (ref 13.0–17.0)
MCH: 30.1 pg (ref 26.0–34.0)
MCHC: 34.4 g/dL (ref 30.0–36.0)
RDW: 13.6 % (ref 11.5–15.5)

## 2011-05-20 LAB — GLUCOSE, CAPILLARY
Glucose-Capillary: 106 mg/dL — ABNORMAL HIGH (ref 70–99)
Glucose-Capillary: 122 mg/dL — ABNORMAL HIGH (ref 70–99)
Glucose-Capillary: 138 mg/dL — ABNORMAL HIGH (ref 70–99)

## 2011-05-21 ENCOUNTER — Inpatient Hospital Stay (HOSPITAL_COMMUNITY): Payer: No Typology Code available for payment source

## 2011-05-21 LAB — COMPREHENSIVE METABOLIC PANEL
Alkaline Phosphatase: 104 U/L (ref 39–117)
BUN: 12 mg/dL (ref 6–23)
CO2: 25 mEq/L (ref 19–32)
Chloride: 110 mEq/L (ref 96–112)
Creatinine, Ser: 1.01 mg/dL (ref 0.50–1.35)
GFR calc Af Amer: >60 mL/min (ref >60)
GFR calc non Af Amer: >60 mL/min (ref >60)
Glucose, Bld: 97 mg/dL (ref 70–99)
Potassium: 4.4 mEq/L (ref 3.5–5.1)
Total Bilirubin: 0.2 mg/dL — ABNORMAL LOW (ref 0.3–1.2)

## 2011-05-21 LAB — CBC
HCT: 25.1 % — ABNORMAL LOW (ref 39.0–52.0)
MCHC: 34.3 g/dL (ref 30.0–36.0)
Platelets: 139 10*3/uL — ABNORMAL LOW (ref 150–400)
RDW: 13.5 % (ref 11.5–15.5)
WBC: 5.9 10*3/uL (ref 4.0–10.5)

## 2011-05-21 LAB — GLUCOSE, CAPILLARY
Glucose-Capillary: 103 mg/dL — ABNORMAL HIGH (ref 70–99)
Glucose-Capillary: 129 mg/dL — ABNORMAL HIGH (ref 70–99)

## 2011-05-21 LAB — H. PYLORI ANTIBODY, IGG: H Pylori IgG: 1.05 {ISR} — ABNORMAL HIGH

## 2011-05-22 ENCOUNTER — Inpatient Hospital Stay (HOSPITAL_COMMUNITY): Payer: No Typology Code available for payment source

## 2011-05-22 LAB — HEPATIC FUNCTION PANEL
AST: 193 U/L — ABNORMAL HIGH (ref 0–37)
Albumin: 2 g/dL — ABNORMAL LOW (ref 3.5–5.2)
Bilirubin, Direct: 0.1 mg/dL (ref 0.0–0.3)
Total Bilirubin: 0.4 mg/dL (ref 0.3–1.2)

## 2011-05-22 LAB — GLUCOSE, CAPILLARY
Glucose-Capillary: 102 mg/dL — ABNORMAL HIGH (ref 70–99)
Glucose-Capillary: 106 mg/dL — ABNORMAL HIGH (ref 70–99)

## 2011-05-22 LAB — COMPREHENSIVE METABOLIC PANEL
AST: 315 U/L — ABNORMAL HIGH (ref 0–37)
CO2: 26 mEq/L (ref 19–32)
Calcium: 8.6 mg/dL (ref 8.4–10.5)
Creatinine, Ser: 1.03 mg/dL (ref 0.50–1.35)
GFR calc Af Amer: >60 mL/min (ref >60)
GFR calc non Af Amer: >60 mL/min (ref >60)
Glucose, Bld: 111 mg/dL — ABNORMAL HIGH (ref 70–99)
Total Protein: 4.8 g/dL — ABNORMAL LOW (ref 6.0–8.3)

## 2011-05-22 LAB — CBC
MCH: 29.4 pg (ref 26.0–34.0)
MCHC: 33.7 g/dL (ref 30.0–36.0)
MCV: 87.2 fL (ref 78.0–100.0)
Platelets: 121 10*3/uL — ABNORMAL LOW (ref 150–400)
RBC: 2.82 MIL/uL — ABNORMAL LOW (ref 4.22–5.81)

## 2011-05-22 MED ORDER — IOHEXOL 300 MG/ML  SOLN
50.0000 mL | Freq: Once | INTRAMUSCULAR | Status: AC | PRN
  Administered 2011-05-22: 20 mL via INTRAVENOUS

## 2011-05-23 LAB — DIFFERENTIAL
Basophils Relative: 0 % (ref 0–1)
Eosinophils Absolute: 0.1 10*3/uL (ref 0.0–0.7)
Lymphs Abs: 1.3 10*3/uL (ref 0.7–4.0)
Monocytes Relative: 11 % (ref 3–12)
Neutro Abs: 4.8 10*3/uL (ref 1.7–7.7)
Neutrophils Relative %: 69 % (ref 43–77)

## 2011-05-23 LAB — COMPREHENSIVE METABOLIC PANEL
ALT: 124 U/L — ABNORMAL HIGH (ref 0–53)
AST: 95 U/L — ABNORMAL HIGH (ref 0–37)
Albumin: 2 g/dL — ABNORMAL LOW (ref 3.5–5.2)
Alkaline Phosphatase: 652 U/L — ABNORMAL HIGH (ref 39–117)
Chloride: 105 mEq/L (ref 96–112)
Potassium: 3.4 mEq/L — ABNORMAL LOW (ref 3.5–5.1)
Sodium: 138 mEq/L (ref 135–145)
Total Bilirubin: 0.4 mg/dL (ref 0.3–1.2)
Total Protein: 4.8 g/dL — ABNORMAL LOW (ref 6.0–8.3)

## 2011-05-23 LAB — CBC
Hemoglobin: 8 g/dL — ABNORMAL LOW (ref 13.0–17.0)
MCH: 30.1 pg (ref 26.0–34.0)
Platelets: 133 10*3/uL — ABNORMAL LOW (ref 150–400)
RBC: 2.66 MIL/uL — ABNORMAL LOW (ref 4.22–5.81)
WBC: 7 10*3/uL (ref 4.0–10.5)

## 2011-05-23 LAB — GLUCOSE, CAPILLARY
Glucose-Capillary: 97 mg/dL (ref 70–99)
Glucose-Capillary: 153 mg/dL — ABNORMAL HIGH (ref 70–99)

## 2011-05-23 LAB — PROTIME-INR: Prothrombin Time: 14.8 seconds (ref 11.6–15.2)

## 2011-05-24 LAB — CBC
Hemoglobin: 7.8 g/dL — ABNORMAL LOW (ref 13.0–17.0)
MCV: 87.7 fL (ref 78.0–100.0)
Platelets: 142 10*3/uL — ABNORMAL LOW (ref 150–400)
RBC: 2.6 MIL/uL — ABNORMAL LOW (ref 4.22–5.81)
WBC: 6 10*3/uL (ref 4.0–10.5)

## 2011-05-24 LAB — COMPREHENSIVE METABOLIC PANEL
ALT: 76 U/L — ABNORMAL HIGH (ref 0–53)
AST: 32 U/L (ref 0–37)
CO2: 24 mEq/L (ref 19–32)
Chloride: 106 mEq/L (ref 96–112)
Creatinine, Ser: 1.11 mg/dL (ref 0.50–1.35)
GFR calc Af Amer: >60 mL/min (ref >60)
GFR calc non Af Amer: >60 mL/min (ref >60)
Glucose, Bld: 113 mg/dL — ABNORMAL HIGH (ref 70–99)
Total Bilirubin: 0.2 mg/dL — ABNORMAL LOW (ref 0.3–1.2)

## 2011-05-24 LAB — GLUCOSE, CAPILLARY
Glucose-Capillary: 124 mg/dL — ABNORMAL HIGH (ref 70–99)
Glucose-Capillary: 114 mg/dL — ABNORMAL HIGH (ref 70–99)

## 2011-05-25 ENCOUNTER — Inpatient Hospital Stay (HOSPITAL_COMMUNITY): Payer: No Typology Code available for payment source

## 2011-05-25 ENCOUNTER — Inpatient Hospital Stay (HOSPITAL_COMMUNITY)
Admission: RE | Admit: 2011-05-25 | Discharge: 2011-05-31 | DRG: 945 | Disposition: A | Discharge status: Home Health | Payer: No Typology Code available for payment source | Source: Other Acute Inpatient Hospital | Attending: Physical Medicine & Rehabilitation | Admitting: Physical Medicine & Rehabilitation

## 2011-05-25 DIAGNOSIS — E119 Type 2 diabetes mellitus without complications: Secondary | ICD-10-CM | POA: Diagnosis present

## 2011-05-25 DIAGNOSIS — Z888 Allergy status to other drugs, medicaments and biological substances status: Secondary | ICD-10-CM

## 2011-05-25 DIAGNOSIS — Z950 Presence of cardiac pacemaker: Secondary | ICD-10-CM

## 2011-05-25 DIAGNOSIS — J449 Chronic obstructive pulmonary disease, unspecified: Secondary | ICD-10-CM | POA: Diagnosis present

## 2011-05-25 DIAGNOSIS — H9190 Unspecified hearing loss, unspecified ear: Secondary | ICD-10-CM | POA: Diagnosis present

## 2011-05-25 DIAGNOSIS — K8309 Other cholangitis: Secondary | ICD-10-CM | POA: Diagnosis present

## 2011-05-25 DIAGNOSIS — D62 Acute posthemorrhagic anemia: Secondary | ICD-10-CM

## 2011-05-25 DIAGNOSIS — J4489 Other specified chronic obstructive pulmonary disease: Secondary | ICD-10-CM | POA: Diagnosis present

## 2011-05-25 DIAGNOSIS — Z8673 Personal history of transient ischemic attack (TIA), and cerebral infarction without residual deficits: Secondary | ICD-10-CM

## 2011-05-25 DIAGNOSIS — Z833 Family history of diabetes mellitus: Secondary | ICD-10-CM

## 2011-05-25 DIAGNOSIS — R5381 Other malaise: Secondary | ICD-10-CM

## 2011-05-25 DIAGNOSIS — G609 Hereditary and idiopathic neuropathy, unspecified: Secondary | ICD-10-CM | POA: Diagnosis present

## 2011-05-25 DIAGNOSIS — Z5189 Encounter for other specified aftercare: Secondary | ICD-10-CM

## 2011-05-25 DIAGNOSIS — F0391 Unspecified dementia with behavioral disturbance: Secondary | ICD-10-CM | POA: Diagnosis present

## 2011-05-25 DIAGNOSIS — F0390 Unspecified dementia without behavioral disturbance: Secondary | ICD-10-CM

## 2011-05-25 DIAGNOSIS — E876 Hypokalemia: Secondary | ICD-10-CM | POA: Diagnosis present

## 2011-05-25 DIAGNOSIS — K922 Gastrointestinal hemorrhage, unspecified: Secondary | ICD-10-CM

## 2011-05-25 DIAGNOSIS — D649 Anemia, unspecified: Secondary | ICD-10-CM | POA: Diagnosis present

## 2011-05-25 DIAGNOSIS — I428 Other cardiomyopathies: Secondary | ICD-10-CM | POA: Diagnosis present

## 2011-05-25 DIAGNOSIS — F03918 Unspecified dementia, unspecified severity, with other behavioral disturbance: Secondary | ICD-10-CM | POA: Diagnosis present

## 2011-05-25 DIAGNOSIS — N179 Acute kidney failure, unspecified: Secondary | ICD-10-CM | POA: Diagnosis present

## 2011-05-25 DIAGNOSIS — I252 Old myocardial infarction: Secondary | ICD-10-CM

## 2011-05-25 DIAGNOSIS — Z880 Allergy status to penicillin: Secondary | ICD-10-CM

## 2011-05-25 LAB — COMPREHENSIVE METABOLIC PANEL
Albumin: 2.2 g/dL — ABNORMAL LOW (ref 3.5–5.2)
BUN: 11 mg/dL (ref 6–23)
Chloride: 110 mEq/L (ref 96–112)
Creatinine, Ser: 0.9 mg/dL (ref 0.50–1.35)
GFR calc non Af Amer: >60 mL/min (ref >60)
Total Bilirubin: 0.2 mg/dL — ABNORMAL LOW (ref 0.3–1.2)

## 2011-05-25 LAB — GLUCOSE, CAPILLARY
Glucose-Capillary: 102 mg/dL — ABNORMAL HIGH (ref 70–99)
Glucose-Capillary: 96 mg/dL (ref 70–99)

## 2011-05-25 LAB — CROSSMATCH

## 2011-05-25 LAB — CBC
HCT: 27.8 % — ABNORMAL LOW (ref 39.0–52.0)
MCHC: 34.2 g/dL (ref 30.0–36.0)
MCV: 86.9 fL (ref 78.0–100.0)
RDW: 13.7 % (ref 11.5–15.5)
WBC: 7.9 10*3/uL (ref 4.0–10.5)

## 2011-05-25 MED ORDER — IOHEXOL 300 MG/ML  SOLN: 100.0000 mL | Freq: Once | INTRAMUSCULAR | Status: DC | PRN

## 2011-05-26 DIAGNOSIS — D62 Acute posthemorrhagic anemia: Secondary | ICD-10-CM

## 2011-05-26 DIAGNOSIS — K922 Gastrointestinal hemorrhage, unspecified: Secondary | ICD-10-CM

## 2011-05-26 DIAGNOSIS — R5381 Other malaise: Secondary | ICD-10-CM

## 2011-05-26 DIAGNOSIS — F0390 Unspecified dementia without behavioral disturbance: Secondary | ICD-10-CM

## 2011-05-26 LAB — CBC
HCT: 27.3 % — ABNORMAL LOW (ref 39.0–52.0)
Hemoglobin: 9.2 g/dL — ABNORMAL LOW (ref 13.0–17.0)
MCH: 29.5 pg (ref 26.0–34.0)
MCV: 87.5 fL (ref 78.0–100.0)
RBC: 3.12 MIL/uL — ABNORMAL LOW (ref 4.22–5.81)

## 2011-05-26 LAB — GLUCOSE, CAPILLARY
Glucose-Capillary: 108 mg/dL — ABNORMAL HIGH (ref 70–99)
Glucose-Capillary: 99 mg/dL (ref 70–99)

## 2011-05-27 LAB — COMPREHENSIVE METABOLIC PANEL WITH GFR
ALT: 28 U/L (ref 0–53)
AST: 16 U/L (ref 0–37)
Albumin: 2.1 g/dL — ABNORMAL LOW (ref 3.5–5.2)
Alkaline Phosphatase: 338 U/L — ABNORMAL HIGH (ref 39–117)
BUN: 13 mg/dL (ref 6–23)
CO2: 21 meq/L (ref 19–32)
Calcium: 8.2 mg/dL — ABNORMAL LOW (ref 8.4–10.5)
Chloride: 107 meq/L (ref 96–112)
Creatinine, Ser: 1.06 mg/dL (ref 0.50–1.35)
GFR calc Af Amer: >60 mL/min
GFR calc non Af Amer: >60 mL/min
Glucose, Bld: 97 mg/dL (ref 70–99)
Potassium: 3.6 meq/L (ref 3.5–5.1)
Sodium: 137 meq/L (ref 135–145)
Total Bilirubin: 0.3 mg/dL (ref 0.3–1.2)
Total Protein: 4.6 g/dL — ABNORMAL LOW (ref 6.0–8.3)

## 2011-05-27 LAB — GLUCOSE, CAPILLARY
Glucose-Capillary: 96 mg/dL (ref 70–99)
Glucose-Capillary: 119 mg/dL — ABNORMAL HIGH (ref 70–99)
Glucose-Capillary: 139 mg/dL — ABNORMAL HIGH (ref 70–99)
Glucose-Capillary: 94 mg/dL (ref 70–99)

## 2011-05-27 LAB — CBC
HCT: 26.9 % — ABNORMAL LOW (ref 39.0–52.0)
Hemoglobin: 9.1 g/dL — ABNORMAL LOW (ref 13.0–17.0)
MCH: 29.4 pg (ref 26.0–34.0)
MCHC: 33.8 g/dL (ref 30.0–36.0)
MCV: 86.8 fL (ref 78.0–100.0)
Platelets: 196 K/uL (ref 150–400)
RBC: 3.1 MIL/uL — ABNORMAL LOW (ref 4.22–5.81)
RDW: 14.3 % (ref 11.5–15.5)
WBC: 6.6 K/uL (ref 4.0–10.5)

## 2011-05-28 DIAGNOSIS — I6330 Cerebral infarction due to thrombosis of unspecified cerebral artery: Secondary | ICD-10-CM

## 2011-05-28 DIAGNOSIS — I69993 Ataxia following unspecified cerebrovascular disease: Secondary | ICD-10-CM

## 2011-05-28 LAB — CBC
HCT: 27.3 % — ABNORMAL LOW (ref 39.0–52.0)
MCH: 29.5 pg (ref 26.0–34.0)
MCHC: 33.7 g/dL (ref 30.0–36.0)
RDW: 14.5 % (ref 11.5–15.5)

## 2011-05-28 LAB — COMPREHENSIVE METABOLIC PANEL
Albumin: 2.2 g/dL — ABNORMAL LOW (ref 3.5–5.2)
Alkaline Phosphatase: 321 U/L — ABNORMAL HIGH (ref 39–117)
BUN: 12 mg/dL (ref 6–23)
Calcium: 8.3 mg/dL — ABNORMAL LOW (ref 8.4–10.5)
GFR calc Af Amer: >60 mL/min (ref >60)
Potassium: 3.6 mEq/L (ref 3.5–5.1)
Total Protein: 4.8 g/dL — ABNORMAL LOW (ref 6.0–8.3)

## 2011-05-28 LAB — GLUCOSE, CAPILLARY
Glucose-Capillary: 110 mg/dL — ABNORMAL HIGH (ref 70–99)
Glucose-Capillary: 117 mg/dL — ABNORMAL HIGH (ref 70–99)

## 2011-05-28 LAB — CULTURE, BLOOD (ROUTINE X 2)
Culture  Setup Time: 201208282016
Culture  Setup Time: 201208282016
Culture: NO GROWTH

## 2011-05-29 DIAGNOSIS — D62 Acute posthemorrhagic anemia: Secondary | ICD-10-CM

## 2011-05-29 DIAGNOSIS — K922 Gastrointestinal hemorrhage, unspecified: Secondary | ICD-10-CM

## 2011-05-29 DIAGNOSIS — R5381 Other malaise: Secondary | ICD-10-CM

## 2011-05-29 DIAGNOSIS — F0390 Unspecified dementia without behavioral disturbance: Secondary | ICD-10-CM

## 2011-05-29 LAB — DIFFERENTIAL
Basophils Absolute: 0 10*3/uL (ref 0.0–0.1)
Lymphocytes Relative: 30 % (ref 12–46)
Lymphs Abs: 1.6 10*3/uL (ref 0.7–4.0)
Monocytes Absolute: 0.6 10*3/uL (ref 0.1–1.0)
Monocytes Relative: 12 % (ref 3–12)
Neutro Abs: 3 10*3/uL (ref 1.7–7.7)

## 2011-05-29 LAB — COMPREHENSIVE METABOLIC PANEL
Alkaline Phosphatase: 282 U/L — ABNORMAL HIGH (ref 39–117)
BUN: 13 mg/dL (ref 6–23)
Chloride: 108 mEq/L (ref 96–112)
GFR calc Af Amer: >60 mL/min (ref >60)
Glucose, Bld: 103 mg/dL — ABNORMAL HIGH (ref 70–99)
Potassium: 3.4 mEq/L — ABNORMAL LOW (ref 3.5–5.1)
Total Bilirubin: 0.5 mg/dL (ref 0.3–1.2)
Total Protein: 4.5 g/dL — ABNORMAL LOW (ref 6.0–8.3)

## 2011-05-29 LAB — CBC
HCT: 25.1 % — ABNORMAL LOW (ref 39.0–52.0)
Hemoglobin: 8.6 g/dL — ABNORMAL LOW (ref 13.0–17.0)
MCHC: 34.3 g/dL (ref 30.0–36.0)
MCV: 86.9 fL (ref 78.0–100.0)

## 2011-05-29 LAB — WOUND CULTURE

## 2011-05-29 LAB — GLUCOSE, CAPILLARY: Glucose-Capillary: 119 mg/dL — ABNORMAL HIGH (ref 70–99)

## 2011-05-30 DIAGNOSIS — R5381 Other malaise: Secondary | ICD-10-CM

## 2011-05-30 DIAGNOSIS — K922 Gastrointestinal hemorrhage, unspecified: Secondary | ICD-10-CM

## 2011-05-30 DIAGNOSIS — F0390 Unspecified dementia without behavioral disturbance: Secondary | ICD-10-CM

## 2011-05-30 DIAGNOSIS — D62 Acute posthemorrhagic anemia: Secondary | ICD-10-CM

## 2011-05-30 LAB — HEMOGLOBIN AND HEMATOCRIT, BLOOD: Hemoglobin: 8.8 g/dL — ABNORMAL LOW (ref 13.0–17.0)

## 2011-05-30 LAB — GLUCOSE, CAPILLARY: Glucose-Capillary: 118 mg/dL — ABNORMAL HIGH (ref 70–99)

## 2011-05-31 LAB — GLUCOSE, CAPILLARY: Glucose-Capillary: 100 mg/dL — ABNORMAL HIGH (ref 70–99)

## 2011-05-31 NOTE — Progress Notes (Signed)
NAMEMarland Kitchen  [name], [name] NO.:  [id number]  MEDICAL RECORD NO.:  [id number]  LOCATION:  5527                         FACILITY:  [facility]  PHYSICIAN:  Ramiro Harvest, MD    DATE OF BIRTH:  1926/10/05                                PROGRESS NOTE   PRIMARY CARE PHYSICIAN: Marjory Lies, MD  WORKING DIAGNOSES: 1. Acute renal failure significantly improved currently with a     creatinine of 2. 2. Cholangitis with Vancomycin-resistant enterococci and Pseudomonas     currently on Zyvox and ceftazidime per Infectious Disease,     antibiotics to be finished May 19, 2011. Biliary drain in place now capped, biliary stenting planned with Dr. Jeani Hawking at a future date. 3. Hypertension currently resuming home medications. 4. Altered mental status with delirium currently resolved.  The     patient has mild baseline dementia. 5. Bells palsy. 6. Anemia-of-chronic disease with mild folate deficiency. 7. History of coronary artery disease with myocardial infarction, bare-     metal stenting, cardiomyopathy with an ejection fraction of 45%,     and atrioventricular block status post pacemaker placement. 8. History of transient ischemic attack. 9. The patient is severely deconditioned.  HISTORY AND BRIEF HOSPITAL COURSE: Please see the previous notes from his admission on May 02, 2011, and progress note dated May 08, 2011, but in summary, this is an extremely pleasant 77 year old gentleman with a past medical history listed above who came in on May 02, 2011, with acute renal failure, possibly secondary to a combination of medications including benazepril, metformin, daptomycin, and Cipro as well as possibly severe dehydration with voluminous biliary drain output.  He was seen by Dr. Terrial Rhodes of the Renal Service and was transferred to [facility] for possible dialysis, however, by withholding medications and providing IV fluids, the patient's  creatinine has slowly come down from a peak of 10 to now 2.01.  Dialysis was not utilized.  Next, the patient in late July was diagnosed with cholangitis, a biliary drain was placed at that time, the patient became septic, the drainage was cultured and found to grow VRE as well as Pseudomonas.  The patient was placed on antibiotics and discharged home.  When he came into the hospital this time on May 02, 2011, he was started on antibiotic therapy for another 10 days.  He completed this and 48 hours after his antibiotics were discontinued, the patient developed lethargy and delirium.  Also, his Bell palsy became active and he developed severe right-sided facial droop.  There was concern that the patient could have had neurologic changes and possible stroke.  Neurology was called and saw the patient in consultation on May 09, 2011.  He was seen by Dr. Thana Farr who felt that the patient did not have a stroke that his altered mental status was due to do delirium in the setting of dementia and prolonged hospital stay and that the Bell palsy with the cause of his right-sided facial droop.  Further on May 09, 2011, because of the acute mental status changes, Critical Care was called and the patient was seen in consultation by Dr. Coralyn Helling who reviewed with  the primary [facility] that was doing for the patient and felt that it was the correct course of action.  Consequently, he deferred further management to the primary team.  Also, Dr. Paulette Blanch Dam was contacted on May 09, 2011, and antibiotics were restarted for the patient's biliary infection.  The patient was placed on 10 days of Zyvox and ceftazidime.  The patient quickly improved after being placed back on antibiotics and over the course of the next 3-4 days, his mental status returned to his baseline.  Currently, the patient is alert.  He is oriented.  He speaks with slightly slurred speech, however, he  is smiling and in good spirits answering questions appropriately.  Hypertension.  During the course of this hospitalization, the patient's blood pressure has been low until approximately May 12, 2011, today and reviewing his blood pressure trends it appears that his blood pressure is high enough to resume his home blood pressure medications including Coreg 12.5 mg b.i.d.  Anemia-of-chronic disease.  An anemia panel was drawn.  It appears that the patient's iron stores are good.  He is mildly folate deficient.  We will start him on folic acid today.  Biliary drain that was initially placed in association with his cholangitis is now capped.  Interventional Radiology has capped it and it is draining back into his GI tract.  There is stenting of his biliary tree planned by Dr. Jeani Hawking at some appropriate point in the future.  History of coronary artery disease with MI, stenting, cardiomyopathy, and an EF of 45%.  This has remained stable during this hospitalization. The patient is resuming his Plavix.  History of TIA.  Again, the patient is resuming his Plavix.  DISPOSITION: The patient is to be evaluated by [facility] today. Hopefully, they can accept him and he will go to Rehab.  Otherwise, he will go to skilled nursing facility until he is strong enough to return home to his wife.  PERTINENT RADIOLOGICAL EXAMINATION: Since May 07, 2011, the patient had a CT head without contrast on May 08, 2011, that showed no acute intracranial abnormality.  He had a chest x-ray on May 08, 2011, that showed right lower lobe opacity likely a combination of atelectasis and moderate pleural effusion, suspect small left pleural effusion.  CT of his head on May 11, 2011, showed no identifiable acute abnormality.  Atrophy and extensive chronic/old ischemic changes.  MICROBIOLOGY: The patient had blood cultures drawn on May 09, 2011.  These showed no growth after 5  days and this is final.  Other pertinent laboratory results since May 07, 2011, the patient had an anemia panel done that shows iron at 82, TIBC of 180, percent saturation of 46, vitamin B12 of 371, serum folate at 5.7, and serum ferritin at 242.  Of some concern, the patient has been eating very small percentage of his meals may be 10%.  He denies any odynophagia or dysphagia.  He has been seen by Speech Therapy and placed on mechanical soft diet with thin liquids.  However, he is only eating approximately 10% of his meals as he just has no appetite.  We will ask Nutrition to see him in consultation to make any recommendations.  Current serum albumin on May 14, 2011, was 1.9.  PHYSICAL EXAMINATION: GENERAL:  Today, the patient is alert and oriented.  He smile at me when I walk in the room. VITAL SIGNS:  Temperature 98.3, pulse 85, respirations 20, blood pressure is currently  171/98, O2 sat is 99% on 2 liters. HEAD:  Atraumatic, normocephalic. EYES:  Anicteric with pupils are equal and round. EARS:  The patient wears hearing aids. NOSE:  Shows no nasal discharge or exterior lesions. MOUTH:  Moist mucous membranes, moderate dentition. NECK:  Supple with midline trachea.  No JVD.  No lymphadenopathy. HEART:  Regular rate and rhythm without obvious murmurs, rubs or gallops. ABDOMEN:  Soft, nontender, nondistended with decreased bowel sounds. Biliary drain is in place.  It's bandage is clean and dry.  It is nontender around the biliary drain area. EXTREMITIES:  His lower extremities show no clubbing, cyanosis or edema. Upper extremity edema has improved significantly.  He now only has a minimal amount bilaterally. NEURO:  The patient does retain slurred speech.  His facial droop has improved.  Otherwise, he seems neurologically intact. PSYCHIATRIC:  The patient is alert and oriented.  Demeanor is cooperative and appropriate.  CURRENT MEDICATIONS: 1. Coreg 12.5 mg p.o.  b.i.d. 2. Zocor 40 mg Monday, Wednesday, Friday. 3. Zetia 10 mg daily. 4. Plavix 75 mg daily. 5. Aricept 5 mg at bedtime. 6. Aspirin 162 mg p.o. daily. 7. Ceftazidime 1 g IV q. 24, stop on May 19, 2011. 8. NovoLog insulin 1-9 units subcutaneously q. 4 hours sliding scale. 9. Zyvox 600 mg p.o. b.i.d., stop on May 19, 2011. 10.The patient is still on IV fluids including normal saline at 100 an     hour and he also has Zofran 4 mg IV q. 6 h. p.r.n. and     nitroglycerin 0.4 mg sublingual q. 5 minutes p.r.n. chest pain x3.  DISPOSITION: The patient is medically ready to go onto the next step that may be [facility] as we are awaiting their evaluation or possibly rehabilitation at skilled nursing facility.  It has been a pleasure taking care of Mr. [name].     Stephani Police, PA   ______________________________ Ramiro Harvest, MD    MLY/MEDQ  D:  05/15/2011  T:  05/15/2011  Job:  [id number]  cc:   Marjory Lies, M.D. Fax: [phone]  Electronically Signed by Algis Downs PA on 05/18/2011 08:55:45 AM Electronically Signed by Ramiro Harvest MD on 05/31/2011 12:05:56 PM

## 2011-05-31 NOTE — Discharge Summary (Signed)
NAMEMarland Kitchen  [name], [name] NO.:  [id number]  MEDICAL RECORD NO.:  [id number]  LOCATION:  1512                         FACILITY:  [facility]  PHYSICIAN:  Ramiro Harvest, MD    DATE OF BIRTH:  March 02, 1927  DATE OF ADMISSION:  04/19/2011 DATE OF DISCHARGE:  04/29/2011                        DISCHARGE SUMMARY    PRIMARY CARE PHYSICIAN:  Marjory Lies, M.D.  INFECTIOUS DISEASES:  Gardiner Barefoot, M.D.  GASTROENTEROLOGY:  Jordan Hawks. Elnoria Howard, M.D.  INTERVENTIONAL RADIOLOGY:  Art A. Hoss, M.D.  DISCHARGE DIAGNOSES: 1. Acute cholangitis. 2. Biliary pancreatitis. 3. Hypertension. 4. History of recent myocardial infarction status post bare-metal     stent to the left anterior descending in April 2012. 5. Dementia. 6. History of ischemic cardiomyopathy, ejection fraction of 45%. 7. Prior history of gallstone pancreatitis with elevated LFTs. 8. History of second degree atrioventricular block status post [employer] permanent pacemaker. 9. Hypertension. 10.Diabetes. 11.Hyperlipidemia. 12.Obesity. 13.Status post cholecystectomy. 14.History of transient ischemic attacks. 15.Gastroesophageal reflux disease.  DISCHARGE MEDICATIONS: 1. Benazepril 20 mg p.o. daily. 2. Ciprofloxacin 500 mg p.o. b.i.d. x10 days. 3. Daptomycin 350 mg IV daily x13 days. 4. Fluconazole 200 mg p.o. daily x10 days. 5. Aricept 5 mg p.o. q.h.s. 6. Aspirin 325 mg half a tablet p.o. daily. 7. Coreg 12.5 mg p.o. b.i.d. 8. Magnesium sulfate 500 mg p.o. daily. 9. Metformin 500 mg p.o. b.i.d. 10.Multivitamin 1 tablet p.o. daily. 11.Nitroglycerin sublingual 0.4 mg sublingual q.5 minutes x3 p.r.n.     chest pain. 12.Omeprazole 20 mg p.o. b.i.d. 13.Plavix 75 mg p.o. daily. 14.MiraLax 17 g p.o. daily. 15.Toviaz 4 mg p.o. daily. 16.Zetia 10 mg p.o. daily.  DISPOSITION AND FOLLOWUP:  The patient will be discharged home.  The patient is to follow up with PCP in 2 weeks on followup CMET will need to be  checked, to follow up on his LFTs as well as electrolytes and renal function.  CBC will need to be checked to follow up on his H and H.  The patient is to follow up with Interventional Radiology on May 03, 2011, for a temporary biliary stent placement/internalization of biliary drain.  The patient will be discharged home with home health RN and the patient will need the drain to be flushed daily with 5-10 mL of sterile normal saline with dressing changes daily with a 4x4 gauze.  The patient is to record daily drainage output.  The patient will also be called by Infectious Disease for followup in 2 weeks postdischarge to follow up on his acute cholangitis.  The patient is also to follow up with Dr. Elnoria Howard in the next 2-3 weeks to follow up on his acute cholangitis and biliary pancreatitis.  CONSULTATIONS DONE: 1. GI consultation was done.  The patient was seen in consultation by     Dr. Jeani Hawking on April 20, 2011. 2. ID consultation was done.  The patient was seen in consultation by     Dr. Staci Righter on April 26, 2011.  PROCEDURES PERFORMED:  A CT of the abdomen and pelvis was done on April 19, 2011 that showed chronic biliary ductal dilatation, mildly prominent proximal small bowel loops without clear-cut  signs of obstruction or enteritis.  A biliary stent was placed on April 23, 2011, per Dr. Oley Balm.  Initial biliary drains were placed April 23, 2011, per Dr. Oley Balm and biliary drains were replaced on April 28, 2011, secondary to dislodgement mechanically during the hospitalization on 04/27/2011 per Dr. Bonnielee Haff and this was a percutaneous biliary internal/external drain catheter, was placed on April 23, 2011.  A PICC line was placed on April 27, 2011.  An upper endoscopic ultrasound was done on April 20, 2011, per Dr. Elnoria Howard which showed a dilated common bile duct without evidence of stones, sludge, or masses ? SOD type 1.  BRIEF ADMISSION HISTORY AND PHYSICAL:  Mr.  [name] is an 77 year old elderly Caucasian gentleman with history of recent MI in April 2012 status post bare-metal stent into the LAD with an EF of 45%, also during that hospitalization had gallstone pancreatitis and elevated LFTs.  The patient had been following GI as an outpatient, actually scheduled on the morning of admission for biliary stent placement.  They have been trying to do this as an outpatient.  Some of the history was obtained from [name], however, due to his extreme hard of hearing, it was difficult to communicate with him and most of the history was obtained from past medical records and the ED records per admitting physician. Apparently, the patient had presented to [facility] ED due to worsening abdominal pain, which was relieved with IV pain medications over there.  There was no report of nausea or vomiting, no report of fever.  He went to the ED because of uncontrolled pain, was transferred to [facility] as he was scheduled to have biliary stenting in the morning with Dr. Elnoria Howard anyway.  The patient who is currently on the floor at [place], appeared comfortable, denied any pain.  However, rest of the history was not obtainable, but he could answer that he was not in any pain and did not appear if he had any pain.  For rest of admission history and physical, please see H and P dictated by Dr. Onalee Hua of job number [id number].  HOSPITAL COURSE: 1. Acute cholangitis/biliary pancreatitis.  The patient on admission     was admitted for possible biliary pancreatitis and worsening     abdominal pain on April 19, 2011, was transferred from the ED from     [facility] ED.  At that time, lipase levels     were elevated at 762.  It was felt that the patient did have a     biliary pancreatitis as he had a history of prior biliary     pancreatitis.  [name] did have a transaminitis, which was     likely secondary to the biliary  pancreatitis.  The patient was     initially placed on bowel rest, hydrated with IV fluids and with     pain management as well.  The patient was initially kept n.p.o.  GI     consultation was obtained.  The patient was seen in consultation by     Dr. Jeani Hawking on April 20, 2011 and at that time, it was felt the     patient did have a recurrent pancreatitis presumably from     gallstones or sludge.  It was noted that the patient was scheduled     to have a repeat ERCP with stent placement and prior to his     hospitalization and  as such an EUS was scheduled.  The patient on     admission did have a leukocytosis with elevated white count going     up to about 34.6 thousand, which was initially felt to be secondary     to his pancreatitis.  The patient subsequently underwent an upper     EUS on April 20, 2011, per Dr. Elnoria Howard and was noted to have a dilated     common bile duct without evidence of stones, sludge, or masses ?     SOD type 1.  Interventional Radiology was consulted per GI for     drain placement.  The patient subsequently during the     hospitalization started to have some low grade fevers and     empirically started on IV Primaxin.  The patient did have some     improvement in his leukocytosis on April 23, 2011.  Ultrasound-guided percutaneous biliary internal-external drain catheter was     placed by Dr. Oley Balm on April 23, 2011.  At that time,     cultures were also obtained and wound cultures were obtained with a     drain placement.  Wound cultures did come out positive for     Pseudomonas aeruginosa, also came out for vancomycin-resistant     Enterococcus species as well as candida.  Blood cultures which were     obtained were negative x2.  Due to the patient's elevated white     count and acute cholangitis with fevers and abdominal pain, on     presentation ID consultation was obtained for antibiotic duration     and management and it was recommended due to the  patient's     penicillin allergy, Primaxin was discontinued.  The patient was     placed on IV vancomycin to cover for enterococcus where as cultures     were pending as the patient was unable to be placed on ampicillin     secondary to his penicillin allergy.  The patient was also placed     on oral Cipro to cover for gram-negative rods as well as oral     Diflucan for the Candida species, which were growing.  The patient     remained in stable condition, remained afebrile and continuing to     know a trending down of his leukocytosis on empiric antibiotic     therapy.  During the hospitalization on April 27, 2011, the patient     did have a dislodgement of his biliary catheter drain and had to be     taken back to the interventional suite on April 28, 2011 for     replacement of his drain on April 28, 2011 per Dr. Bonnielee Haff.  The     patient tolerated the procedure well and remained in stable and     improved condition.  Once culture results had come back positive     for VRE, vancomycin was discontinued.  He was started on IV     daptomycin.  The patient was discharged home on IV daptomycin as     well as oral Cipro and oral Diflucan for a total of 14 weeks of     antibiotic therapy.  The patient was subsequently followed up with     Dr. Luciana Axe of Infectious Diseases as an outpatient, also will follow     up with his gastroenterologist Dr. Elnoria Howard.  The patient will follow     up with Interventional Radiology  on May 03, 2011, for possible     stent placement and internalization of his drain catheters. 2. Biliary pancreatitis, see problem #1. 3. Hypertension.  This remained stable during the hospitalization.     [name] benazepril dose was decreased to half to 20 mg daily.     [name] blood pressure remained in stable and improved     condition. 4. History of recent MI.  The patient was noted to have a recent MI in     April 2012, at which point in time a pacemaker was implanted  on     January 01, 2011, secondary to Mobitz II second degree AV block.  The     patient's Plavix was initially held prior to procedure and it was     resumed postprocedure.  The patient will be followed up as an     outpatient.  [name] Plavix was resumed and will be dictated     by Interventional Radiology when his stent is to be internalized. 5. Dementia/confusion, remained stable throughout the hospitalization     and the patient was in stable and improved condition. 6. Non-insulin-dependent diabetes mellitus. 7. Dyslipidemia. 8. Morbid obesity. 9. History of transient ischemic attacks. 10.Hearing impairment. 11.Gastroesophageal reflux disease as the rest of his secondary     diagnosis which remained stable throughout the hospitalization.     [name] Plavix and back to the history of recent MI on the     hospitalization.  The rest of the patient's chronic medical issues remained stable throughout the hospitalization and the patient will be discharged in stable and improved condition.  On the day of discharge vital signs, temperature 98.2, pulse of 64, respirations 18, blood pressure 113/73, satting 99% on room air.  DISCHARGE LABS:  CK of 17.  BMET, sodium 138, potassium 3.9, chloride 108, bicarb 19, glucose 122, BUN 12, creatinine 1.06, calcium of 9.4. CBC with a white count of 8.1, hemoglobin 12.6, hematocrit 37.8 and platelet count of 311.  Wound cultures did grow out Pseudomonas aeruginosa, vancomycin-resistant Enterococcus and Candida species.  LFTs from April 27, 2011, did had alkaline phosphatase of 121, bilirubin of 0.4, AST 15, ALT 16, protein 5.5, albumin 2.1 and a calcium of 8.7.  It has been a pleasure taking care of [name].     Ramiro Harvest, MD     DT/MEDQ  D:  04/29/2011  T:  04/29/2011  Job:  [id number]  cc:   Marjory Lies, M.D. Fax: [phone]  Gardiner Barefoot, MD  Jordan Hawks Elnoria Howard, MD Fax: [phone]  Art A. Hoss, M.D. [address] [place], [place] [address]  Electronically Signed by Ramiro Harvest MD on 05/31/2011 12:05:43 PM

## 2011-06-07 NOTE — Consult Note (Signed)
  NAME:  [name], [name]               ACCOUNT NO.:  [id number]  MEDICAL RECORD NO.:  [id number]  LOCATION:  4000                         FACILITY:  [facility]  PHYSICIAN:  Jeyda Siebel L. Ladona Ridgel, MD  DATE OF BIRTH:  [birth year]  DATE OF CONSULTATION:  05/22/2011 DATE OF DISCHARGE:                                CONSULTATION   REQUESTING PHYSICIAN:  Marianne L. York, nurse practitioner with the hospitalist.  REASON FOR CONSULTATION:  Medical treatment options.  This DNP, Dorian Pod, received report from the team, reviewed medical records, examined the patient, and then met with the patient's wife and spoke with the patient's daughter, [name] by phone, [name] is his spouse, her number [phone], her cell number is [phone] and [name], his daughter, participated by phone.  A detailed discussion was had regarding advance directives with concept specific to the difference between an aggressive medical interventions path versus a palliative comfort care path for this patient at this time in his current situation.  Time in 1300 and time out 1430 with a 90-minute consult. Greater than 50% of this time spent in medical counseling and coordination of care, discussion of the pathophysiology of the patient's disease process.  PROBLEM LIST:  At this time is as follows: 1. Altered mental status. 2. Failure to thrive. 3. Anorexia with a 10-pound weight loss in the last 3 weeks. 4. Albumin of 2.1. 5. Deconditioning. 6. AST of 315 and an ALT of 173.  Goals of care at this time is established by the patient's wife and daughter is: 1. To remain a full code. 2. Continue aggressive care. 3. Wife states that the patient has a living will that states he would     not be receptive to treatment in a persistent vegetative state only     but family not ready to minimize treatments at this time and the     patient is not in a persistent vegetative state.  They would like     to continue IV fluids, antibiotics,  labs, and treatments.  We     discussed the patient's baseline status with multiple medical     conditions and recurrent illness which may decrease his ability to     thrive or resists future illnesses and this would also affect his     ability to survive a code     situation.  Wife is very appreciative of the conversation and will     discuss further with her daughter.  I will continue to shadow the     chart for family support.  Thank you for the opportunity to assist with this patient's care.  I can be reached at [phone].     Dorian Pod, ACNP   ______________________________ Katharina Caper. Ladona Ridgel, MD    MB/MEDQ  D:  05/24/2011  T:  05/24/2011  Job:  [id number]  cc:   Marjory Lies, M.D.  Electronically Signed by Dorian Pod ACNP on 06/01/2011 05:11:32 PM Electronically Signed by Derenda Mis MD on 06/07/2011 02:37:18 PM

## 2011-06-11 NOTE — Discharge Summary (Signed)
NAME:  [name]               ACCOUNT NO.:  [id number]  MEDICAL RECORD NO.:  [id number]  LOCATION:  5527                         FACILITY:  [facility]  PHYSICIAN:  Thad Ranger, MD       DATE OF BIRTH:  02-Jan-1927  DATE OF ADMISSION:  05/02/2011 DATE OF DISCHARGE:                              DISCHARGE SUMMARY   PRIMARY CARE PHYSICIAN:  Marjory Lies, MD  DIAGNOSES: 1. Acute cholangitis originally diagnosed in July, cultures positive     for vancomycin-resistant enterococci and Pseudomonas. 2. Biliary dilatation and placement of biliary stent today May 25, 2011, removal of biliary drain May 25, 2011. 3. Acute renal failure, currently resolved. 4. Gastrointestinal bleed secondary to duodenal ulcer. 5. Acute blood loss anemia, status post transfusion while transfusion     on May 24, 2011. 6. Ischemic cardiomyopathy with myocardial infarction and stent     placement in April 2012. 7. History of second degree atrioventricular block, status post [employer] pacemaker placement. 8. Recurrent pancreatitis. 9. Hearing deficit. 10.Diabetes mellitus. 11.Dementia. 12.Hyperlipidemia. 13.Chronic obstructive pulmonary disease. 14.Hypertension. 15.Failure to thrive. 16.Acute encephalopathy on dementia, improved.  CONSULTATIONS: 1. Gastroenterology, Dr. Jeani Hawking. 2. Infectious Disease, Dr. Paulette Blanch Dam. 3. Nephrology, Dr. Arrie Aran. 4. Palliative Care, Dr. Derenda Mis and Dorian Pod nurse     practitioner. 5. Interventional radiologist, Dr. Neil Crouch.  HISTORY AND HOSPITAL COURSE:  Please also refer to the excellent progress notes dictated by Reche Dixon on May 22, 2011.  For details additionally: 1. Acute cholangitis with originally diagnosed in July.  Cultures     positive for VRE and serum.  Currently, the patient is doing well.     The patient has been doing well off the antibiotics.  He did     receive ciprofloxacin for the stent  placement today.  Otherwise,     the patient has not spiked any fevers or had any leukocytosis.  The     patient over the last 2 days started having voluminous biliary     drainage up approximately about 800 mL.  He was placed on IV fluids     as the patient had previously become quickly dehydrated with a     creatinine peaking at 10 in the previous admission.  The patient     was followed closely by Gastroenterology, Dr. Jeani Hawking.  The     patient underwent cholangiogram with biliary dilatation today on     May 25, 2011.  Biliary drain was removed and biliary stent was     placed.  Hopefully, the stent will resolve recurrent gallstone     pancreatitis. 2. Duodenal ulcers causing acute blood loss anemia.  Please refer to     the previously dictated progress note on May 22, 2011, for     complete details.  The patient was also in the interim transfused 1     unit of packed RBCs on May 24, 2011.  Aspirin and Plavix was     still on hold.  [name] hemoglobin is currently stable at 9.5     post transfusion.  He remained on b.i.d.  Protonix and sucralfate.     Aspirin is to be restarted on May 29, 2011, and GI needs to     place recommendations regarding the Plavix. 3. Delirium with bell palsy significantly improved.  The patient has a     baseline mild dementia and intermittent periods of confusion.     However, the patient's mental status is close to baseline at     present. 4. Acute renal failure likely secondary to medications including ACE     inhibitor, metformin, daptomycin, ciprofloxacin acute dehydration     from voluminous biliary drainage.  [name] creatinine has     improved with holding of the nephrotoxic medications and fluid with     the creatinine of 9.7 at the time of admission.  CODE STATUS:  The patient had a goals of care meeting with the Palliative Medicine in presence.  At this time, the patient remains a full code with aggressive care.   The patient will be discharged to Inpatient Rehab if bed available.  PHYSICAL EXAMINATION:  VITAL SIGNS:  At the time of dictation, temperature 98.1, pulse 73, respiration 19 and blood pressure 129/82. GENERAL:  The patient is alert and awake, not in any acute distress, hard-of-hearing. HEENT:  Atraumatic, normocephalic.  Pupils equal and reactive to light and accommodation.  EOMI. NECK:  Supple with trachea and no JVD. CHEST:  No increased work of breathing.  No wheezing, rales or rhonchi. CVS:  S1 and S2 clear. ABDOMEN:  Slightly distended percutaneous biliary drain removed today. EXTREMITIES:  No cyanosis or clubbing noted.  Trace edema noted. SKIN:  No rashes or bruises.  CURRENT MEDICATIONS: 1. Amlodipine 10 mg daily. 2. Aspirin 81 mg daily to be started on May 29, 2011. 3. Coreg. 4. Colace 200 mg b.i.d. 5. Ferrous sulfate 325 mg p.o. t.i.d. 6. Ensure p.o. t.i.d. 7. Folic acid 1 mg p.o. daily. 8. NovoLog aspart sliding scale q.a.c. and at bedtime. 9. Protonix 40 mg p.o. q.12 h. 10.Protein supplement 30 mL p.o. daily. 11.Carafate 1 g p.o. q.i.d. 12.Zofran 4 mg IV q.6 h. p.r.n. 13.MiraLax 17 g p.o. p.r.n. 14.Nitroglycerin 0.4 mg sublingual p.r.n. 15.Please note that the benazepril have been discontinued.  The patient will be discharged to Inpatient Rehab today.  He will have follow up with the his gastroenterologist, Dr. Jeani Hawking.  The patient will be followed by Interventional Radiology as needed.  DISCHARGE TIME:  45 minutes.     Thad Ranger, MD     RR/MEDQ  D:  05/25/2011  T:  05/25/2011  Job:  [id number]  cc:   Jordan Hawks. Elnoria Howard, MD Dorian Pod, ACNP Melissa L. Ladona Ridgel, MD  Electronically Signed by Andres Labrum RAI  on 06/11/2011 03:41:57 PM

## 2011-06-13 ENCOUNTER — Telehealth: Payer: Self-pay | Admitting: Internal Medicine

## 2011-06-13 NOTE — Telephone Encounter (Signed)
Per [name] from OT  therapy. Requesting clearance for strength ing of the left shoulder by pacer site.

## 2011-06-13 NOTE — Telephone Encounter (Signed)
This is Dr Elvis Coil patient  Do you want to send to him?

## 2011-06-14 ENCOUNTER — Encounter: Payer: Self-pay | Admitting: Cardiology

## 2011-06-14 NOTE — Discharge Summary (Signed)
NAME:  [name], [name]               ACCOUNT NO.:  [id number]  MEDICAL RECORD NO.:  [id number]  LOCATION:                                 FACILITY:  PHYSICIAN:  Ranelle Oyster, M.D.DATE OF BIRTH:  23-Aug-1927  DATE OF ADMISSION:  05/25/2011 DATE OF DISCHARGE:  05/31/2011                              DISCHARGE SUMMARY   DISCHARGE DIAGNOSES: 1. Deconditioning - cholangitis and acute renal failure. 2. Acute blood loss anemia. 3. Gastrointestinal bleed. 4. Diabetes mellitus type 2. 5. Cardiomyopathy. 6. Hypokalemia.  HISTORY OF PRESENT ILLNESS:  [name] is a 77 year old male with history of dementia, recent admission July 26 for acute cholangitis with biliary pancreatitis requiring placement of percutaneous biliary drain. He was discharged to home on August 5.  He, however, readmitted August 8 with decreased p.o. intake and decrease in biliary drain output.  He was noted to have acute renal failure to admission with creatinine at 9.7. Renal ultrasound negative and Nephrology felt the patient with acute renal insufficiency secondary to poor p.o. intake and gentle IV fluids recommended for hydration.  Cultures from biliary drain was positive for VRE and Pseudomonas.  The patient was placed on IV Fortaz and Zyvox per ID for 10 total days of antibiotic therapy.  The patient has had bouts of delirium and Neurology was consulted for input.  CT of head done was negative for acute changes.  Multifactorial delirium is currently resolving and the patient is back to baseline.  The patient was noted to develop anemia despite transfusion.  EGD done revealed duodenal ulcer and this was treated with BICAP and the patient was placed on Carafate and Protonix for treatment.  Recommendations are to monitor H and H and if this continued to drop, he may need to repeat EGD.  H and H from today is at 9.5 and 27.8.  Also recommendations to resume aspirin if hemoglobin stable and above 9.0.  Therapies  were initiated and currently the patient is noted to be deconditioned.  The patient was evaluated by rehab and we felt that he would benefit from inpatient rehab stay.  PAST MEDICAL HISTORY:  Significant for dementia, ischemic cardiomyopathy with ejection fraction 35% and permanent pacemaker, history of right Bell's palsy, cholecystectomy, ankle surgery, diabetes mellitus, dyslipidemia, questionable history of COPD, history of TIA.  ALLERGIES:  PENICILLIN, STATINS, and DOXYCYCLINE.  FAMILY HISTORY:  Positive for diabetes mellitus.  SOCIAL HISTORY:  The patient is married, lives with wife in 1-level home with 2 steps at entry.  Wife is very supportive.  Does not use any tobacco or alcohol.  FUNCTIONAL HISTORY:  The patient was independent prior to August 26. Required some assistance with dressing.  FUNCTIONAL STATUS:  The patient is min to mod assist bed mobility, min assist transfers, min assist ambulating 80 feet with rolling walker.  He requires min to mod assist for ADLs.  PHYSICAL EXAMINATION:  VITALS:  Blood pressure 120/84, pulse 84, respiratory rate 18, temperature 98.3. GENERAL:  The patient is elderly male well-nourished, well-developed, hard-of-hearing. HEENT:  Pupils equal, round, reactive to light.  Oral mucosa is moist. Hearing decreased. LUNGS:  Clear to auscultation bilaterally without wheezes, rales, or rhonchi. HEART:  Shows regular rate and rhythm without murmurs, gallops, or rubs. ABDOMEN:  Soft and nontender with positive bowel sounds.  Prior area of drain site is closed and healing well. EXTREMITIES:  Show dry skin, bilateral lower extremities, stasis changes left shin.  A 1+ pedal edema bilaterally. NEUROLOGIC:  The patient with right facial weakness and dysarthric speech.  Reflexes are 1+.  Sensation grossly intact in all 4 except for distal lower extremity.  Judgment, orientation, memory, and mood are fair to poor.  Exam is compromised somewhat due to  his decreased hearing.  He is able to follow simple commands and carry conversation. Strength is 4/5 in upper extremity proximal and distal.  Lower extremity strength is grossly 2-3/5 proximal, 4/5 distally.  HOSPITAL COURSE:  Mr. [name] was admitted to rehab on May 25, 2011 for inpatient therapies to consist of PT, OT at least 3 hours 5 days a week.  Past admission physiatrist rehab, RN, and therapy team have worked together to provide customized collaborative interdisciplinary care.  Rehab RN has worked with the patient on bowel and bladder program as well as close monitoring of p.o. intake.  Weights were monitored during this stay.  These have been variable, however, currently the patient is at 76.3 kg.  [name] blood pressures have been checked on b.i.d. basis.  These have been controlled ranging from 110-130 systolic, 70s-80s diastolic.  Heart rate stable in 70s-80s range.  [name] p.o. intake has been good.  He has been continent of bowel and bladder.  Stool guaiacs have been done x2 and these have been negative.  CBC of September 2004 showed some drop with H and H at 8.6 and 25.1.  Therefore his aspirin was held.  Recheck from September 5 is stable at 8.8 and 26.2.  Currently aspirin continues to be on hold and the patient to follow up with primary MD and GI next week regarding resumption of aspirin if H and H remained stable and no further drop noted.  Recent check of lytes shows some mild hypokalemia with sodium 139, potassium 3.4, chloride 108, CO2 28, BUN 13, creatinine 0.92, glucose 103.  The patient was treated with potassium supplements x1. Check of LFTs revealed a phos at 282, SGOT 9, SGPT 16, total protein 4.5, albumin 2.0.  [name] blood sugars have been checked on a.c. and h.s. basis. The patient is on carb-modified diet and blood sugars are reasonable ranging from 100-140s.  His metformin continues to be on hold.  The patient has been  advised to continue carb-modified diet for now.  Follow up with Dr. Marjory Lies regarding resumption of metformin in the future.  During the patient's stay in rehab, weekly team conferences were held to monitor the patient's progress, set goals as well as discuss barriers to discharge.  At the time of admission, the patient was noted to be deconditioned with significant truncal and bilateral lower body weakness, decrease in emergent and safety awareness, delayed balance reaction with mild impulsivity and significant activity tolerance.  The patient was at min assist for mobility with easy fatigability.  Physical Therapy has worked with the patient on mobility and strengthening. Currently the patient has made good progress and is at supervision level overall.  He does require min assist for navigation of stairs.  The patient and wife have been educated regarding assistance needed with mobility.  Also it is recommended that patient use rolling walker for safety due to decreased balance.  Wife has completed education regarding  transfers to gait and safety with mobility.  Further followup home health, physical therapy to continue past discharge.  OT has worked with the patient on self-care tasks.  At admission, the patient was limited due to his decreased attention, decreased awareness, decreased safety. He has made good progress towards his goals and is at supervision level overall for bathing and dressing.  He continues to show poor safety awareness during this task with increased impulsive behaviors.  Wife has been educated regarding providing assistance and further followup home health occupational therapy to continue past discharge.  On May 31, 2011, the patient is discharged to home.  DISCHARGE MEDICATIONS: 1. Amlodipine 10 mg p.o. per day. 2. Coreg 25 mg p.o. b.i.d. 3. Colace 100 mg p.o. b.i.d. 4. Ensure t.i.d. 5. Eucerin cream to lower extremities b.i.d. 6. Ferrous  sulfate 325 mg t.i.d. 7. Folic acid 1 mg p.o. per day. 8. MiraLax 7 grams in 8 ounces per day. 9. Carafate 1 gram p.o. q.i.d. 10.Centrum Silver 1 p.o. per day. 11.Mag sulfate 500 mg p.o. per day. 12.Nitroglycerin p.r.n. chest pain. 13.Omeprazole 20 mg p.o. b.i.d. 14.Toviaz 4 mg p.o. per day. 15.Zetia 10 mg p.o. per day.  DIET:  Diabetic diet with low salt restrictions.  ACTIVITY LEVEL:  A 24-hour supervision.  No strenuous activity.  Use walker for ambulation at supervision with ambulation.  SPECIAL INSTRUCTIONS:  Do not use metformin, Aricept, aspirin, Plavix, Cipro, benazepril, Zocor, daptomycin.  FOLLOWUP:  To follow up with Dr. Elnoria Howard and Dr. Caryl Never next Tuesday regarding resumption of aspirin if hemoglobin stable.  The patient to follow up with Dr. Riley Kill as needed.  Follow up with Dr. Marjory Lies in 2 weeks.  Follow up with Dr. Elnoria Howard towards the end of the month.     Delle Reining, P.A.   ______________________________ Ranelle Oyster, M.D.    PL/MEDQ  D:  05/31/2011  T:  05/31/2011  Job:  [id number]  cc:   Marjory Lies, M.D. Jordan Hawks Elnoria Howard, MD Peter M. [place], M.D.  Electronically Signed by Osvaldo Shipper. on 06/06/2011 04:34:45 PM Electronically Signed by Faith Rogue M.D. on 06/14/2011 09:57:40 AM

## 2011-06-14 NOTE — H&P (Signed)
[name], [name]               ACCOUNT NO.:  [id number]  MEDICAL RECORD NO.:  [id number]  LOCATION:  4005                         FACILITY:  [facility]  PHYSICIAN:  Ranelle Oyster, M.D.DATE OF BIRTH:  Sep 02, 1927  DATE OF ADMISSION:  05/25/2011 DATE OF DISCHARGE:                             HISTORY & PHYSICAL   CHIEF COMPLAINTS:  Weakness and deconditioning as well as confusion.  PRIMARY CARE PHYSICIAN:  Marjory Lies, MD  HISTORY OF PRESENT ILLNESS:  This is an 77 year old white male with mild dementia who was recently admitted on July 26 for acute cholangitis and biliary pancreatitis, placement of a perc biliary drain and was later discharge to home.  He was readmitted on August 8 with poor intake and decrease drain outpatient.  He was noted to be in acute renal failure. His creatinine 9.7 and renal ultrasound is negative.  He was placed on gentle IV fluids with the creatinine improving to 1.58.  Dialysis was never needed.  Cultures were obtained from biliary drain were positive VRE and Pseudomonas.  He was placed on IV Fortaz and Zyvox for 10 days to be which was completed on August 25.  He had 1000 delirium as well. Neurology recommended CT which was negative for acute changes.  He is felt to have multifactorial confusion and the patient is showing improvement to his baseline.  He is hard of hearing as well.  He developed anemia at 7.2.  He was transfused 1 unit of packed red blood cells on August 22 with increased hemoglobin of 7.8 and this was monitored.  Anemia panel was unremarkable except for mild folate deficiency.  He was placed on folic acid.  Biliary drain was semicath per IR and also ultimately was full today.  The patient is full code. EGD on August 24 showed a duodenal ulcer and he was placed on Carafate and Protonix.  H and H is to be monitored.  If continued drop is scene, he may need repeat EGD.  Rehab has been following along with this patient since August  21 and thought he could benefit from inpatient stay.  REVIEW OF SYSTEMS:  Notable for incontinence, weakness, decreased appetite, confusion, decreased hearing.  Full 12-point review is in the written H and P.  PAST MEDICAL HISTORY:  Positive for dementia, ischemic cardiomyopathy, ejection fraction of 35%, permanent pacemaker, diabetes, hyperlipidemia, COPD, history of TIA, right Bell palsy, cholecystectomy and ankle surgery.  He denies alcohol and tobacco.  FAMILY HISTORY:  Positive for diabetes and negative for CAD.  SOCIAL HISTORY:  The patient lives with his wife who can assist as needed.  She was present during our exam today.  They have one level house with two steps to enter.  ALLERGIES:  PENICILLIN, STATINS and DOXYCYCLINE.  MEDS AND LABS:  Please see written H and P.  Most recent hemoglobin 9.5, platelets 170.  Electrolytes were completely normal.  PHYSICAL EXAM:  Blood pressure is 120/84, pulse is 84, respiratory rate is 18, he is satting 94% on room air.  Temperature is 98.3.  The patient is generally pleasant.  Pupils are equal, round, and reactive to light. Ear, nose and throat exam notable for poor hearing.  Neck is supple without JVD or lymphadenopathy.  Chest is clear to auscultation bilaterally without wheezes, rales or rhonchi.  Heart is regular rate and rhythm without murmur, rubs or gallop.  Extremities showed no clubbing, cyanosis, only trace edema in both lower extremities.  Abdomen was soft and appropriately tender.  Bowel sounds were positive.  Skin was notable for dryness over the bilateral lower extremities, some chronic stasis changes.  His right upper quadrant drain site was covered with a pressure dressing.  Neurologically cranial nerves II-XII notable for mild right facial weakness and dysarthria as well as decreased hearing.  Reflexes are 1+.  Sensation is grossly intact in all 4 except for the distal lower extremities.  Judgment, orientation, memory  and mood are fair to poor.  His exam is somewhat compromised by his hearing. He has had problems with orientation, simple problem-solving etc.  He was able to follow simple commands however and carry conversation. Strength is 4/5 in upper extremities proximal and distal, lower extremity strength is grossly 2-3/5 proximal to 4/5 distally.  POST ADMISSION PHYSICIAN EVALUATION: 1. Functional deficit secondary to deconditioning related to acute     renal failure, cholangitis and delirium.  The patient also with a     mild peripheral neuropathy. 2. The patient is admitted to receive collaborative interdisciplinary     care between the physiatrist, rehab nursing staff and therapy team. 3. [name] level of medical complexity and substantial therapy     needs in context of that medical necessity cannot be provided at     lesser intensity of care. 4. The patient has experienced substantial functional loss from his     baseline.  Premorbidly he was independent and was driving prior to     July 26.  Currently the patient is mod assist bed mobility, min to     mod assist transfer, min assist gait 80 feet rolling walker, min to     mod assist ADLs.  Judging by the patient's diagnosis, physical exam     and functional history has potential functional progress which will     result in measurable gains while inpatient rehab.  Gains will be of     substantial and practical use upon discharge to home in     facilitating mobility and self-care.  Interim changes in his status     since our rehab consult are detailed above. 5. The physiatrist will provide 24-hour management of medical needs as     well as oversight of the therapy plan/treatment and provide     guidance as appropriate regarding interaction of the 2.  Medical     problem list and plan are below. 6. A 24-hour rehab nursing team will assist in the management of the     patient's skin care needs as well as nutrition, bowel and bladder      function, sleep, hygiene and integration of therapy concepts and     techniques. 7. PT will assess and treat for lower extremity strength, range of     motion, functional mobility, safety, balance, family inpatient     education with goals supervision to modified independent. 8. OT will assess and treat for upper extremity use, ADLs, adaptive     techniques, equipment, functional mobility, safety, family and     patient education with goals modified independent to min assist. 9. Speech language pathology will follow for speech quality and     cognition with goals modified independent to supervision. 10.Case management  and social worker will assess and treat for     psychosocial issues and discharge planning. 11.Team conference will be held weekly to assess progress towards     goals and to determine barriers at discharge. 12.The patient has demonstrated sufficient medical stability and     exercise capacity to tolerate at least 3 hours therapy per day at     least 5 days per week. 13.Estimated length of stay is 2 weeks.  Prognosis is good.  The     patient and his wife seem fairly motivated.  MEDICAL PROBLEM LIST AND PLAN: 1. DVT prophylaxis with SCDs and thigh-high TED hose.  Blood thinners     are contraindicated due to his GI bleeding. 2. Pain management with p.r.n. Tylenol.  Try to limit any neuro     sedating medications as possible given his cognitive history. 3. Cognition:  The patient is on Aricept prior to arrival.  I would     hold off on this given his GI history.  This is not the vital by     any means. 4. Acute renal failure:  This has responded to IV fluids and better     nutrition.  We will check renal function serially while in the     rehab floor and check I's and O's daily. 5. VRE/Pseudomonas infection:  Fortaz and Zyvox have been completed. 6. Anemia:  We will check CBC serially.  The patient has been     transfused already and may need further transfusion as we  move     forward.  If bleeding becomes worsened, we will need to consider     followup GI evaluation. 7. Blood pressure control:  Coreg.  Monitor for heart rate and     tolerance of activities. 8. Type 2 diabetes:  Sliding-scale insulin.  He is on board.  He was     on metformin 500 mg b.i.d. prior to arrival.  Given a sporadic p.o.     intake, we will need to be conservative with addition of scheduled     medications. 9. Delirium:  This is likely exacerbated wise baseline dementia.  The     patient is showing improvement close to the baseline but is not     there as of yet.  Hearing problems exacerbate this picture.  I will     hold off on Aricept for the reasons mentioned above. 10.Ischemic cardiomyopathy:  Recommendation is to start restart     aspirin on September 4.  We will consider doing so based on GI, his     pathology and blood counts.     Ranelle Oyster, M.D.     ZTS/MEDQ  D:  05/25/2011  T:  05/25/2011  Job:  [id number]  cc:   Marjory Lies, M.D.  Electronically Signed by Faith Rogue M.D. on 06/14/2011 09:58:16 AM

## 2011-06-16 NOTE — Telephone Encounter (Signed)
Given the this is strengthening of the shoulder near the device, I think the referral from me is appropriate.  Please arrange OT as requested.

## 2011-06-17 NOTE — Consult Note (Signed)
NAME:  [name], [name] NO.:  [id number]  MEDICAL RECORD NO.:  [id number]  LOCATION:  5527                         FACILITY:  [facility]  PHYSICIAN:  Acey Lav, MD  DATE OF BIRTH:  [birth year]  DATE OF CONSULTATION:  05/03/2011 DATE OF DISCHARGE:                                CONSULTATION   REQUESTING PHYSICIAN:  Dr. Cleotis Lema.  REASON FOR INFECTIOUS DISEASE CONSULTATION:  The patient with recent cholangitis, now with renal failure in the setting of antibiotics that were given for his cholangitis.  DISCUSSION:  For details please see the handwritten note and paper chart written by my first year medical resident, Dr. Lottie Rater.  I have examined the laboratory data, radiographic data, examined the paper electronic medical record including history present illness, past medical history, surgical history, family history, social history, allergies, medications, and pertinent review of systems.  I agree with assessment and plan as outlined in his note with the following addendum:  Briefly, this is an 77 year old Caucasian male who was admitted to [facility] with gallstone pancreatitis, also with concern for cholangitis, who underwent placement of a biliary drain.  The drain was cultured and grew vancomycin-resistant enterococcus as well as Pseudomonas aeruginosa.  The patient had been placed on imipenem initially and then was changed over to daptomycin, ciprofloxacin, and fluconazole due to isolation of Pseudomonas enterococcus and some yeast. He was discharged to home on August 5, at which point in time his renal function was normal.  He went home and unfortunately was not eating much.  He was continued take metformin which he takes for his diabetes. He then was readmitted on August 8, with acute renal failure.  His creatinine had risen up to 80, his BUN was over 64.  The admitting physician as well as the nephrology team were concerned about prerenal failure due  to poor oral intake, concomitant ACE inhibitor and also concerned about metformin and doxycycline also ultimately concerned about possibility of antibiotic-induced nephrotoxicity.  Of note, the patient did not have evidence of rhabdomyolysis by urinalysis and CPK was normal.  This is certainly something he could have with daptomycin, but it is clearly not helping in this situation.  In any case, his daptomycin and ciprofloxacin were stopped, although his fluconazole was continued.  We were asked to assist the management and workup this patient with recent cholangitis with Candida VRE and Pseudomonas isolated from gallbladder drain.  Cholangitis:  I am skeptical the antibiotics caused his acute renal event, although it is not clear to me what caused to this, he has a history that suggestive of prerenal failure, although his urine sodium checked today once he was able to produce urine, did have volume above 100.  Making this seems less likely.  In any case, we will go head and change his antibiotics.  I will put him on ceftazidime to cover his Pseudomonas as well as Zyvox to cover his VRE.  We can continue the fluconazole for coverage of his candida unless this is a potential offending agent in his acute renal failure.  I would also stop his omeprazole as well as I have seen interstitial nephritis with omeprazole use as well.  I would finish out a course of 10 days of antibiotics from when he was initially started on anti-VRE medicine which was on the second, this would allow him to finish antibiotics on August 12.  Thank you for Infectious Disease consultation.  Please call with further questions.     Acey Lav, MD     CV/MEDQ  D:  05/03/2011  T:  05/04/2011  Job:  [id number]  cc:   Terrial Rhodes, M.D.  Electronically Signed by Paulette Blanch DAM MD on 06/17/2011 10:02:31 PM

## 2011-06-18 NOTE — Telephone Encounter (Signed)
Ok to do per Dr Gretta Arab aware

## 2011-06-19 LAB — BASIC METABOLIC PANEL
CO2: 24
Chloride: 100
GFR calc Af Amer: 57 — ABNORMAL LOW
GFR calc non Af Amer: 47 — ABNORMAL LOW
Glucose, Bld: 188 — ABNORMAL HIGH
Potassium: 3.9
Potassium: 4.4
Sodium: 135
Sodium: 135

## 2011-06-19 LAB — COMPREHENSIVE METABOLIC PANEL
ALT: 16
Alkaline Phosphatase: 65
CO2: 26
GFR calc non Af Amer: 47 — ABNORMAL LOW
Glucose, Bld: 151 — ABNORMAL HIGH
Potassium: 4.2
Sodium: 126 — ABNORMAL LOW
Total Protein: 5.8 — ABNORMAL LOW

## 2011-06-19 LAB — BLOOD GAS, ARTERIAL
Acid-base deficit: 1.8
Bicarbonate: 21.6
O2 Saturation: 94.7
pCO2 arterial: 31.3 — ABNORMAL LOW
pO2, Arterial: 74.8 — ABNORMAL LOW

## 2011-06-19 LAB — CBC
HCT: 40.2
Hemoglobin: 13.5
Hemoglobin: 13.7
Hemoglobin: 14.7
MCHC: 34
MCV: 93.3
RBC: 4.28
RBC: 4.32
RBC: 4.67
WBC: 8.9

## 2011-06-19 LAB — DIFFERENTIAL
Basophils Relative: 0
Eosinophils Absolute: 0
Eosinophils Relative: 0
Monocytes Relative: 6
Neutrophils Relative %: 84 — ABNORMAL HIGH

## 2011-06-19 LAB — URINALYSIS, ROUTINE W REFLEX MICROSCOPIC
Bilirubin Urine: NEGATIVE
Nitrite: NEGATIVE
Specific Gravity, Urine: 1.019
pH: 6

## 2011-06-19 LAB — LIPASE, BLOOD: Lipase: 19

## 2011-07-11 ENCOUNTER — Ambulatory Visit (INDEPENDENT_AMBULATORY_CARE_PROVIDER_SITE_OTHER): Payer: No Typology Code available for payment source | Admitting: Cardiology

## 2011-07-11 ENCOUNTER — Encounter: Payer: Self-pay | Admitting: Cardiology

## 2011-07-11 VITALS — BP 104/66 | HR 77 | Ht 66.0 in | Wt 175.8 lb

## 2011-07-11 DIAGNOSIS — E785 Hyperlipidemia, unspecified: Secondary | ICD-10-CM

## 2011-07-11 DIAGNOSIS — I2510 Atherosclerotic heart disease of native coronary artery without angina pectoris: Secondary | ICD-10-CM

## 2011-07-11 DIAGNOSIS — I2109 ST elevation (STEMI) myocardial infarction involving other coronary artery of anterior wall: Secondary | ICD-10-CM

## 2011-07-11 DIAGNOSIS — I5022 Chronic systolic (congestive) heart failure: Secondary | ICD-10-CM | POA: Insufficient documentation

## 2011-07-11 DIAGNOSIS — I252 Old myocardial infarction: Secondary | ICD-10-CM

## 2011-07-11 DIAGNOSIS — I509 Heart failure, unspecified: Secondary | ICD-10-CM

## 2011-07-11 DIAGNOSIS — I441 Atrioventricular block, second degree: Secondary | ICD-10-CM

## 2011-07-11 NOTE — Assessment & Plan Note (Signed)
He is status post pacemaker implant with good pacemaker function.

## 2011-07-11 NOTE — Assessment & Plan Note (Signed)
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 Given his gastrointestinal bleed in August his antiplatelet therapy was held. Since he has no evidence of recurrent bleeding I would recommend resuming Plavix 75 mg daily. He will continue to hold his aspirin. He is currently without angina. We will continue with his amlodipine and carvedilol.

## 2011-07-11 NOTE — Assessment & Plan Note (Signed)
Since his acute biliary issues have resolved I would recommend resuming him on statin therapy. He may take simvastatin 40 mg every other day or 20 mg daily.

## 2011-07-11 NOTE — Progress Notes (Signed)
[name] Date of Birth: 1926-11-12   History of Present Illness: [name] is seen for followup status post acute anterior myocardial infarction in April of this year treated with direct stenting of the mid LAD. He also had second-degree heart block with significant bradycardia that was symptomatic and he underwent a permanent pacemaker. His pacemaker followup was unremarkable. Since his last visit here he was hospitalized in August for 30 days. He had cholangitis and underwent a percutaneous biliary drain followed by stenting. He developed acute renal failure and also had a gastrointestinal bleed secondary to duodenal ulcers. His ACE inhibitor, aspirin, Plavix, and Zocor were all held. He reports that he had significant edema with his illness but this has improved significantly. He currently denies any significant shortness of breath or chest pain. He's had no palpitations. He has lost 12 pounds since June. He does note that his stools are still black but he is on iron therapy. He reports that his GI followup was good. He did have recent blood work by his primary care.  Current Outpatient Prescriptions on File Prior to Visit  Medication Sig Dispense Refill  . acetaminophen (TYLENOL) 325 MG tablet Take 650 mg by mouth as needed.        . carvedilol (COREG) 12.5 MG tablet TAKE 1 TABLET BY MOUTH TWICE A DAY WITH MEALS  60 tablet  5  . donepezil (ARICEPT) 5 MG tablet Take 5 mg by mouth at bedtime.        Marland Kitchen ezetimibe (ZETIA) 10 MG tablet Take 10 mg by mouth daily.        . fesoterodine (TOVIAZ) 4 MG TB24 Take 4 mg by mouth daily.        . fluticasone (FLONASE) 50 MCG/ACT nasal spray Place 2 sprays into the nose daily.        . hydrochlorothiazide 25 MG tablet Take 12.5 mg by mouth daily.       . Magnesium 500 MG TABS Take 1 tablet by mouth daily.        . magnesium oxide (MAG-OX) 400 MG tablet Take 400 mg by mouth daily.        . Multiple Vitamins-Minerals (CENTRUM SILVER PO) Take 1 tablet  by mouth daily.       . nitroGLYCERIN (NITROSTAT) 0.4 MG SL tablet Place 0.4 mg under the tongue every 5 (five) minutes as needed.        Marland Kitchen omeprazole (PRILOSEC) 20 MG capsule Take 20 mg by mouth 2 (two) times daily.        . polyethylene glycol powder (GLYCOLAX/MIRALAX) powder Take 17 g by mouth at bedtime.          Allergies  Allergen Reactions  . Doxycycline   . Statins   . Penicillins Rash    Past Medical History  Diagnosis Date  . Chest discomfort   . Coronary artery disease   . Diabetes mellitus   . Hypertension   . Hyperlipidemia   . Obesities, morbid   . Gallstone pancreatitis     recurrent  . TIA (transient ischemic attack)   . Bacteremia due to vancomycin resistant Enterococcus   . GERD (gastroesophageal reflux disease)   . Hearing impairment   . Myocardial infarction, anterior wall, subsequent care   . Second degree AV block, Mobitz type II     s/p PPM by [facility] 4/12  . ARF (acute renal failure)   . Cholangitis   . Duodenal ulcer   . GI bleed   .  Anemia associated with acute blood loss     Past Surgical History  Procedure Date  . Cholecystectomy   . Pacemaker placement 01/01/11    implanted by [facility] for mobitz II AV block  . Pancreatic stent     History  Smoking status  . Former Smoker -- 1.0 packs/day for 57 years  . Types: Cigarettes  . Quit date: 01/12/1996  Smokeless tobacco  . Never Used    History  Alcohol Use No    History reviewed. No pertinent family history.  Review of Systems: The review of systems is positive for generalized fatigue. He is hard of hearing. He has mild edema. All other systems were reviewed and are negative.  Physical Exam: BP 104/66  Pulse 77  Ht 5\' 6"  (1.676 m)  Wt 175 lb 12.8 oz (79.742 kg)  BMI 28.37 kg/m2 He is an elderly overweight white male who is very hard of hearing. He is in no distress. HEENT exam reveals he is wearing hearing aid. His pupils are equal round and reactive. Sclera are clear. Oropharynx is  clear with poor dentition. Neck is without JVD, adenopathy, or bruits. Lungs are clear. Cardiac exam reveals a regular rate and rhythm without gallop, murmur, or click. His pacemaker site has healed completely. Abdomen is soft and nontender without masses or hepatosplenomegaly. Femoral and pedal pulses are 2+ and symmetric. He has 1+ edema. Other than his hearing losses cranial nerves II through XII are intact. He has no focal findings.  LABORATORY DATA:   Assessment / Plan:

## 2011-07-11 NOTE — Assessment & Plan Note (Signed)
Following his myocardial infarction he had moderate left ventricular dysfunction. If his renal function has returned to baseline I would like to resume his ACE inhibitor. We will obtain a copy of his most recent lab work. I will plan a followup again in 3 months.

## 2011-07-11 NOTE — Patient Instructions (Signed)
Resume Plavix 75 mg daily.  Resume simvastatin 20 mg daily.  We will get a copy of his lab work from Dr. Doristine Counter.  Stay off ASA.  I will see you back in 3 months.

## 2011-07-12 ENCOUNTER — Telehealth: Payer: Self-pay | Admitting: *Deleted

## 2011-07-12 NOTE — Telephone Encounter (Signed)
Spoke w/wife. Received lab from [facility] that showed renal function back to normal. Per Dr. [place] advised to resume taking Benazepril 10 mg daily. She verbalizes understanding.

## 2011-07-20 ENCOUNTER — Encounter: Payer: Self-pay | Admitting: Cardiology

## 2011-07-22 NOTE — Progress Notes (Signed)
NAME:  [name], [name]               ACCOUNT NO.:  [id number]  MEDICAL RECORD NO.:  [id number]  LOCATION:  4000                         FACILITY:  [facility]  PHYSICIAN:  Pleas Koch, MD        DATE OF BIRTH:  [birth year]                                PROGRESS NOTE   PRIMARY CARE PHYSICIAN: Marjory Lies, MD  WORKING DIAGNOSES: 1. Acute cholangitis, originally diagnosed in July.  Biliary drain Cultures positive     for vancomycin-resistant enterococci and Pseudomonas. 2. Acute renal failure, currently resolved. 3. Gastrointestinal bleeding secondary to duodenal ulcers. 4. Ischemic cardiomyopathy with an myocardial infarction and stent     placement in April 2012. 5. History of second-degree atrioventricular block status post [employer] pacemaker placement. 6. Recurrent pancreatitis. 7. Deafness. 8. Diabetes mellitus. 9. Dementia. 10.Hyperlipidemia. 11.Chronic obstructive pulmonary disease. 12.Hypertension. 13.Failure to thrive.  CONSULTATIONS: 1. Infectious Disease, Dr. Paulette Blanch Dam. 2. Nephrology, Dr. Terrial Rhodes. 3. Gastroenterology, Dr. Jeani Hawking. 4. Palliative Care, Dorian Pod, nurse practitioner. 5. Interventional radiologist, Dr. Lenoria Farrier Hoss and Dr. Irish Lack.  HISTORY AND HOSPITAL COURSE TO DATE: 1. Acute cholangitis.  This was originally diagnosed at [facility] in July.  The patient had a percutaneous drain placed     because of recurrent pancreatitis.  Cultures of the drainage     produced vancomycin-resistant Enterococcus as well as Pseudomonas.     Originally, the patient was treated with ciprofloxacin and     daptomycin.  He went home for 3 days between April 29, 2011 and was     readmitted on May 02, 2011 in acute renal failure.  Because of     his acute renal failure, the antibiotics were changed to     ceftazidime and Zyvox by Dr. Paulette Blanch Dam.  He received     ceftazidime between August 9 and May 06, 2011 and  Zyvox between     August 9 and May 07, 2011.  Antibiotics were discontinued as the     patient had had a full course of treatment.  However, within 48     hours, the patient became lethargic and developed delirium.     Antibiotics were restarted as the patient was transferred to the     Step-Down Unit and he improved.  The patient therefore received     Zyvox from August 15 until May 19, 2011 when it was again     discontinued.  Ceftazidime was also restarted on May 09, 2011     and it was discontinued on May 17, 2011.  Thus far, the     patient's white blood cell count is not elevated and as of today he     has been off antibiotics for 3 days.  He has also not spiked any     temperatures.  However, this morning, the patient's LFTs were elevated.  Alkaline phosphatase jumped from 104 on August 27 to 691 on May 22, 2011. Likewise, his AST was 315 and his ALT was 173, a tremendous increased from yesterday.  Consequently, doctors Elnoria Howard [place], and [place] were contacted.  After some  discussion, it was decided that the patient's biliary drain should be changed.  In the process of changing his drain, the previous drain was found to be partially occluded.  At this point, the drain has been changed.  The patient appears to be doing well.  Stat LFTs and blood cultures are being rechecked.  Dr. Elnoria Howard is planning to remove the biliary drain and replace it with a metal stent on Friday, May 25, 2011.  Dr. Daiva Eves will see the patient later this evening to determine if further workup (CT imaging) or further antibiotics are necessary. 2. Duodenal ulcerations causing acute blood loss anemia.  On     approximately May 16, 2011, the patient developed dark stools.     On May 17, 2011, it was noticed that his hemoglobin had dropped     and Dr. Elnoria Howard was reconsulted on May 18, 2011 and upper endoscopy     was performed by Dr. Jeani Hawking and multiple duodenal ulcers were      found.  One ulceration had a visible vessel.  Dr. Elnoria Howard performed     BICAP on the ulceration and the patient is now on b.i.d. oral     Protonix and q.i.d. sucralfate.  He has had no further bleeding.     He has been transfused 1 unit of packed red blood cells.  Aspirin     and Plavix are still currently being held.  Aspirin is to be     restarted on May 29, 2011 and Plavix is to be restarted at a     slightly later date.  [name] hemoglobin is currently stable     at approximately 8.3-8.6. 3. Delirium with an exacerbation of Bell's palsy - now resolved.     Between August 13 and May 09, 2011, the patient developed     lethargy and acute delirium.  It is noteworthy that this started     when his antibiotics were discontinued.  We were unable to perform     an MRI as the patient has a permanent pacemaker.  CT head was     negative.  Cardiac enzymes were negative.  Ammonia level was within     normal limits.  Neurology was called and the patient was seen by     Dr. Thana Farr who felt that this is likely an exacerbation of     Bell's palsy in association with acute delirium and the patient     during this time was transferred to the Step-Down Unit and after     his antibiotics were reinitiated, his mental status improved     significantly.  The patient has baseline mild dementia and had     intermittent periods of confusion, but he is normally alert and     oriented to person. 4. Acute renal failure.  The patient was discharged from [facility] on April 29, 2011.  He returned to the [facility]     on May 02, 2011.  His creatinine rose to a peak of 10.  It was     felt that his acute renal failure was secondary to both nephrotoxic     medications including an ACE inhibitor, metformin, daptomycin, and     ciprofloxacin, as well as acute dehydration, as the patient was     having voluminous biliary drainage output of approximately 1200 mL     per day.   Nephrology was consulted.  The  patient was seen.     Dialysis was considered.  Unfortunately, the patient's creatinine     slowly came down with holding of nephrotoxic medications and IV     fluids.  Over time, he started to urinate again.  His urinary     output has been good since.  His creatinine today is 1.03. 5. Coronary artery disease with recent myocardial infarction and     stenting in April 2012.  The patient is now off aspirin and Plavix     secondary to GI bleeding.  Aspirin is scheduled to be restarted on     May 29, 2011.  The patient has also had episodes of volume     overload requiring Lasix.  His BNP today was elevated over 2000.     Daily weights are being checked.  His baseline weight appears to be     about 75 kg.  Currently, he is 83.7 kg.  He had a 2-D echo done on     May 09, 2011 that showed an EF of 55-60%.  His volume status     will continue to be monitored and it is important that his aspirin     and Plavix be restarted as soon as it is safe. 6. Failure to thrive.  The patient has not been eating well.  He is     noted to have a serum albumin of 2.1.  He was seen by Nutrition who     recommended clinical strength Ensure t.i.d., the patient has been     consuming that. 7. Code status.  This patient is a full code.  Palliative Care was     consulted and saw the patient, his wife, and spoke with his     daughter as well today on May 22, 2011.  Currently, the patient     is a full code.  The family would like to continue aggressive care.     The patient does have a living will but states he would not be     receptive to existing in a persistent vegetative state but the     family is not ready to minimize treatment efforts at this time. 8. Disposition.  When the patient is clinically stable, he has been     accepted by [facility] for strengthening and conditioning     prior to discharge to home.  PHYSICAL EXAMINATION: GENERAL:  Currently, the  patient is awake, he is alert to person, he is cheerful, and he is very hard-of-hearing. VITAL SIGNS:  Temperature is 99.5, pulse 98, respirations 16, blood pressure 138/86, and O2 sats 94% on room air. HEAD:  Atraumatic and normocephalic. EYES:  Anicteric.  Pupils are equal and round. NOSE:  No nasal discharge or exterior lesions. EARS:  Have hearing aids. MOUTH:  Moist mucous membranes with moderate dentition. NECK:  Supple with midline trachea.  No JVD.  No lymphadenopathy. CHEST:  Right-sided PICC.  He has no increased work of breathing.  He has no wheezes or crackles. HEART:  Regular rate and rhythm without obvious murmurs, rubs, or gallops. ABDOMEN:  Slightly distended, nontender.  He has a percutaneous biliary tube in place.  The bandage is clean and dry.  He has regular bowel sounds. EXTREMITIES:  His upper extremities do have some edema bilaterally, right slightly greater than left.  Lower extremities do not show any edema at this point. NEUROLOGIC:  The patient is alert to person.  He has periods of  confusion.  Since the episode approximately May 09, 2011, he has developed significantly more slurred speech.  Other than that, he has remained neurologically intact. SKIN:  The patient has no rashes, bruises, or lesions. PSYCHIATRIC:  The patient is pleasant, although he is very hard of hearing.  His demeanor is cooperative.  Grooming is moderate.  LABORATORY DATA: Today WBC is 7.6, hemoglobin 8.3, hematocrit 24.6, and platelets 121. Sodium 137, potassium 3.7, chloride 105, bicarb 26, glucose 111, BUN 10, and creatinine 1.03.  Alkaline phosphatase 691, AST 315, ALT 173, total protein 4.8, total bilirubin 0.8, serum albumin 2.1, and calcium 8.6. Ammonia level 20.  BNP 2074.  Blood cultures are pending.  The patient had a serum H. pylori antibody test that was in the equivocal category at 1.05, however, it is elevated.  For pertinent radiological exams, the patient's last  CT head on May 11, 2011 showed no acute abnormality but did have extensive chronic and old ischemic changes.  He had an ultrasound of his kidneys this admission that showed renal size is within normal limits.  No obstruction.  It was a negative renal ultrasound.  Other than that, there is consideration being given to possible CT abdomen and pelvis with contrast to be done today if his LFTs have not decreased.  MICROBIOLOGY DATA: Urine cultures from August 9 , 2012 show no growth.  Blood cultures from May 09, 2011 show no growth.  Final blood cultures from May 22, 2011 are pending.  CURRENT MEDICATIONS: 1. Benazepril 40 mg p.o. daily.  This was started on May 21, 2011. 2. Coreg 25 mg 1 tablet p.o. b.i.d. 3. Colace 200 mg p.o. b.i.d. 4. Ensure 237 mL p.o. t.i.d. 5. Ferrous sulfate 325 mg p.o. t.i.d. 6. Folic acid 1 mg p.o. daily. 7. Lasix 40 mg p.o. p.r.n. 8. Insulin NovoLog sliding scale with nighttime coverage. 9. Protonix 40 mg p.o. q.12. 10.Protein supplement ProSource 30 mL p.o. q.24. 11.Sucralfate 1 gram p.o. q.i.d. 12.Acetaminophen 325-650 mg p.o. q.4 h. p.r.n.     Stephani Police, PA   ______________________________ Pleas Koch, MD    MLY/MEDQ  D:  05/22/2011  T:  05/23/2011  Job:  [id number]  Electronically Signed by Algis Downs PA on 05/25/2011 09:19:53 AM Electronically Signed by Pleas Koch MD on 07/22/2011 08:05:08 PM

## 2011-07-23 NOTE — Progress Notes (Signed)
NAME:  [name], [name] NO.:  [id number]  MEDICAL RECORD NO.:  [id number]  LOCATION:  5527                         FACILITY:  [facility]  PHYSICIAN:  Baltazar Najjar, MD     DATE OF BIRTH:  [birth year]                                PROGRESS NOTE   PRIMARY CARE PHYSICIAN: Marjory Lies, MD  PRIMARY INFECTIOUS DISEASE DOCTOR: Gardiner Barefoot, MD.  Also this admission, he was seen by Dr. Paulette Blanch Dam.  PRIMARY GASTROENTEROLOGIST: Jordan Hawks. Elnoria Howard, MD  INTERVENTIONAL RADIOLOGIST: Art A. Hoss, MD  CURRENT WORKING DIAGNOSES: 1. Acute mental status changes as of May 08, 2011, of uncertain     etiology. 2. Acute renal failure with oliguria and metabolic acidosis. 3. Acute cholangitis/biliary pancreatitis.  Biliary cultures positive     for Candida, VRE, Pseudomonas. 4. Type 2 diabetes - holding metformin. 5. Hypertension - holding ACE inhibitor. 6. Dehydration with decreased oral intake and increased biliary     drainage output.  The rest of his diagnoses are consistent with his past medical history and include: 1. Dementia. 2. History of MI in April 2012 with bare-metal stent placement. 3. Ischemic cardiomyopathy with an ejection fraction of 45%. 4. History of high-grade AV block, status post pacemaker placement. 5. Hyperlipidemia. 6. TIA.  HISTORY AND BRIEF HOSPITAL COURSE: [name] is an 77 year old gentleman with history listed above.  He was recently hospitalized on April 19, 2011 through April 29, 2011, with the diagnosis at that time of acute cholangitis and biliary pancreatitis.  He was discharged to home on ciprofloxacin and daptomycin.  He was also continued on his ACE inhibitor, benazepril, and his metformin.    He presented to the emergency department on May 02, 2011, at [facility].  His wife reported that he had not been eating much and had made virtually no urine in the past 3 days.  He was found to have a creatinine of 9.7.  Foley  catheter was placed and return less than 50 mL of urine.  The patient was admitted, initially antibiotics were held, so that a followup Infectious Disease consult could be held an antibiotic recommendations could be made.  Also, a Renal consultation was called and the patient was seen by Dr. Arrie Aran of Nephrology. Dr. Arrie Aran recommended transferring the patient to [facility] in case emergent dialysis was needed.  1. Acute renal failure with oliguria and metabolic acidosis.  Multiple     etiologies could have caused his acute renal failure including     antibiotics including daptomycin and Cipro.  Consequently, these     were held.  Other chronic medications including an ACE inhibitor     and metformin could have contributed to his acute renal failure as     well.  These were held also.  There is also concern that the     patient has severe dehydration caused by increased biliary drainage     output of approximately 1200 mL a day and decreased oral intake     contributed to his acute renal failure.  When the patient was first     brought to [facility], he had a creatinine of approximately  10, it     is slowly decreasing and today is 7.72.  The patient has not     required dialysis.  His creatinine was originally improved with IV     fluids and withholding of nephrotoxic medications.  We will     continue to monitor his creatinine in-house.  Renal is still     following him as an inpatient.  2. Acute cholangitis with VRE, Candida and Pseudomonas in his biliary     drain.  The patient was seen by Dr. Paulette Blanch Dam of Infectious     Disease during this admission.  Dr. Daiva Eves recommended based on     the patient's biliary drain cultures that he be started on Zyvox     for 10 days.  The patient did complete 10 days worth of Zyvox     treatment.  His white count is currently 10.4.  He has not had any     fevers and is not showing any signs of sepsis.    For his acute      cholangitis and biliary pancreatitis, a T-tube drain was placed     during a previous hospitalization.  It was noted early in this     hospitalization that it was draining copious amounts of biliary     fluid.  This has decreased substantially on May 06, 2011, his     biliary drain output was 1250.  On May 07, 2011, it was 500 and     thus far today on May 08, 2011 at 20 mL.  Coincidentally, as his     drain output has decreased, his urinary output has increased,     urinary output for May 08, 2011 thus far is 1850 mL.  He is no     longer on any IV fluids.  The plan for the biliary drain is that     tomorrow the patient will go to Interventional Radiology and will     have a biliary stent placed.  I believe the drain will be removed.     He will receive one more dose of Zyvox for that procedure.  3. Acute mental status change.  During exam today, I found the patient     to be slouched to the right with a strong right facial droop.  His     words were unintelligible.  He followed commands, but it was      very slowly.  Workup for acute mental status change has been     ordered including a stat head CT, ammonia level, CBG, cardiac     enzymes, LFTs.  The patient will be monitored closely.  PERTINENT LABORATORY STUDIES: Today, the patient's white count is 10.3, hemoglobin 10.1, hematocrit 30.1, platelets 373.  Sodium 135, potassium 3.8, chloride 88, bicarb 32, glucose 98, BUN 66, creatinine 7.72, serum albumin 1.8, serum calcium 8.6, serum phosphorus 6.6.  PERTINENT RADIOLOGICAL EXAM: The patient had a renal ultrasound this admission on May 03, 2011, that showed renal size within normal limits, no obstruction.  His head CT for altered mental status is pending.  CURRENT MEDICATIONS: 1. Aspirin 162 mg p.o. daily. 2. Coreg 12.5 mg p.o. b.i.d. 3. Fortaz 1 g IV on call for May 09, 2011, procedure. 4. Aricept 5 mg p.o. nightly. 5. Sliding scale insulin 1-9 units t.i.d.  with meals. 6. Zyvox, he finished his 11-day course yesterday and his Zyvox     discontinued; however, he will receive another dose  tomorrow just     before his procedure. 7. Magnesium oxide 400 mg one tablet p.o. daily. 8. Multivitamin one tablet p.o. daily.  NEXT STEPS: We will evaluate head CT and the other workup for ulcer mental status. He will have his biliary stent placed tomorrow.  He has yet to have a PT/OT evaluation, but will likely need skilled nursing placement after this hospitalization.     Stephani Police, PA   ______________________________ Baltazar Najjar, MD    MLY/MEDQ  D:  05/08/2011  T:  05/09/2011  Job:  [id number]  Electronically Signed by Algis Downs PA on 05/10/2011 09:13:58 AM Electronically Signed by Hannah Beat MD on 07/23/2011 02:47:25 PM

## 2011-07-27 ENCOUNTER — Other Ambulatory Visit: Payer: Self-pay | Admitting: Cardiovascular Disease

## 2011-09-27 ENCOUNTER — Telehealth: Payer: Self-pay | Admitting: Cardiology

## 2011-09-27 MED ORDER — CLOPIDOGREL BISULFATE 75 MG PO TABS: 75.0000 mg | ORAL_TABLET | Freq: Every day | ORAL | Status: DC

## 2011-09-27 NOTE — Telephone Encounter (Signed)
Pt needs clopidogrel 75mg  qd called into [facility] [address] and they have been trying for 2 days to get medication filled

## 2011-09-27 NOTE — Telephone Encounter (Signed)
[facility].  Pt was notified.

## 2011-10-03 ENCOUNTER — Other Ambulatory Visit: Payer: Self-pay | Admitting: *Deleted

## 2011-10-03 MED ORDER — CLOPIDOGREL BISULFATE 75 MG PO TABS: 75.0000 mg | ORAL_TABLET | Freq: Every day | ORAL | Status: DC

## 2011-10-12 ENCOUNTER — Ambulatory Visit: Payer: No Typology Code available for payment source | Admitting: Cardiology

## 2011-10-23 ENCOUNTER — Other Ambulatory Visit: Payer: Self-pay | Admitting: *Deleted

## 2011-10-23 MED ORDER — EZETIMIBE 10 MG PO TABS: 10.0000 mg | ORAL_TABLET | Freq: Every day | ORAL | Status: DC

## 2011-10-31 ENCOUNTER — Encounter: Payer: Self-pay | Admitting: Cardiology

## 2011-10-31 ENCOUNTER — Ambulatory Visit (INDEPENDENT_AMBULATORY_CARE_PROVIDER_SITE_OTHER): Payer: No Typology Code available for payment source | Admitting: Cardiology

## 2011-10-31 VITALS — BP 133/89 | HR 88 | Ht 66.0 in | Wt 194.8 lb

## 2011-10-31 DIAGNOSIS — I441 Atrioventricular block, second degree: Secondary | ICD-10-CM

## 2011-10-31 DIAGNOSIS — I2109 ST elevation (STEMI) myocardial infarction involving other coronary artery of anterior wall: Secondary | ICD-10-CM

## 2011-10-31 DIAGNOSIS — I509 Heart failure, unspecified: Secondary | ICD-10-CM

## 2011-10-31 DIAGNOSIS — E785 Hyperlipidemia, unspecified: Secondary | ICD-10-CM

## 2011-10-31 DIAGNOSIS — I2510 Atherosclerotic heart disease of native coronary artery without angina pectoris: Secondary | ICD-10-CM

## 2011-10-31 DIAGNOSIS — I5022 Chronic systolic (congestive) heart failure: Secondary | ICD-10-CM

## 2011-10-31 MED ORDER — BENAZEPRIL HCL 20 MG PO TABS: 20.0000 mg | ORAL_TABLET | Freq: Every day | ORAL | Status: DC

## 2011-10-31 MED ORDER — SIMVASTATIN 20 MG PO TABS: 20.0000 mg | ORAL_TABLET | Freq: Every evening | ORAL | Status: DC

## 2011-10-31 NOTE — Assessment & Plan Note (Signed)
Lipid levels are well controlled on current medication.

## 2011-10-31 NOTE — Assessment & Plan Note (Addendum)
He is status post pacemaker implant. We'll followup with our pacemaker clinic.

## 2011-10-31 NOTE — Patient Instructions (Signed)
Continue your current medications. You do need to take carvedilol as before.  We will get a copy of lab work from Dr. Doristine Counter.  I will see you again in 6 months.

## 2011-10-31 NOTE — Assessment & Plan Note (Signed)
He has no recurrent angina. Would recommend that he continue on aspirin and Plavix for a least one year from his stent procedure. At that point we could consider stopping Plavix.

## 2011-10-31 NOTE — Assessment & Plan Note (Signed)
He is well compensated on exam today. He ran out of his carvedilol couple of weeks ago and wasn't sure that he needed to continue it. I told him that he did. Have reinforced the need for sodium restriction. We will continue on his current therapy and followup again in 6 months.

## 2011-10-31 NOTE — Progress Notes (Signed)
[name] Date of Birth: 09/22/1927   History of Present Illness: [name] is seen for followup status post acute anterior myocardial infarction in April of this year treated with direct stenting of the mid LAD. He also had second-degree heart block with significant bradycardia that was symptomatic and he underwent a permanent pacemaker. His pacemaker followup was unremarkable. Since his last visit here he was hospitalized in August for 30 days. He had cholangitis and underwent a percutaneous biliary drain followed by stenting. He developed acute renal failure and also had a gastrointestinal bleed secondary to duodenal ulcers. His ACE inhibitor, aspirin, Plavix, and Zocor were all held. These were all resumed on his last visit in October. He currently denies any significant shortness of breath or chest pain. He's had no palpitations. He has no edema.    Current Outpatient Prescriptions on File Prior to Visit  Medication Sig Dispense Refill  . acetaminophen (TYLENOL) 325 MG tablet Take 650 mg by mouth as needed.        . carvedilol (COREG) 12.5 MG tablet TAKE 1 TABLET BY MOUTH TWICE A DAY WITH MEALS  60 tablet  5  . clopidogrel (PLAVIX) 75 MG tablet Take 1 tablet (75 mg total) by mouth daily.  30 tablet  6  . donepezil (ARICEPT) 5 MG tablet Take 5 mg by mouth at bedtime.        Marland Kitchen ezetimibe (ZETIA) 10 MG tablet Take 1 tablet (10 mg total) by mouth daily.  30 tablet  6  . fesoterodine (TOVIAZ) 4 MG TB24 Take 4 mg by mouth daily.        . hydrochlorothiazide 25 MG tablet Take 12.5 mg by mouth daily.       . Magnesium 500 MG TABS Take 1 tablet by mouth daily.        . magnesium oxide (MAG-OX) 400 MG tablet Take 400 mg by mouth daily.        . Multiple Vitamins-Minerals (CENTRUM SILVER PO) Take 1 tablet by mouth daily.       . nitroGLYCERIN (NITROSTAT) 0.4 MG SL tablet Place 0.4 mg under the tongue every 5 (five) minutes as needed.        Marland Kitchen omeprazole (PRILOSEC) 20 MG capsule Take 20 mg by  mouth 2 (two) times daily.        . polyethylene glycol powder (GLYCOLAX/MIRALAX) powder Take 17 g by mouth at bedtime.          Allergies  Allergen Reactions  . Doxycycline   . Statins   . Penicillins Rash    Past Medical History  Diagnosis Date  . Chest discomfort   . Coronary artery disease   . Diabetes mellitus   . Hypertension   . Hyperlipidemia   . Obesities, morbid   . Gallstone pancreatitis     recurrent  . TIA (transient ischemic attack)   . Bacteremia due to vancomycin resistant Enterococcus   . GERD (gastroesophageal reflux disease)   . Hearing impairment   . Myocardial infarction, anterior wall, subsequent care   . Second degree AV block, Mobitz type II     s/p PPM by JA 4/12  . ARF (acute renal failure)   . Cholangitis   . Duodenal ulcer   . GI bleed   . Anemia associated with acute blood loss     Past Surgical History  Procedure Date  . Cholecystectomy   . Pacemaker placement 01/01/11    implanted by JA for mobitz II  AV block  . Pancreatic stent     History  Smoking status  . Former Smoker -- 1.0 packs/day for 57 years  . Types: Cigarettes  . Quit date: 01/12/1996  Smokeless tobacco  . Never Used    History  Alcohol Use No    History reviewed. No pertinent family history.  Review of Systems: The review of systems is positive for generalized fatigue. He is hard of hearing. He has no edema. All other systems were reviewed and are negative.  Physical Exam: BP 133/89  Pulse 88  Ht 5\' 6"  (1.676 m)  Wt 194 lb 12.8 oz (88.361 kg)  BMI 31.44 kg/m2 He is an elderly overweight white male who is very hard of hearing. He is in no distress. HEENT exam reveals he is wearing hearing aid. His pupils are equal round and reactive. Sclera are clear. Oropharynx is clear with poor dentition. Neck is without JVD, adenopathy, or bruits. Lungs are clear. Cardiac exam reveals a regular rate and rhythm without gallop, murmur, or click. His pacemaker site has  healed completely. Abdomen is soft and nontender without masses or hepatosplenomegaly. Femoral and pedal pulses are 2+ and symmetric. He has 1+ edema. Other than his hearing losses cranial nerves II through XII are intact. He has no focal findings.  LABORATORY DATA: Dated 07/20/2011 his total cholesterol 142, triglycerides 112, LDL 61, HDL 59. A1c 5.3% chemistries were normal. Creatinine was 1.3. CBC was normal. ECG today demonstrates normal sinus rhythm with first degree AV block and a left anterior fascicular block.  Assessment / Plan:

## 2011-11-01 ENCOUNTER — Other Ambulatory Visit: Payer: Self-pay

## 2011-11-01 ENCOUNTER — Telehealth: Payer: Self-pay | Admitting: Cardiology

## 2011-11-01 MED ORDER — CARVEDILOL 12.5 MG PO TABS: 12.5000 mg | ORAL_TABLET | Freq: Two times a day (BID) | ORAL | Status: DC

## 2011-11-01 NOTE — Telephone Encounter (Signed)
Patient's wife was called she wanted to know dose of carvedilol.Advised carvedilol 12.5 mg twice a day.Advised patient should continue all medications.Advised to call back if needed.

## 2011-11-01 NOTE — Telephone Encounter (Signed)
PT WAS TO STAY ON CARVEDILOL PER DR [place] AT YESTERDAY'S VISIT , PT IS OUT AND NEEDS REFILL WANTS TO VERIFY 12.5 MG OR 25 MG, AND 1 OR TWO A DAY, USES [facility] [address]

## 2011-11-26 ENCOUNTER — Other Ambulatory Visit: Payer: Self-pay | Admitting: Cardiology

## 2011-12-31 ENCOUNTER — Ambulatory Visit (INDEPENDENT_AMBULATORY_CARE_PROVIDER_SITE_OTHER): Payer: No Typology Code available for payment source | Admitting: Internal Medicine

## 2011-12-31 ENCOUNTER — Encounter: Payer: Self-pay | Admitting: Internal Medicine

## 2011-12-31 VITALS — BP 114/73 | HR 76 | Resp 18 | Ht 66.0 in | Wt 193.1 lb

## 2011-12-31 DIAGNOSIS — I441 Atrioventricular block, second degree: Secondary | ICD-10-CM

## 2011-12-31 LAB — PACEMAKER DEVICE OBSERVATION
AL AMPLITUDE: 4.1 mv
AL THRESHOLD: 0.625 V
ATRIAL PACING PM: 2.1
BAMS-0001: 180 {beats}/min
DEVICE MODEL PM: 7228914
RV LEAD THRESHOLD: 1.5 V
VENTRICULAR PACING PM: 1

## 2011-12-31 NOTE — Patient Instructions (Addendum)
Remote monitoring is used to monitor your Pacemaker of ICD from home. This monitoring reduces the number of office visits required to check your device to one time per year. It allows [place] to keep an eye on the functioning of your device to ensure it is working properly. You are scheduled for a device check from home on April 03, 2012. You may send your transmission at any time that day. If you have a wireless device, the transmission will be sent automatically. After your physician reviews your transmission, you will receive a postcard with your next transmission date.  Your physician wants you to follow-up in: 12 months with Dr Jacquiline Doe will receive a reminder letter in the mail two months in advance. If you don't receive a letter, please call our office to schedule the follow-up appointment.

## 2011-12-31 NOTE — Progress Notes (Signed)
The patient presents today for routine electrophysiology followup.   Today, he denies symptoms of palpitations, chest pain, shortness of breath, orthopnea, PND, lower extremity edema, dizziness, presyncope, syncope, or neurologic sequela.  He is not very active but feels well.  The patient feels that he is tolerating medications without difficulties and is otherwise without complaint today.   Past Medical History  Diagnosis Date  . Chest discomfort   . Coronary artery disease   . Diabetes mellitus   . Hypertension   . Hyperlipidemia   . Obesities, morbid   . Gallstone pancreatitis     recurrent  . TIA (transient ischemic attack)   . Bacteremia due to vancomycin resistant Enterococcus   . GERD (gastroesophageal reflux disease)   . Hearing impairment   . Myocardial infarction, anterior wall, subsequent care   . Second degree AV block, Mobitz type II     s/p PPM by JA 4/12  . ARF (acute renal failure)   . Cholangitis   . Duodenal ulcer   . GI bleed   . Anemia associated with acute blood loss    Past Surgical History  Procedure Date  . Cholecystectomy   . Pacemaker placement 01/01/11    implanted by JA for mobitz II AV block  . Pancreatic stent     Current Outpatient Prescriptions  Medication Sig Dispense Refill  . benazepril (LOTENSIN) 20 MG tablet Take 1 tablet (20 mg total) by mouth daily.      . carvedilol (COREG) 12.5 MG tablet Take 1 tablet (12.5 mg total) by mouth 2 (two) times daily with a meal.  60 tablet  6  . clopidogrel (PLAVIX) 75 MG tablet Take 1 tablet (75 mg total) by mouth daily.  30 tablet  6  . ezetimibe (ZETIA) 10 MG tablet Take 1 tablet (10 mg total) by mouth daily.  30 tablet  6  . fesoterodine (TOVIAZ) 4 MG TB24 Take 4 mg by mouth daily.        . hydrochlorothiazide 25 MG tablet Take 12.5 mg by mouth daily.       . Magnesium 500 MG TABS Take 1 tablet by mouth daily.        . magnesium oxide (MAG-OX) 400 MG tablet Take 400 mg by mouth daily.        .  Multiple Vitamins-Minerals (CENTRUM SILVER PO) Take 1 tablet by mouth daily.       . nitroGLYCERIN (NITROSTAT) 0.4 MG SL tablet Place 0.4 mg under the tongue every 5 (five) minutes as needed.        Marland Kitchen omeprazole (PRILOSEC) 20 MG capsule Take 20 mg by mouth 2 (two) times daily.        . polyethylene glycol powder (GLYCOLAX/MIRALAX) powder Take 17 g by mouth at bedtime.        . simvastatin (ZOCOR) 20 MG tablet Take 1 tablet (20 mg total) by mouth every evening.  30 tablet  11  . acetaminophen (TYLENOL) 325 MG tablet Take 650 mg by mouth as needed.          Allergies  Allergen Reactions  . Doxycycline   . Statins   . Penicillins Rash    History   Social History  . Marital Status: Married    Spouse Name: N/A    Number of Children: 1  . Years of Education: N/A   Occupational History  . 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 Smoking  status: Former Smoker -- 1.0 packs/day for 57 years    Types: Cigarettes    Quit date: 01/12/1996  . Smokeless tobacco: Never Used  . Alcohol Use: No  . Drug Use: No  . Sexually Active: Not on file   Other Topics Concern  . Not on file   Social History Narrative  . No narrative on file    Physical Exam: Filed Vitals:   12/31/11 0923  BP: 114/73  Pulse: 76  Resp: 18  Height: 5\' 6"  (1.676 m)  Weight: 193 lb 1.9 oz (87.599 kg)    GEN- The patient is well appearing, alert and oriented x 3 today.   Head- normocephalic, atraumatic Eyes-  Sclera clear, conjunctiva pink Ears- hearing intact Oropharynx- clear Neck- supple, no JVP Lymph- no cervical lymphadenopathy Lungs- Clear to ausculation bilaterally, normal work of breathing Chest- pacemaker pocket is well healed Heart- Regular rate and rhythm, no murmurs, rubs or gallops, PMI not laterally displaced GI- soft, NT, ND, + BS Extremities- no clubbing, cyanosis, chronic R>L edema MS- no significant deformity or atrophy  Pacemaker interrogation- reviewed in  detail today,  See PACEART report  Assessment and Plan:

## 2011-12-31 NOTE — Assessment & Plan Note (Signed)
Normal pacemaker function See Pace Art report No changes today  

## 2012-01-03 ENCOUNTER — Encounter: Payer: Self-pay | Admitting: Internal Medicine

## 2012-01-24 ENCOUNTER — Other Ambulatory Visit: Payer: Self-pay | Admitting: Cardiology

## 2012-01-24 NOTE — Telephone Encounter (Signed)
[facility] Patient still had refills from 10/03/11 rx

## 2012-04-03 ENCOUNTER — Ambulatory Visit (INDEPENDENT_AMBULATORY_CARE_PROVIDER_SITE_OTHER): Payer: No Typology Code available for payment source | Admitting: *Deleted

## 2012-04-03 ENCOUNTER — Encounter: Payer: Self-pay | Admitting: Internal Medicine

## 2012-04-03 DIAGNOSIS — I441 Atrioventricular block, second degree: Secondary | ICD-10-CM

## 2012-04-04 LAB — REMOTE PACEMAKER DEVICE
AL IMPEDENCE PM: 380 Ohm
AL THRESHOLD: 0.625 V
ATRIAL PACING PM: 3.4
BAMS-0003: 70 {beats}/min
BATTERY VOLTAGE: 2.96 V
DEVICE MODEL PM: 7228914
RV LEAD IMPEDENCE PM: 590 Ohm
VENTRICULAR PACING PM: 1

## 2012-04-21 ENCOUNTER — Encounter: Payer: Self-pay | Admitting: *Deleted

## 2012-04-29 ENCOUNTER — Ambulatory Visit (INDEPENDENT_AMBULATORY_CARE_PROVIDER_SITE_OTHER): Payer: No Typology Code available for payment source | Admitting: Cardiology

## 2012-04-29 ENCOUNTER — Encounter: Payer: Self-pay | Admitting: Cardiology

## 2012-04-29 VITALS — BP 126/68 | HR 64 | Ht 66.0 in | Wt 197.4 lb

## 2012-04-29 DIAGNOSIS — E785 Hyperlipidemia, unspecified: Secondary | ICD-10-CM

## 2012-04-29 DIAGNOSIS — I252 Old myocardial infarction: Secondary | ICD-10-CM

## 2012-04-29 DIAGNOSIS — I10 Essential (primary) hypertension: Secondary | ICD-10-CM

## 2012-04-29 DIAGNOSIS — I441 Atrioventricular block, second degree: Secondary | ICD-10-CM

## 2012-04-29 DIAGNOSIS — I2510 Atherosclerotic heart disease of native coronary artery without angina pectoris: Secondary | ICD-10-CM

## 2012-04-29 NOTE — Assessment & Plan Note (Signed)
He is now more than a year out from his previous myocardial infarction and stenting of the LAD. I recommended stopping his Plavix at this point. We will continue with aspirin long-term. Continue risk factor modification with simvastatin and beta blocker therapy.

## 2012-04-29 NOTE — Progress Notes (Signed)
[name] Date of Birth: 1926-10-08   History of Present Illness: [name] is seen for followup today. He is status post acute anterior myocardial infarction in April of 2012 treated with direct stenting of the mid LAD. He also had second-degree heart block with significant bradycardia that was symptomatic and he underwent a permanent pacemaker. His pacemaker followup was unremarkable. Since his last visit here he has done well from a cardiac standpoint. He denies any symptoms of chest pain. He does feel that his breathing is a little worse than it has been in the past. He generally feels well but is very sedentary. He is very hard of hearing.   Current Outpatient Prescriptions on File Prior to Visit  Medication Sig Dispense Refill  . benazepril (LOTENSIN) 20 MG tablet Take 1 tablet (20 mg total) by mouth daily.      . carvedilol (COREG) 12.5 MG tablet Take 1 tablet (12.5 mg total) by mouth 2 (two) times daily with a meal.  60 tablet  6  . clopidogrel (PLAVIX) 75 MG tablet Take 1 tablet (75 mg total) by mouth daily.  30 tablet  6  . ezetimibe (ZETIA) 10 MG tablet Take 1 tablet (10 mg total) by mouth daily.  30 tablet  6  . hydrochlorothiazide 25 MG tablet Take 12.5 mg by mouth daily.       . Magnesium 500 MG TABS Take 1 tablet by mouth daily.        . Multiple Vitamins-Minerals (CENTRUM SILVER PO) Take 1 tablet by mouth daily.       . nitroGLYCERIN (NITROSTAT) 0.4 MG SL tablet Place 0.4 mg under the tongue every 5 (five) minutes as needed.        Marland Kitchen omeprazole (PRILOSEC) 20 MG capsule Take 20 mg by mouth 2 (two) times daily.        . polyethylene glycol powder (GLYCOLAX/MIRALAX) powder Take 17 g by mouth at bedtime.        . simvastatin (ZOCOR) 20 MG tablet Take 1 tablet (20 mg total) by mouth every evening.  30 tablet  11    Allergies  Allergen Reactions  . Doxycycline   . Statins   . Penicillins Rash    Past Medical History  Diagnosis Date  . Chest discomfort   . Coronary  artery disease   . Diabetes mellitus   . Hypertension   . Hyperlipidemia   . Obesities, morbid   . Gallstone pancreatitis     recurrent  . TIA (transient ischemic attack)   . Bacteremia due to vancomycin resistant Enterococcus   . GERD (gastroesophageal reflux disease)   . Hearing impairment   . Myocardial infarction, anterior wall, subsequent care   . Second degree AV block, Mobitz type II     s/p PPM by JA 4/12  . ARF (acute renal failure)   . Cholangitis   . Duodenal ulcer   . GI bleed   . Anemia associated with acute blood loss     Past Surgical History  Procedure Date  . Cholecystectomy   . Pacemaker placement 01/01/11    implanted by JA for mobitz II AV block  . Pancreatic stent     History  Smoking status  . Former Smoker -- 1.0 packs/day for 57 years  . Types: Cigarettes  . Quit date: 01/12/1996  Smokeless tobacco  . Never Used    History  Alcohol Use No    History reviewed. No pertinent family history.  Review  of Systems: The review of systems is positive for generalized fatigue. He is hard of hearing. He has no edema. All other systems were reviewed and are negative.  Physical Exam: BP 126/68  Pulse 64  Ht 5\' 6"  (1.676 m)  Wt 197 lb 6.4 oz (89.54 kg)  BMI 31.86 kg/m2  SpO2 95% He is an elderly overweight white male who is very hard of hearing. He is in no distress. HEENT exam reveals he is wearing hearing aid. His pupils are equal round and reactive. Sclera are clear. Oropharynx is clear. Neck is without JVD, adenopathy, or bruits. Lungs are clear. Cardiac exam reveals a regular rate and rhythm without gallop, murmur, or click. His pacemaker site is normal. Abdomen is soft and nontender without masses or hepatosplenomegaly. Femoral and pedal pulses are 2+ and symmetric. He has 1+ edema. Other than his hearing losses cranial nerves II through XII are intact. He has no focal findings.  LABORATORY DATA:   Assessment / Plan:

## 2012-04-29 NOTE — Assessment & Plan Note (Signed)
Recent pacemaker interrogation was satisfactory.

## 2012-04-29 NOTE — Patient Instructions (Signed)
Stop taking Plavix (clopidogrel)  Continue ASA and your other medication  I will see you again in 6 months.

## 2012-05-17 ENCOUNTER — Other Ambulatory Visit: Payer: Self-pay | Admitting: Cardiology

## 2012-06-23 ENCOUNTER — Inpatient Hospital Stay (HOSPITAL_BASED_OUTPATIENT_CLINIC_OR_DEPARTMENT_OTHER)
Admission: EM | Admit: 2012-06-23 | Discharge: 2012-06-26 | DRG: 191 | Disposition: A | Discharge status: Home Health | Payer: No Typology Code available for payment source | Attending: Internal Medicine | Admitting: Internal Medicine

## 2012-06-23 ENCOUNTER — Encounter (HOSPITAL_BASED_OUTPATIENT_CLINIC_OR_DEPARTMENT_OTHER): Payer: Self-pay | Admitting: *Deleted

## 2012-06-23 ENCOUNTER — Emergency Department (HOSPITAL_BASED_OUTPATIENT_CLINIC_OR_DEPARTMENT_OTHER): Payer: No Typology Code available for payment source

## 2012-06-23 DIAGNOSIS — K219 Gastro-esophageal reflux disease without esophagitis: Secondary | ICD-10-CM

## 2012-06-23 DIAGNOSIS — I2510 Atherosclerotic heart disease of native coronary artery without angina pectoris: Secondary | ICD-10-CM

## 2012-06-23 DIAGNOSIS — T380X5A Adverse effect of glucocorticoids and synthetic analogues, initial encounter: Secondary | ICD-10-CM | POA: Diagnosis not present

## 2012-06-23 DIAGNOSIS — Z880 Allergy status to penicillin: Secondary | ICD-10-CM

## 2012-06-23 DIAGNOSIS — I2109 ST elevation (STEMI) myocardial infarction involving other coronary artery of anterior wall: Secondary | ICD-10-CM

## 2012-06-23 DIAGNOSIS — D72829 Elevated white blood cell count, unspecified: Secondary | ICD-10-CM | POA: Diagnosis present

## 2012-06-23 DIAGNOSIS — Z888 Allergy status to other drugs, medicaments and biological substances status: Secondary | ICD-10-CM

## 2012-06-23 DIAGNOSIS — H9190 Unspecified hearing loss, unspecified ear: Secondary | ICD-10-CM | POA: Diagnosis present

## 2012-06-23 DIAGNOSIS — I10 Essential (primary) hypertension: Secondary | ICD-10-CM

## 2012-06-23 DIAGNOSIS — IMO0002 Reserved for concepts with insufficient information to code with codable children: Secondary | ICD-10-CM

## 2012-06-23 DIAGNOSIS — J440 Chronic obstructive pulmonary disease with acute lower respiratory infection: Principal | ICD-10-CM | POA: Diagnosis present

## 2012-06-23 DIAGNOSIS — Z6830 Body mass index (BMI) 30.0-30.9, adult: Secondary | ICD-10-CM

## 2012-06-23 DIAGNOSIS — I509 Heart failure, unspecified: Secondary | ICD-10-CM | POA: Diagnosis present

## 2012-06-23 DIAGNOSIS — Z87891 Personal history of nicotine dependence: Secondary | ICD-10-CM

## 2012-06-23 DIAGNOSIS — J439 Emphysema, unspecified: Secondary | ICD-10-CM | POA: Diagnosis present

## 2012-06-23 DIAGNOSIS — R7881 Bacteremia: Secondary | ICD-10-CM

## 2012-06-23 DIAGNOSIS — I5022 Chronic systolic (congestive) heart failure: Secondary | ICD-10-CM

## 2012-06-23 DIAGNOSIS — I252 Old myocardial infarction: Secondary | ICD-10-CM

## 2012-06-23 DIAGNOSIS — R062 Wheezing: Secondary | ICD-10-CM

## 2012-06-23 DIAGNOSIS — Z950 Presence of cardiac pacemaker: Secondary | ICD-10-CM

## 2012-06-23 DIAGNOSIS — R443 Hallucinations, unspecified: Secondary | ICD-10-CM | POA: Diagnosis not present

## 2012-06-23 DIAGNOSIS — M62838 Other muscle spasm: Secondary | ICD-10-CM | POA: Diagnosis not present

## 2012-06-23 DIAGNOSIS — I441 Atrioventricular block, second degree: Secondary | ICD-10-CM

## 2012-06-23 DIAGNOSIS — R0789 Other chest pain: Secondary | ICD-10-CM

## 2012-06-23 DIAGNOSIS — E860 Dehydration: Secondary | ICD-10-CM | POA: Diagnosis not present

## 2012-06-23 DIAGNOSIS — N179 Acute kidney failure, unspecified: Secondary | ICD-10-CM | POA: Diagnosis not present

## 2012-06-23 DIAGNOSIS — IMO0001 Reserved for inherently not codable concepts without codable children: Secondary | ICD-10-CM

## 2012-06-23 DIAGNOSIS — Z8673 Personal history of transient ischemic attack (TIA), and cerebral infarction without residual deficits: Secondary | ICD-10-CM

## 2012-06-23 DIAGNOSIS — G459 Transient cerebral ischemic attack, unspecified: Secondary | ICD-10-CM

## 2012-06-23 DIAGNOSIS — E785 Hyperlipidemia, unspecified: Secondary | ICD-10-CM

## 2012-06-23 DIAGNOSIS — Z7982 Long term (current) use of aspirin: Secondary | ICD-10-CM

## 2012-06-23 DIAGNOSIS — E119 Type 2 diabetes mellitus without complications: Secondary | ICD-10-CM | POA: Diagnosis present

## 2012-06-23 DIAGNOSIS — B952 Enterococcus as the cause of diseases classified elsewhere: Secondary | ICD-10-CM

## 2012-06-23 DIAGNOSIS — K851 Biliary acute pancreatitis without necrosis or infection: Secondary | ICD-10-CM

## 2012-06-23 DIAGNOSIS — J209 Acute bronchitis, unspecified: Principal | ICD-10-CM

## 2012-06-23 DIAGNOSIS — Z79899 Other long term (current) drug therapy: Secondary | ICD-10-CM

## 2012-06-23 DIAGNOSIS — Y9210 Unspecified residential institution as the place of occurrence of the external cause: Secondary | ICD-10-CM | POA: Diagnosis not present

## 2012-06-23 LAB — BASIC METABOLIC PANEL
Calcium: 8.8 mg/dL (ref 8.4–10.5)
Creatinine, Ser: 1.3 mg/dL (ref 0.50–1.35)
GFR calc non Af Amer: 49 mL/min — ABNORMAL LOW (ref >90)
Sodium: 136 mEq/L (ref 135–145)

## 2012-06-23 LAB — CBC
HCT: 39.4 % (ref 39.0–52.0)
MCH: 31.3 pg (ref 26.0–34.0)
MCHC: 33.4 g/dL (ref 30.0–36.0)
MCHC: 33.2 g/dL (ref 30.0–36.0)
Platelets: 218 10*3/uL (ref 150–400)
RDW: 13.3 % (ref 11.5–15.5)
RDW: 13.4 % (ref 11.5–15.5)
WBC: 10.4 10*3/uL (ref 4.0–10.5)

## 2012-06-23 LAB — PRO B NATRIURETIC PEPTIDE: Pro B Natriuretic peptide (BNP): 218.7 pg/mL (ref 0–450)

## 2012-06-23 LAB — TROPONIN I: Troponin I: <0.3 ng/mL (ref <0.30)

## 2012-06-23 LAB — CREATININE, SERUM: GFR calc non Af Amer: 47 mL/min — ABNORMAL LOW (ref >90)

## 2012-06-23 MED ORDER — BENAZEPRIL HCL 20 MG PO TABS
20.0000 mg | ORAL_TABLET | Freq: Every morning | ORAL | Status: DC
  Administered 2012-06-24 – 2012-06-26 (×3): 20 mg via ORAL
  Filled 2012-06-23 (×3): qty 1

## 2012-06-23 MED ORDER — SODIUM CHLORIDE 0.9 % IJ SOLN: 3.0000 mL | INTRAMUSCULAR | Status: DC | PRN

## 2012-06-23 MED ORDER — IPRATROPIUM BROMIDE 0.02 % IN SOLN
RESPIRATORY_TRACT | Status: AC
  Filled 2012-06-23: qty 2.5

## 2012-06-23 MED ORDER — DONEPEZIL HCL 10 MG PO TABS
10.0000 mg | ORAL_TABLET | Freq: Every day | ORAL | Status: DC
  Administered 2012-06-23 – 2012-06-25 (×3): 10 mg via ORAL
  Filled 2012-06-23 (×4): qty 1

## 2012-06-23 MED ORDER — ASPIRIN 325 MG PO TABS
162.5000 mg | ORAL_TABLET | Freq: Every evening | ORAL | Status: DC
  Administered 2012-06-23: 162.5 mg via ORAL
  Filled 2012-06-23 (×2): qty 0.5

## 2012-06-23 MED ORDER — ALBUTEROL SULFATE (5 MG/ML) 0.5% IN NEBU
5.0000 mg | INHALATION_SOLUTION | Freq: Once | RESPIRATORY_TRACT | Status: DC
  Administered 2012-06-23: 5 mg via RESPIRATORY_TRACT

## 2012-06-23 MED ORDER — ALBUTEROL SULFATE (5 MG/ML) 0.5% IN NEBU
2.5000 mg | INHALATION_SOLUTION | Freq: Four times a day (QID) | RESPIRATORY_TRACT | Status: DC
  Administered 2012-06-24 (×3): 2.5 mg via RESPIRATORY_TRACT
  Filled 2012-06-23 (×2): qty 0.5

## 2012-06-23 MED ORDER — ALBUTEROL SULFATE (5 MG/ML) 0.5% IN NEBU
INHALATION_SOLUTION | RESPIRATORY_TRACT | Status: AC
  Filled 2012-06-23: qty 1

## 2012-06-23 MED ORDER — IPRATROPIUM BROMIDE 0.02 % IN SOLN
0.5000 mg | Freq: Four times a day (QID) | RESPIRATORY_TRACT | Status: DC
  Administered 2012-06-24 (×3): 0.5 mg via RESPIRATORY_TRACT
  Filled 2012-06-23 (×2): qty 2.5

## 2012-06-23 MED ORDER — ALBUTEROL SULFATE (5 MG/ML) 0.5% IN NEBU: 2.5000 mg | INHALATION_SOLUTION | RESPIRATORY_TRACT | Status: DC | PRN

## 2012-06-23 MED ORDER — ALBUTEROL SULFATE (5 MG/ML) 0.5% IN NEBU
5.0000 mg | INHALATION_SOLUTION | Freq: Once | RESPIRATORY_TRACT | Status: AC
  Administered 2012-06-23: 5 mg via RESPIRATORY_TRACT

## 2012-06-23 MED ORDER — NITROGLYCERIN 0.4 MG SL SUBL: 0.4000 mg | SUBLINGUAL_TABLET | SUBLINGUAL | Status: DC | PRN

## 2012-06-23 MED ORDER — SODIUM CHLORIDE 0.9 % IJ SOLN: 3.0000 mL | Freq: Two times a day (BID) | INTRAMUSCULAR | Status: DC

## 2012-06-23 MED ORDER — IPRATROPIUM BROMIDE 0.02 % IN SOLN: 0.5000 mg | Freq: Once | RESPIRATORY_TRACT | Status: DC

## 2012-06-23 MED ORDER — IPRATROPIUM BROMIDE 0.02 % IN SOLN
0.5000 mg | Freq: Once | RESPIRATORY_TRACT | Status: AC
  Administered 2012-06-23: 0.5 mg via RESPIRATORY_TRACT
  Filled 2012-06-23: qty 2.5

## 2012-06-23 MED ORDER — FOLIC ACID 1 MG PO TABS
1.0000 mg | ORAL_TABLET | Freq: Every morning | ORAL | Status: DC
  Administered 2012-06-24 – 2012-06-26 (×3): 1 mg via ORAL
  Filled 2012-06-23 (×3): qty 1

## 2012-06-23 MED ORDER — IPRATROPIUM BROMIDE 0.02 % IN SOLN
0.5000 mg | Freq: Once | RESPIRATORY_TRACT | Status: AC
  Administered 2012-06-23: 0.5 mg via RESPIRATORY_TRACT

## 2012-06-23 MED ORDER — HEPARIN SODIUM (PORCINE) 5000 UNIT/ML IJ SOLN
5000.0000 [IU] | Freq: Three times a day (TID) | INTRAMUSCULAR | Status: DC
  Administered 2012-06-24 – 2012-06-26 (×7): 5000 [IU] via SUBCUTANEOUS
  Filled 2012-06-23 (×10): qty 1

## 2012-06-23 MED ORDER — PANTOPRAZOLE SODIUM 40 MG PO TBEC
40.0000 mg | DELAYED_RELEASE_TABLET | Freq: Every day | ORAL | Status: DC
  Administered 2012-06-24 – 2012-06-26 (×3): 40 mg via ORAL
  Filled 2012-06-23 (×3): qty 1

## 2012-06-23 MED ORDER — ALBUTEROL SULFATE (5 MG/ML) 0.5% IN NEBU
5.0000 mg | INHALATION_SOLUTION | Freq: Once | RESPIRATORY_TRACT | Status: AC
  Administered 2012-06-23: 5 mg via RESPIRATORY_TRACT
  Filled 2012-06-23: qty 1

## 2012-06-23 MED ORDER — SODIUM CHLORIDE 0.9 % IV SOLN: 250.0000 mL | INTRAVENOUS | Status: DC | PRN

## 2012-06-23 MED ORDER — SODIUM CHLORIDE 0.9 % IJ SOLN
3.0000 mL | Freq: Two times a day (BID) | INTRAMUSCULAR | Status: DC
  Administered 2012-06-24 – 2012-06-25 (×3): 3 mL via INTRAVENOUS

## 2012-06-23 MED ORDER — METHYLPREDNISOLONE SODIUM SUCC 125 MG IJ SOLR
125.0000 mg | Freq: Once | INTRAMUSCULAR | Status: AC
  Administered 2012-06-23: 125 mg via INTRAVENOUS
  Filled 2012-06-23: qty 2

## 2012-06-23 MED ORDER — CARVEDILOL 12.5 MG PO TABS
12.5000 mg | ORAL_TABLET | Freq: Two times a day (BID) | ORAL | Status: DC
  Administered 2012-06-23 – 2012-06-25 (×4): 12.5 mg via ORAL
  Filled 2012-06-23 (×5): qty 1

## 2012-06-23 MED ORDER — HYDROCHLOROTHIAZIDE 25 MG PO TABS
12.5000 mg | ORAL_TABLET | Freq: Every morning | ORAL | Status: DC
Start: 2012-06-24 — End: 2012-06-24
  Filled 2012-06-23: qty 0.5

## 2012-06-23 MED ORDER — SIMVASTATIN 20 MG PO TABS
20.0000 mg | ORAL_TABLET | Freq: Every evening | ORAL | Status: DC
  Administered 2012-06-23 – 2012-06-25 (×3): 20 mg via ORAL
  Filled 2012-06-23 (×4): qty 1

## 2012-06-23 MED ORDER — MAGNESIUM 500 MG PO TABS: 1.0000 | ORAL_TABLET | Freq: Every evening | ORAL | Status: DC

## 2012-06-23 MED ORDER — MAGNESIUM CHLORIDE 64 MG PO TBEC
1.0000 | DELAYED_RELEASE_TABLET | Freq: Every day | ORAL | Status: DC
  Administered 2012-06-23 – 2012-06-25 (×3): 64 mg via ORAL
  Filled 2012-06-23 (×4): qty 1

## 2012-06-23 MED ORDER — METHYLPREDNISOLONE SODIUM SUCC 40 MG IJ SOLR
40.0000 mg | Freq: Four times a day (QID) | INTRAMUSCULAR | Status: DC
  Administered 2012-06-23 – 2012-06-24 (×3): 40 mg via INTRAVENOUS
  Filled 2012-06-23 (×6): qty 1

## 2012-06-23 MED ORDER — EZETIMIBE 10 MG PO TABS
10.0000 mg | ORAL_TABLET | Freq: Every evening | ORAL | Status: DC
  Administered 2012-06-23 – 2012-06-25 (×3): 10 mg via ORAL
  Filled 2012-06-23 (×4): qty 1

## 2012-06-23 MED ORDER — INSULIN ASPART 100 UNIT/ML ~~LOC~~ SOLN
0.0000 [IU] | Freq: Three times a day (TID) | SUBCUTANEOUS | Status: DC
  Administered 2012-06-24: 5 [IU] via SUBCUTANEOUS
  Administered 2012-06-24 – 2012-06-25 (×4): 3 [IU] via SUBCUTANEOUS
  Administered 2012-06-25: 5 [IU] via SUBCUTANEOUS
  Administered 2012-06-26: 2 [IU] via SUBCUTANEOUS

## 2012-06-23 MED ORDER — LEVOFLOXACIN IN D5W 500 MG/100ML IV SOLN
500.0000 mg | INTRAVENOUS | Status: DC
  Administered 2012-06-23 – 2012-06-24 (×2): 500 mg via INTRAVENOUS
  Filled 2012-06-23 (×3): qty 100

## 2012-06-23 NOTE — ED Notes (Signed)
Sob. Was diagnosed 3 days ago with bronchitis. Feels worse.

## 2012-06-23 NOTE — Progress Notes (Signed)
PCP [facility] 84/ M, wheezing, ex smoker  EKG/CXR unchanged,  PCP started prednisone on Friday, with Bronchitis/COPD exacerbation Stable, accepted to tele, team 10  Zannie Cove, MD

## 2012-06-23 NOTE — H&P (Signed)
[facility] History and Physical  [name] ZOX:[id number] DOB: [birth year] DOA: 06/23/2012  Referring physician: ED PCP: Delorse Lek, MD   Chief Complaint: SOB  HPI: [name] is a 78 y.o. male with a long history of COPD and exacerbations of chronic bronchitis in the past.  The patient notes he has been short of breath for at least 3-5 days this time.  Symptoms are primarily wheezing, SOB, dry and non-productive cough, nothing relieves his cough nor worsens it.  Patient saw PCP who started prednisone for the bronchitis exacerbation on Friday, but wasn't really getting better so he went to the ED this evening and was sent over for admission.  Patient not normally on O2 at baseline at home.  Review of Systems: The patient denies, fever, chills, productive cough, 12 systems reviewed and otherwise negative.  Past Medical History  Diagnosis Date  . Chest discomfort   . Coronary artery disease   . Diabetes mellitus   . Hypertension   . Hyperlipidemia   . Obesities, morbid   . Gallstone pancreatitis     recurrent  . TIA (transient ischemic attack)   . Bacteremia due to vancomycin resistant Enterococcus   . GERD (gastroesophageal reflux disease)   . Hearing impairment   . Myocardial infarction, anterior wall, subsequent care   . Second degree AV block, Mobitz type II     s/p PPM by JA 4/12  . ARF (acute renal failure)   . Cholangitis   . Duodenal ulcer   . GI bleed   . Anemia associated with acute blood loss    Past Surgical History  Procedure Date  . Cholecystectomy   . Pacemaker placement 01/01/11    implanted by JA for mobitz II AV block  . Pancreatic stent    Social History:  reports that he quit smoking about 16 years ago. His smoking use included Cigarettes. He has a 57 pack-year smoking history. He has never used smokeless tobacco. He reports that he does not drink alcohol or use illicit drugs. Patient lives at home with wife.  Allergies  Allergen  Reactions  . Doxycycline     unknown  . Statins     unknown  . Penicillins Rash    No family history on file.  Prior to Admission medications   Medication Sig Start Date End Date Taking? Authorizing Provider  aspirin 325 MG tablet Take 162.5 mg by mouth every evening.   Yes Historical Provider, MD  azithromycin (ZITHROMAX) 250 MG tablet Take 250-500 mg by mouth daily. 2 tablets on day 1, then 1 tablet on days 2 through 5; Started on 9-27;  5 day course   Yes Historical Provider, MD  benazepril (LOTENSIN) 20 MG tablet Take 20 mg by mouth every morning.   Yes Historical Provider, MD  carvedilol (COREG) 12.5 MG tablet Take 12.5 mg by mouth 2 (two) times daily.   Yes Historical Provider, MD  donepezil (ARICEPT) 10 MG tablet Take 10 mg by mouth at bedtime.   Yes Historical Provider, MD  ezetimibe (ZETIA) 10 MG tablet Take 10 mg by mouth every evening.   Yes Historical Provider, MD  folic acid (FOLVITE) 1 MG tablet Take 1 mg by mouth every morning.   Yes Historical Provider, MD  hydrochlorothiazide 25 MG tablet Take 12.5 mg by mouth every morning.    Yes Historical Provider, MD  Magnesium 500 MG TABS Take 1 tablet by mouth every evening.    Yes Historical Provider, MD  Multiple Vitamins-Minerals (CENTRUM SILVER PO) Take 1 tablet by mouth every morning.    Yes Historical Provider, MD  nitroGLYCERIN (NITROSTAT) 0.4 MG SL tablet Place 0.4 mg under the tongue every 5 (five) minutes as needed. For chest pain   Yes Historical Provider, MD  omeprazole (PRILOSEC) 20 MG capsule Take 20 mg by mouth 2 (two) times daily.    Yes Historical Provider, MD  predniSONE (DELTASONE) 20 MG tablet Take 20 mg by mouth 2 (two) times daily with breakfast and lunch. For 7 days; started on 9-27   Yes Historical Provider, MD  simvastatin (ZOCOR) 20 MG tablet Take 20 mg by mouth every evening. 10/31/11 10/30/12 Yes Peter M [place], MD   Physical Exam: Filed Vitals:   06/23/12 1523 06/23/12 1534 06/23/12 1756 06/23/12 1856    BP:   135/69 153/81  Pulse:   62 70  Temp:   98.2 F (36.8 C) 98.3 F (36.8 C)  TempSrc:   Oral Oral  Resp:   20 20  Height:    5\' 7"  (1.702 m)  Weight:    89.3 kg (196 lb 13.9 oz)  SpO2: 95% 95% 97% 94%     General:  NAD  Eyes: PEERLA EOMI  ENT: moist mucous membranes, significant difficulty with hearing despite hearing aid in place, have to speak loudly  Neck: supple w/o JVD  Cardiovascular: RRR w/o MRG, very difficult to hear due to wheezing  Respiratory: severe and loud wheezing bilaterally is confirmed on ascultation, I am also able to hear this from across the room.  Abdomen: soft, nt, nd, bs+  Skin: no rash, dry  Musculoskeletal: MAE, full range of motion  Psychiatric: normal tone and affect, normal mentation  Neurologic: AAOx3, grossly non-focal exam  Labs on Admission:  Basic Metabolic Panel:  Lab 06/23/12 0102  NA 136  K 4.5  CL 98  CO2 24  GLUCOSE 169*  BUN 33*  CREATININE 1.30  CALCIUM 8.8  MG --  PHOS --   Liver Function Tests: No results found for this basename: AST:5,ALT:5,ALKPHOS:5,BILITOT:5,PROT:5,ALBUMIN:5 in the last 168 hours No results found for this basename: LIPASE:5,AMYLASE:5 in the last 168 hours No results found for this basename: AMMONIA:5 in the last 168 hours CBC:  Lab 06/23/12 1547  WBC 13.1*  NEUTROABS --  HGB 12.8*  HCT 38.3*  MCV 93.6  PLT 220   Cardiac Enzymes:  Lab 06/23/12 1547  CKTOTAL --  CKMB --  CKMBINDEX --  TROPONINI <0.30    BNP (last 3 results)  Basename 06/23/12 1547  PROBNP 218.7   CBG: No results found for this basename: GLUCAP:5 in the last 168 hours  Radiological Exams on Admission: Dg Chest 2 View  06/23/2012  *RADIOLOGY REPORT*  Clinical Data: Shortness of breath.  CHEST - 2 VIEW  Comparison: 06/20/2012  Findings: Left pacer remains in place, unchanged. Heart and mediastinal contours are within normal limits.  No focal opacities or effusions.  No acute bony abnormality.  Nodular  densities project over both lower lobes felt to represent nipple shadows.  IMPRESSION: No active cardiopulmonary disease.   Original Report Authenticated By: Cyndie Chime, M.D.     EKG: Independently reviewed. NSR with left axis deviation, no ST segment elevation, appears in sinus rhythm, dont see pacer spikes although he has a functioning pacemaker that was last interrogated on 04/29/12.  Assessment/Plan Principal Problem:  *COPD (chronic obstructive pulmonary disease) with acute bronchitis Active Problems:  Hypertension  Hyperlipidemia  Hearing impairment  Systolic  CHF, chronic  Leukocytosis   1. Acute exacerbation of COPD - Acute bronchitis - patient not normally on O2 at home at baseline, will admit patient, treat with IV solu-medrol, antibiotics for the COPD exacerbation will use flouroquinolone since allergic to doxycycline.  Respiratory treatments as needed, will have RT eval and treat.  Will go ahead and stop his PO prednisone and azithromycin (which was started on 9/27 and dosent seem to have been successful). 2. Leukocytosis - secondary to #1 3. Chronic systolic CHF - doubt that this is contributing much to his respiratory symptoms, sounds very wheezy and not very wet on exam, CXR dosent demonstrate any pulmonary edema.  Will hold off on fluids for now however and allow patient to take POs since he is hungry. 4. Chronic HTN, hyperlipidemia - continue home meds at this time. 5. H/o CAD - continue on tele monitoring for now, but sounds like this is quite stable from recent notes, over 1 year out, per recent Cards note can be off of plavix so will continue not to give this. 6. Possible h/o DM2 - although listed in the patients PMHx  Code Status: Full code Family Communication: No family in room, but patients wife may be contacted with questions Disposition Plan: Admit to inpatient  Time spent: 50 min  Ailene Royal M. [facility] Pager [phone]  If 7PM-7AM, please  contact night-coverage [email or web address] Password [email or web address] 06/23/2012, 8:40 PM

## 2012-06-23 NOTE — ED Provider Notes (Signed)
History   This chart was scribed for Ethelda Chick, MD by Sofie Rower. The patient was seen in room MH09/MH09 and the patient's care was started at 3:19PM    CSN: [id number]  Arrival date & time 06/23/12  1511   First MD Initiated Contact with Patient 06/23/12 1519      Chief Complaint  Patient presents with  . Shortness of Breath    (Consider location/radiation/quality/duration/timing/severity/associated sxs/prior treatment) Patient is a 78 y.o. male presenting with shortness of breath. The history is provided by the patient and the spouse. No language interpreter was used.  Shortness of Breath  The current episode started 3 to 5 days ago. The problem occurs rarely. The problem has been gradually worsening. The problem is moderate. Nothing relieves the symptoms. Nothing aggravates the symptoms. Associated symptoms include cough and shortness of breath. Pertinent negatives include no fever. The cough has no precipitants. The cough is productive. Nothing relieves the cough. Nothing worsens the cough.    WINTON OFFORD is a 78 y.o. male with a hx of bronchitis (diagnosed on Friday, 06/20/12, at [facility] by Dr. Mayford Knife, the pt was given a z-pack), who presents to the Emergency Department complaining of sudden, progressively worsening, shortness of breath, onset three days ago, with associated symptoms of productive cough. Modifying factors include application of breathing treatment and steroids which provides moderate relief of the shortness of breath. The pt has a hx of allergy to statins and penicillins.   The pt denies fever, and any swelling located at the lower extremities.   The pt does not smoke or drink alcohol.   PCP is Dr. Doristine Counter.    Past Medical History  Diagnosis Date  . Chest discomfort   . Coronary artery disease   . Diabetes mellitus   . Hypertension   . Hyperlipidemia   . Obesities, morbid   . Gallstone pancreatitis     recurrent  . TIA (transient ischemic  attack)   . Bacteremia due to vancomycin resistant Enterococcus   . GERD (gastroesophageal reflux disease)   . Hearing impairment   . Myocardial infarction, anterior wall, subsequent care   . Second degree AV block, Mobitz type II     s/p PPM by JA 4/12  . ARF (acute renal failure)   . Cholangitis   . Duodenal ulcer   . GI bleed   . Anemia associated with acute blood loss     Past Surgical History  Procedure Date  . Cholecystectomy   . Pacemaker placement 01/01/11    implanted by JA for mobitz II AV block  . Pancreatic stent     No family history on file.  History  Substance Use Topics  . Smoking status: Former Smoker -- 1.0 packs/day for 57 years    Types: Cigarettes    Quit date: 01/12/1996  . Smokeless tobacco: Never Used  . Alcohol Use: No      Review of Systems  Constitutional: Negative for fever.  Respiratory: Positive for cough and shortness of breath.   All other systems reviewed and are negative.    Allergies  Doxycycline; Statins; and Penicillins  Home Medications   No current outpatient prescriptions on file.  BP 139/95  Pulse 72  Temp 97.7 F (36.5 C) (Oral)  Resp 22  SpO2 96%  Physical Exam  Nursing note and vitals reviewed. Constitutional: He is oriented to person, place, and time. He appears well-developed and well-nourished.  HENT:  Head: Atraumatic.  Nose: Nose normal.  Mouth/Throat: Oropharynx is clear and moist.  Eyes: Conjunctivae normal and EOM are normal.  Neck: Normal range of motion. Neck supple.  Cardiovascular: Normal rate, regular rhythm and normal heart sounds.   Pulmonary/Chest: Effort normal. He has wheezes.       Coarse loud, expiratory wheezing bilaterally.   Musculoskeletal: Normal range of motion. He exhibits no edema.  Neurological: He is alert and oriented to person, place, and time.  Skin: Skin is warm and dry.  Psychiatric: He has a normal mood and affect. His behavior is normal.    ED Course  Procedures  (including critical care time)  DIAGNOSTIC STUDIES: Oxygen Saturation is 96% on room air, normal by my interpretation.    COORDINATION OF CARE:   Date: 06/23/2012  Rate: 68  Rhythm: normal sinus rhythm  QRS Axis: left axis  Intervals: normal  ST/T Wave abnormalities: nonspecific flattened t waves  Conduction Disutrbances:none  Narrative Interpretation:   Old EKG Reviewed: unchanged no significant changes compared to prior ekg of 05/08/11      3:30PM- Breathing treatment, x-ray of chest, blood work, and hospital admission discussed with patient. Pt agrees with treatment.   5:19 PM d/w [facility], pt to be admitted to Team 10, tele bed, Dr, Rito Ehrlich at [facility].  He is starting his fourth neb treatment now, continued wheezing.    5:22PM- Recheck. Pt is still wheezing. Chest x-ray and laboratory results discussed with patient.  Pt agrees with treatment.   Results for orders placed during the hospital encounter of 06/23/12  CBC      Component Value Range   WBC 13.1 (*) 4.0 - 10.5 K/uL   RBC 4.09 (*) 4.22 - 5.81 MIL/uL   Hemoglobin 12.8 (*) 13.0 - 17.0 g/dL   HCT 16.1 (*) 09.6 - 04.5 %   MCV 93.6  78.0 - 100.0 fL   MCH 31.3  26.0 - 34.0 pg   MCHC 33.4  30.0 - 36.0 g/dL   RDW 40.9  81.1 - 91.4 %   Platelets 220  150 - 400 K/uL  TROPONIN I      Component Value Range   Troponin I <0.30  <0.30 ng/mL  BASIC METABOLIC PANEL      Component Value Range   Sodium 136  135 - 145 mEq/L   Potassium 4.5  3.5 - 5.1 mEq/L   Chloride 98  96 - 112 mEq/L   CO2 24  19 - 32 mEq/L   Glucose, Bld 169 (*) 70 - 99 mg/dL   BUN 33 (*) 6 - 23 mg/dL   Creatinine, Ser 7.82  0.50 - 1.35 mg/dL   Calcium 8.8  8.4 - 95.6 mg/dL   GFR calc non Af Amer 49 (*) >90 mL/min   GFR calc Af Amer 57 (*) >90 mL/min  PRO B NATRIURETIC PEPTIDE      Component Value Range   Pro B Natriuretic peptide (BNP) 218.7  0 - 450 pg/mL  CBC      Component Value Range   WBC 10.4  4.0 - 10.5 K/uL   RBC 4.19 (*) 4.22 - 5.81  MIL/uL   Hemoglobin 13.1  13.0 - 17.0 g/dL   HCT 21.3  08.6 - 57.8 %   MCV 94.0  78.0 - 100.0 fL   MCH 31.3  26.0 - 34.0 pg   MCHC 33.2  30.0 - 36.0 g/dL   RDW 46.9  62.9 - 52.8 %   Platelets 218  150 - 400 K/uL  CREATININE,  SERUM      Component Value Range   Creatinine, Ser 1.36 (*) 0.50 - 1.35 mg/dL   GFR calc non Af Amer 47 (*) >90 mL/min   GFR calc Af Amer 54 (*) >90 mL/min   Dg Chest 2 View  06/23/2012  *RADIOLOGY REPORT*  Clinical Data: Shortness of breath.  CHEST - 2 VIEW  Comparison: 06/20/2012  Findings: Left pacer remains in place, unchanged. Heart and mediastinal contours are within normal limits.  No focal opacities or effusions.  No acute bony abnormality.  Nodular densities project over both lower lobes felt to represent nipple shadows.  IMPRESSION: No active cardiopulmonary disease.   Original Report Authenticated By: Cyndie Chime, M.D.       1. Wheezing   2. COPD (chronic obstructive pulmonary disease) with acute bronchitis   3. Hypertension   4. Leukocytosis       MDM  Pt presenting with shortness of breath and cough.  Pt has been on zithromax and prednisone over the past 3 days without improvement in sob.  Pt treated with albuterol/atrovent as well as solumedrol in ED.  EKG, labs including troponin and BNP are reassuring,  No infiltrate on CXR.  Images reviewed by me as well. Pt has PCP with [facility], but is requesting [place] [facility] for admission.  Pt d/w [facility] service and transferred by [facility].        I personally performed the services described in this documentation, which was scribed in my presence. The recorded information has been reviewed and considered.    Ethelda Chick, MD 06/24/12 Marlyne Beards

## 2012-06-24 DIAGNOSIS — R0789 Other chest pain: Secondary | ICD-10-CM

## 2012-06-24 DIAGNOSIS — R062 Wheezing: Secondary | ICD-10-CM

## 2012-06-24 DIAGNOSIS — E119 Type 2 diabetes mellitus without complications: Secondary | ICD-10-CM

## 2012-06-24 DIAGNOSIS — J440 Chronic obstructive pulmonary disease with acute lower respiratory infection: Principal | ICD-10-CM

## 2012-06-24 DIAGNOSIS — K219 Gastro-esophageal reflux disease without esophagitis: Secondary | ICD-10-CM

## 2012-06-24 DIAGNOSIS — E785 Hyperlipidemia, unspecified: Secondary | ICD-10-CM

## 2012-06-24 DIAGNOSIS — J439 Emphysema, unspecified: Secondary | ICD-10-CM | POA: Diagnosis present

## 2012-06-24 LAB — BASIC METABOLIC PANEL
BUN: 34 mg/dL — ABNORMAL HIGH (ref 6–23)
Calcium: 9.2 mg/dL (ref 8.4–10.5)
GFR calc Af Amer: 55 mL/min — ABNORMAL LOW (ref >90)
GFR calc non Af Amer: 48 mL/min — ABNORMAL LOW (ref >90)
Glucose, Bld: 163 mg/dL — ABNORMAL HIGH (ref 70–99)
Sodium: 136 mEq/L (ref 135–145)

## 2012-06-24 LAB — HEMOGLOBIN A1C
Hgb A1c MFr Bld: 6.4 % — ABNORMAL HIGH (ref <5.7)
Mean Plasma Glucose: 143 mg/dL — ABNORMAL HIGH (ref <117)
Mean Plasma Glucose: 137 mg/dL — ABNORMAL HIGH (ref <117)

## 2012-06-24 LAB — CBC
Hemoglobin: 12.1 g/dL — ABNORMAL LOW (ref 13.0–17.0)
MCH: 31.1 pg (ref 26.0–34.0)
MCHC: 33.6 g/dL (ref 30.0–36.0)
RDW: 13.3 % (ref 11.5–15.5)

## 2012-06-24 LAB — GLUCOSE, CAPILLARY: Glucose-Capillary: 156 mg/dL — ABNORMAL HIGH (ref 70–99)

## 2012-06-24 MED ORDER — IPRATROPIUM BROMIDE 0.02 % IN SOLN
0.5000 mg | RESPIRATORY_TRACT | Status: DC
  Administered 2012-06-24: 0.5 mg via RESPIRATORY_TRACT
  Filled 2012-06-24 (×2): qty 2.5

## 2012-06-24 MED ORDER — METHYLPREDNISOLONE SODIUM SUCC 125 MG IJ SOLR
60.0000 mg | Freq: Four times a day (QID) | INTRAMUSCULAR | Status: DC
  Administered 2012-06-24 – 2012-06-25 (×4): 60 mg via INTRAVENOUS
  Filled 2012-06-24 (×9): qty 0.96

## 2012-06-24 MED ORDER — ALBUTEROL SULFATE (5 MG/ML) 0.5% IN NEBU
2.5000 mg | INHALATION_SOLUTION | Freq: Four times a day (QID) | RESPIRATORY_TRACT | Status: DC
  Administered 2012-06-25 – 2012-06-26 (×5): 2.5 mg via RESPIRATORY_TRACT
  Filled 2012-06-24 (×5): qty 0.5

## 2012-06-24 MED ORDER — ASPIRIN EC 81 MG PO TBEC
162.0000 mg | DELAYED_RELEASE_TABLET | Freq: Every day | ORAL | Status: DC
  Administered 2012-06-24 – 2012-06-25 (×2): 162 mg via ORAL
  Filled 2012-06-24 (×3): qty 2

## 2012-06-24 MED ORDER — INFLUENZA VIRUS VACC SPLIT PF IM SUSP
0.5000 mL | INTRAMUSCULAR | Status: AC
  Administered 2012-06-25: 0.5 mL via INTRAMUSCULAR
  Filled 2012-06-24: qty 0.5

## 2012-06-24 MED ORDER — BUDESONIDE 0.25 MG/2ML IN SUSP
0.2500 mg | Freq: Two times a day (BID) | RESPIRATORY_TRACT | Status: DC
  Administered 2012-06-24 – 2012-06-26 (×3): 0.25 mg via RESPIRATORY_TRACT
  Filled 2012-06-24 (×7): qty 2

## 2012-06-24 MED ORDER — IPRATROPIUM BROMIDE 0.02 % IN SOLN
0.5000 mg | Freq: Four times a day (QID) | RESPIRATORY_TRACT | Status: DC
  Administered 2012-06-25 – 2012-06-26 (×5): 0.5 mg via RESPIRATORY_TRACT
  Filled 2012-06-24 (×5): qty 2.5

## 2012-06-24 MED ORDER — ALBUTEROL SULFATE (5 MG/ML) 0.5% IN NEBU: 2.5000 mg | INHALATION_SOLUTION | RESPIRATORY_TRACT | Status: DC | PRN

## 2012-06-24 MED ORDER — ALBUTEROL SULFATE (5 MG/ML) 0.5% IN NEBU
2.5000 mg | INHALATION_SOLUTION | RESPIRATORY_TRACT | Status: DC
  Administered 2012-06-24: 2.5 mg via RESPIRATORY_TRACT
  Filled 2012-06-24 (×2): qty 0.5

## 2012-06-24 MED ORDER — HYDROCHLOROTHIAZIDE 12.5 MG PO CAPS
12.5000 mg | ORAL_CAPSULE | Freq: Every day | ORAL | Status: DC
  Administered 2012-06-24 – 2012-06-26 (×3): 12.5 mg via ORAL
  Filled 2012-06-24 (×3): qty 1

## 2012-06-24 NOTE — Progress Notes (Signed)
Occupational Therapy Evaluation Patient Details Name: [name] MRN: [id number] DOB: Nov 30, 1926 Today's Date: 06/24/2012 Time: 2725-3664 OT Time Calculation (min): 27 min  OT Assessment / Plan / Recommendation Clinical Impression  78 yo with recent admission secondary to COPD exacerbation. Pt ambulated on RA. O2 Sats 94 %. Pt with unsteady gait pattern. Will benefit from use of RW. Will need 24/7 S at D/C. Rec HHOT after D/C. Pt will benefit from skilled OT acute services to max independence with ADL  and functional mobility for ADL secondary to below deficits.     OT Assessment  Patient needs continued OT Services    Follow Up Recommendations  Home health OT    Barriers to Discharge None    Equipment Recommendations  Rolling walker with 5" wheels;Tub/shower bench    Recommendations for Other Services    Frequency  Min 2X/week    Precautions / Restrictions Precautions Precautions: Fall   Pertinent Vitals/Pain No c/o pain    ADL  Eating/Feeding: Performed;Modified independent Where Assessed - Eating/Feeding: Chair Grooming: Performed;Supervision/safety Where Assessed - Grooming: Supported standing Upper Body Bathing: Simulated;Set up Where Assessed - Upper Body Bathing: Unsupported sitting Lower Body Bathing: Simulated;Supervision/safety;Set up Where Assessed - Lower Body Bathing: Supported sit to stand Upper Body Dressing: Simulated;Set up;Supervision/safety Where Assessed - Upper Body Dressing: Unsupported sitting Lower Body Dressing: Performed;Min guard;Set up;Supervision/safety Where Assessed - Lower Body Dressing: Supported sit to stand Toilet Transfer: Simulated;Min Pension scheme manager Method: Sit to stand;Stand pivot Acupuncturist: Other (comment) (bed - chair) Toileting - Clothing Manipulation and Hygiene: Simulated;Supervision/safety Where Assessed - Engineer, mining and Hygiene: Standing Tub/Shower Transfer: Simulated;Minimal  assistance Tub/Shower Transfer Method: Ambulating Equipment Used: Gait belt Transfers/Ambulation Related to ADLs: Min A. unsteady gait ADL Comments: Difficult to complete LB ADL. May benefit from AE to decrease fatigue    OT Diagnosis: Generalized weakness;Cognitive deficits  OT Problem List: Decreased strength;Decreased activity tolerance;Impaired balance (sitting and/or standing);Decreased cognition;Decreased safety awareness;Decreased knowledge of use of DME or AE;Decreased knowledge of precautions;Cardiopulmonary status limiting activity;Obesity OT Treatment Interventions: Self-care/ADL training;Energy conservation;DME and/or AE instruction;Therapeutic activities;Patient/family education;Balance training;Cognitive remediation/compensation   OT Goals Acute Rehab OT Goals OT Goal Formulation: With patient Time For Goal Achievement: 07/08/12 Potential to Achieve Goals: Good ADL Goals Pt Will Perform Lower Body Bathing: with supervision;with caregiver independent in assisting;Sit to stand from chair;Unsupported;with adaptive equipment;with cueing (comment type and amount) ADL Goal: Lower Body Bathing - Progress: Goal set today Pt Will Perform Lower Body Dressing: with supervision;with caregiver independent in assisting;Sit to stand from chair;Unsupported;with adaptive equipment;with cueing (comment type and amount) ADL Goal: Lower Body Dressing - Progress: Goal set today Pt Will Transfer to Toilet: with supervision;with DME;with caregiver independent in assisting ADL Goal: Toilet Transfer - Progress: Goal set today Pt Will Perform Tub/Shower Transfer: with supervision;with caregiver independent in assisting;Tub transfer;Ambulation;Transfer tub bench ADL Goal: Tub/Shower Transfer - Progress: Goal set today Additional ADL Goal #1: Complete functional mobility @ RW level with S during ADL. ADL Goal: Additional Goal #1 - Progress: Goal set today  Visit Information  Last OT Received On:  06/24/12 Assistance Needed: +1    Subjective Data      Prior Functioning     Home Living Lives With: Spouse Available Help at Discharge: Family;Available 24 hours/day Type of Home: House Home Access: Stairs to enter [employer] of Steps: 2 Home Layout: One level Bathroom Shower/Tub: Engineer, manufacturing systems: Standard Home Adaptive Equipment: None Prior Function Level of Independence: Independent Able to  Take Stairs?: Yes Driving: Yes Vocation: Retired Musician: HOH (even with bil hearing aids) Dominant Hand: Right         Vision/Perception     Cognition  Overall Cognitive Status: Impaired Area of Impairment: Safety/judgement Arousal/Alertness: Awake/alert Orientation Level: Appears intact for tasks assessed Behavior During Session: [facility] for tasks performed Safety/Judgement: Decreased awareness of need for assistance Safety/Judgement - Other Comments: decreased STM. decreased recall of precautions Cognition - Other Comments: safety/judgement may be affected by [facility]    Extremity/Trunk Assessment Right Upper Extremity Assessment RUE ROM/Strength/Tone: [facility] for tasks assessed Left Upper Extremity Assessment LUE ROM/Strength/Tone: WFL for tasks assessed Trunk Assessment Trunk Assessment: Normal     Mobility Bed Mobility Bed Mobility: Supine to Sit;Sitting - Scoot to Edge of Bed Supine to Sit: 6: Modified independent (Device/Increase time) Sitting - Scoot to Edge of Bed: 6: Modified independent (Device/Increase time) Transfers Transfers: Sit to Stand;Stand to Sit Sit to Stand: With upper extremity assist;From chair/3-in-1;4: Min guard Stand to Sit: 4: Min guard;With upper extremity assist;To chair/3-in-1 Details for Transfer Assistance: moves quickly     Shoulder Instructions     Exercise     Balance  Min A. Fall risk. Need AD    End of Session OT - End of Session Equipment Utilized During Treatment: Gait  belt Activity Tolerance: Patient tolerated treatment well Patient left: in chair;with call bell/phone within reach;with nursing in room Nurse Communication: Mobility status  GO     Chryl Holten,HILLARY 06/24/2012, 5:20 PM [facility], OTR/L  [phone] 06/24/2012

## 2012-06-24 NOTE — Progress Notes (Signed)
Pt admitted to the unit. Pt is alert and oriented. Pt oriented to room, staff, and call bell. Bed in lowest position. Full assessment to Epic. Call bell with in reach. Told to call for assists. Will continue to monitor.  [name] E  

## 2012-06-24 NOTE — Progress Notes (Signed)
Patient ID: [name]  male  ZOX:[id number]    DOB: [birth year]    DOA: 06/23/2012  PCP: Delorse Lek, MD  Subjective: SOB and diffusely wheezing, No chest pain, fevers or chills, nausea or abd pain  Objective: Weight change:   Intake/Output Summary (Last 24 hours) at 06/24/12 1413 Last data filed at 06/24/12 0900  Gross per 24 hour  Intake    360 ml  Output    400 ml  Net    -40 ml   Blood pressure 135/80, pulse 82, temperature 97.6 F (36.4 C), temperature source Oral, resp. rate 20, height 5\' 7"  (1.702 m), weight 89.3 kg (196 lb 13.9 oz), SpO2 94.00%.  Physical Exam: General: Alert and awake, oriented x3, not in any acute distress. HEENT: anicteric sclera, pupils reactive to light and accommodation, EOMI CVS: S1-S2 clear, no murmur rubs or gallops Chest: b/l diffuse expiratory wheezing Abdomen: soft nontender, nondistended, normal bowel sounds, no organomegaly Extremities: no cyanosis, clubbing or edema noted bilaterally Neuro: Cranial nerves II-XII intact, no focal neurological deficits  Lab Results: Basic Metabolic Panel:  Lab 06/24/12 8119 06/23/12 2157 06/23/12 1547  NA 136 -- 136  K 5.3* -- 4.5  CL 99 -- 98  CO2 27 -- 24  GLUCOSE 163* -- 169*  BUN 34* -- 33*  CREATININE 1.34 1.36* --  CALCIUM 9.2 -- 8.8  MG -- -- --  PHOS -- -- --   CBC:  Lab 06/24/12 0455 06/23/12 2157  WBC 11.1* 10.4  NEUTROABS -- --  HGB 12.1* 13.1  HCT 36.0* 39.4  MCV 92.5 94.0  PLT 231 218   Cardiac Enzymes:  Lab 06/23/12 1547  CKTOTAL --  CKMB --  CKMBINDEX --  TROPONINI <0.30   BNP: No components found with this basename: POCBNP:2 CBG:  Lab 06/24/12 1152 06/24/12 0803 06/23/12 2131  GLUCAP 156* 166* 201*      Studies/Results: Dg Chest 2 View  06/23/2012  *RADIOLOGY REPORT*  Clinical Data: Shortness of breath.  CHEST - 2 VIEW  Comparison: 06/20/2012  Findings: Left pacer remains in place, unchanged. Heart and mediastinal contours are within normal limits.   No focal opacities or effusions.  No acute bony abnormality.  Nodular densities project over both lower lobes felt to represent nipple shadows.  IMPRESSION: No active cardiopulmonary disease.   Original Report Authenticated By: Cyndie Chime, M.D.     Medications: Scheduled Meds:   . albuterol  2.5 mg Nebulization Q4H  . albuterol  5 mg Nebulization Once  . albuterol  5 mg Nebulization Once  . albuterol  5 mg Nebulization Once  . albuterol  5 mg Nebulization Once  . aspirin EC  162 mg Oral q1800  . benazepril  20 mg Oral q morning - 10a  . budesonide  0.25 mg Nebulization BID  . carvedilol  12.5 mg Oral BID  . donepezil  10 mg Oral QHS  . ezetimibe  10 mg Oral QPM  . folic acid  1 mg Oral q morning - 10a  . heparin  5,000 Units Subcutaneous Q8H  . hydrochlorothiazide  12.5 mg Oral Daily  . insulin aspart  0-15 Units Subcutaneous TID WC  . ipratropium  0.5 mg Nebulization Once  . ipratropium  0.5 mg Nebulization Once  . ipratropium  0.5 mg Nebulization Once  . ipratropium  0.5 mg Nebulization Q4H  . levofloxacin (LEVAQUIN) IV  500 mg Intravenous Q24H  . magnesium chloride  1 tablet Oral QHS  . methylPREDNISolone (  SOLU-MEDROL) injection  125 mg Intravenous Once  . methylPREDNISolone (SOLU-MEDROL) injection  60 mg Intravenous Q6H  . pantoprazole  40 mg Oral Q1200  . simvastatin  20 mg Oral QPM  . sodium chloride  3 mL Intravenous Q12H  . sodium chloride  3 mL Intravenous Q12H  . DISCONTD: albuterol  2.5 mg Nebulization Q6H  . DISCONTD: albuterol  5 mg Nebulization Once  . DISCONTD: aspirin  162.5 mg Oral QPM  . DISCONTD: hydrochlorothiazide  12.5 mg Oral q morning - 10a  . DISCONTD: ipratropium  0.5 mg Nebulization Once  . DISCONTD: ipratropium  0.5 mg Nebulization Q6H  . DISCONTD: Magnesium  1 tablet Oral QPM  . DISCONTD: methylPREDNISolone (SOLU-MEDROL) injection  40 mg Intravenous Q6H   Continuous Infusions:    Assessment/Plan: Principal Problem:  *COPD with acute  bronchitis - increase scheduled nebs to q4hrs, IV steroids, pulmicort, levaquin  Active Problems:  Hypertension: stable   Hyperlipidemia: cont statins   Systolic CHF, chronic: appears to be compensated, cont all meds inc ASA, ACEI, zetia   Leukocytosis: cont levaquin  DVT Prophylaxis: SCD's  Code Status:FULL  Disposition: not ready   LOS: 1 day   RAI,RIPUDEEP M.D. [facility] 06/24/2012, 2:13 PM Pager: [phone]  If 7PM-7AM, please contact night-coverage [email or web address] Password [facility]   Basename 06/24/12 1152 06/24/12 0803 06/23/12 2131  GLUCAP 156* 166* 201*

## 2012-06-24 NOTE — Progress Notes (Signed)
Clinical Social Worker received referral for pt needing COPD Gold Protocol. Inappropriate Clinical Social Worker referral. Visual merchandiser discussed with RNCM who will assess for pt home needs. Clinical Social Worker signing off at this time. Please re-consult if social work needs arise.  Jacklynn Lewis, MSW, LCSWA  Clinical Social Work [phone]

## 2012-06-24 NOTE — Care Management Note (Signed)
    Page 1 of 2   06/26/2012     1:28:32 PM   CARE MANAGEMENT NOTE 06/26/2012  Patient:  [name], [name]   Account Number:  [id number]  Date Initiated:  06/24/2012  Documentation initiated by:  Letha Cape  Subjective/Objective Assessment:   dx copd with acute bronchitis  admit- lives with spouse. Pt has rolling walker, bsc and shower chair at home.     Action/Plan:   pt eval- recs outpt  pt and rolling walker.   Anticipated DC Date:  06/26/2012   Anticipated DC Plan:  HOME W HOME HEALTH SERVICES      DC Planning Services  CM consult      [facility] Choice  HOME HEALTH   Choice offered to / List presented to:  C-3 Spouse        HH arranged  HH-1 RN  HH-2 PT  HH-3 OT      [facility] agency  [facility]   Status of service:  Completed, signed off Medicare Important Message given?   (If response is "NO", the following Medicare IM given date fields will be blank) Date Medicare IM given:   Date Additional Medicare IM given:    Discharge Disposition:  HOME W HOME HEALTH SERVICES  Per UR Regulation:  Reviewed for med. necessity/level of care/duration of stay  If discussed at Long Length of Stay Meetings, dates discussed:    Comments:  06/26/12 10:05 Letha Cape RN, BSN [phone] spouse states patient has a rolling walker and a BSC at home.  She states they would like to work with [facility] for [facility] services.  Referral made to [facility] for  [facility]- COPD, PT, OT, Marie notified.  Soc will begin 24-48 hrs post discharge.  06/25/12 16:03 Letha Cape RN, BSN [phone] per physical therapy recs hhpt, ot, rolling walker , 3 n 1, and hhrn.  Patient states to talk to spouse for choice of hh agency, she will be in tomorrow.  I called and left message for her to call me back.  06/24/12 14:27 Letha Cape RN,BSN [phone] patient lives with spouse, patient has bsc, shower chair and rolling walker at home.  Per physical therpay recs outpt  pt and a rolling walker.  NCM will continue to follow  for dc needs.

## 2012-06-24 NOTE — Progress Notes (Signed)
Placed on [place] for decreased spo2 taken by nt

## 2012-06-24 NOTE — Progress Notes (Signed)
INITIAL ADULT NUTRITION ASSESSMENT Date: 06/24/2012   Time: 3:12 PM Reason for Assessment: Consult-assessment  INTERVENTION: No nutrition interventions at this time.  RD will follow as needed    DOCUMENTATION CODES Per approved criteria  -Obesity unspecified      ASSESSMENT: Male 78 y.o.  Dx: COPD with acute bronchitis  Hx:  Past Medical History  Diagnosis Date  . Chest discomfort   . Coronary artery disease   . Diabetes mellitus   . Hypertension   . Hyperlipidemia   . Obesities, morbid   . Gallstone pancreatitis     recurrent  . TIA (transient ischemic attack)   . Bacteremia due to vancomycin resistant Enterococcus   . GERD (gastroesophageal reflux disease)   . Hearing impairment   . Myocardial infarction, anterior wall, subsequent care   . Second degree AV block, Mobitz type II     s/p PPM by JA 4/12  . ARF (acute renal failure)   . Cholangitis   . Duodenal ulcer   . GI bleed   . Anemia associated with acute blood loss     Past Surgical History  Procedure Date  . Cholecystectomy   . Pacemaker placement 01/01/11    implanted by JA for mobitz II AV block  . Pancreatic stent     Related Meds:     . albuterol  2.5 mg Nebulization Q4H  . albuterol  5 mg Nebulization Once  . albuterol  5 mg Nebulization Once  . albuterol  5 mg Nebulization Once  . albuterol  5 mg Nebulization Once  . aspirin EC  162 mg Oral q1800  . benazepril  20 mg Oral q morning - 10a  . budesonide  0.25 mg Nebulization BID  . carvedilol  12.5 mg Oral BID  . donepezil  10 mg Oral QHS  . ezetimibe  10 mg Oral QPM  . folic acid  1 mg Oral q morning - 10a  . heparin  5,000 Units Subcutaneous Q8H  . hydrochlorothiazide  12.5 mg Oral Daily  . insulin aspart  0-15 Units Subcutaneous TID WC  . ipratropium  0.5 mg Nebulization Once  . ipratropium  0.5 mg Nebulization Once  . ipratropium  0.5 mg Nebulization Once  . ipratropium  0.5 mg Nebulization Q4H  . levofloxacin (LEVAQUIN) IV  500 mg  Intravenous Q24H  . magnesium chloride  1 tablet Oral QHS  . methylPREDNISolone (SOLU-MEDROL) injection  125 mg Intravenous Once  . methylPREDNISolone (SOLU-MEDROL) injection  60 mg Intravenous Q6H  . pantoprazole  40 mg Oral Q1200  . simvastatin  20 mg Oral QPM  . sodium chloride  3 mL Intravenous Q12H  . sodium chloride  3 mL Intravenous Q12H  . DISCONTD: albuterol  2.5 mg Nebulization Q6H  . DISCONTD: albuterol  5 mg Nebulization Once  . DISCONTD: aspirin  162.5 mg Oral QPM  . DISCONTD: hydrochlorothiazide  12.5 mg Oral q morning - 10a  . DISCONTD: ipratropium  0.5 mg Nebulization Once  . DISCONTD: ipratropium  0.5 mg Nebulization Q6H  . DISCONTD: Magnesium  1 tablet Oral QPM  . DISCONTD: methylPREDNISolone (SOLU-MEDROL) injection  40 mg Intravenous Q6H     Ht: 5\' 7"  (170.2 cm)  Wt: 196 lb 13.9 oz (89.3 kg)  Ideal Wt: 67.3 kg  % Ideal Wt: 133%  Usual Wt:  Wt Readings from Last 5 Encounters:  06/23/12 196 lb 13.9 oz (89.3 kg)  04/29/12 197 lb 6.4 oz (89.54 kg)  12/31/11 193 lb 1.9  oz (87.599 kg)  10/31/11 194 lb 12.8 oz (88.361 kg)  07/11/11 175 lb 12.8 oz (79.742 kg)    % Usual Wt: ~100%  Body mass index is 30.83 kg/(m^2). Pt is obese class 1.   Food/Nutrition Related Hx: normal intake and appetite PTA, denies any recent weight loss   Labs:  CMP     Component Value Date/Time   NA 136 06/24/2012 0455   K 5.3* 06/24/2012 0455   CL 99 06/24/2012 0455   CO2 27 06/24/2012 0455   GLUCOSE 163* 06/24/2012 0455   BUN 34* 06/24/2012 0455   CREATININE 1.34 06/24/2012 0455   CALCIUM 9.2 06/24/2012 0455   PROT 4.5* 05/29/2011 0500   ALBUMIN 2.0* 05/29/2011 0500   AST 9 05/29/2011 0500   ALT 16 05/29/2011 0500   ALKPHOS 282* 05/29/2011 0500   BILITOT 0.5 05/29/2011 0500   GFRNONAA 48* 06/24/2012 0455   GFRAA 55* 06/24/2012 0455      Intake/Output Summary (Last 24 hours) at 06/24/12 1514 Last data filed at 06/24/12 1432  Gross per 24 hour  Intake    360 ml  Output    950 ml  Net    -590 ml     Diet Order: Cardiac  Supplements/Tube Feeding: none   IVF:    Estimated Nutritional Needs:   Kcal: 1800-2000 Protein: 85-95 Fluid: 1.8-2 L   RD consulted for assessment of nutrition requirements/status.  Pt admitted with COPD exacerbation. Pt was able to give some hx, eating well, does not follow any dietary restrictions. Wife also in room states pt has maintained his weight recently. Wife also reports "a little dementia". Pt does eat a high salt/fat diet, but would not be appropriate for education given advanced age and dementia.   NUTRITION DIAGNOSIS: none   MONITORING/EVALUATION(Goals): Goal: continue eating >75% most meals  Monitor: PO intake, weight  EDUCATION NEEDS: -Education not appropriate at this time   Clarene Duke RD, LDN Pager [phone] After Hours pager [phone]  06/24/2012, 3:12 PM

## 2012-06-24 NOTE — Evaluation (Signed)
Physical Therapy Evaluation Patient Details Name: [name] MRN: [id number] DOB: March 28, 1927 Today's Date: 06/24/2012 Time: 5621-3086 PT Time Calculation (min): 19 min  PT Assessment / Plan / Recommendation Clinical Impression  Pt admitted with COPD exacerbation. Pt sats 99% on 3L at rest, 95% on RA prior to ambulation maintained 95% on RA with ambulation and 94% on RA at rest. Pt very pleasant but HOH and demonstrates balance deficits that would benefit from further therapy to address mobility and increase independence and safety. Pt states he still drives which is very concerning given extremely HOH even with hearing aids. Will follow.     PT Assessment  Patient needs continued PT services    Follow Up Recommendations  Outpatient PT;Supervision for mobility/OOB    Barriers to Discharge None      Equipment Recommendations  Rolling walker with 5" wheels    Recommendations for Other Services     Frequency Min 3X/week    Precautions / Restrictions Precautions Precautions: Fall   Pertinent Vitals/Pain No pain HR 72-82 NSR throughout      Mobility  Bed Mobility Bed Mobility: Supine to Sit Supine to Sit: 6: Modified independent (Device/Increase time);HOB flat;With rails Transfers Transfers: Sit to Stand;Stand to Sit Sit to Stand: 5: Supervision;From bed Stand to Sit: 5: Supervision;To chair/3-in-1 Details for Transfer Assistance: cueing for safety and supervision for lines Ambulation/Gait Ambulation/Gait Assistance: 4: Min guard Ambulation Distance (Feet): 250 Feet Assistive device: None Ambulation/Gait Assistance Details: Pt with increased sway with gait and unsteady gait Gait Pattern: Step-through pattern;Wide base of support Gait velocity: decreased    Shoulder Instructions     Exercises General Exercises - Lower Extremity Long Arc Quad: AROM;Both;20 reps;Seated Hip Flexion/Marching: AROM;Both;20 reps;Seated   PT Diagnosis: Difficulty walking  PT Problem  List: Decreased activity tolerance;Decreased mobility;Decreased balance;Decreased safety awareness PT Treatment Interventions: Gait training;Stair training;DME instruction;Therapeutic exercise;Balance training;Therapeutic activities;Functional mobility training;Patient/family education   PT Goals Acute Rehab PT Goals PT Goal Formulation: With patient Time For Goal Achievement: 07/01/12 Potential to Achieve Goals: Good Pt will go Sit to Stand: with modified independence PT Goal: Sit to Stand - Progress: Goal set today Pt will go Stand to Sit: with modified independence PT Goal: Stand to Sit - Progress: Goal set today Pt will Ambulate: >150 feet;with modified independence;with least restrictive assistive device PT Goal: Ambulate - Progress: Goal set today Pt will Go Up / Down Stairs: 1-2 stairs;with min assist;with least restrictive assistive device PT Goal: Up/Down Stairs - Progress: Goal set today  Visit Information  Last PT Received On: 06/24/12 Assistance Needed: +1    Subjective Data  Subjective: 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 [place] Patient Stated Goal: go home today   Prior Functioning  Home Living Lives With: Spouse Available Help at Discharge: Family;Available 24 hours/day Type of Home: House Home Access: Stairs to enter [employer] of Steps: 2 Home Layout: One level Bathroom Shower/Tub: Network engineer: None Prior Function Level of Independence: Independent Able to Take Stairs?: Yes Driving: Yes Vocation: Retired Comments: Pt states he sits all the way down in the tub to bathe Communication Communication: HOH (even with bil hearing aids)    Cognition  Overall Cognitive Status: Impaired Area of Impairment: Safety/judgement Arousal/Alertness: Awake/alert Orientation Level: Appears intact for tasks assessed Behavior During Session: [facility] for tasks performed Safety/Judgement: Decreased awareness of need for  assistance    Extremity/Trunk Assessment Right Lower Extremity Assessment RLE ROM/Strength/Tone: [facility] for tasks assessed Left  Lower Extremity Assessment LLE ROM/Strength/Tone: WFL for tasks assessed Trunk Assessment Trunk Assessment: Normal   Balance Static Sitting Balance Static Sitting - Balance Support: No upper extremity supported;Feet supported Static Sitting - Level of Assistance: 6: Modified independent (Device/Increase time) Static Sitting - Comment/# of Minutes: 2 Static Standing Balance Static Standing - Balance Support: No upper extremity supported Static Standing - Level of Assistance: 5: Stand by assistance Static Standing - Comment/# of Minutes: 1  End of Session PT - End of Session Activity Tolerance: Patient tolerated treatment well Patient left: in chair;with call bell/phone within reach Nurse Communication: Mobility status  GP     Delorse Lek 06/24/2012, 8:28 AM  Delaney Meigs, PT [phone]

## 2012-06-24 NOTE — Progress Notes (Signed)
Dr Isidoro Donning notified via text message to pager of pt's K+ level of 5.3 this am.

## 2012-06-25 LAB — GLUCOSE, CAPILLARY
Glucose-Capillary: 185 mg/dL — ABNORMAL HIGH (ref 70–99)
Glucose-Capillary: 204 mg/dL — ABNORMAL HIGH (ref 70–99)

## 2012-06-25 MED ORDER — PREDNISONE 20 MG PO TABS
40.0000 mg | ORAL_TABLET | Freq: Every day | ORAL | Status: DC
  Filled 2012-06-25: qty 2

## 2012-06-25 MED ORDER — PREDNISONE 20 MG PO TABS: 20.0000 mg | ORAL_TABLET | Freq: Every day | ORAL | Status: DC

## 2012-06-25 MED ORDER — PREDNISONE 50 MG PO TABS
50.0000 mg | ORAL_TABLET | Freq: Every day | ORAL | Status: DC
  Filled 2012-06-25: qty 1

## 2012-06-25 MED ORDER — LEVOFLOXACIN IN D5W 250 MG/50ML IV SOLN
250.0000 mg | INTRAVENOUS | Status: DC
  Administered 2012-06-25: 250 mg via INTRAVENOUS
  Filled 2012-06-25 (×2): qty 50

## 2012-06-25 MED ORDER — PREDNISONE 20 MG PO TABS: 30.0000 mg | ORAL_TABLET | Freq: Every day | ORAL | Status: DC

## 2012-06-25 MED ORDER — METOPROLOL TARTRATE 50 MG PO TABS
50.0000 mg | ORAL_TABLET | Freq: Two times a day (BID) | ORAL | Status: DC
  Administered 2012-06-25 – 2012-06-26 (×2): 50 mg via ORAL
  Filled 2012-06-25 (×3): qty 1

## 2012-06-25 MED ORDER — PREDNISONE 10 MG PO TABS: 10.0000 mg | ORAL_TABLET | Freq: Every day | ORAL | Status: DC

## 2012-06-25 MED ORDER — PREDNISONE 50 MG PO TABS
50.0000 mg | ORAL_TABLET | Freq: Every day | ORAL | Status: AC
  Administered 2012-06-26: 50 mg via ORAL
  Filled 2012-06-25: qty 1

## 2012-06-25 NOTE — Progress Notes (Signed)
Physical Therapy Treatment Patient Details Name: [name] MRN: [id number] DOB: [birth year] Today's Date: 06/25/2012 Time: 4098-1191 PT Time Calculation (min): 20 min  PT Assessment / Plan / Recommendation Comments on Treatment Session  Patient did well with RW with gait today.     Follow Up Recommendations  Outpatient PT;Supervision for mobility/OOB    Barriers to Discharge        Equipment Recommendations  Rolling walker with 5" wheels    Recommendations for Other Services    Frequency Min 3X/week   Plan Discharge plan remains appropriate;Frequency remains appropriate    Precautions / Restrictions Precautions Precautions: Fall Restrictions Weight Bearing Restrictions: No       Mobility  Transfers Transfers: Sit to Stand;Stand to Sit Sit to Stand: 4: Min guard;With upper extremity assist;With armrests;From chair/3-in-1 Stand to Sit: 4: Min guard;With upper extremity assist;With armrests;To chair/3-in-1 Details for Transfer Assistance: Verbal cues to move slowly through transitions for safety. Ambulation/Gait Ambulation/Gait Assistance: 4: Min guard Ambulation Distance (Feet): 250 Feet Assistive device: Rolling walker Ambulation/Gait Assistance Details: Verbal cues to slow gait to a safe speed.  Cues to stand with upright posture, and stay close to RW. Gait Pattern: Step-through pattern;Trunk flexed;Wide base of support      PT Goals Acute Rehab PT Goals PT Goal: Sit to Stand - Progress: Progressing toward goal PT Goal: Stand to Sit - Progress: Progressing toward goal PT Goal: Ambulate - Progress: Progressing toward goal  Visit Information  Last PT Received On: 06/25/12 Assistance Needed: +1    Subjective Data  Subjective: "I want to go home today"   Cognition  Overall Cognitive Status: Impaired Area of Impairment: Safety/judgement Arousal/Alertness: Awake/alert Orientation Level: Appears intact for tasks assessed Behavior During Session: [facility] for  tasks performed Safety/Judgement: Decreased awareness of need for assistance Safety/Judgement - Other Comments: Patient initiated standing before RW placed in front of him.  He did stop himself once he remembered that he needed the RW.    Balance     End of Session PT - End of Session Equipment Utilized During Treatment: Gait belt Activity Tolerance: Patient tolerated treatment well Patient left: in chair;with call bell/phone within reach;with family/visitor present Nurse Communication: Mobility status   GP     Vena Austria 06/25/2012, 12:48 PM Durenda Hurt. Renaldo Fiddler, [facility] Acute Rehab Services Pager [phone]

## 2012-06-25 NOTE — Progress Notes (Signed)
Occupational Therapy Treatment Patient Details Name: [name] MRN: [id number] DOB: 1927-05-12 Today's Date: 06/25/2012 Time: 1115-1200 OT Time Calculation (min): 45 min  OT Assessment / Plan / Recommendation Comments on Treatment Session Pt on #L O2 in room. O2 removed. O2 sats remained @94 . Ambulated @ 100 feet during functional task. O2 sats decreased to 90, but rebounded to 94 after , 1 min rest break.  Pt safer with RW, but will require 24/7 S after CD/C. Wife verbalized understanding. Pt c/o auditory hallucinations. Wife reports this is normal. Per wife, pt with baseline dementia.    Follow Up Recommendations  Home health OT    Barriers to Discharge       Equipment Recommendations  None recommended by OT    Recommendations for Other Services    Frequency Min 2X/week   Plan Discharge plan remains appropriate    Precautions / Restrictions Precautions Precautions: Fall Restrictions Weight Bearing Restrictions: No   Pertinent Vitals/Pain No c/o pain. Reports hearing Gospel music in his ears.    ADL  Lower Body Dressing: Performed;Min guard Where Assessed - Lower Body Dressing: Supported sit to stand Equipment Used: Gait belt Transfers/Ambulation Related to ADLs: Min A. unsteady gait with RW ADL Comments: Gait improved with RW, however, pt is unsafe at times and requires 24/7 S. Wife present for session.Discussed safety concerns with wife, who stated that her husband has early dementia. Cognitive defocots apparent dueing this session.    OT Diagnosis:    OT Problem List:   OT Treatment Interventions:     OT Goals Acute Rehab OT Goals OT Goal Formulation: With patient Time For Goal Achievement: 07/08/12 Potential to Achieve Goals: Good ADL Goals Pt Will Perform Lower Body Bathing: with supervision;with caregiver independent in assisting;Sit to stand from chair;Unsupported;with adaptive equipment;with cueing (comment type and amount) ADL Goal: Lower Body Bathing -  Progress: Partly met Pt Will Perform Lower Body Dressing: with supervision;with caregiver independent in assisting;Sit to stand from chair;Unsupported;with adaptive equipment;with cueing (comment type and amount) ADL Goal: Lower Body Dressing - Progress: Progressing toward goals Pt Will Transfer to Toilet: with supervision;with DME;with caregiver independent in assisting ADL Goal: Toilet Transfer - Progress: Progressing toward goals Pt Will Perform Tub/Shower Transfer: with supervision;with caregiver independent in assisting;Tub transfer;Ambulation;Transfer tub bench ADL Goal: Tub/Shower Transfer - Progress: Progressing toward goals Additional ADL Goal #1: Complete functional mobility @ RW level with S during ADL. ADL Goal: Additional Goal #1 - Progress: Progressing toward goals  Visit Information  Last OT Received On: 06/25/12 Assistance Needed: +1    Subjective Data      Prior Functioning       Cognition  Overall Cognitive Status: Impaired Area of Impairment: Safety/judgement Arousal/Alertness: Awake/alert Orientation Level: Appears intact for tasks assessed Behavior During Session: [facility] for tasks performed Safety/Judgement: Decreased awareness of need for assistance Safety/Judgement - Other Comments: Patient initiated standing before RW placed in front of him.  He did stop himself once he remembered that he needed the RW. Cognition - Other Comments: safety/judgement may be affected by [facility]    Mobility  Shoulder Instructions Bed Mobility Bed Mobility: Supine to Sit;Sitting - Scoot to Edge of Bed Supine to Sit: 6: Modified independent (Device/Increase time) Sitting - Scoot to Edge of Bed: 6: Modified independent (Device/Increase time) Transfers Transfers: Sit to Stand;Stand to Sit Sit to Stand: 5: Supervision;With upper extremity assist;From chair/3-in-1 Stand to Sit: 5: Supervision;With upper extremity assist;To chair/3-in-1 Details for Transfer Assistance: vc for safe hand  placement  and use of RW       Exercises      Balance     End of Session OT - End of Session Equipment Utilized During Treatment: Gait belt Activity Tolerance: Patient tolerated treatment well Patient left: in chair;with call bell/phone within reach;with family/visitor present Nurse Communication: Mobility status  GO     Kenson Groh,HILLARY 06/25/2012, 2:18 PM

## 2012-06-25 NOTE — Clinical Documentation Improvement (Signed)
RENAL FAILURE DOCUMENTATION CLARIFICATION QUERY  THIS DOCUMENT IS NOT A PERMANENT PART OF THE MEDICAL RECORD  TO RESPOND TO THE THIS QUERY, FOLLOW THE INSTRUCTIONS BELOW:  1. If needed, update documentation for the patient's encounter via the notes activity.  2. Access this query again and click edit on the In [employer].  3. After updating, or not, click F2 to complete all highlighted (required) fields concerning your review. Select "additional documentation in the medical record" OR "no additional documentation provided".  4. Click Sign note button.  5. The deficiency will fall out of your In Basket *Please let us know if you are not able to complete this workflow by phone or e-mail (listed below).  Please update your documentation within the medical record to reflect your response to this query.                                                                                    06/25/12  Dear Dr. Judie Petit. [name]   In a better effort to capture your patient's severity of illness, reflect appropriate length of stay and utilization of resources, a review of the patient medical record has revealed the following indicators.    Based on your clinical judgment, please clarify and document in a progress note and/or discharge summary the clinical condition associated with the following supporting information:  In responding to this query please exercise your independent judgment.  The fact that a query is asked, does not imply that any particular answer is desired or expected.  Noted patient with h/o "Acute Renal Failure" from previous admissions. Presents now with BUN 33/ Crt 1.30 (9/30) and 34/ 1.34 (10/1) .  Would any of the following diagnosis be  appropriate if so please document.  Thank you   Possible Clinical Conditions?   Acute Renal Failure  Acute Kidney Injury  Acute on Chronic Renal Failure  Chronic Kidney disease ( document stage 1-5)  Other Condition  Cannot  Clinically Determine     Risk Factors : h/o arf, dm, htn, MI, second degree AV block    Baseline Cr Level : 0.92 ( 05/29/2011)  Current Creatinine Level (trend): 1.30 (9/30)/ 1.34 ( 10/1)  Baseline BUN Level : 13 (05/29/2011)       Current BUN Level (trend): 33 (9/30)/ 34 (10/1)     Electrolytes: GFR: >60 (05/29/11) / 47/ 48 (10/10/02/28)   Treatments: monitoring BMET x 2 days                    You may use possible, probable, or suspect with inpatient documentation. possible, probable, suspected diagnoses MUST be documented at the time of discharge  Reviewed: additional documentation in the medical record  Thank You,  Leonette Most Addison  Clinical Documentation Specialist:  Pager  Health Information Management [place]

## 2012-06-25 NOTE — Progress Notes (Signed)
Addendum  Patient seen and examined, chart and data base reviewed.  I agree with the above assessment and plan  For full details please see Mrs. Algis Downs PA. Note.  COPD exacerbation, extremely hard of hearing, auditory hallucinations.Clint Lipps Pager: [phone] 06/25/2012, 3:45 PM

## 2012-06-25 NOTE — Progress Notes (Signed)
Patient ID: [name]  male  UJW:[id number]    DOB: [birth year]    DOA: 06/23/2012  PCP: Delorse Lek, MD  Subjective: Reports he hears "[place]" continuously in his head.  Also sees people in his home who are not there.  This has been going on for over a week.  Denies chest pain, fevers or chills, nausea or abd pain.  Reports he had a good BM yesterday.  Objective: Weight change:   Intake/Output Summary (Last 24 hours) at 06/25/12 1514 Last data filed at 06/25/12 1056  Gross per 24 hour  Intake    463 ml  Output   1475 ml  Net  -1012 ml   Blood pressure 134/68, pulse 66, temperature 98.4 F (36.9 C), temperature source Oral, resp. rate 20, height 5\' 7"  (1.702 m), weight 89.3 kg (196 lb 13.9 oz), SpO2 95.00%.  Physical Exam: General: Alert and awake, oriented x3, not in any acute distress. Very Hard of hearing HEENT: anicteric sclera, pupils reactive to light and accommodation, EOMI CVS: S1-S2 clear, no murmur rubs or gallops Chest: b/l diffuse expiratory wheezing Abdomen: soft nontender, nondistended, normal bowel sounds, no organomegaly Extremities: no cyanosis, clubbing or edema noted bilaterally Neuro: Cranial nerves II-XII intact, no focal neurological deficits  Lab Results: Basic Metabolic Panel:  Lab 06/24/12 5621 06/23/12 2157 06/23/12 1547  NA 136 -- 136  K 5.3* -- 4.5  CL 99 -- 98  CO2 27 -- 24  GLUCOSE 163* -- 169*  BUN 34* -- 33*  CREATININE 1.34 1.36* --  CALCIUM 9.2 -- 8.8  MG -- -- --  PHOS -- -- --   CBC:  Lab 06/24/12 0455 06/23/12 2157  WBC 11.1* 10.4  NEUTROABS -- --  HGB 12.1* 13.1  HCT 36.0* 39.4  MCV 92.5 94.0  PLT 231 218   Cardiac Enzymes:  Lab 06/23/12 1547  CKTOTAL --  CKMB --  CKMBINDEX --  TROPONINI <0.30   CBG:  Lab 06/25/12 1159 06/25/12 0754 06/24/12 2115 06/24/12 1729 06/24/12 1152  GLUCAP 184* 185* 256* 216* 156*      Studies/Results: Dg Chest 2 View  06/23/2012  *RADIOLOGY REPORT*  Clinical Data:  Shortness of breath.  CHEST - 2 VIEW  Comparison: 06/20/2012  Findings: Left pacer remains in place, unchanged. Heart and mediastinal contours are within normal limits.  No focal opacities or effusions.  No acute bony abnormality.  Nodular densities project over both lower lobes felt to represent nipple shadows.  IMPRESSION: No active cardiopulmonary disease.   Original Report Authenticated By: Cyndie Chime, M.D.     Medications: Scheduled Meds:    . albuterol  2.5 mg Nebulization Q6H  . aspirin EC  162 mg Oral q1800  . benazepril  20 mg Oral q morning - 10a  . budesonide  0.25 mg Nebulization BID  . donepezil  10 mg Oral QHS  . ezetimibe  10 mg Oral QPM  . folic acid  1 mg Oral q morning - 10a  . heparin  5,000 Units Subcutaneous Q8H  . hydrochlorothiazide  12.5 mg Oral Daily  . influenza  inactive virus vaccine  0.5 mL Intramuscular Tomorrow-1000  . insulin aspart  0-15 Units Subcutaneous TID WC  . ipratropium  0.5 mg Nebulization Q6H  . levofloxacin (LEVAQUIN) IV  250 mg Intravenous Q24H  . magnesium chloride  1 tablet Oral QHS  . metoprolol tartrate  50 mg Oral BID  . pantoprazole  40 mg Oral Q1200  .  predniSONE  10 mg Oral Q breakfast  . predniSONE  20 mg Oral Q breakfast  . predniSONE  30 mg Oral Q breakfast  . predniSONE  40 mg Oral Q breakfast  . predniSONE  50 mg Oral Q breakfast  . simvastatin  20 mg Oral QPM  . sodium chloride  3 mL Intravenous Q12H  . sodium chloride  3 mL Intravenous Q12H  . DISCONTD: albuterol  2.5 mg Nebulization Q4H  . DISCONTD: carvedilol  12.5 mg Oral BID  . DISCONTD: ipratropium  0.5 mg Nebulization Q4H  . DISCONTD: levofloxacin (LEVAQUIN) IV  500 mg Intravenous Q24H  . DISCONTD: methylPREDNISolone (SOLU-MEDROL) injection  60 mg Intravenous Q6H  . DISCONTD: predniSONE  50 mg Oral Q breakfast   Continuous Infusions:    Assessment/Plan: Principal Problem:  *COPD with acute bronchitis increase scheduled nebs to q4hrs, continuing levaquin,  d/c solumedrol start PO prednisone taper.  Active Problems:  Auditory Hallucinations.  Patient reports he is still having them today.  Possibly a reaction to the prednisone that was started prior to admission.   Or possibly hypoxia.  Will D/C IV steroids today and start PO prednisone taper.  Patient aware that these hallucinations are not real.  If these worsen will consider psychiatric consultation.  Hypertension: stable.  Have changed Coreg to Metoprolol given breathing difficulties.  Hyperlipidemia: cont statins  Systolic CHF, chronic: appears to be compensated, cont all meds inc ASA, ACEI, zetia  Leukocytosis: worsened by steroids.  cont levaquin  Mild Hyperkalemia.  Will recheck bmet in am.  Hyperglycemia.  Steroid induced.  On insulin  Muscle Spasms in neck.  Patient requested a spoon full of mustard which seemed to help  DVT Prophylaxis: SCD's  Code Status:FULL  Disposition: not ready   LOS: 2 days   Stephani Police M.D. [facility] 06/25/2012, 3:14 PM Pager: [phone]  If 7PM-7AM, please contact night-coverage [email or web address] Password [facility]   Basename 06/25/12 1159 06/25/12 0754 06/24/12 2115 06/24/12 1729 06/24/12 1152 06/24/12 0803  GLUCAP 184* 185* 256* 216* 156* 166*

## 2012-06-25 NOTE — Progress Notes (Signed)
Brought rolling walker and 3in1 to patient's room.  Patient stated he has both items at home.

## 2012-06-26 DIAGNOSIS — I10 Essential (primary) hypertension: Secondary | ICD-10-CM

## 2012-06-26 LAB — BASIC METABOLIC PANEL
CO2: 26 mEq/L (ref 19–32)
Chloride: 99 mEq/L (ref 96–112)
GFR calc Af Amer: 55 mL/min — ABNORMAL LOW (ref >90)
Potassium: 3.7 mEq/L (ref 3.5–5.1)
Sodium: 135 mEq/L (ref 135–145)

## 2012-06-26 LAB — GLUCOSE, CAPILLARY: Glucose-Capillary: 148 mg/dL — ABNORMAL HIGH (ref 70–99)

## 2012-06-26 MED ORDER — PREDNISONE (PAK) 10 MG PO TABS: 10.0000 mg | ORAL_TABLET | Freq: Every day | ORAL | Status: DC

## 2012-06-26 MED ORDER — ALBUTEROL SULFATE HFA 108 (90 BASE) MCG/ACT IN AERS: 2.0000 | INHALATION_SPRAY | Freq: Four times a day (QID) | RESPIRATORY_TRACT | Status: DC | PRN

## 2012-06-26 MED ORDER — ALBUTEROL SULFATE (5 MG/ML) 0.5% IN NEBU: 2.5000 mg | INHALATION_SOLUTION | RESPIRATORY_TRACT | Status: DC | PRN

## 2012-06-26 MED ORDER — TIOTROPIUM BROMIDE MONOHYDRATE 18 MCG IN CAPS: 18.0000 ug | ORAL_CAPSULE | Freq: Every day | RESPIRATORY_TRACT | Status: DC

## 2012-06-26 MED ORDER — BUDESONIDE 0.25 MG/2ML IN SUSP: 0.2500 mg | Freq: Two times a day (BID) | RESPIRATORY_TRACT | Status: DC

## 2012-06-26 MED ORDER — LEVOFLOXACIN 250 MG PO TABS: 250.0000 mg | ORAL_TABLET | Freq: Every day | ORAL | Status: DC

## 2012-06-26 MED ORDER — METOPROLOL TARTRATE 50 MG PO TABS: 50.0000 mg | ORAL_TABLET | Freq: Two times a day (BID) | ORAL | Status: DC

## 2012-06-26 NOTE — Progress Notes (Signed)
Physical Therapy Treatment Patient Details Name: [name] MRN: [id number] DOB: 09-Sep-1927 Today's Date: 06/26/2012 Time: 4098-1191 PT Time Calculation (min): 25 min  PT Assessment / Plan / Recommendation Comments on Treatment Session  Pt admitted with COPD and progressing with mobility. Pt states understanding to use RW all the time to prevent unsteady gait and to not be driving. Will continue to follow.     Follow Up Recommendations       Barriers to Discharge        Equipment Recommendations       Recommendations for Other Services    Frequency     Plan Discharge plan remains appropriate;Frequency remains appropriate    Precautions / Restrictions Precautions Precautions: Fall   Pertinent Vitals/Pain No pain    Mobility  Bed Mobility Bed Mobility: Supine to Sit Supine to Sit: 6: Modified independent (Device/Increase time);HOB flat Sitting - Scoot to Edge of Bed: 6: Modified independent (Device/Increase time) Transfers Sit to Stand: 5: Supervision;From bed Stand to Sit: 5: Supervision;With armrests;To chair/3-in-1 Details for Transfer Assistance: cueing for safety and hand placement Ambulation/Gait Ambulation/Gait Assistance: 4: Min guard Ambulation Distance (Feet): 500 Feet Assistive device: Rolling walker Ambulation/Gait Assistance Details: cueing for safe speed on second half of gait. Pt used good positioning in RW today Gait Pattern: Step-through pattern;Trunk flexed    Exercises General Exercises - Lower Extremity Long Arc Quad: AROM;Both;20 reps;Seated Hip ABduction/ADduction: AROM;Both;20 reps;Seated Hip Flexion/Marching: AROM;Both;20 reps;Seated   PT Diagnosis:    PT Problem List:   PT Treatment Interventions:     PT Goals Acute Rehab PT Goals PT Goal: Sit to Stand - Progress: Progressing toward goal PT Goal: Stand to Sit - Progress: Progressing toward goal PT Goal: Ambulate - Progress: Progressing toward goal  Visit Information  Last PT  Received On: 06/26/12 Assistance Needed: +1    Subjective Data  Subjective: Oh, I just love onions   Cognition  Overall Cognitive Status: Impaired Area of Impairment: Safety/judgement Arousal/Alertness: Awake/alert Orientation Level: Appears intact for tasks assessed Behavior During Session: [facility] for tasks performed Safety/Judgement: Decreased safety judgement for tasks assessed Safety/Judgement - Other Comments: Pt with safe standing and appropriate use of Rw standing from bed but when starting to turn from the sink forgot about its placement and required cueing not to fall over RW    Balance  Static Sitting Balance Static Sitting - Balance Support: Feet supported;No upper extremity supported Static Sitting - Level of Assistance: 6: Modified independent (Device/Increase time) Static Sitting - Comment/# of Minutes: 1 Dynamic Sitting Balance Dynamic Sitting - Balance Support: Feet supported;No upper extremity supported Dynamic Sitting - Level of Assistance: 5: Stand by assistance Dynamic Sitting Balance - Compensations: 3- pt with LOB to left while trying to don slippers over socks using both hands on slipper and crossing right leg, no deficits when putting on left slipper Static Standing Balance Static Standing - Balance Support: No upper extremity supported Static Standing - Level of Assistance: 5: Stand by assistance Static Standing - Comment/# of Minutes: 4  End of Session PT - End of Session Equipment Utilized During Treatment: Gait belt Activity Tolerance: Patient tolerated treatment well Patient left: in chair;with call bell/phone within reach Nurse Communication: Mobility status   GP     Delorse Lek 06/26/2012, 9:54 AM Delaney Meigs, PT [phone]

## 2012-06-26 NOTE — Discharge Summary (Signed)
Physician Discharge Summary  [name]:[id number] DOB: Jan 10, 1927 DOA: 06/23/2012  PCP: Delorse Lek, MD  Admit date: 06/23/2012 Discharge date: 06/26/2012  Recommendations for Outpatient Follow-up:   Evaluate COPD status to determine if additional medications (advair?) or referral to pulmonary specialist is appropriate. Have hearing re-evaluated.  Still very hard of hearing despite hearing aids. Check blood pressure as medications have been changed. Will have Home Health Services at discharge.  Discharge Diagnoses:  Principal Problem:  *COPD with acute bronchitis Active Problems:  Hypertension  Hyperlipidemia  Hearing impairment  Systolic CHF, chronic  Leukocytosis    Discharge Condition: much improved.  Breathing much more easily.  Able to ambulate about the room and perform ADLs  Diet recommendation: Heart Healthy  History of present illness: [name] is a 78 y.o. male with a long history of COPD and exacerbations of chronic bronchitis in the past. The patient notes he has been short of breath for at least 3-5 days this time. Symptoms are primarily wheezing, SOB, dry and non-productive cough, nothing relieves his cough nor worsens it.  Patient saw PCP who started prednisone for the bronchitis exacerbation on Friday, but wasn't really getting better so he went to the ED this evening and was sent over for admission. Patient not normally on O2 at baseline at home.  Hospital Course:   COPD with acute bronchitis.  Mr. [name] was placed on IV levaquin and will continue levaquin po as an outpatient for a full 7 day course.  He was treated with IV solumedrol initially and was progressed to a po prednisone taper.  He was on scheduled nebulizers in house but will be transitioned to Spiriva and an albuterol inhaler as an out patient.  Physical therapy worked with him as an inpatient.  After treatment they found his ambulating oxygen saturations to be 94%.    Auditory  Hallucinations. Patient reports he is still having them today. He hears "[place]" continuously in his head.  He seems to sundown somewhat as his behavior becomes for difficult at night.  This is possibly a reaction to the steroids.  Consequently we will taper his prednisone quickly.  His wife questions whether or not these are related to his dementia.  If his hallucinations worsen we would recommend psychiatric consultation.  Hypertension: stable. Have changed Coreg to Metoprolol given breathing difficulties.   Hyperlipidemia: on statins  Systolic CHF, chronic: appears to be compensated, cont all meds inc ASA, ACEI, zetia   Leukocytosis: worsened by steroids. cont levaquin   Hyperglycemia. Steroid induced. Was on insulin as an inpatient.  Will quickly taper oral steroids as an outpatient.  Muscle Spasms in neck. Patient requested a spoon full of mustard which seemed to help   Procedures:  none  Consultations:  PT / OT  Discharge Exam: Filed Vitals:   06/26/12 0509  BP: 160/92  Pulse: 50  Temp: 98.3 F (36.8 C)  Resp: 14   Filed Vitals:   06/25/12 2301 06/26/12 0207 06/26/12 0509 06/26/12 0948  BP: 130/68  160/92   Pulse:  76 50   Temp:   98.3 F (36.8 C)   TempSrc:   Oral   Resp:  20 14   Height:      Weight:      SpO2:  96% 95% 94%   General: A&O, very HOH, standing, brushing his teeth. Cardiovascular: rrr, no m/r/g Respiratory: clear today.  No crackles, wheeze or rals Extremities:  No lower extremity edema Psychiatric:  A&O, Cooperative, well groomed.  Still hears "[place]" in his head, but knows it is a hallucination.  Discharge Instructions      Discharge Orders    Future Appointments: Provider: Department: Dept Phone: Center:   07/07/2012 10:50 AM [facility] [phone] [facility]     Future Orders Please Complete By Expires   Diet - low sodium heart healthy      Increase activity slowly            Medication List     As of 06/26/2012 10:26 AM    STOP taking these medications         azithromycin 250 MG tablet   Commonly known as: ZITHROMAX      carvedilol 12.5 MG tablet   Commonly known as: COREG      predniSONE 20 MG tablet   Commonly known as: DELTASONE      TAKE these medications         albuterol 108 (90 BASE) MCG/ACT inhaler   Commonly known as: PROVENTIL HFA;VENTOLIN HFA   Inhale 2 puffs into the lungs every 6 (six) hours as needed for wheezing.      aspirin 325 MG tablet   Take 162.5 mg by mouth every evening.      benazepril 20 MG tablet   Commonly known as: LOTENSIN   Take 20 mg by mouth every morning.      CENTRUM SILVER PO   Take 1 tablet by mouth every morning.      donepezil 10 MG tablet   Commonly known as: ARICEPT   Take 10 mg by mouth at bedtime.      ezetimibe 10 MG tablet   Commonly known as: ZETIA   Take 10 mg by mouth every evening.      folic acid 1 MG tablet   Commonly known as: FOLVITE   Take 1 mg by mouth every morning.      hydrochlorothiazide 25 MG tablet   Commonly known as: HYDRODIURIL   Take 12.5 mg by mouth every morning.      levofloxacin 250 MG tablet   Commonly known as: LEVAQUIN   Take 1 tablet (250 mg total) by mouth daily.      Magnesium 500 MG Tabs   Take 1 tablet by mouth every evening.      metoprolol 50 MG tablet   Commonly known as: LOPRESSOR   Take 1 tablet (50 mg total) by mouth 2 (two) times daily.      nitroGLYCERIN 0.4 MG SL tablet   Commonly known as: NITROSTAT   Place 0.4 mg under the tongue every 5 (five) minutes as needed. For chest pain      omeprazole 20 MG capsule   Commonly known as: PRILOSEC   Take 20 mg by mouth 2 (two) times daily.      predniSONE 10 MG tablet   Commonly known as: STERAPRED UNI-PAK   Take 1 tablet (10 mg total) by mouth daily. Take 4 tabs with breakfast on Friday 10/4  Take 3 tabs with breakfast on Saturday 10/5  Take 2 tabs with breakfast on Sunday  10/6  Take 1 tab with breakfast on Monday 10/7.  Then stop.      simvastatin 20 MG tablet   Commonly known as: ZOCOR   Take 20 mg by mouth every evening.      tiotropium 18 MCG inhalation capsule   Commonly known as: SPIRIVA   Place 1  capsule (18 mcg total) into inhaler and inhale daily.         Follow-up Information    Follow up with BURNETT,BRENT A, MD. Schedule an appointment as soon as possible for a visit in 1 week.   Contact information:   [address] [place] [address] [phone]           The results of significant diagnostics from this hospitalization (including imaging, microbiology, ancillary and laboratory) are listed below for reference.    Significant Diagnostic Studies: Dg Chest 2 View  06/23/2012  *RADIOLOGY REPORT*  Clinical Data: Shortness of breath.  CHEST - 2 VIEW  Comparison: 06/20/2012  Findings: Left pacer remains in place, unchanged. Heart and mediastinal contours are within normal limits.  No focal opacities or effusions.  No acute bony abnormality.  Nodular densities project over both lower lobes felt to represent nipple shadows.  IMPRESSION: No active cardiopulmonary disease.   Original Report Authenticated By: Cyndie Chime, M.D.     Microbiology: No results found for this or any previous visit (from the past 240 hour(s)).   Labs: Basic Metabolic Panel:  Lab 06/26/12 2841 06/24/12 0455 06/23/12 2157 06/23/12 1547  NA 135 136 -- 136  K 3.7 5.3* -- 4.5  CL 99 99 -- 98  CO2 26 27 -- 24  GLUCOSE 164* 163* -- 169*  BUN 36* 34* -- 33*  CREATININE 1.35 1.34 1.36* 1.30  CALCIUM 9.1 9.2 -- 8.8  MG -- -- -- --  PHOS -- -- -- --   CBC:  Lab 06/24/12 0455 06/23/12 2157 06/23/12 1547  WBC 11.1* 10.4 13.1*  NEUTROABS -- -- --  HGB 12.1* 13.1 12.8*  HCT 36.0* 39.4 38.3*  MCV 92.5 94.0 93.6  PLT 231 218 220   Cardiac Enzymes:  Lab 06/23/12 1547  CKTOTAL --  CKMB --  CKMBINDEX --  TROPONINI <0.30   BNP: BNP (last 3  results)  Basename 06/23/12 1547  PROBNP 218.7   CBG:  Lab 06/26/12 0815 06/25/12 2225 06/25/12 1648 06/25/12 1159 06/25/12 0754  GLUCAP 148* 204* 231* 184* 185*    Time coordinating discharge:  40 min. Signed:  Algis Downs, PA-C [facility] Pager: [phone]   06/26/2012, 10:26 AM

## 2012-06-26 NOTE — Progress Notes (Signed)
[name] to be D/C'd Home per MD order.  Discussed with the patient and all questions fully answered.   Xzavier, Swinger  Home Medication Instructions [id number]   Printed on:06/26/12 1515  Medication Information                    nitroGLYCERIN (NITROSTAT) 0.4 MG SL tablet Place 0.4 mg under the tongue every 5 (five) minutes as needed. For chest pain           hydrochlorothiazide 25 MG tablet Take 12.5 mg by mouth every morning.            omeprazole (PRILOSEC) 20 MG capsule Take 20 mg by mouth 2 (two) times daily.            Multiple Vitamins-Minerals (CENTRUM SILVER PO) Take 1 tablet by mouth every morning.            Magnesium 500 MG TABS Take 1 tablet by mouth every evening.            benazepril (LOTENSIN) 20 MG tablet Take 20 mg by mouth every morning.           ezetimibe (ZETIA) 10 MG tablet Take 10 mg by mouth every evening.           donepezil (ARICEPT) 10 MG tablet Take 10 mg by mouth at bedtime.           aspirin 325 MG tablet Take 162.5 mg by mouth every evening.           folic acid (FOLVITE) 1 MG tablet Take 1 mg by mouth every morning.           simvastatin (ZOCOR) 20 MG tablet Take 20 mg by mouth every evening.           metoprolol (LOPRESSOR) 50 MG tablet Take 1 tablet (50 mg total) by mouth 2 (two) times daily.           levofloxacin (LEVAQUIN) 250 MG tablet Take 1 tablet (250 mg total) by mouth daily.           tiotropium (SPIRIVA HANDIHALER) 18 MCG inhalation capsule Place 1 capsule (18 mcg total) into inhaler and inhale daily.           predniSONE (STERAPRED UNI-PAK) 10 MG tablet Take 1 tablet (10 mg total) by mouth daily. Take 4 tabs with breakfast on Friday 10/4 Take 3 tabs with breakfast on Saturday 10/5 Take 2 tabs with breakfast on Sunday 10/6 Take 1 tab with breakfast on Monday 10/7.  Then stop.           albuterol (PROVENTIL HFA;VENTOLIN HFA) 108 (90 BASE) MCG/ACT inhaler Inhale 2 puffs into the lungs every 6 (six) hours  as needed for wheezing.             VVS, Skin clean, dry and intact without evidence of skin break down, no evidence of skin tears noted. IV catheter discontinued intact. Site without signs and symptoms of complications. Dressing and pressure applied.  An After Visit Summary was printed and given to the patient. Follow up appointments , new prescriptions and medication administration times given Patient escorted via WC, and D/C home via private auto.  Cindra Eves, RN 06/26/2012 3:15 PM

## 2012-06-26 NOTE — Discharge Summary (Addendum)
Addendum  Patient seen and examined, chart and data base reviewed.  I agree with the above assessment and discharge plan  For full details please see Mrs. Algis Downs PA. note.  COPD exacerbation, Auditory hallucinations likely secondary to steroids.  May needs psych eval if continues to have this hallucinations.  ARF likely from dehydration, his Cr at discharge is 1.35, follow up on BMP as out patient.  Clint Lipps Pager: [phone] 06/26/2012, 11:19 AM

## 2012-06-26 NOTE — Progress Notes (Signed)
[facility]  Patient Status: New  [facility] is providing the following services: SN, PT and OT  If patient discharges after hours, please call [phone].   [name] 06/26/2012, 11:16 AM

## 2012-07-07 ENCOUNTER — Ambulatory Visit (INDEPENDENT_AMBULATORY_CARE_PROVIDER_SITE_OTHER): Payer: No Typology Code available for payment source | Admitting: *Deleted

## 2012-07-07 DIAGNOSIS — I441 Atrioventricular block, second degree: Secondary | ICD-10-CM

## 2012-07-07 DIAGNOSIS — Z950 Presence of cardiac pacemaker: Secondary | ICD-10-CM

## 2012-07-08 ENCOUNTER — Encounter: Payer: Self-pay | Admitting: *Deleted

## 2012-07-08 DIAGNOSIS — Z950 Presence of cardiac pacemaker: Secondary | ICD-10-CM | POA: Insufficient documentation

## 2012-07-08 LAB — REMOTE PACEMAKER DEVICE
AL AMPLITUDE: 4.3 mv
AL IMPEDENCE PM: 390 Ohm
BAMS-0001: 180 {beats}/min
BATTERY VOLTAGE: 2.96 V
RV LEAD AMPLITUDE: 12 mv
RV LEAD IMPEDENCE PM: 580 Ohm
VENTRICULAR PACING PM: 1

## 2012-07-10 ENCOUNTER — Encounter: Payer: Self-pay | Admitting: *Deleted

## 2012-07-22 ENCOUNTER — Encounter: Payer: Self-pay | Admitting: Internal Medicine

## 2012-07-26 ENCOUNTER — Encounter (HOSPITAL_BASED_OUTPATIENT_CLINIC_OR_DEPARTMENT_OTHER): Payer: Self-pay | Admitting: *Deleted

## 2012-07-26 ENCOUNTER — Emergency Department (HOSPITAL_BASED_OUTPATIENT_CLINIC_OR_DEPARTMENT_OTHER)
Admission: EM | Admit: 2012-07-26 | Discharge: 2012-07-26 | Disposition: A | Discharge status: Home / Self Care | Payer: No Typology Code available for payment source | Attending: Emergency Medicine | Admitting: Emergency Medicine

## 2012-07-26 DIAGNOSIS — J3489 Other specified disorders of nose and nasal sinuses: Secondary | ICD-10-CM | POA: Insufficient documentation

## 2012-07-26 DIAGNOSIS — Z79899 Other long term (current) drug therapy: Secondary | ICD-10-CM | POA: Insufficient documentation

## 2012-07-26 DIAGNOSIS — H918X9 Other specified hearing loss, unspecified ear: Secondary | ICD-10-CM | POA: Insufficient documentation

## 2012-07-26 DIAGNOSIS — R059 Cough, unspecified: Secondary | ICD-10-CM | POA: Insufficient documentation

## 2012-07-26 DIAGNOSIS — G459 Transient cerebral ischemic attack, unspecified: Secondary | ICD-10-CM | POA: Insufficient documentation

## 2012-07-26 DIAGNOSIS — R05 Cough: Secondary | ICD-10-CM | POA: Insufficient documentation

## 2012-07-26 DIAGNOSIS — M25473 Effusion, unspecified ankle: Secondary | ICD-10-CM | POA: Insufficient documentation

## 2012-07-26 DIAGNOSIS — M25476 Effusion, unspecified foot: Secondary | ICD-10-CM | POA: Insufficient documentation

## 2012-07-26 DIAGNOSIS — Z87891 Personal history of nicotine dependence: Secondary | ICD-10-CM | POA: Insufficient documentation

## 2012-07-26 DIAGNOSIS — I252 Old myocardial infarction: Secondary | ICD-10-CM | POA: Insufficient documentation

## 2012-07-26 DIAGNOSIS — K219 Gastro-esophageal reflux disease without esophagitis: Secondary | ICD-10-CM | POA: Insufficient documentation

## 2012-07-26 DIAGNOSIS — J449 Chronic obstructive pulmonary disease, unspecified: Secondary | ICD-10-CM

## 2012-07-26 DIAGNOSIS — Z8719 Personal history of other diseases of the digestive system: Secondary | ICD-10-CM | POA: Insufficient documentation

## 2012-07-26 DIAGNOSIS — I2510 Atherosclerotic heart disease of native coronary artery without angina pectoris: Secondary | ICD-10-CM | POA: Insufficient documentation

## 2012-07-26 DIAGNOSIS — Z8619 Personal history of other infectious and parasitic diseases: Secondary | ICD-10-CM | POA: Insufficient documentation

## 2012-07-26 DIAGNOSIS — E785 Hyperlipidemia, unspecified: Secondary | ICD-10-CM | POA: Insufficient documentation

## 2012-07-26 DIAGNOSIS — K921 Melena: Secondary | ICD-10-CM | POA: Insufficient documentation

## 2012-07-26 DIAGNOSIS — J4489 Other specified chronic obstructive pulmonary disease: Secondary | ICD-10-CM | POA: Insufficient documentation

## 2012-07-26 DIAGNOSIS — I10 Essential (primary) hypertension: Secondary | ICD-10-CM | POA: Insufficient documentation

## 2012-07-26 DIAGNOSIS — Z7982 Long term (current) use of aspirin: Secondary | ICD-10-CM | POA: Insufficient documentation

## 2012-07-26 DIAGNOSIS — Z862 Personal history of diseases of the blood and blood-forming organs and certain disorders involving the immune mechanism: Secondary | ICD-10-CM | POA: Insufficient documentation

## 2012-07-26 DIAGNOSIS — N179 Acute kidney failure, unspecified: Secondary | ICD-10-CM | POA: Insufficient documentation

## 2012-07-26 DIAGNOSIS — E119 Type 2 diabetes mellitus without complications: Secondary | ICD-10-CM | POA: Insufficient documentation

## 2012-07-26 MED ORDER — PREDNISONE 50 MG PO TABS
60.0000 mg | ORAL_TABLET | Freq: Once | ORAL | Status: AC
  Administered 2012-07-26: 60 mg via ORAL
  Filled 2012-07-26: qty 1

## 2012-07-26 MED ORDER — PREDNISONE 10 MG PO TABS: 50.0000 mg | ORAL_TABLET | Freq: Every day | ORAL | Status: DC

## 2012-07-26 MED ORDER — IPRATROPIUM BROMIDE 0.02 % IN SOLN
RESPIRATORY_TRACT | Status: AC
  Administered 2012-07-26: 0.5 mg
  Filled 2012-07-26: qty 2.5

## 2012-07-26 MED ORDER — PREDNISONE 50 MG PO TABS: 50.0000 mg | ORAL_TABLET | Freq: Every day | ORAL | Status: DC

## 2012-07-26 MED ORDER — ALBUTEROL SULFATE (5 MG/ML) 0.5% IN NEBU
INHALATION_SOLUTION | RESPIRATORY_TRACT | Status: AC
  Administered 2012-07-26: 5 mg
  Filled 2012-07-26: qty 1

## 2012-07-26 MED ORDER — ALBUTEROL SULFATE (5 MG/ML) 0.5% IN NEBU
5.0000 mg | INHALATION_SOLUTION | Freq: Once | RESPIRATORY_TRACT | Status: AC
  Administered 2012-07-26: 5 mg via RESPIRATORY_TRACT
  Filled 2012-07-26: qty 1

## 2012-07-26 NOTE — ED Provider Notes (Signed)
History  This chart was scribed for [name], MD by Shari Heritage and Marlin Canary. The patient was seen in room MH05/MH05. Patient's care was started at 1955.   CSN: [id number]  Arrival date & time 07/26/12  4098   First MD Initiated Contact with Patient 07/26/12 1955      Chief Complaint  Patient presents with  . Shortness of Breath     The history is provided by the patient. No language interpreter was used.    [name] is a 78 y.o. male who presents to the Emergency Department complaining of continuous, moderate SOB onset 4 hours ago. Patient is also positive for rhinorrhea, cough with yellow sputum, ankle swelling, and occasional blood in stool. Patient denies CP, abdominal pain, nausea, vomiting or rash. Wife states that he was admitted to the hospital for wheezing and low oxygen saturation associated with bronchitis 4 weeks ago. He saw his PCP 3 weeks ago for follow up. Patietn uses [employer] at home, but is not on oxygen. Patient also reports that he thinks he vision has become progressively worse since he lost vision for about 3 seconds 3 weeks ago. He also states that he urinates every 30-45 mintues. He is a former smoker who denies smoking for the past 26 years. He smoked 2-3 packs per day for several decades starting at age 49. Patient has a cane and walker, but he does not use either. Patient has seasonal allergies.  PCP - Dr. Doristine Counter  Past Medical History  Diagnosis Date  . Chest discomfort   . Coronary artery disease   . Diabetes mellitus   . Hypertension   . Hyperlipidemia   . Obesities, morbid   . Gallstone pancreatitis     recurrent  . TIA (transient ischemic attack)   . Bacteremia due to vancomycin resistant Enterococcus   . GERD (gastroesophageal reflux disease)   . Hearing impairment   . Myocardial infarction, anterior wall, subsequent care   . Second degree AV block, Mobitz type II     s/p PPM by JA 4/12  . ARF (acute renal  failure)   . Cholangitis   . Duodenal ulcer   . GI bleed   . Anemia associated with acute blood loss     Past Surgical History  Procedure Date  . Cholecystectomy   . Pacemaker placement 01/01/11    implanted by JA for mobitz II AV block  . Pancreatic stent     History reviewed. No pertinent family history.  History  Substance Use Topics  . Smoking status: Former Smoker -- 1.0 packs/day for 57 years    Types: Cigarettes    Quit date: 01/12/1996  . Smokeless tobacco: Never Used  . Alcohol Use: No      Review of Systems At least 10pt or greater review of systems completed and are negative except where specified in the HPI.  Allergies  Doxycycline; Statins; and Penicillins  Home Medications   Current Outpatient Rx  Name Route Sig Dispense Refill  . ALBUTEROL SULFATE HFA 108 (90 BASE) MCG/ACT IN AERS Inhalation Inhale 2 puffs into the lungs every 6 (six) hours as needed for wheezing. 1 Inhaler 2  . ASPIRIN 325 MG PO TABS Oral Take 162.5 mg by mouth every evening.    Marland Kitchen BENAZEPRIL HCL 20 MG PO TABS Oral Take 20 mg by mouth every morning.    . DONEPEZIL HCL 10 MG PO TABS Oral Take 10 mg by mouth at bedtime.    Marland Kitchen  EZETIMIBE 10 MG PO TABS Oral Take 10 mg by mouth every evening.    Marland Kitchen FOLIC ACID 1 MG PO TABS Oral Take 1 mg by mouth every morning.    Marland Kitchen HYDROCHLOROTHIAZIDE 25 MG PO TABS Oral Take 12.5 mg by mouth every morning.     Marland Kitchen LEVOFLOXACIN 250 MG PO TABS Oral Take 1 tablet (250 mg total) by mouth daily. 3 tablet 0  . MAGNESIUM 500 MG PO TABS Oral Take 1 tablet by mouth every evening.     Marland Kitchen METOPROLOL TARTRATE 50 MG PO TABS Oral Take 1 tablet (50 mg total) by mouth 2 (two) times daily. 60 tablet 1  . CENTRUM SILVER PO Oral Take 1 tablet by mouth every morning.     Marland Kitchen NITROGLYCERIN 0.4 MG SL SUBL Sublingual Place 0.4 mg under the tongue every 5 (five) minutes as needed. For chest pain    . OMEPRAZOLE 20 MG PO CPDR Oral Take 20 mg by mouth 2 (two) times daily.     Marland Kitchen PREDNISONE  (PAK) 10 MG PO TABS Oral Take 1 tablet (10 mg total) by mouth daily. Take 4 tabs with breakfast on Friday 10/4 Take 3 tabs with breakfast on Saturday 10/5 Take 2 tabs with breakfast on Sunday 10/6 Take 1 tab with breakfast on Monday 10/7.  Then stop. 10 tablet 0  . SIMVASTATIN 20 MG PO TABS Oral Take 20 mg by mouth every evening.    Marland Kitchen TIOTROPIUM BROMIDE MONOHYDRATE 18 MCG IN CAPS Inhalation Place 1 capsule (18 mcg total) into inhaler and inhale daily. 30 capsule 12    BP 127/74  Pulse 79  Temp 97.8 F (36.6 C)  Resp 20  SpO2 99%  Physical Exam  Nursing notes reviewed.  Electronic medical record reviewed. VITAL SIGNS:   Filed Vitals:   07/26/12 1844 07/26/12 1903 07/26/12 1954 07/26/12 2107  BP: 127/74     Pulse: 79     Temp: 97.8 F (36.6 C)     Resp: 20     SpO2: 95% 96% 99% 95%   CONSTITUTIONAL: Awake, oriented, appears non-toxic HENT: Atraumatic, normocephalic, oral mucosa pink and moist, airway patent. Nares patent without drainage. External ears normal. EYES: Conjunctiva clear, EOMI, PERRLA NECK: Trachea midline, non-tender, supple CARDIOVASCULAR: Normal heart rate, Normal rhythm, No murmurs, rubs, gallops PULMONARY/CHEST: Clear to auscultation, no rhonchi, or rales.  Wheezing throughout, good air movement. Symmetrical breath sounds. Non-tender. ABDOMINAL: Non-distended, soft, non-tender - no rebound or guarding.  BS normal. NEUROLOGIC: Non-focal, moving all four extremities, no gross sensory or motor deficits. EXTREMITIES: No clubbing, cyanosis, or edema SKIN: Warm, Dry, No erythema, No rash  ED Course  Procedures (including critical care time)   Date: 07/28/2012  Rate: 71  Rhythm: normal sinus rhythm  QRS Axis: left  Intervals: normal  ST/T Wave abnormalities: Antero lateral Q waves  -old  Conduction Disutrbances: none  Narrative Interpretation: non-ischemic EKG, no significant changes   COORDINATION OF CARE: 8:08pm- Patient informed of current plan for  treatment and evaluation and agrees with plan at this time.    Labs Reviewed - No data to display No results found.   1. COPD (chronic obstructive pulmonary disease)       MDM  [name] is a 78 y.o. male presenting with cough and sob relieved with breathing treatment.  Pt asked to leave after first treatment - he was able to walk unassisted maintaining O2 sats >94. No changes on EKG.  Do not think this patient  has ACS - with h/o cough, change in sputum, likely COPD.  Pt was seen and treated for bronchitis with antibiotics recently - do not think he needs more antibiotics.  Do not think further testing required at this time.  Exacerbation of COPD - steroid burst and f/u with PCP on Monday.  I explained the diagnosis and have given explicit precautions to return to the ER including chest pain, worsening sputum production, fever or any other new or worsening symptoms. The patient understands and accepts the medical plan as it's been dictated and I have answered their questions. Discharge instructions concerning home care and prescriptions have been given.  The patient is STABLE and is discharged to home in good condition.    I personally performed the services described in this documentation, which was scribed in my presence. The recorded information has been reviewed and considered. [name], M.D.      [name], MD 07/28/12 1404

## 2012-07-26 NOTE — ED Notes (Signed)
MD at bedside. 

## 2012-07-26 NOTE — ED Notes (Signed)
EDP Bonk at bedside. 

## 2012-07-26 NOTE — ED Notes (Signed)
Pt presents to ED today with continued sx of [facility] following bronchitis and hospital admission

## 2012-07-26 NOTE — ED Notes (Signed)
RT at bedside to admin breathing tx

## 2012-08-14 ENCOUNTER — Emergency Department (HOSPITAL_COMMUNITY): Payer: No Typology Code available for payment source

## 2012-08-14 ENCOUNTER — Encounter (HOSPITAL_COMMUNITY): Payer: Self-pay | Admitting: Emergency Medicine

## 2012-08-14 ENCOUNTER — Emergency Department (HOSPITAL_COMMUNITY)
Admission: EM | Admit: 2012-08-14 | Discharge: 2012-08-14 | Disposition: A | Discharge status: Home / Self Care | Payer: No Typology Code available for payment source | Attending: Emergency Medicine | Admitting: Emergency Medicine

## 2012-08-14 DIAGNOSIS — K219 Gastro-esophageal reflux disease without esophagitis: Secondary | ICD-10-CM | POA: Insufficient documentation

## 2012-08-14 DIAGNOSIS — Z8673 Personal history of transient ischemic attack (TIA), and cerebral infarction without residual deficits: Secondary | ICD-10-CM | POA: Insufficient documentation

## 2012-08-14 DIAGNOSIS — I2510 Atherosclerotic heart disease of native coronary artery without angina pectoris: Secondary | ICD-10-CM | POA: Insufficient documentation

## 2012-08-14 DIAGNOSIS — R0609 Other forms of dyspnea: Secondary | ICD-10-CM | POA: Insufficient documentation

## 2012-08-14 DIAGNOSIS — I10 Essential (primary) hypertension: Secondary | ICD-10-CM | POA: Insufficient documentation

## 2012-08-14 DIAGNOSIS — Z8679 Personal history of other diseases of the circulatory system: Secondary | ICD-10-CM | POA: Insufficient documentation

## 2012-08-14 DIAGNOSIS — R1110 Vomiting, unspecified: Secondary | ICD-10-CM

## 2012-08-14 DIAGNOSIS — Z87891 Personal history of nicotine dependence: Secondary | ICD-10-CM | POA: Insufficient documentation

## 2012-08-14 DIAGNOSIS — Z8719 Personal history of other diseases of the digestive system: Secondary | ICD-10-CM | POA: Insufficient documentation

## 2012-08-14 DIAGNOSIS — Z7982 Long term (current) use of aspirin: Secondary | ICD-10-CM | POA: Insufficient documentation

## 2012-08-14 DIAGNOSIS — E119 Type 2 diabetes mellitus without complications: Secondary | ICD-10-CM | POA: Insufficient documentation

## 2012-08-14 DIAGNOSIS — I252 Old myocardial infarction: Secondary | ICD-10-CM | POA: Insufficient documentation

## 2012-08-14 DIAGNOSIS — I441 Atrioventricular block, second degree: Secondary | ICD-10-CM | POA: Insufficient documentation

## 2012-08-14 DIAGNOSIS — R0682 Tachypnea, not elsewhere classified: Secondary | ICD-10-CM | POA: Insufficient documentation

## 2012-08-14 DIAGNOSIS — Z79899 Other long term (current) drug therapy: Secondary | ICD-10-CM | POA: Insufficient documentation

## 2012-08-14 DIAGNOSIS — N179 Acute kidney failure, unspecified: Secondary | ICD-10-CM | POA: Insufficient documentation

## 2012-08-14 DIAGNOSIS — D649 Anemia, unspecified: Secondary | ICD-10-CM | POA: Insufficient documentation

## 2012-08-14 DIAGNOSIS — E785 Hyperlipidemia, unspecified: Secondary | ICD-10-CM | POA: Insufficient documentation

## 2012-08-14 DIAGNOSIS — R0989 Other specified symptoms and signs involving the circulatory and respiratory systems: Secondary | ICD-10-CM | POA: Insufficient documentation

## 2012-08-14 DIAGNOSIS — Z8619 Personal history of other infectious and parasitic diseases: Secondary | ICD-10-CM | POA: Insufficient documentation

## 2012-08-14 DIAGNOSIS — R0600 Dyspnea, unspecified: Secondary | ICD-10-CM

## 2012-08-14 LAB — CBC
HCT: 37.3 % — ABNORMAL LOW (ref 39.0–52.0)
Platelets: 204 10*3/uL (ref 150–400)
RDW: 13.4 % (ref 11.5–15.5)
WBC: 8.6 10*3/uL (ref 4.0–10.5)

## 2012-08-14 LAB — COMPREHENSIVE METABOLIC PANEL
ALT: 12 U/L (ref 0–53)
AST: 21 U/L (ref 0–37)
Albumin: 3.3 g/dL — ABNORMAL LOW (ref 3.5–5.2)
Alkaline Phosphatase: 95 U/L (ref 39–117)
Chloride: 99 mEq/L (ref 96–112)
Potassium: 3.4 mEq/L — ABNORMAL LOW (ref 3.5–5.1)
Sodium: 137 mEq/L (ref 135–145)
Total Bilirubin: 0.3 mg/dL (ref 0.3–1.2)

## 2012-08-14 LAB — POCT I-STAT 3, ART BLOOD GAS (G3+)
Bicarbonate: 25.6 mEq/L — ABNORMAL HIGH (ref 20.0–24.0)
O2 Saturation: 97 %
pCO2 arterial: 42.7 mmHg (ref 35.0–45.0)
pO2, Arterial: 94 mmHg (ref 80.0–100.0)

## 2012-08-14 MED ORDER — ONDANSETRON HCL 4 MG/2ML IJ SOLN
4.0000 mg | Freq: Once | INTRAMUSCULAR | Status: AC
  Administered 2012-08-14: 4 mg via INTRAVENOUS
  Filled 2012-08-14: qty 2

## 2012-08-14 NOTE — ED Notes (Signed)
Ambulated Patient with 02 Sats,. Started with at 97% dropped down to 90%

## 2012-08-14 NOTE — ED Provider Notes (Signed)
History     CSN: [id number]  Arrival date & time 08/14/12  0945   First MD Initiated Contact with Patient 08/14/12 1023      Chief Complaint  Patient presents with  . Shortness of Breath    (Consider location/radiation/quality/duration/timing/severity/associated sxs/prior treatment) HPI Comments: Mr. Skare presents via EMS for evaluation of shortness of breath.  He received albuterol and atrovent en route to the ER for treatment of wheezing.  He states he has experienced progressively worsening shortness of breath and exertional dyspnea over the last 2-3 days.  He denies fever , coughing, chest pain, orthopnea, and leg swelling.  He states this sis similar to the symptoms he was having 2 weeks ago that prompted an ER visit.    Patient is a 78 y.o. male presenting with shortness of breath. The history is provided by the patient. No language interpreter was used.  Shortness of Breath  The current episode started 2 days ago. The onset was gradual. The problem occurs continuously. The problem has been gradually worsening. The problem is moderate. The symptoms are relieved by rest. The symptoms are aggravated by activity. Associated symptoms include shortness of breath and wheezing. Pertinent negatives include no chest pain, no chest pressure, no orthopnea, no fever, no rhinorrhea, no sore throat, no stridor and no cough. He has not inhaled smoke recently. He has had intermittent steroid use. He has had prior hospitalizations. He has had no prior intubations. His past medical history is significant for past wheezing. He has been behaving normally. Urine output has been normal. There were no sick contacts. Recently, medical care has been given at this facility.    Past Medical History  Diagnosis Date  . Chest discomfort   . Coronary artery disease   . Diabetes mellitus   . Hypertension   . Hyperlipidemia   . Obesities, morbid   . Gallstone pancreatitis     recurrent  . TIA (transient  ischemic attack)   . Bacteremia due to vancomycin resistant Enterococcus   . GERD (gastroesophageal reflux disease)   . Hearing impairment   . Myocardial infarction, anterior wall, subsequent care   . Second degree AV block, Mobitz type II     s/p PPM by JA 4/12  . ARF (acute renal failure)   . Cholangitis   . Duodenal ulcer   . GI bleed   . Anemia associated with acute blood loss     Past Surgical History  Procedure Date  . Cholecystectomy   . Pacemaker placement 01/01/11    implanted by JA for mobitz II AV block  . Pancreatic stent     History reviewed. No pertinent family history.  History  Substance Use Topics  . Smoking status: Former Smoker -- 1.0 packs/day for 57 years    Types: Cigarettes    Quit date: 01/12/1996  . Smokeless tobacco: Never Used  . Alcohol Use: No      Review of Systems  Constitutional: Negative for fever.  HENT: Negative for sore throat and rhinorrhea.   Respiratory: Positive for shortness of breath and wheezing. Negative for cough and stridor.   Cardiovascular: Negative for chest pain and orthopnea.  All other systems reviewed and are negative.    Allergies  Doxycycline; Statins; and Penicillins  Home Medications   Current Outpatient Rx  Name  Route  Sig  Dispense  Refill  . ALBUTEROL SULFATE HFA 108 (90 BASE) MCG/ACT IN AERS   Inhalation   Inhale 2 puffs into the lungs  every 6 (six) hours as needed for wheezing.   1 Inhaler   2   . ASPIRIN 325 MG PO TABS   Oral   Take 162.5 mg by mouth every evening.         Marland Kitchen BENAZEPRIL HCL 20 MG PO TABS   Oral   Take 20 mg by mouth every morning.         . DONEPEZIL HCL 10 MG PO TABS   Oral   Take 10 mg by mouth at bedtime.         Marland Kitchen EZETIMIBE 10 MG PO TABS   Oral   Take 10 mg by mouth every evening.         Marland Kitchen FOLIC ACID 1 MG PO TABS   Oral   Take 1 mg by mouth every morning.         Marland Kitchen HYDROCHLOROTHIAZIDE 25 MG PO TABS   Oral   Take 12.5 mg by mouth every morning.           Marland Kitchen LEVOFLOXACIN 250 MG PO TABS   Oral   Take 1 tablet (250 mg total) by mouth daily.   3 tablet   0   . MAGNESIUM 500 MG PO TABS   Oral   Take 1 tablet by mouth every evening.          Marland Kitchen METOPROLOL TARTRATE 50 MG PO TABS   Oral   Take 1 tablet (50 mg total) by mouth 2 (two) times daily.   60 tablet   1   . CENTRUM SILVER PO   Oral   Take 1 tablet by mouth every morning.          Marland Kitchen NITROGLYCERIN 0.4 MG SL SUBL   Sublingual   Place 0.4 mg under the tongue every 5 (five) minutes as needed. For chest pain         . OMEPRAZOLE 20 MG PO CPDR   Oral   Take 20 mg by mouth 2 (two) times daily.          Marland Kitchen PREDNISONE 50 MG PO TABS   Oral   Take 1 tablet (50 mg total) by mouth daily.   4 tablet   0   . PREDNISONE (PAK) 10 MG PO TABS   Oral   Take 1 tablet (10 mg total) by mouth daily. Take 4 tabs with breakfast on Friday 10/4 Take 3 tabs with breakfast on Saturday 10/5 Take 2 tabs with breakfast on Sunday 10/6 Take 1 tab with breakfast on Monday 10/7.  Then stop.   10 tablet   0   . SIMVASTATIN 20 MG PO TABS   Oral   Take 20 mg by mouth every evening.         Marland Kitchen TIOTROPIUM BROMIDE MONOHYDRATE 18 MCG IN CAPS   Inhalation   Place 1 capsule (18 mcg total) into inhaler and inhale daily.   30 capsule   12     BP 148/74  Pulse 78  Temp 97 F (36.1 C)  Resp 26  SpO2 97%  Physical Exam  Nursing note and vitals reviewed. Constitutional: He is oriented to person, place, and time. He appears well-developed and well-nourished. No distress. He is not intubated.  HENT:  Head: Normocephalic and atraumatic.  Right Ear: External ear normal.  Left Ear: External ear normal.  Nose: Nose normal.  Mouth/Throat: Oropharynx is clear and moist. No oropharyngeal exudate.       + hearing impairment  Eyes:  Conjunctivae normal are normal. Pupils are equal, round, and reactive to light. Right eye exhibits no discharge. Left eye exhibits no discharge. No scleral icterus.   Neck: Normal range of motion. Neck supple. JVD present. No tracheal deviation present.  Cardiovascular: Regular rhythm, S1 normal, S2 normal, intact distal pulses and normal pulses.   No extrasystoles are present. PMI is not displaced.  Exam reveals distant heart sounds. Exam reveals no gallop, no friction rub and no decreased pulses.   No murmur heard. Pulmonary/Chest: No accessory muscle usage. Tachypnea noted. No apnea and not bradypneic. He is not intubated. No respiratory distress. He has decreased breath sounds. He has no wheezes. He has no rhonchi. He has no rales.       Greatly diminished breath sounds.  Poor air movement.  Abdominal: Soft. Normal appearance and bowel sounds are normal. There is no tenderness. There is no rigidity, no rebound, no guarding, no CVA tenderness, no tenderness at McBurney's point and negative Murphy's sign. No hernia.  Musculoskeletal: Normal range of motion. He exhibits no edema and no tenderness.  Lymphadenopathy:    He has no cervical adenopathy.  Neurological: He is alert and oriented to person, place, and time. No cranial nerve deficit.  Skin: Skin is warm and dry. No rash noted. He is not diaphoretic. No erythema. No pallor.    ED Course  Procedures (including critical care time)   Labs Reviewed  CBC  COMPREHENSIVE METABOLIC PANEL  TROPONIN I  LIPASE, BLOOD  PRO B NATRIURETIC PEPTIDE  BLOOD GAS, ARTERIAL   No results found.   No diagnosis found.   Date: 08/14/2012  Rate: 90 bpm  Rhythm: sinus  QRS Axis: left  Intervals: normal  ST/T Wave abnormalities: normal  Conduction Disutrbances:left anterior fascicular block  Narrative Interpretation:   Old EKG Reviewed: unchanged      MDM  Pt presents for evaluation of shortness of breath.  He appears uncomfortable with a slightly elevated respiratory rate.  Note diminished breath sounds bilat but no distress.  Pt reports feeling nauseated and is actively vomiting.  He denies having any  pain.  Will obtain and EKG, basic labs, CXR, ABG.  Pt has a hx of CAD, ARF, and bronchitis.  Will provide symptomatic care while awaiting results.  1720.  Pt stable, NAD.  Troponin obtained >3 hrs s/p arrival is negative.  CXR is negative for infiltrate, edema, or effusion.  EKG is negative for evidence of acute ischemia.  ABG is negative for elevated pCO2 or depressed pO2.  Pt appears comfortable, NAD and his O2 sat remains stable while ambulating.  He has had no further vomiting and has tolerated po clears.  Serum lipase is also normal.  Plan discharge home to follow-up closely with his PMD.  Discussed indications for immediate return to the emergency department.     Tobin Chad, MD 08/14/12 1725

## 2012-08-14 NOTE — ED Notes (Signed)
NAD noted at time of d/c home 

## 2012-08-14 NOTE — ED Notes (Signed)
Pt brought in via EMS for SOB. Audible wheezing per EMS. Given 10mg  Albuterol and 5mg  Atrovent in route. Pt reports SOB has been over last few days.

## 2012-09-07 ENCOUNTER — Emergency Department (HOSPITAL_COMMUNITY): Payer: No Typology Code available for payment source

## 2012-09-07 ENCOUNTER — Encounter (HOSPITAL_COMMUNITY): Payer: Self-pay | Admitting: *Deleted

## 2012-09-07 ENCOUNTER — Emergency Department (HOSPITAL_COMMUNITY)
Admission: EM | Admit: 2012-09-07 | Discharge: 2012-09-07 | Disposition: A | Discharge status: Home / Self Care | Payer: No Typology Code available for payment source | Attending: Emergency Medicine | Admitting: Emergency Medicine

## 2012-09-07 DIAGNOSIS — I10 Essential (primary) hypertension: Secondary | ICD-10-CM | POA: Insufficient documentation

## 2012-09-07 DIAGNOSIS — F0390 Unspecified dementia without behavioral disturbance: Secondary | ICD-10-CM | POA: Insufficient documentation

## 2012-09-07 DIAGNOSIS — Z87891 Personal history of nicotine dependence: Secondary | ICD-10-CM | POA: Insufficient documentation

## 2012-09-07 DIAGNOSIS — Z8669 Personal history of other diseases of the nervous system and sense organs: Secondary | ICD-10-CM | POA: Insufficient documentation

## 2012-09-07 DIAGNOSIS — Z7982 Long term (current) use of aspirin: Secondary | ICD-10-CM | POA: Insufficient documentation

## 2012-09-07 DIAGNOSIS — Z862 Personal history of diseases of the blood and blood-forming organs and certain disorders involving the immune mechanism: Secondary | ICD-10-CM | POA: Insufficient documentation

## 2012-09-07 DIAGNOSIS — I252 Old myocardial infarction: Secondary | ICD-10-CM | POA: Insufficient documentation

## 2012-09-07 DIAGNOSIS — R062 Wheezing: Secondary | ICD-10-CM | POA: Insufficient documentation

## 2012-09-07 DIAGNOSIS — I2510 Atherosclerotic heart disease of native coronary artery without angina pectoris: Secondary | ICD-10-CM | POA: Insufficient documentation

## 2012-09-07 DIAGNOSIS — K219 Gastro-esophageal reflux disease without esophagitis: Secondary | ICD-10-CM | POA: Insufficient documentation

## 2012-09-07 DIAGNOSIS — R0902 Hypoxemia: Secondary | ICD-10-CM | POA: Insufficient documentation

## 2012-09-07 DIAGNOSIS — Z8619 Personal history of other infectious and parasitic diseases: Secondary | ICD-10-CM | POA: Insufficient documentation

## 2012-09-07 DIAGNOSIS — J449 Chronic obstructive pulmonary disease, unspecified: Secondary | ICD-10-CM | POA: Insufficient documentation

## 2012-09-07 DIAGNOSIS — E119 Type 2 diabetes mellitus without complications: Secondary | ICD-10-CM | POA: Insufficient documentation

## 2012-09-07 DIAGNOSIS — E785 Hyperlipidemia, unspecified: Secondary | ICD-10-CM | POA: Insufficient documentation

## 2012-09-07 DIAGNOSIS — R05 Cough: Secondary | ICD-10-CM | POA: Insufficient documentation

## 2012-09-07 DIAGNOSIS — Z8719 Personal history of other diseases of the digestive system: Secondary | ICD-10-CM | POA: Insufficient documentation

## 2012-09-07 DIAGNOSIS — J4489 Other specified chronic obstructive pulmonary disease: Secondary | ICD-10-CM | POA: Insufficient documentation

## 2012-09-07 DIAGNOSIS — R059 Cough, unspecified: Secondary | ICD-10-CM | POA: Insufficient documentation

## 2012-09-07 DIAGNOSIS — Z8679 Personal history of other diseases of the circulatory system: Secondary | ICD-10-CM | POA: Insufficient documentation

## 2012-09-07 DIAGNOSIS — R0602 Shortness of breath: Secondary | ICD-10-CM

## 2012-09-07 DIAGNOSIS — Z79899 Other long term (current) drug therapy: Secondary | ICD-10-CM | POA: Insufficient documentation

## 2012-09-07 DIAGNOSIS — Z8673 Personal history of transient ischemic attack (TIA), and cerebral infarction without residual deficits: Secondary | ICD-10-CM | POA: Insufficient documentation

## 2012-09-07 LAB — BASIC METABOLIC PANEL
CO2: 29 mEq/L (ref 19–32)
Calcium: 9.2 mg/dL (ref 8.4–10.5)
Creatinine, Ser: 1.34 mg/dL (ref 0.50–1.35)
Glucose, Bld: 181 mg/dL — ABNORMAL HIGH (ref 70–99)
Sodium: 136 mEq/L (ref 135–145)

## 2012-09-07 LAB — CBC WITH DIFFERENTIAL/PLATELET
Basophils Relative: 0 % (ref 0–1)
Eosinophils Absolute: 0 10*3/uL (ref 0.0–0.7)
HCT: 39 % (ref 39.0–52.0)
Hemoglobin: 12.8 g/dL — ABNORMAL LOW (ref 13.0–17.0)
Lymphs Abs: 1.5 10*3/uL (ref 0.7–4.0)
MCH: 30.7 pg (ref 26.0–34.0)
MCHC: 32.8 g/dL (ref 30.0–36.0)
Monocytes Absolute: 0.2 10*3/uL (ref 0.1–1.0)
Monocytes Relative: 2 % — ABNORMAL LOW (ref 3–12)
Neutro Abs: 8.2 10*3/uL — ABNORMAL HIGH (ref 1.7–7.7)
RBC: 4.17 MIL/uL — ABNORMAL LOW (ref 4.22–5.81)

## 2012-09-07 LAB — POCT I-STAT TROPONIN I

## 2012-09-07 MED ORDER — ALBUTEROL SULFATE (5 MG/ML) 0.5% IN NEBU
5.0000 mg | INHALATION_SOLUTION | Freq: Once | RESPIRATORY_TRACT | Status: AC
  Administered 2012-09-07: 5 mg via RESPIRATORY_TRACT
  Filled 2012-09-07: qty 40

## 2012-09-07 MED ORDER — PREDNISONE 20 MG PO TABS: 40.0000 mg | ORAL_TABLET | Freq: Every day | ORAL | Status: DC

## 2012-09-07 MED ORDER — IPRATROPIUM BROMIDE 0.02 % IN SOLN
0.5000 mg | Freq: Once | RESPIRATORY_TRACT | Status: AC
  Administered 2012-09-07: 0.5 mg via RESPIRATORY_TRACT
  Filled 2012-09-07: qty 2.5

## 2012-09-07 NOTE — ED Notes (Signed)
Pt has history of copd and was in church and had acute onset of SOB with wheezing.  Pt has had albuterol 10mg  and Atrovent 0.5, and solumedrol 125mg  IV.  Pt is on 02/4L and sats 97 %.  Pt denies pain and states that symptoms are better

## 2012-09-07 NOTE — ED Notes (Signed)
Ambulated the patient around the department, his o2 sats droped to 91 off oxygen. No lightheadedness or dizziness, returned patient back to his room and placed him back on oxygen and the monitor.

## 2012-09-07 NOTE — ED Provider Notes (Signed)
History     CSN: [id number]  Arrival date & time 09/07/12  1116   First MD Initiated Contact with Patient 09/07/12 1120      Chief Complaint  Patient presents with  . Shortness of Breath    (Consider location/radiation/quality/duration/timing/severity/associated sxs/prior treatment) HPI Comments: Patient with history of CAD, COPD -- presents today with acute onset of shortness of breath while he was at church. EMS noted wheezing as well. Most of history obtained from wife who is at bedside. Patient is hard of hearing and has dementia. Patient was in his normal state of health this morning. EMS administered albuterol 10 mg,Atrovent 0.5 mg and Solumedrol 125 mg. Patient denies chest pain. No recent fevers. Cough is at baseline. No extremity edema. Patient is feeling better after treatment. Onset acute. Course is gradually improving. Nothing makes symptoms worse. Patient has 2 recent emergency department visits for the same symptoms. He is not on home oxygen.  Patient is a 78 y.o. male presenting with shortness of breath. The history is provided by the patient and the spouse.  Shortness of Breath  Associated symptoms include cough, shortness of breath and wheezing. Pertinent negatives include no chest pain, no fever, no rhinorrhea and no sore throat.    Past Medical History  Diagnosis Date  . Chest discomfort   . Coronary artery disease   . Diabetes mellitus   . Hypertension   . Hyperlipidemia   . Obesities, morbid   . Gallstone pancreatitis     recurrent  . TIA (transient ischemic attack)   . Bacteremia due to vancomycin resistant Enterococcus   . GERD (gastroesophageal reflux disease)   . Hearing impairment   . Myocardial infarction, anterior wall, subsequent care   . Second degree AV block, Mobitz type II     s/p PPM by JA 4/12  . ARF (acute renal failure)   . Cholangitis   . Duodenal ulcer   . GI bleed   . Anemia associated with acute blood loss     Past Surgical  History  Procedure Date  . Cholecystectomy   . Pacemaker placement 01/01/11    implanted by JA for mobitz II AV block  . Pancreatic stent     No family history on file.  History  Substance Use Topics  . Smoking status: Former Smoker -- 1.0 packs/day for 57 years    Types: Cigarettes    Quit date: 01/12/1996  . Smokeless tobacco: Never Used  . Alcohol Use: No      Review of Systems  Constitutional: Negative for fever.  HENT: Negative for sore throat and rhinorrhea.   Eyes: Negative for redness.  Respiratory: Positive for cough, shortness of breath and wheezing.   Cardiovascular: Negative for chest pain and leg swelling.  Gastrointestinal: Negative for nausea, vomiting, abdominal pain and diarrhea.  Genitourinary: Negative for dysuria.  Musculoskeletal: Negative for myalgias.  Skin: Negative for rash.  Neurological: Negative for headaches.    Allergies  Doxycycline; Statins; and Penicillins  Home Medications   Current Outpatient Rx  Name  Route  Sig  Dispense  Refill  . ALBUTEROL SULFATE HFA 108 (90 BASE) MCG/ACT IN AERS   Inhalation   Inhale 2 puffs into the lungs every 6 (six) hours as needed for wheezing.   1 Inhaler   2   . ASPIRIN 325 MG PO TABS   Oral   Take 162.5 mg by mouth every evening.         Marland Kitchen BENAZEPRIL HCL 20  MG PO TABS   Oral   Take 20 mg by mouth every morning.         . DONEPEZIL HCL 10 MG PO TABS   Oral   Take 10 mg by mouth at bedtime.         Marland Kitchen EZETIMIBE 10 MG PO TABS   Oral   Take 10 mg by mouth every evening.         Marland Kitchen FOLIC ACID 1 MG PO TABS   Oral   Take 1 mg by mouth every morning.         Marland Kitchen HYDROCHLOROTHIAZIDE 25 MG PO TABS   Oral   Take 12.5 mg by mouth every morning.          Marland Kitchen MAGNESIUM 500 MG PO TABS   Oral   Take 1 tablet by mouth every evening.          Marland Kitchen METOPROLOL TARTRATE 50 MG PO TABS   Oral   Take 1 tablet (50 mg total) by mouth 2 (two) times daily.   60 tablet   1   . CENTRUM SILVER PO    Oral   Take 1 tablet by mouth every morning.          Marland Kitchen NITROGLYCERIN 0.4 MG SL SUBL   Sublingual   Place 0.4 mg under the tongue every 5 (five) minutes as needed. For chest pain         . OMEPRAZOLE 20 MG PO CPDR   Oral   Take 20 mg by mouth 2 (two) times daily.          Marland Kitchen SIMVASTATIN 20 MG PO TABS   Oral   Take 20 mg by mouth every evening.         Marland Kitchen TIOTROPIUM BROMIDE MONOHYDRATE 18 MCG IN CAPS   Inhalation   Place 1 capsule (18 mcg total) into inhaler and inhale daily.   30 capsule   12     BP 115/54  Pulse 97  Temp 97.7 F (36.5 C) (Oral)  Resp 15  Ht 5\' 6"  (1.676 m)  Wt 198 lb (89.812 kg)  BMI 31.96 kg/m2  SpO2 97%  Physical Exam  Nursing note and vitals reviewed. Constitutional: He appears well-developed and well-nourished.       O2 sat 92-96% when not on oxygen.   HENT:  Head: Normocephalic and atraumatic.  Mouth/Throat: Oropharynx is clear and moist.  Eyes: Conjunctivae normal are normal. Right eye exhibits no discharge. Left eye exhibits no discharge.  Neck: Normal range of motion. Neck supple. No JVD present.  Cardiovascular: Normal rate, regular rhythm and normal heart sounds.   Pulmonary/Chest: Effort normal and breath sounds normal. No respiratory distress. He has no wheezes. He has no rales.  Abdominal: Soft. There is no tenderness. There is no rebound and no guarding.  Neurological: He is alert.  Skin: Skin is warm and dry.  Psychiatric: He has a normal mood and affect.    ED Course  Procedures (including critical care time)  Labs Reviewed  CBC WITH DIFFERENTIAL - Abnormal; Notable for the following:    RBC 4.17 (*)     Hemoglobin 12.8 (*)     Neutrophils Relative 83 (*)     Neutro Abs 8.2 (*)     Monocytes Relative 2 (*)     All other components within normal limits  BASIC METABOLIC PANEL - Abnormal; Notable for the following:    Glucose, Bld 181 (*)  GFR calc non Af Amer 47 (*)     GFR calc Af Amer 55 (*)     All other  components within normal limits  POCT I-STAT TROPONIN I   Dg Chest 2 View  09/07/2012  *RADIOLOGY REPORT*  Clinical Data: Shortness of breath.  Previous myocardial infarct.  CHEST - 2 VIEW  Comparison: 08/14/2012  Findings: Mild scarring in the right lung base is stable.  No evidence of pulmonary infiltrate or edema.  No evidence of pleural effusion.  Mild cardiomegaly stable.  Dual lead transvenous pacemaker remains in appropriate position.  IMPRESSION: Mild right basilar scarring.  No active disease.   Original Report Authenticated By: Myles Rosenthal, M.D.      1. SOB (shortness of breath)   2. COPD (chronic obstructive pulmonary disease)     11:59 AM Patient seen and examined. Work-up initiated. Medications ordered.   Vital signs reviewed and are as follows: Filed Vitals:   09/07/12 1145  BP: 115/54  Pulse: 97  Temp:   Resp: 15  BP 115/54  Pulse 97  Temp 97.7 F (36.5 C) (Oral)  Resp 15  Ht 5\' 6"  (1.676 m)  Wt 198 lb (89.812 kg)  BMI 31.96 kg/m2  SpO2 97%   Date: 09/07/2012  Rate: 97  Rhythm: normal sinus rhythm  QRS Axis: left  Intervals: normal  ST/T Wave abnormalities: normal  Conduction Disutrbances:left anterior fascicular block  Narrative Interpretation:   Old EKG Reviewed: unchanged from 08/14/12  3:21 PM Patient continues to feel well. Patient was ambulated without difficulty. O2 sat decreased to 92%. This is probably his usual baseline.   Patient does not want to stay in the hospital. Patient and wife state that given current condition, they are comfortable with discharge home. They will continue usual home medications. They agree to take steroids and follow-up with PCP this week. The patient may need evaluation for home O2 if these episodes continue.   Patient urged to return with worsening symptoms, worsening trouble breathing, increased work of breathing, worsening shortness of breath, chest pain, fever, or if they have any other concerns. Patient and wife  verbalized understanding and agree with the plan.    MDM  Patient with history of COPD with episode of shortness of breath. Patient was much improved after treatments with albuterol, steroids, Atrovent. He has ambulated well without significant shortness of breath. O2 sat dropped to 92% however patient is likely mildly hypoxic at baseline. He appears well in emergency department. He has primary care followup. His wife is able to help care for him at home. Patient agrees to follow up with primary care physician in the next few days. Will discharge home on course of steroids. Patient given appropriate instructions.       Renne Crigler, [place] 09/07/12 [phone]

## 2012-09-07 NOTE — ED Provider Notes (Signed)
Medical screening examination/treatment/procedure(s) were conducted as a shared visit with non-physician practitioner(s) and myself.  I personally evaluated the patient during the encounter  Ethelda Chick, MD 09/07/12 [phone]

## 2012-10-13 ENCOUNTER — Encounter: Payer: Self-pay | Admitting: Internal Medicine

## 2012-10-13 ENCOUNTER — Ambulatory Visit (INDEPENDENT_AMBULATORY_CARE_PROVIDER_SITE_OTHER): Payer: Medicare Other | Admitting: *Deleted

## 2012-10-13 DIAGNOSIS — I441 Atrioventricular block, second degree: Secondary | ICD-10-CM

## 2012-10-13 DIAGNOSIS — Z950 Presence of cardiac pacemaker: Secondary | ICD-10-CM

## 2012-10-13 LAB — REMOTE PACEMAKER DEVICE
AL AMPLITUDE: 4.2 mv
AL THRESHOLD: 0.625 V
BAMS-0001: 180 {beats}/min
RV LEAD THRESHOLD: 2.375 V
VENTRICULAR PACING PM: 1

## 2012-10-27 ENCOUNTER — Emergency Department (HOSPITAL_BASED_OUTPATIENT_CLINIC_OR_DEPARTMENT_OTHER): Payer: Medicare Other

## 2012-10-27 ENCOUNTER — Inpatient Hospital Stay (HOSPITAL_BASED_OUTPATIENT_CLINIC_OR_DEPARTMENT_OTHER)
Admission: EM | Admit: 2012-10-27 | Discharge: 2012-11-04 | DRG: 191 | Disposition: A | Discharge status: Home / Self Care | Payer: Medicare Other | Attending: Critical Care Medicine | Admitting: Critical Care Medicine

## 2012-10-27 ENCOUNTER — Encounter (HOSPITAL_BASED_OUTPATIENT_CLINIC_OR_DEPARTMENT_OTHER): Payer: Self-pay | Admitting: *Deleted

## 2012-10-27 DIAGNOSIS — T380X5A Adverse effect of glucocorticoids and synthetic analogues, initial encounter: Secondary | ICD-10-CM | POA: Diagnosis not present

## 2012-10-27 DIAGNOSIS — I2510 Atherosclerotic heart disease of native coronary artery without angina pectoris: Secondary | ICD-10-CM

## 2012-10-27 DIAGNOSIS — I129 Hypertensive chronic kidney disease with stage 1 through stage 4 chronic kidney disease, or unspecified chronic kidney disease: Secondary | ICD-10-CM | POA: Diagnosis present

## 2012-10-27 DIAGNOSIS — T486X5A Adverse effect of antiasthmatics, initial encounter: Secondary | ICD-10-CM | POA: Diagnosis present

## 2012-10-27 DIAGNOSIS — I2589 Other forms of chronic ischemic heart disease: Secondary | ICD-10-CM | POA: Diagnosis present

## 2012-10-27 DIAGNOSIS — G459 Transient cerebral ischemic attack, unspecified: Secondary | ICD-10-CM

## 2012-10-27 DIAGNOSIS — K5909 Other constipation: Secondary | ICD-10-CM | POA: Diagnosis present

## 2012-10-27 DIAGNOSIS — Z87891 Personal history of nicotine dependence: Secondary | ICD-10-CM

## 2012-10-27 DIAGNOSIS — N189 Chronic kidney disease, unspecified: Secondary | ICD-10-CM | POA: Diagnosis present

## 2012-10-27 DIAGNOSIS — R0789 Other chest pain: Secondary | ICD-10-CM

## 2012-10-27 DIAGNOSIS — D72829 Elevated white blood cell count, unspecified: Secondary | ICD-10-CM

## 2012-10-27 DIAGNOSIS — I10 Essential (primary) hypertension: Secondary | ICD-10-CM

## 2012-10-27 DIAGNOSIS — F0390 Unspecified dementia without behavioral disturbance: Secondary | ICD-10-CM | POA: Diagnosis present

## 2012-10-27 DIAGNOSIS — I5022 Chronic systolic (congestive) heart failure: Secondary | ICD-10-CM

## 2012-10-27 DIAGNOSIS — E119 Type 2 diabetes mellitus without complications: Secondary | ICD-10-CM

## 2012-10-27 DIAGNOSIS — Z8673 Personal history of transient ischemic attack (TIA), and cerebral infarction without residual deficits: Secondary | ICD-10-CM

## 2012-10-27 DIAGNOSIS — Z950 Presence of cardiac pacemaker: Secondary | ICD-10-CM

## 2012-10-27 DIAGNOSIS — N182 Chronic kidney disease, stage 2 (mild): Secondary | ICD-10-CM | POA: Diagnosis not present

## 2012-10-27 DIAGNOSIS — R7881 Bacteremia: Secondary | ICD-10-CM

## 2012-10-27 DIAGNOSIS — Z7982 Long term (current) use of aspirin: Secondary | ICD-10-CM

## 2012-10-27 DIAGNOSIS — F0391 Unspecified dementia with behavioral disturbance: Secondary | ICD-10-CM

## 2012-10-27 DIAGNOSIS — I252 Old myocardial infarction: Secondary | ICD-10-CM

## 2012-10-27 DIAGNOSIS — E785 Hyperlipidemia, unspecified: Secondary | ICD-10-CM

## 2012-10-27 DIAGNOSIS — IMO0001 Reserved for inherently not codable concepts without codable children: Secondary | ICD-10-CM

## 2012-10-27 DIAGNOSIS — H9190 Unspecified hearing loss, unspecified ear: Secondary | ICD-10-CM | POA: Diagnosis present

## 2012-10-27 DIAGNOSIS — J209 Acute bronchitis, unspecified: Secondary | ICD-10-CM

## 2012-10-27 DIAGNOSIS — J441 Chronic obstructive pulmonary disease with (acute) exacerbation: Principal | ICD-10-CM

## 2012-10-27 DIAGNOSIS — I441 Atrioventricular block, second degree: Secondary | ICD-10-CM

## 2012-10-27 DIAGNOSIS — Y9210 Unspecified residential institution as the place of occurrence of the external cause: Secondary | ICD-10-CM | POA: Diagnosis not present

## 2012-10-27 DIAGNOSIS — F19921 Other psychoactive substance use, unspecified with intoxication with delirium: Secondary | ICD-10-CM | POA: Diagnosis not present

## 2012-10-27 DIAGNOSIS — Y92009 Unspecified place in unspecified non-institutional (private) residence as the place of occurrence of the external cause: Secondary | ICD-10-CM

## 2012-10-27 DIAGNOSIS — R000 Tachycardia, unspecified: Secondary | ICD-10-CM | POA: Diagnosis present

## 2012-10-27 DIAGNOSIS — N2889 Other specified disorders of kidney and ureter: Secondary | ICD-10-CM

## 2012-10-27 DIAGNOSIS — F03918 Unspecified dementia, unspecified severity, with other behavioral disturbance: Secondary | ICD-10-CM

## 2012-10-27 DIAGNOSIS — K851 Biliary acute pancreatitis without necrosis or infection: Secondary | ICD-10-CM

## 2012-10-27 DIAGNOSIS — M7530 Calcific tendinitis of unspecified shoulder: Secondary | ICD-10-CM | POA: Diagnosis present

## 2012-10-27 DIAGNOSIS — Z79899 Other long term (current) drug therapy: Secondary | ICD-10-CM

## 2012-10-27 DIAGNOSIS — M19019 Primary osteoarthritis, unspecified shoulder: Secondary | ICD-10-CM | POA: Diagnosis present

## 2012-10-27 DIAGNOSIS — I2109 ST elevation (STEMI) myocardial infarction involving other coronary artery of anterior wall: Secondary | ICD-10-CM

## 2012-10-27 DIAGNOSIS — K219 Gastro-esophageal reflux disease without esophagitis: Secondary | ICD-10-CM

## 2012-10-27 HISTORY — DX: Chronic obstructive pulmonary disease, unspecified: J44.9

## 2012-10-27 HISTORY — DX: Delirium due to known physiological condition: F05

## 2012-10-27 HISTORY — DX: Dementia in other diseases classified elsewhere, unspecified severity, without behavioral disturbance, psychotic disturbance, mood disturbance, and anxiety: F02.80

## 2012-10-27 HISTORY — DX: Alzheimer's disease, unspecified: G30.9

## 2012-10-27 HISTORY — DX: Other specified chronic obstructive pulmonary disease: J44.89

## 2012-10-27 LAB — COMPREHENSIVE METABOLIC PANEL
Albumin: 3.5 g/dL (ref 3.5–5.2)
Alkaline Phosphatase: 96 U/L (ref 39–117)
BUN: 22 mg/dL (ref 6–23)
Calcium: 9.3 mg/dL (ref 8.4–10.5)
Creatinine, Ser: 1.5 mg/dL — ABNORMAL HIGH (ref 0.50–1.35)
Potassium: 3.8 mEq/L (ref 3.5–5.1)
Total Protein: 6.4 g/dL (ref 6.0–8.3)

## 2012-10-27 LAB — CBC WITH DIFFERENTIAL/PLATELET
Eosinophils Relative: 0 % (ref 0–5)
HCT: 36.7 % — ABNORMAL LOW (ref 39.0–52.0)
Lymphocytes Relative: 22 % (ref 12–46)
Lymphs Abs: 2.2 10*3/uL (ref 0.7–4.0)
MCV: 94.6 fL (ref 78.0–100.0)
Monocytes Absolute: 0.7 10*3/uL (ref 0.1–1.0)
Neutro Abs: 7.3 10*3/uL (ref 1.7–7.7)
RBC: 3.88 MIL/uL — ABNORMAL LOW (ref 4.22–5.81)
WBC: 10.2 10*3/uL (ref 4.0–10.5)

## 2012-10-27 LAB — POCT I-STAT 3, ART BLOOD GAS (G3+)
Bicarbonate: 23.6 mEq/L (ref 20.0–24.0)
pCO2 arterial: 34.8 mmHg — ABNORMAL LOW (ref 35.0–45.0)
pH, Arterial: 7.439 (ref 7.350–7.450)
pO2, Arterial: 62 mmHg — ABNORMAL LOW (ref 80.0–100.0)

## 2012-10-27 LAB — TROPONIN I: Troponin I: <0.3 ng/mL (ref <0.30)

## 2012-10-27 MED ORDER — METHYLPREDNISOLONE SODIUM SUCC 125 MG IJ SOLR
125.0000 mg | Freq: Once | INTRAMUSCULAR | Status: AC
  Administered 2012-10-27: 125 mg via INTRAMUSCULAR

## 2012-10-27 MED ORDER — ALBUTEROL SULFATE (5 MG/ML) 0.5% IN NEBU
INHALATION_SOLUTION | RESPIRATORY_TRACT | Status: AC
  Administered 2012-10-27: 5 mg via RESPIRATORY_TRACT
  Filled 2012-10-27: qty 1

## 2012-10-27 MED ORDER — IPRATROPIUM BROMIDE 0.02 % IN SOLN
RESPIRATORY_TRACT | Status: AC
  Administered 2012-10-27: 0.5 mg via RESPIRATORY_TRACT
  Filled 2012-10-27: qty 2.5

## 2012-10-27 MED ORDER — LORAZEPAM 2 MG/ML IJ SOLN
0.5000 mg | Freq: Once | INTRAMUSCULAR | Status: AC
  Administered 2012-10-27: 0.5 mg via INTRAVENOUS

## 2012-10-27 MED ORDER — ALBUTEROL SULFATE (5 MG/ML) 0.5% IN NEBU
5.0000 mg | INHALATION_SOLUTION | Freq: Once | RESPIRATORY_TRACT | Status: AC
  Administered 2012-10-27: 5 mg via RESPIRATORY_TRACT

## 2012-10-27 MED ORDER — LEVOFLOXACIN IN D5W 750 MG/150ML IV SOLN
750.0000 mg | Freq: Once | INTRAVENOUS | Status: AC
  Administered 2012-10-27: 750 mg via INTRAVENOUS
  Filled 2012-10-27: qty 150

## 2012-10-27 MED ORDER — LORAZEPAM 2 MG/ML IJ SOLN
INTRAMUSCULAR | Status: AC
  Administered 2012-10-27: 0.5 mg via INTRAVENOUS
  Filled 2012-10-27: qty 1

## 2012-10-27 MED ORDER — ALBUTEROL (5 MG/ML) CONTINUOUS INHALATION SOLN
10.0000 mg/h | INHALATION_SOLUTION | Freq: Once | RESPIRATORY_TRACT | Status: AC
  Administered 2012-10-27: 10 mg/h via RESPIRATORY_TRACT
  Filled 2012-10-27: qty 20
  Filled 2012-10-27: qty 0.5

## 2012-10-27 MED ORDER — METHYLPREDNISOLONE SODIUM SUCC 125 MG IJ SOLR
125.0000 mg | Freq: Once | INTRAMUSCULAR | Status: DC
  Filled 2012-10-27: qty 2

## 2012-10-27 MED ORDER — LIDOCAINE HCL 2 % IJ SOLN
INTRAMUSCULAR | Status: AC
  Administered 2012-10-27: 23:00:00
  Filled 2012-10-27: qty 20

## 2012-10-27 MED ORDER — IPRATROPIUM BROMIDE 0.02 % IN SOLN
0.5000 mg | Freq: Once | RESPIRATORY_TRACT | Status: AC
  Administered 2012-10-27: 0.5 mg via RESPIRATORY_TRACT

## 2012-10-27 NOTE — ED Notes (Signed)
ekg completed

## 2012-10-27 NOTE — ED Notes (Signed)
Report given to [name] with [facility]

## 2012-10-27 NOTE — ED Notes (Signed)
Increased work of breathing, pt diaphoretic, hr 120's, per md pt placed on bipap

## 2012-10-27 NOTE — ED Notes (Signed)
Multiple attempts at iv insertion by 3 different RN's unsuccessful, Md aware

## 2012-10-27 NOTE — ED Notes (Signed)
Attempted IV x 3 unsuccessfully. MD made aware.

## 2012-10-27 NOTE — ED Notes (Signed)
Sob. Wheezing. No resp distress. Was given Albuterol treatments x 3 today per wife.

## 2012-10-27 NOTE — ED Notes (Signed)
Blood cultures sent prior to iv antibiotic use

## 2012-10-27 NOTE — ED Notes (Signed)
MD at bedside. 

## 2012-10-27 NOTE — ED Provider Notes (Addendum)
History  This chart was scribed for Loren Racer, MD by Shari Heritage, ED Scribe. The patient was seen in room MH11/MH11. Patient's care was started at 1943.  CSN: [id number]  Arrival date & time 10/27/12  1919   First MD Initiated Contact with Patient 10/27/12 1943      Chief Complaint  Patient presents with  . Shortness of Breath     Patient is a 79 y.o. male presenting with shortness of breath. The history is provided by the patient. No language interpreter was used.  Shortness of Breath  Episode onset: A few days. The onset was sudden. The problem occurs continuously. Nothing relieves the symptoms. Associated symptoms include cough, shortness of breath and wheezing. Pertinent negatives include no chest pain and no fever.    HPI Comments: [name] is a 79 y.o. male with history of COPD who presents to the Emergency Department complaining of increased shortness of breath and wheezing over the past few days. There is associated productive cough with yellow sputum. Patient also reports LUQ abdominal discomfort with cough. Patient had 3 albuterol treatments today. Patient is not on oxygen at home. Patient denies fever, chest pain or leg swelling. Other medical history includes MI, diabetes, HTN, TIA, GERD and hyperlipidemia.   Past Medical History  Diagnosis Date  . Chest discomfort   . Coronary artery disease   . Diabetes mellitus   . Hypertension   . Hyperlipidemia   . Obesities, morbid   . Gallstone pancreatitis     recurrent  . TIA (transient ischemic attack)   . Bacteremia due to vancomycin resistant Enterococcus   . GERD (gastroesophageal reflux disease)   . Hearing impairment   . Myocardial infarction, anterior wall, subsequent care   . Second degree AV block, Mobitz type II     s/p PPM by JA 4/12  . ARF (acute renal failure)   . Cholangitis   . Duodenal ulcer   . GI bleed   . Anemia associated with acute blood loss     Past Surgical History  Procedure  Date  . Cholecystectomy   . Pacemaker placement 01/01/11    implanted by JA for mobitz II AV block  . Pancreatic stent     No family history on file.  History  Substance Use Topics  . Smoking status: Former Smoker -- 1.0 packs/day for 57 years    Types: Cigarettes    Quit date: 01/12/1996  . Smokeless tobacco: Never Used  . Alcohol Use: No      Review of Systems  Constitutional: Negative for fever.  Respiratory: Positive for cough, shortness of breath and wheezing.   Cardiovascular: Negative for chest pain and leg swelling.  All other systems reviewed and are negative.    Allergies  Doxycycline; Statins; and Penicillins  Home Medications   Current Outpatient Rx  Name  Route  Sig  Dispense  Refill  . ALBUTEROL SULFATE HFA 108 (90 BASE) MCG/ACT IN AERS   Inhalation   Inhale 2 puffs into the lungs every 6 (six) hours as needed for wheezing.   1 Inhaler   2   . ASPIRIN 325 MG PO TABS   Oral   Take 162.5 mg by mouth daily.          Marland Kitchen BENAZEPRIL HCL 20 MG PO TABS   Oral   Take 20 mg by mouth every morning.         [place]   Inhalation   Inhale 1  puff into the lungs 2 (two) times daily. Strength unknown. [facility] has no record. 3 month supply from MD per wife.         . DONEPEZIL HCL 5 MG PO TABS   Oral   Take 5 mg by mouth daily.         Marland Kitchen EZETIMIBE 10 MG PO TABS   Oral   Take 10 mg by mouth daily.          Marland Kitchen FOLIC ACID 1 MG PO TABS   Oral   Take 1 mg by mouth every morning.         Marland Kitchen HYDROCHLOROTHIAZIDE 25 MG PO TABS   Oral   Take 12.5 mg by mouth every morning.          Marland Kitchen MAGNESIUM 500 MG PO TABS   Oral   Take 1 tablet by mouth daily.          Marland Kitchen METOPROLOL TARTRATE 50 MG PO TABS   Oral   Take 1 tablet (50 mg total) by mouth 2 (two) times daily.   60 tablet   1   . CENTRUM SILVER PO   Oral   Take 1 tablet by mouth every morning.          . ICAPS PO   Oral   Take 1 tablet by mouth daily.         Marland Kitchen NITROGLYCERIN 0.4  MG SL SUBL   Sublingual   Place 0.4 mg under the tongue every 5 (five) minutes as needed. For chest pain         . OMEPRAZOLE 20 MG PO CPDR   Oral   Take 20 mg by mouth 2 (two) times daily.          Marland Kitchen POLYETHYLENE GLYCOL 3350 PO PACK   Oral   Take 17 g by mouth daily.         Marland Kitchen PREDNISONE 20 MG PO TABS   Oral   Take 2 tablets (40 mg total) by mouth daily.   8 tablet   0   . SIMVASTATIN 20 MG PO TABS   Oral   Take 20 mg by mouth daily.          Marland Kitchen TIOTROPIUM BROMIDE MONOHYDRATE 18 MCG IN CAPS   Inhalation   Place 1 capsule (18 mcg total) into inhaler and inhale daily.   30 capsule   12     Triage Vitals: BP 114/69  Pulse 111  Temp 98.2 F (36.8 C) (Oral)  Resp 24  SpO2 94%  Physical Exam  Constitutional: He is oriented to person, place, and time. He appears well-developed and well-nourished.  HENT:  Head: Normocephalic.  Mouth/Throat: Oropharynx is clear and moist.  Eyes: Conjunctivae normal and EOM are normal. Pupils are equal, round, and reactive to light.  Neck: Normal range of motion. Neck supple.  Cardiovascular: Normal rate, regular rhythm and normal heart sounds.   Pulmonary/Chest: He has wheezes.       Inspriatory and expiratory wheezes. Increased work of breathing.  Abdominal: Soft. Normal appearance. He exhibits distension (mild). There is Tenderness: mild..       Tenderness to LUQ abdominal wall.  Musculoskeletal: Normal range of motion.  Neurological: He is alert and oriented to person, place, and time.  Skin: Skin is warm and dry.  Psychiatric: He has a normal mood and affect. His behavior is normal.    ED Course  Procedures (including critical care time) DIAGNOSTIC STUDIES: Oxygen  Saturation is 94% on room air, adequate by my interpretation.    COORDINATION OF CARE: 7:51 PM- Patient informed of current plan for treatment and evaluation and agrees with plan at this time.   9:27 PM- Patient still wheezing. O2 sats dropped to 91% after  ambulating. Patient's PCP is with [facility], but patient would like to be admitted to [facility]. Will page hospitalist regarding admission.  9:50 PM- Spoke with Dr. Fredrich Romans of [facility] regarding patient's case. He will accept patient to [facility] for COPD exacerbation. Will attempt EJ.  Labs Reviewed  COMPREHENSIVE METABOLIC PANEL - Abnormal; Notable for the following:    Glucose, Bld 130 (*)     Creatinine, Ser 1.50 (*)     GFR calc non Af Amer 41 (*)     GFR calc Af Amer 48 (*)     All other components within normal limits  POCT I-STAT 3, BLOOD GAS (G3+) - Abnormal; Notable for the following:    pCO2 arterial 34.8 (*)     pO2, Arterial 62.0 (*)     All other components within normal limits  TROPONIN I  BLOOD GAS, ARTERIAL    Dg Chest Port 1 View  10/27/2012  *RADIOLOGY REPORT*  Clinical Data: Wheezing. Shortness of breath.  PORTABLE CHEST - 1 VIEW  Comparison: 09/07/2012.  Findings: Sequential pacemaker enters from the left.  Leads appear to be in a similar position although evaluation limited by motion.  Cardiomegaly.  Mild pulmonary vascular prominence.  Right base subsegmental atelectasis.  No gross pneumothorax.  No segmental consolidation.  Mildly tortuous aorta.  IMPRESSION: Cardiomegaly and mild pulmonary vascular prominence.  Right base subsegmental atelectasis.  Pacemaker in place.  Slightly tortuous aorta.   Original Report Authenticated By: Lacy Duverney, M.D.      1. COPD with acute exacerbation     CENTRAL LINE Performed by: Ranae Palms, Jaskirat Zertuche Consent: The procedure was performed in an emergent situation. Required items: required blood products, implants, devices, and special equipment available Patient identity confirmed: arm band and provided demographic data Time out: Immediately prior to procedure a "time out" was called to verify the correct patient, procedure, equipment, support staff and site/side marked as required. Indications: vascular  access Anesthesia: local infiltration Local anesthetic: lidocaine 1% with epinephrine Anesthetic total: 5ml Patient sedated: no Preparation: skin prepped with 2% chlorhexidine Skin prep agent dried: skin prep agent completely dried prior to procedure Sterile barriers: all five maximum sterile barriers used - cap, mask, sterile gown, sterile gloves, and large sterile sheet Hand hygiene: hand hygiene performed prior to central venous catheter insertion  Location details: Pt unable to tolerate lying flat and with history of COPD chose to place L femoral CVL.   Catheter type: triple lumen Catheter size: 8 Fr Pre-procedure: landmarks identified Ultrasound guidance: Yes Successful placement: yes Post-procedure: line sutured and dressing applied Assessment: blood return through all parts, free fluid flow, placement verified by x-ray and no pneumothorax on x-ray Patient tolerance: Patient tolerated the procedure well with no immediate complications.   Date: 10/27/2012  Rate: 121  Rhythm: sinus tachycardia  QRS Axis: left  Intervals: normal  ST/T Wave abnormalities: nonspecific T wave changes  Conduction Disutrbances:none  Narrative Interpretation:   Old EKG Reviewed: none available   MDM  I personally performed the services described in this documentation, which was scribed in my presence. The recorded information has been reviewed and is accurate.  Discussed with Dr Delford Field who accepted pt to ICU.   CRITICAL CARE Performed  byRanae Palms, Kanye Depree   Total critical care time: 30  Critical care time was exclusive of separately billable procedures and treating other patients.  Critical care was necessary to treat or prevent imminent or life-threatening deterioration.  Critical care was time spent personally by me on the following activities: development of treatment plan with patient and/or surrogate as well as nursing, discussions with consultants, evaluation of patient's response to  treatment, examination of patient, obtaining history from patient or surrogate, ordering and performing treatments and interventions, ordering and review of laboratory studies, ordering and review of radiographic studies, pulse oximetry and re-evaluation of patient's condition.   Loren Racer, MD 10/27/12 [id number]  Loren Racer, MD 10/27/12 [phone]

## 2012-10-27 NOTE — ED Notes (Signed)
Patient's wife lois notified of patient's change in condition and that he would be going to icu, pt's wife stated she would call hospital in am and determine what room pt went to

## 2012-10-27 NOTE — ED Notes (Signed)
Pt alert, oriented to self, requesting bipap mask come off, explained [place] of bipap and why he had it on. Pt ok with leaving mask on

## 2012-10-27 NOTE — ED Notes (Signed)
Pt becoming combative, disoriented to place, trying to pull off bipap, and central line off, md notified and medications given,

## 2012-10-27 NOTE — Progress Notes (Signed)
Pt taken off bipap for now. Pt sats 96% on room air. [facility] to transport pt to [facility].

## 2012-10-27 NOTE — ED Notes (Signed)
Pt removed from bipap, pt sats 96% ra

## 2012-10-28 DIAGNOSIS — J441 Chronic obstructive pulmonary disease with (acute) exacerbation: Principal | ICD-10-CM

## 2012-10-28 DIAGNOSIS — I5022 Chronic systolic (congestive) heart failure: Secondary | ICD-10-CM

## 2012-10-28 DIAGNOSIS — E119 Type 2 diabetes mellitus without complications: Secondary | ICD-10-CM

## 2012-10-28 DIAGNOSIS — I10 Essential (primary) hypertension: Secondary | ICD-10-CM

## 2012-10-28 LAB — BASIC METABOLIC PANEL
CO2: 19 mEq/L (ref 19–32)
Calcium: 8.6 mg/dL (ref 8.4–10.5)
GFR calc non Af Amer: 39 mL/min — ABNORMAL LOW (ref >90)
Potassium: 3.3 mEq/L — ABNORMAL LOW (ref 3.5–5.1)
Sodium: 137 mEq/L (ref 135–145)

## 2012-10-28 LAB — CBC
MCH: 31.1 pg (ref 26.0–34.0)
MCHC: 33.1 g/dL (ref 30.0–36.0)
Platelets: 173 10*3/uL (ref 150–400)
RBC: 3.76 MIL/uL — ABNORMAL LOW (ref 4.22–5.81)

## 2012-10-28 LAB — PRO B NATRIURETIC PEPTIDE: Pro B Natriuretic peptide (BNP): 250 pg/mL (ref 0–450)

## 2012-10-28 LAB — MRSA PCR SCREENING: MRSA by PCR: NEGATIVE

## 2012-10-28 LAB — GLUCOSE, CAPILLARY
Glucose-Capillary: 289 mg/dL — ABNORMAL HIGH (ref 70–99)
Glucose-Capillary: 143 mg/dL — ABNORMAL HIGH (ref 70–99)

## 2012-10-28 MED ORDER — POTASSIUM CHLORIDE CRYS ER 20 MEQ PO TBCR
40.0000 meq | EXTENDED_RELEASE_TABLET | Freq: Once | ORAL | Status: AC
  Administered 2012-10-28: 40 meq via ORAL
  Filled 2012-10-28: qty 2

## 2012-10-28 MED ORDER — SIMVASTATIN 20 MG PO TABS
20.0000 mg | ORAL_TABLET | Freq: Every day | ORAL | Status: DC
  Administered 2012-10-28 – 2012-11-03 (×7): 20 mg via ORAL
  Filled 2012-10-28 (×8): qty 1

## 2012-10-28 MED ORDER — MAGNESIUM GLUCONATE 500 MG PO TABS
500.0000 mg | ORAL_TABLET | Freq: Every day | ORAL | Status: DC
  Administered 2012-10-28 – 2012-11-04 (×8): 500 mg via ORAL
  Filled 2012-10-28 (×8): qty 1

## 2012-10-28 MED ORDER — POLYETHYLENE GLYCOL 3350 17 G PO PACK
17.0000 g | PACK | Freq: Every day | ORAL | Status: DC
  Administered 2012-10-28 – 2012-11-04 (×8): 17 g via ORAL
  Filled 2012-10-28 (×8): qty 1

## 2012-10-28 MED ORDER — METOPROLOL TARTRATE 50 MG PO TABS
50.0000 mg | ORAL_TABLET | Freq: Two times a day (BID) | ORAL | Status: DC
  Administered 2012-10-28 – 2012-11-04 (×16): 50 mg via ORAL
  Filled 2012-10-28 (×19): qty 1

## 2012-10-28 MED ORDER — LEVALBUTEROL HCL 0.63 MG/3ML IN NEBU
0.6300 mg | INHALATION_SOLUTION | RESPIRATORY_TRACT | Status: DC | PRN
  Administered 2012-10-28 – 2012-11-04 (×5): 0.63 mg via RESPIRATORY_TRACT
  Filled 2012-10-28: qty 3

## 2012-10-28 MED ORDER — BENAZEPRIL HCL 20 MG PO TABS
20.0000 mg | ORAL_TABLET | Freq: Every morning | ORAL | Status: DC
  Administered 2012-10-28 – 2012-10-29 (×2): 20 mg via ORAL
  Filled 2012-10-28 (×2): qty 1

## 2012-10-28 MED ORDER — ENOXAPARIN SODIUM 40 MG/0.4ML ~~LOC~~ SOLN
40.0000 mg | SUBCUTANEOUS | Status: DC
  Administered 2012-10-28 – 2012-11-04 (×8): 40 mg via SUBCUTANEOUS
  Filled 2012-10-28 (×8): qty 0.4

## 2012-10-28 MED ORDER — DONEPEZIL HCL 5 MG PO TABS
5.0000 mg | ORAL_TABLET | Freq: Every day | ORAL | Status: DC
  Administered 2012-10-28 – 2012-11-04 (×8): 5 mg via ORAL
  Filled 2012-10-28 (×8): qty 1

## 2012-10-28 MED ORDER — LEVALBUTEROL HCL 0.63 MG/3ML IN NEBU
0.6300 mg | INHALATION_SOLUTION | Freq: Four times a day (QID) | RESPIRATORY_TRACT | Status: DC
  Administered 2012-10-28: 0.63 mg via RESPIRATORY_TRACT
  Filled 2012-10-28 (×6): qty 3

## 2012-10-28 MED ORDER — ACETAMINOPHEN 325 MG PO TABS
650.0000 mg | ORAL_TABLET | Freq: Once | ORAL | Status: AC
  Administered 2012-10-28: 650 mg via ORAL
  Filled 2012-10-28: qty 2

## 2012-10-28 MED ORDER — METHYLPREDNISOLONE SODIUM SUCC 125 MG IJ SOLR
60.0000 mg | Freq: Every day | INTRAMUSCULAR | Status: DC
  Administered 2012-10-28: 60 mg via INTRAVENOUS
  Filled 2012-10-28 (×2): qty 0.96

## 2012-10-28 MED ORDER — LEVALBUTEROL HCL 0.63 MG/3ML IN NEBU
0.6300 mg | INHALATION_SOLUTION | Freq: Four times a day (QID) | RESPIRATORY_TRACT | Status: DC
  Administered 2012-10-28 – 2012-10-29 (×3): 0.63 mg via RESPIRATORY_TRACT
  Filled 2012-10-28 (×8): qty 3

## 2012-10-28 MED ORDER — EZETIMIBE 10 MG PO TABS
10.0000 mg | ORAL_TABLET | Freq: Every day | ORAL | Status: DC
  Administered 2012-10-28 – 2012-11-04 (×8): 10 mg via ORAL
  Filled 2012-10-28 (×8): qty 1

## 2012-10-28 MED ORDER — IPRATROPIUM BROMIDE 0.02 % IN SOLN
0.5000 mg | Freq: Four times a day (QID) | RESPIRATORY_TRACT | Status: DC
  Administered 2012-10-28 – 2012-10-29 (×4): 0.5 mg via RESPIRATORY_TRACT
  Filled 2012-10-28 (×4): qty 2.5

## 2012-10-28 MED ORDER — GUAIFENESIN 200 MG PO TABS
200.0000 mg | ORAL_TABLET | ORAL | Status: DC | PRN
  Administered 2012-10-28: 200 mg via ORAL
  Filled 2012-10-28 (×2): qty 1

## 2012-10-28 MED ORDER — SODIUM CHLORIDE 0.9 % IV SOLN: 250.0000 mL | INTRAVENOUS | Status: DC | PRN

## 2012-10-28 MED ORDER — TRAZODONE HCL 50 MG PO TABS
50.0000 mg | ORAL_TABLET | Freq: Every evening | ORAL | Status: DC | PRN
  Administered 2012-10-28: 50 mg via ORAL
  Filled 2012-10-28 (×2): qty 1

## 2012-10-28 MED ORDER — SODIUM CHLORIDE 0.9 % IV SOLN
INTRAVENOUS | Status: AC
  Administered 2012-10-28: 14:00:00 via INTRAVENOUS
  Administered 2012-10-28: 1000 mL via INTRAVENOUS

## 2012-10-28 MED ORDER — FOLIC ACID 1 MG PO TABS
1.0000 mg | ORAL_TABLET | Freq: Every morning | ORAL | Status: DC
  Administered 2012-10-28 – 2012-11-04 (×8): 1 mg via ORAL
  Filled 2012-10-28 (×8): qty 1

## 2012-10-28 MED ORDER — LEVOFLOXACIN IN D5W 750 MG/150ML IV SOLN
750.0000 mg | INTRAVENOUS | Status: DC
  Filled 2012-10-28: qty 150

## 2012-10-28 MED ORDER — INSULIN ASPART 100 UNIT/ML ~~LOC~~ SOLN
8.0000 [IU] | Freq: Once | SUBCUTANEOUS | Status: AC
  Administered 2012-10-28: 8 [IU] via SUBCUTANEOUS

## 2012-10-28 MED ORDER — INSULIN ASPART 100 UNIT/ML ~~LOC~~ SOLN
0.0000 [IU] | Freq: Three times a day (TID) | SUBCUTANEOUS | Status: DC
  Administered 2012-10-28 (×2): 3 [IU] via SUBCUTANEOUS
  Administered 2012-10-28 – 2012-10-29 (×3): 2 [IU] via SUBCUTANEOUS
  Administered 2012-10-30: 3 [IU] via SUBCUTANEOUS
  Administered 2012-10-30: 2 [IU] via SUBCUTANEOUS
  Administered 2012-10-31 (×2): 3 [IU] via SUBCUTANEOUS
  Administered 2012-10-31: 2 [IU] via SUBCUTANEOUS
  Administered 2012-11-01: 8 [IU] via SUBCUTANEOUS
  Administered 2012-11-01: 5 [IU] via SUBCUTANEOUS
  Administered 2012-11-02: 2 [IU] via SUBCUTANEOUS
  Administered 2012-11-02: 5 [IU] via SUBCUTANEOUS
  Administered 2012-11-02 – 2012-11-03 (×3): 3 [IU] via SUBCUTANEOUS
  Administered 2012-11-04: 2 [IU] via SUBCUTANEOUS
  Administered 2012-11-04: 5 [IU] via SUBCUTANEOUS

## 2012-10-28 MED ORDER — HALOPERIDOL LACTATE 5 MG/ML IJ SOLN
5.0000 mg | Freq: Once | INTRAMUSCULAR | Status: AC
  Administered 2012-10-28: 5 mg via INTRAVENOUS
  Filled 2012-10-28 (×2): qty 1

## 2012-10-28 MED ORDER — PANTOPRAZOLE SODIUM 40 MG PO TBEC
40.0000 mg | DELAYED_RELEASE_TABLET | Freq: Every day | ORAL | Status: DC
  Administered 2012-10-28 – 2012-11-04 (×8): 40 mg via ORAL
  Filled 2012-10-28 (×10): qty 1

## 2012-10-28 MED ORDER — ASPIRIN 81 MG PO CHEW
162.5000 mg | CHEWABLE_TABLET | Freq: Every day | ORAL | Status: DC
  Administered 2012-10-28: 162.5 mg via ORAL
  Filled 2012-10-28: qty 2

## 2012-10-28 MED ORDER — TIOTROPIUM BROMIDE MONOHYDRATE 18 MCG IN CAPS
18.0000 ug | ORAL_CAPSULE | Freq: Every day | RESPIRATORY_TRACT | Status: DC
  Administered 2012-10-28: 18 ug via RESPIRATORY_TRACT
  Filled 2012-10-28 (×2): qty 5

## 2012-10-28 NOTE — Progress Notes (Signed)
Pt noted to have increased work of breathing and expiratory wheezing. RT at bedside giving breathing tx. MD notified and stated he will come to bedside to assess. Will continue to monitor pt closely.  Juliane Lack, RN

## 2012-10-28 NOTE — Progress Notes (Signed)
Received pt from 2100 awake alert oriented x3 with occasional episodes of confusion. Very poor historian, will wait for wife in am to provide additional info on patient. Tele still tachy. Maintained on isolation for vre. o2 2lnc in use, patient slightly SOB on exertion.observed pt closely

## 2012-10-28 NOTE — Progress Notes (Signed)
Pt had 13 beats of vtach while he was coughing hard. Vs taken pt asymptomatic. DR deuterling PCCM md made aware . No orders received. Observed pt closely. HR goes up when pt moves and coughs.

## 2012-10-28 NOTE — Clinical Documentation Improvement (Signed)
RESPIRATORY FAILURE DOCUMENTATION CLARIFICATION QUERY   THIS DOCUMENT IS NOT A PERMANENT PART OF THE MEDICAL RECORD  Please update your documentation within the medical record to reflect your response to this query.                                                                                     10/28/12  Canary Brim NP and/or Associates,  In a better effort to capture your patient's severity of illness, reflect appropriate length of stay and utilization of resources, a review of the patient medical record has revealed the following indicators:  [facility] MD and RN Notes indicate:  "9:27 PM- Patient still wheezing. O2 sats dropped to 91% after ambulating. Patient's PCP is with [facility], but patient would like to be admitted to [facility]. Will page hospitalist regarding admission.  9:50 PM- Spoke with Dr. Fredrich Romans of [facility] regarding patient's case. He will accept patient to [facility] for COPD exacerbation. Will attempt EJ." Dr. Ranae Palms [facility] Note  "Increased work of breathing, pt diaphoretic, hr 120's, per md pt placed on bipap" 10/27/12 2215 Cothren, RN  "Pt alert, oriented to self, requesting bipap mask come off, explained use of bipap and why he had it on. Pt ok with leaving mask on" 10/27/12 2247  Cothren, RN  "Patient's wife lois notified of patient's change in condition and that he would be going to icu, pt's wife stated she would call hospital in am and determine what room pt went to" 10/27/12 2304  Cothren, RN  "Discussed with Dr Delford Field who accepted pt to ICU." Dr. Ranae Palms [facility] Note   "Pt becoming combative, disoriented to place, trying to pull off bipap, and central line off, md notified and medications given," 10/27/12 2317 Cothren, RN   "Pt removed from bipap, pt sats 96% ra" 10/27/12 2341  Cothren, RN  "Pt taken off bipap for now. Pt sats 96% on room air. [facility]." 10/27/12 2341  Azucena Kuba, RRT   Respiratory Rate per [facility] Doc Flowsheet review during this time in the 20's  02 Sats per [facility] Doc Flowsheet review during this time in the low to mid 90's   Based on your clinical judgment, please document in the progress notes and discharge summary if a condition below provides greater specificity regarding the patient's respiratory status prior to transfer:   - Acute Respiratory Failure requiring Bipap prior to transfer, resolved   - Other Condition   - Unable to Clinically Determine   In responding to this query please exercise your independent judgment.    The fact that a query is asked, does not imply that any particular answer is desired or expected.  Reviewed: 10/29/12 - email from Canary Brim NP-" I did not care for Mr. Nordling prior to [facility] so I can not comment on his status prior to transfer."   Mathis Dad RN    Thank You,  Jerral Ralph  RN BSN CCDS Certified Clinical Documentation Specialist: Cell  [phone]  Health Information Management [place]   TO RESPOND TO THE THIS QUERY, FOLLOW THE INSTRUCTIONS  BELOW:  1. If needed, update documentation for the patient's encounter via the notes activity.  2. Access this query again and click edit on the In [employer].  3. After updating, or not, click F2 to complete all highlighted (required) fields concerning your review. Select "additional documentation in the medical record" OR "no additional documentation provided".  4. Click Sign note button.  5. The deficiency will fall out of your In Basket *Please let us know if you are not able to complete this workflow by phone or e-mail (listed below).

## 2012-10-28 NOTE — Progress Notes (Signed)
ANTIBIOTIC CONSULT NOTE - INITIAL  Pharmacy Consult for Levaquin Indication: COPD exacerbation  Allergies  Allergen Reactions  . Doxycycline     Unknown- patient doesn't remember  . Statins     unknown  . Penicillins Rash    Patient Measurements: Weight: 199 lb 15.3 oz (90.7 kg)  Vital Signs: Temp: 98.6 F (37 C) (02/03 2306) Temp src: Oral (02/03 1930) BP: 96/48 mmHg (02/03 2329) Pulse Rate: 118  (02/04 0100)  Labs:  Basename 10/27/12 2237 10/27/12 2013  WBC 10.2 --  HGB 12.2* --  PLT 193 --  LABCREA -- --  CREATININE -- 1.50*   Estimated Creatinine Clearance: 39.4 ml/min (by C-G formula based on Cr of 1.5).  Microbiology: No results found for this or any previous visit (from the past 720 hour(s)).  Medical History: Past Medical History  Diagnosis Date  . Chest discomfort   . Coronary artery disease   . Diabetes mellitus   . Hypertension   . Hyperlipidemia   . Obesities, morbid   . Gallstone pancreatitis     recurrent  . TIA (transient ischemic attack)   . Bacteremia due to vancomycin resistant Enterococcus   . GERD (gastroesophageal reflux disease)   . Hearing impairment   . Myocardial infarction, anterior wall, subsequent care   . Second degree AV block, Mobitz type II     s/p PPM by JA 4/12  . ARF (acute renal failure)   . Cholangitis   . Duodenal ulcer   . GI bleed   . Anemia associated with acute blood loss     Medications:  Prescriptions prior to admission  Medication Sig Dispense Refill  . albuterol (PROVENTIL HFA;VENTOLIN HFA) 108 (90 BASE) MCG/ACT inhaler Inhale 2 puffs into the lungs every 6 (six) hours as needed for wheezing.  1 Inhaler  2  . aspirin 325 MG tablet Take 162.5 mg by mouth daily.       . benazepril (LOTENSIN) 20 MG tablet Take 20 mg by mouth every morning.      . Budesonide-Formoterol Fumarate (SYMBICORT IN) Inhale 1 puff into the lungs 2 (two) times daily. Strength unknown. [facility] has no record. 3 month supply from MD per  wife.      . donepezil (ARICEPT) 5 MG tablet Take 5 mg by mouth daily.      Marland Kitchen ezetimibe (ZETIA) 10 MG tablet Take 10 mg by mouth daily.       . folic acid (FOLVITE) 1 MG tablet Take 1 mg by mouth every morning.      . hydrochlorothiazide 25 MG tablet Take 12.5 mg by mouth every morning.       . Magnesium 500 MG TABS Take 1 tablet by mouth daily.       . metoprolol (LOPRESSOR) 50 MG tablet Take 1 tablet (50 mg total) by mouth 2 (two) times daily.  60 tablet  1  . Multiple Vitamins-Minerals (CENTRUM SILVER PO) Take 1 tablet by mouth every morning.       . Multiple Vitamins-Minerals (ICAPS PO) Take 1 tablet by mouth daily.      . nitroGLYCERIN (NITROSTAT) 0.4 MG SL tablet Place 0.4 mg under the tongue every 5 (five) minutes as needed. For chest pain      . omeprazole (PRILOSEC) 20 MG capsule Take 20 mg by mouth 2 (two) times daily.       . polyethylene glycol (MIRALAX / GLYCOLAX) packet Take 17 g by mouth daily.      . predniSONE (  DELTASONE) 20 MG tablet Take 2 tablets (40 mg total) by mouth daily.  8 tablet  0  . simvastatin (ZOCOR) 20 MG tablet Take 20 mg by mouth daily.       Marland Kitchen tiotropium (SPIRIVA HANDIHALER) 18 MCG inhalation capsule Place 1 capsule (18 mcg total) into inhaler and inhale daily.  30 capsule  12   Scheduled:    . [COMPLETED] albuterol  5 mg Nebulization Once  . [COMPLETED] albuterol  10 mg/hr Nebulization Once  . enoxaparin (LOVENOX) injection  40 mg Subcutaneous Q24H  . insulin aspart  0-15 Units Subcutaneous TID WC  . [COMPLETED] insulin aspart  8 Units Subcutaneous Once  . [COMPLETED] ipratropium  0.5 mg Nebulization Once  . levalbuterol  0.63 mg Nebulization Q6H  . [COMPLETED] levofloxacin (LEVAQUIN) IV  750 mg Intravenous Once  . levofloxacin (LEVAQUIN) IV  750 mg Intravenous Q48H  . [COMPLETED] lidocaine      . [COMPLETED] LORazepam  0.5 mg Intravenous Once  . [COMPLETED] methylPREDNISolone (SOLU-MEDROL) injection  125 mg Intramuscular Once  . methylPREDNISolone  (SOLU-MEDROL) injection  60 mg Intravenous Daily  . [DISCONTINUED] methylPREDNISolone (SOLU-MEDROL) injection  125 mg Intravenous Once   Infusions:    . sodium chloride 1,000 mL (10/28/12 0125)    Assessment: 79yo male c/o worsening SOB and productive cough/wheezing for a few days, admitted for acute COPD exacerbation, to begin IV ABX.  Plan:  Will begin Levaquin 750mg  IV Q48H and monitor CBC and Cx.  Colleen Can PharmD BCPS 10/28/2012,1:32 AM

## 2012-10-28 NOTE — Progress Notes (Addendum)
PULMONARY  / CRITICAL CARE MEDICINE  Name: [name] MRN: [id number] DOB: November 20, 1926    ADMISSION DATE:  10/27/2012 CONSULTATION DATE:  10/28/2012  REFERRING MD :  Ranae Palms  CHIEF COMPLAINT:  Shortness of breath  BRIEF PATIENT DESCRIPTION: 79 y/o male with COPD was admitted on 2/3 from [facility] for increased shortness of breath presumably due to an AE of COPD.    SIGNIFICANT EVENTS / STUDIES:  2/4 - called by RN for significant wheezing, no distress  LINES / TUBES: 2/3 L Femoral CVL >>  CULTURES: 2/4 MRS PCR>>>neg  ANTIBIOTICS: 2/4 Levaquin (AE COPD) >>   SUBJECTIVE: Pt reports abd soreness from coughing, non-productive.  RN concerned regarding audible wheeze.  C/o R shoulder pain and arm pain with certain movements  VITAL SIGNS: Temp:  [97.6 F (36.4 C)-98.9 F (37.2 C)] 98.1 F (36.7 C) (02/04 1134) Pulse Rate:  [98-126] 103  (02/04 1134) Resp:  [20-28] 22  (02/04 0508) BP: (92-134)/(48-78) 126/72 mmHg (02/04 1134) SpO2:  [91 %-98 %] 97 % (02/04 1134) FiO2 (%):  [2 %-21 %] 2 % (02/04 1100) Weight:  [198 lb 10.2 oz (90.1 kg)-199 lb 15.3 oz (90.7 kg)] 198 lb 10.2 oz (90.1 kg) (02/04 0257)  VENTILATOR SETTINGS: Vent Mode:  [-]  FiO2 (%):  [2 %-21 %] 2 %  INTAKE / OUTPUT: Intake/Output      02/03 0701 - 02/04 0700 02/04 0701 - 02/05 0700   P.O. 240    I.V. (mL/kg) 418.8 (4.6)    Total Intake(mL/kg) 658.8 (7.3)    Urine (mL/kg/hr) 200 (0.1)    Total Output 200    Net +458.8           PHYSICAL EXAMINATION: Gen: chronically ill appearing, no acute distress HEENT: NCAT, PERRL, EOMi, OP clear, neck supple without masses PULM: Wheezing bilaterally CV: tachy, regular, no mgr, no JVD AB: BS+, soft, nontender, no hsm Ext: warm, trace edema, no clubbing, no cyanosis Derm: no rash or skin breakdown Neuro: Awake and alert, following commands and answering questions but very hard of hearing, MAEW  LABS:  Lab 10/28/12 0153 10/27/12 2237 10/27/12 2013 10/27/12 2005   HGB 11.7* 12.2* -- --  WBC 8.6 10.2 -- --  PLT 173 193 -- --  NA 137 -- 139 --  K 3.3* -- 3.8 --  CL 99 -- 100 --  CO2 19 -- 25 --  GLUCOSE 284* -- 130* --  BUN 24* -- 22 --  CREATININE 1.58* -- 1.50* --  CALCIUM 8.6 -- 9.3 --  MG -- -- -- --  PHOS -- -- -- --  AST -- -- 22 --  ALT -- -- 12 --  ALKPHOS -- -- 96 --  BILITOT -- -- 0.4 --  PROT -- -- 6.4 --  ALBUMIN -- -- 3.5 --  APTT -- -- -- --  INR -- -- -- --  LATICACIDVEN -- -- -- --  TROPONINI -- -- <0.30 --  PROCALCITON -- -- -- --  PROBNP 250.0 -- -- --  O2SATVEN -- -- -- --  PHART -- -- -- 7.439  PCO2ART -- -- -- 34.8*  PO2ART -- -- -- 62.0*    Lab 10/28/12 1109 10/28/12 0618 10/28/12 0055  GLUCAP 194* 143* 289*    2/3 CXR: mild cardiomegally, lungs clear, pacemaker noted  2/3 EKG: Sinus tach, LAD, no st wave changes  ASSESSMENT / PLAN:  PULMONARY A:  1) AE COPD>> based on increased wheezing and cough; heavy prior smoking  history  P:   -solumedrol 60mg  daily, taper quickly given history of sundowning with steroids in past -xopenex & atrovent -hold spiriva -O2 as needed -levaquin -needs outpatient PFT's -repeat film in am  CARDIOVASCULAR A:  1) Ischemic cardiomyopathy- Diastolic dysfunction based on 1610 Echo 2) CAD 3) Tachycardia - presumably due to many rounds of albuterol at home and in ED  P:  -hold albuterol -continue home ASA, B-blocker, statin, ACE-in -tele -gentle IVF, monitor fluid status closely  RENAL A:  CKD, baseline Cr 1.3  P:  -gentle IVF, consider reduce in am 2/5 -hold home HCTZ for now -repeat BMET daily  GASTROINTESTINAL A:  Chronic constipation  P:   -miralax  HEMATOLOGIC A:  No acute issues  P:  -monitor for bleeding  INFECTIOUS A:  AE COPD  P:   -levaquin  ENDOCRINE A:  DM2 exacerbated by hyperglycemia  P:   -SSI and CBG  NEUROLOGIC A:  Dementia R shoulder and UE pain - not clear whether this is MSK pain vs neuropathic.   P:   -careful  with steroids -frequent orientation -continue home aricept -start w/u for RUE pain with R shoulder x-rays, may need CT scan c-spine or some other eval depending on results  TODAY'S SUMMARY:  79 y/o male with COPD who presents with an AE COPD.  Continued wheeze afternoon of 2/4.  Seems some component of upper airway in addition to lower wheeze.     Canary Brim, NP-C [facility] Pgr: [phone] or [phone]  10/28/2012, 11:48 AM  Levy Pupa, MD, PhD 10/28/2012, 3:36 PM [facility] [phone] or if no answer [phone]

## 2012-10-28 NOTE — H&P (Signed)
PULMONARY  / CRITICAL CARE MEDICINE  Name: [name] MRN: [id number] DOB: 29-Nov-1926    ADMISSION DATE:  10/27/2012 CONSULTATION DATE:  10/28/2012  REFERRING MD :  Ranae Palms  CHIEF COMPLAINT:  Shortness of breath  BRIEF PATIENT DESCRIPTION: 79 y/o male with COPD was admitted on 2/3 from [facility] for increased shortness of breath presumably due to an AE of COPD.    SIGNIFICANT EVENTS / STUDIES:    LINES / TUBES: 2/3 L Femoral CVL >>  CULTURES:   ANTIBIOTICS: 2/4 Levaquin (AE COPD) >>   HISTORY OF PRESENT ILLNESS:  79 y/o male with CAD, COPD, and DM was admitted on 2/3 for increased shortness of breath.  He stated that he had been more short of breath for two weeks as well as increasing cough.  He was extremely hard of hearing and has dementia so history was limited.  His wife stated that he had been fine over the last two weeks but on 2/3 he complained of increasing shortness of breath and requested albuterol three times during the day.  After he did not improve he asked her to take him to [facility].  She stated that he did not have chest pain or fever or chills.  He did have some "side soreness" due to all his coughing.   PAST MEDICAL HISTORY :  Past Medical History  Diagnosis Date  . Chest discomfort   . Coronary artery disease   . Diabetes mellitus   . Hypertension   . Hyperlipidemia   . Obesities, morbid   . Gallstone pancreatitis     recurrent  . TIA (transient ischemic attack)   . Bacteremia due to vancomycin resistant Enterococcus   . GERD (gastroesophageal reflux disease)   . Hearing impairment   . Myocardial infarction, anterior wall, subsequent care   . Second degree AV block, Mobitz type II     s/p PPM by JA 4/12  . ARF (acute renal failure)   . Cholangitis   . Duodenal ulcer   . GI bleed   . Anemia associated with acute blood loss    Past Surgical History  Procedure Date  . Cholecystectomy   . Pacemaker placement 01/01/11    implanted by JA for mobitz II AV  block  . Pancreatic stent    Prior to Admission medications   Medication Sig Start Date End Date Taking? Authorizing Provider  albuterol (PROVENTIL HFA;VENTOLIN HFA) 108 (90 BASE) MCG/ACT inhaler Inhale 2 puffs into the lungs every 6 (six) hours as needed for wheezing. 06/26/12   Stephani Police, PA  aspirin 325 MG tablet Take 162.5 mg by mouth daily.     Historical Provider, MD  benazepril (LOTENSIN) 20 MG tablet Take 20 mg by mouth every morning.    Historical Provider, MD  Budesonide-Formoterol Fumarate (SYMBICORT IN) Inhale 1 puff into the lungs 2 (two) times daily. Strength unknown. [facility] has no record. 3 month supply from MD per wife.    Historical Provider, MD  donepezil (ARICEPT) 5 MG tablet Take 5 mg by mouth daily.    Historical Provider, MD  ezetimibe (ZETIA) 10 MG tablet Take 10 mg by mouth daily.     Historical Provider, MD  folic acid (FOLVITE) 1 MG tablet Take 1 mg by mouth every morning.    Historical Provider, MD  hydrochlorothiazide 25 MG tablet Take 12.5 mg by mouth every morning.     Historical Provider, MD  Magnesium 500 MG TABS Take 1 tablet by mouth daily.  Historical Provider, MD  metoprolol (LOPRESSOR) 50 MG tablet Take 1 tablet (50 mg total) by mouth 2 (two) times daily. 06/26/12   Stephani Police, PA  Multiple Vitamins-Minerals (CENTRUM SILVER PO) Take 1 tablet by mouth every morning.     Historical Provider, MD  Multiple Vitamins-Minerals (ICAPS PO) Take 1 tablet by mouth daily.    Historical Provider, MD  nitroGLYCERIN (NITROSTAT) 0.4 MG SL tablet Place 0.4 mg under the tongue every 5 (five) minutes as needed. For chest pain    Historical Provider, MD  omeprazole (PRILOSEC) 20 MG capsule Take 20 mg by mouth 2 (two) times daily.     Historical Provider, MD  polyethylene glycol (MIRALAX / GLYCOLAX) packet Take 17 g by mouth daily.    Historical Provider, MD  predniSONE (DELTASONE) 20 MG tablet Take 2 tablets (40 mg total) by mouth daily. 09/07/12   Renne Crigler, PA   simvastatin (ZOCOR) 20 MG tablet Take 20 mg by mouth daily.  10/31/11 10/30/12  Peter M [place], MD  tiotropium (SPIRIVA HANDIHALER) 18 MCG inhalation capsule Place 1 capsule (18 mcg total) into inhaler and inhale daily. 06/26/12   Stephani Police, PA   Allergies  Allergen Reactions  . Doxycycline     Unknown- patient doesn't remember  . Statins     unknown  . Penicillins Rash    FAMILY HISTORY:  No family history on file. SOCIAL HISTORY:  reports that he quit smoking about 16 years ago. His smoking use included Cigarettes. He has a 57 pack-year smoking history. He has never used smokeless tobacco. He reports that he does not drink alcohol or use illicit drugs.  REVIEW OF SYSTEMS:   Gen: Denies fever, chills, weight change, fatigue, night sweats HEENT: Denies blurred vision, double vision, hearing loss, tinnitus, sinus congestion, rhinorrhea, sore throat, neck stiffness, dysphagia PULM: per hpi CV: Denies chest pain, edema, orthopnea, paroxysmal nocturnal dyspnea, palpitations GI: Denies abdominal pain, nausea, vomiting, diarrhea, hematochezia, melena, constipation, change in bowel habits GU: Denies dysuria, hematuria, polyuria, oliguria, urethral discharge Endocrine: Denies hot or cold intolerance, polyuria, polyphagia or appetite change Derm: Denies rash, dry skin, scaling or peeling skin change Heme: Denies easy bruising, bleeding, bleeding gums Neuro: Denies headache, numbness, weakness, slurred speech, loss of memory or consciousness   SUBJECTIVE:   VITAL SIGNS: Temp:  [98.2 F (36.8 C)-98.6 F (37 C)] 98.6 F (37 C) (02/03 2306) Pulse Rate:  [98-126] 118  (02/04 0100) Resp:  [20-28] 25  (02/04 0100) BP: (96-122)/(48-78) 96/48 mmHg (02/03 2329) SpO2:  [91 %-98 %] 95 % (02/04 0100) HEMODYNAMICS:   VENTILATOR SETTINGS:   INTAKE / OUTPUT: Intake/Output      02/03 0701 - 02/04 0700   Urine 200   Total Output 200   Net -200         PHYSICAL EXAMINATION: Gen:  chronically ill appearing, no acute distress HEENT: NCAT, PERRL, EOMi, OP clear, neck supple without masses PULM: Wheezing bilaterally CV: tachy, regular, no mgr, no JVD AB: BS+, soft, nontender, no hsm Ext: warm, trace edema, no clubbing, no cyanosis Derm: no rash or skin breakdown Neuro: Awake and alert, following commands and answering questions but very hard of hear, MAEW  LABS:  Lab 10/27/12 2237 10/27/12 2013 10/27/12 2005  HGB 12.2* -- --  WBC 10.2 -- --  PLT 193 -- --  NA -- 139 --  K -- 3.8 --  CL -- 100 --  CO2 -- 25 --  GLUCOSE -- 130* --  BUN -- 22 --  CREATININE -- 1.50* --  CALCIUM -- 9.3 --  MG -- -- --  PHOS -- -- --  AST -- 22 --  ALT -- 12 --  ALKPHOS -- 96 --  BILITOT -- 0.4 --  PROT -- 6.4 --  ALBUMIN -- 3.5 --  APTT -- -- --  INR -- -- --  LATICACIDVEN -- -- --  TROPONINI -- <0.30 --  PROCALCITON -- -- --  PROBNP -- -- --  O2SATVEN -- -- --  PHART -- -- 7.439  PCO2ART -- -- 34.8*  PO2ART -- -- 62.0*   No results found for this basename: GLUCAP:5 in the last 168 hours  2/3 CXR: mild cardiomegally, lungs clear, pacemaker noted  2/3 EKG: Sinus tach, LAD, no st wave changes  ASSESSMENT / PLAN:  PULMONARY A:  1) AE COPD>> based on increased wheezing and cough; heavy prior smoking history P:   -solumedrol 60mg  daily, taper quickly given history of sundowning with steroids in past -change albuterol to xopenex -spiriva -O2 as needed -levaquin -needs outpatient PFT's  CARDIOVASCULAR A:  1) Ischemic cardiomyopathy- Diastolic dysfunction based on 1610 Echo 2) CAD 3) Tachycardia, presumably due to many rounds of albuterol at home and in ED P:  -hold albuterol -continue home ASA, B-blocker, statin, ACE-in -tele -gentle IVF, monitor fluid status closely  RENAL A:  CKD, baseline Cr 1.3 P:  -gentle IVF -hold home HCTZ for now -repeat BMET daily  GASTROINTESTINAL A:  Chronic constipation P:   -miralax  HEMATOLOGIC A:  No acute  issues P:  -monitor for bleeding  INFECTIOUS A:  AE COPD P:   -levaquin  ENDOCRINE A:  DM2 exacerbated by hyperglycemia P:   -SSI and CBG  NEUROLOGIC A:  Dementia P:   -careful with steroids -frequent orientation -continue home aricept  TODAY'S SUMMARY:  79 y/o male with COPD who presents with an AE COPD.  Unclear why sent to ICU.  OK for tele.    Fonnie Jarvis [facility] Pager: [phone]  10/28/2012, 1:14 AM

## 2012-10-28 NOTE — Progress Notes (Signed)
eLink Physician-Brief Progress Note Patient Name: [name] DOB: 20-Aug-1927 MRN: [id number]  Date of Service  10/28/2012   HPI/Events of Note  Pt acutely delirious.  Hx of OBS  eICU Interventions  Give haldol times one dose 5mg  IVP   Intervention Category Major Interventions: Delirium, psychosis, severe agitation - evaluation and management  Shan Levans 10/28/2012, 8:48 PM

## 2012-10-29 ENCOUNTER — Inpatient Hospital Stay (HOSPITAL_COMMUNITY): Payer: Medicare Other

## 2012-10-29 LAB — GLUCOSE, CAPILLARY
Glucose-Capillary: 146 mg/dL — ABNORMAL HIGH (ref 70–99)
Glucose-Capillary: 133 mg/dL — ABNORMAL HIGH (ref 70–99)

## 2012-10-29 LAB — CBC
MCH: 31.8 pg (ref 26.0–34.0)
MCHC: 33.8 g/dL (ref 30.0–36.0)
MCV: 94.1 fL (ref 78.0–100.0)
Platelets: 215 10*3/uL (ref 150–400)
RDW: 14.1 % (ref 11.5–15.5)

## 2012-10-29 LAB — BASIC METABOLIC PANEL
BUN: 34 mg/dL — ABNORMAL HIGH (ref 6–23)
CO2: 22 mEq/L (ref 19–32)
Calcium: 9 mg/dL (ref 8.4–10.5)
Creatinine, Ser: 1.6 mg/dL — ABNORMAL HIGH (ref 0.50–1.35)
Glucose, Bld: 145 mg/dL — ABNORMAL HIGH (ref 70–99)

## 2012-10-29 MED ORDER — ASPIRIN 81 MG PO CHEW
162.0000 mg | CHEWABLE_TABLET | Freq: Every day | ORAL | Status: DC
  Administered 2012-10-29 – 2012-11-04 (×7): 162 mg via ORAL
  Filled 2012-10-29: qty 1
  Filled 2012-10-29: qty 2
  Filled 2012-10-29: qty 1
  Filled 2012-10-29 (×2): qty 2
  Filled 2012-10-29: qty 1
  Filled 2012-10-29 (×2): qty 2
  Filled 2012-10-29: qty 1
  Filled 2012-10-29: qty 2

## 2012-10-29 MED ORDER — LOSARTAN POTASSIUM 50 MG PO TABS
50.0000 mg | ORAL_TABLET | Freq: Every day | ORAL | Status: DC
  Administered 2012-10-29 – 2012-11-04 (×7): 50 mg via ORAL
  Filled 2012-10-29 (×8): qty 1

## 2012-10-29 MED ORDER — PREDNISONE 20 MG PO TABS
30.0000 mg | ORAL_TABLET | Freq: Every day | ORAL | Status: DC
  Administered 2012-10-30 – 2012-11-04 (×6): 30 mg via ORAL
  Filled 2012-10-29 (×7): qty 1

## 2012-10-29 MED ORDER — IPRATROPIUM BROMIDE 0.02 % IN SOLN
0.5000 mg | Freq: Four times a day (QID) | RESPIRATORY_TRACT | Status: DC
  Administered 2012-10-29 – 2012-11-03 (×21): 0.5 mg via RESPIRATORY_TRACT
  Filled 2012-10-29 (×22): qty 2.5

## 2012-10-29 MED ORDER — LEVOFLOXACIN 500 MG PO TABS: 500.0000 mg | ORAL_TABLET | Freq: Every day | ORAL | Status: DC

## 2012-10-29 MED ORDER — LEVOFLOXACIN 750 MG PO TABS
750.0000 mg | ORAL_TABLET | ORAL | Status: AC
  Administered 2012-10-29 – 2012-10-31 (×2): 750 mg via ORAL
  Filled 2012-10-29 (×2): qty 1

## 2012-10-29 MED ORDER — LEVALBUTEROL HCL 0.63 MG/3ML IN NEBU
0.6300 mg | INHALATION_SOLUTION | Freq: Four times a day (QID) | RESPIRATORY_TRACT | Status: DC
  Administered 2012-10-29 – 2012-11-03 (×21): 0.63 mg via RESPIRATORY_TRACT
  Filled 2012-10-29 (×24): qty 3

## 2012-10-29 NOTE — Progress Notes (Signed)
Notified by night nurse that during the night patient became agitated and combative. Patient pulled out femoral CVL line and was told that MD was made aware of this and that night nurse was able to get a one time dose of Haldol that was given to help calm patient. Patient was also given trazodone at that time to help patient to rest. Both medications did not take effect until this morning and therefore prior to dayshift, night nurse was able to get order from MD to have a sitter available for safety of patient and staff. At the time night nurse got order for sitter, no staff was available to sit with patient until change of shift in the morning. At the start of dayshift this morning, patient was resting and has been resting most of the day and is very arousable if awaken. Patient has not complained of any pain or discomfort and has been calm with appropriate behavior. By the end of the shift patient may experience sundowning for he has a history of Dementia. Will continue to monitor to end of shift.

## 2012-10-29 NOTE — Progress Notes (Signed)
Patient very combative and agitated. He is aggressively trying to get out of bed. Nurse Animator at bedside provided assistance. However, patient is still agitated. Paged MD and ordered for Haldol now dose and a sitter. Called wife as well to update about the incident. Will monitor patient frequently.

## 2012-10-29 NOTE — Progress Notes (Signed)
While making rounds, noticed that the patient was pulling something underneath his blanket. Patient was assessed and saw that central line was pulled out (central line catheter tip is still intact). Informed Charge nurse immediately; cleaned the site and placed a pressure dressing. Paged MD and IV team to update about the incident. Will monitor patient accordingly.

## 2012-10-29 NOTE — Progress Notes (Signed)
PULMONARY  / CRITICAL CARE MEDICINE  Name: [name] MRN: [id number] DOB: May 30, 1927    ADMISSION DATE:  10/27/2012 CONSULTATION DATE:  10/28/2012  REFERRING MD :  Ranae Palms  CHIEF COMPLAINT:  Shortness of breath  BRIEF PATIENT DESCRIPTION: 79 y/o male with COPD was admitted on 2/3 from [facility] for increased shortness of breath presumably due to an AE of COPD.    SIGNIFICANT EVENTS / STUDIES:  2/4 - called by RN for significant wheezing, no distress 2/4 > R shoulder films > calcific tendonitis of rotator cuff, progressive OA of the AC joint, no fractures  LINES / TUBES: 2/3 L Femoral CVL >>  CULTURES: 2/4 MRS PCR>>>neg  ANTIBIOTICS: 2/4 Levaquin (AE COPD) >>   SUBJECTIVE: R shoulder films performed as above Episode of delirium last pm 2/4, received haldol x 1  VITAL SIGNS: Temp:  [97 F (36.1 C)-98.1 F (36.7 C)] 97 F (36.1 C) (02/05 0522) Pulse Rate:  [86-130] 86  (02/05 1123) Resp:  [18] 18  (02/05 0529) BP: (117-162)/(68-88) 117/68 mmHg (02/05 1123) SpO2:  [95 %-99 %] 99 % (02/05 1123) Weight:  [90 kg (198 lb 6.6 oz)] 90 kg (198 lb 6.6 oz) (02/05 0522)  VENTILATOR SETTINGS:    INTAKE / OUTPUT: Intake/Output      02/04 0701 - 02/05 0700 02/05 0701 - 02/06 0700   P.O. 780 240   I.V. (mL/kg) 812.5 (9)    Total Intake(mL/kg) 1592.5 (17.7) 240 (2.7)   Urine (mL/kg/hr) 800 (0.4)    Total Output 800    Net +792.5 +240        Stool Occurrence 1 x 1 x     PHYSICAL EXAMINATION: Gen: chronically ill appearing, no acute distress HEENT: NCAT, PERRL, EOMi, OP clear, neck supple without masses, decreased hearing PULM: UA noise, decreased BS CV: tachy, regular, no mgr, no JVD AB: BS+, soft, nontender, no hsm Ext: warm, trace edema, no clubbing, no cyanosis Derm: no rash or skin breakdown Neuro: Awake and alert, following commands and answering questions but very hard of hearing, MAEW  LABS:  Lab 10/29/12 0504 10/28/12 0153 10/27/12 2237 10/27/12 2013 10/27/12  2005  HGB 12.9* 11.7* 12.2* -- --  WBC 16.6* 8.6 10.2 -- --  PLT 215 173 193 -- --  NA 139 137 -- 139 --  K 4.7 3.3* -- -- --  CL 105 99 -- 100 --  CO2 22 19 -- 25 --  GLUCOSE 145* 284* -- 130* --  BUN 34* 24* -- 22 --  CREATININE 1.60* 1.58* -- 1.50* --  CALCIUM 9.0 8.6 -- 9.3 --  MG -- -- -- -- --  PHOS -- -- -- -- --  AST -- -- -- 22 --  ALT -- -- -- 12 --  ALKPHOS -- -- -- 96 --  BILITOT -- -- -- 0.4 --  PROT -- -- -- 6.4 --  ALBUMIN -- -- -- 3.5 --  APTT -- -- -- -- --  INR -- -- -- -- --  LATICACIDVEN -- -- -- -- --  TROPONINI -- -- -- <0.30 --  PROCALCITON -- -- -- -- --  PROBNP -- 250.0 -- -- --  O2SATVEN -- -- -- -- --  PHART -- -- -- -- 7.439  PCO2ART -- -- -- -- 34.8*  PO2ART -- -- -- -- 62.0*    Lab 10/29/12 0620 10/28/12 2050 10/28/12 1719 10/28/12 1109 10/28/12 0618  GLUCAP 146* 111* 183* 194* 143*    2/5 CXR: mild  cardiomegally, lungs clear, pacemaker noted  2/3 EKG: Sinus tach, LAD, no st wave changes  ASSESSMENT / PLAN:  PULMONARY A:  1) AE COPD>> based on increased wheezing and cough; heavy prior smoking history  P:   -change to pred 2/5 >> pt w hx delirium on high dose steroids; -xopenex & atrovent -hold spiriva -O2 as needed -levaquin -needs outpatient PFT's -change benazepril to an ARB on 2/5 given large component of UA symptoms.   CARDIOVASCULAR A:  1) Ischemic cardiomyopathy- Diastolic dysfunction based on 1610 Echo 2) CAD 3) Tachycardia - presumably due to many rounds of albuterol at home and in ED  P:  -hold albuterol > xopenex -continue home ASA, B-blocker, statin, ACE-in -tele -gentle IVF, monitor fluid status closely  RENAL A:  CKD, baseline Cr 1.3, currently 1.6 on 2/5  P:  -gentle IVF, consider reduce in am 2/5 -hold home HCTZ for now -repeat BMET daily  GASTROINTESTINAL A:  Chronic constipation  P:   -miralax  HEMATOLOGIC A:  No acute issues  P:  -monitor for bleeding  INFECTIOUS A:  AE COPD  P:    -levaquin  ENDOCRINE A:  DM2 exacerbated by hyperglycemia  P:   -SSI and CBG  NEUROLOGIC A:  Dementia R shoulder and UE pain - not clear whether this is MSK pain vs neuropathic. R shoulder films with tendonitis and OA, no fx's  P:   -decrease steroids 2/5 -frequent orientation -continue home aricept -tendonitis and OA, on prednisone; consider CT scan c-spine or some other eval depending on response to ant-iinflammatories  TODAY'S SUMMARY:  79 y/o male with COPD who presents with an AE COPD.  Continued wheeze afternoon of 2/4.  Seems some component of upper airway in addition to lower wheeze.     Levy Pupa, MD, PhD 10/29/2012, 11:41 AM [facility] [phone] or if no answer [phone]

## 2012-10-29 NOTE — Progress Notes (Signed)
Patient's wife went home. Patient is more relax now. Updated Charge nurse that the wife went home. Will monitor patient more frequently.

## 2012-10-29 NOTE — Progress Notes (Signed)
UR Chart Review Completed  

## 2012-10-29 NOTE — Progress Notes (Signed)
Peripheral IV was successfully inserted by IV team into the right inner forearm. Patient was cooperative with IV team nurse. Will continue to monitor to end of shift.

## 2012-10-30 LAB — GLUCOSE, CAPILLARY
Glucose-Capillary: 98 mg/dL (ref 70–99)
Glucose-Capillary: 148 mg/dL — ABNORMAL HIGH (ref 70–99)
Glucose-Capillary: 185 mg/dL — ABNORMAL HIGH (ref 70–99)

## 2012-10-30 MED ORDER — QUETIAPINE FUMARATE 25 MG PO TABS
25.0000 mg | ORAL_TABLET | Freq: Every day | ORAL | Status: DC
  Administered 2012-10-30 – 2012-11-03 (×5): 25 mg via ORAL
  Filled 2012-10-30 (×7): qty 1

## 2012-10-30 NOTE — Progress Notes (Signed)
Late Entry 10/30/2012: Just FYI, femoral catheter that patient pulled out was seen by IV team nurse and IV team nurse stated to attending nurse that IV catheter tip was still intact and site of where PICC was pulled out is clean, no bruising, or bleeding noted.

## 2012-10-30 NOTE — Progress Notes (Signed)
PULMONARY  / CRITICAL CARE MEDICINE  Name: [name] MRN: [id number] DOB: [birth year]    ADMISSION DATE:  10/27/2012 CONSULTATION DATE:  10/28/2012  REFERRING MD :  Ranae Palms  CHIEF COMPLAINT:  Shortness of breath  BRIEF PATIENT DESCRIPTION: 79 y/o male with COPD was admitted on 2/3 from [facility] for increased shortness of breath presumably due to an AE of COPD.    SIGNIFICANT EVENTS / STUDIES:  2/4 - called by RN for significant wheezing, no distress 2/4 > R shoulder films > calcific tendonitis of rotator cuff, progressive OA of the AC joint, no fractures  LINES / TUBES: 2/3 L Femoral CVL >>  CULTURES: 2/4 MRS PCR>>>neg  ANTIBIOTICS: 2/4 Levaquin (AE COPD) >>   SUBJECTIVE: More trouble w sundowning last night >> haldol given  VITAL SIGNS: Temp:  [97.9 F (36.6 C)-98.2 F (36.8 C)] 98 F (36.7 C) (02/06 0549) Pulse Rate:  [76-86] 79  (02/06 0549) Resp:  [16-19] 18  (02/06 0549) BP: (109-128)/(62-73) 117/62 mmHg (02/06 0549) SpO2:  [97 %-99 %] 97 % (02/06 0807) Weight:  [90.2 kg (198 lb 13.7 oz)] 90.2 kg (198 lb 13.7 oz) (02/06 0549)  VENTILATOR SETTINGS:    INTAKE / OUTPUT: Intake/Output      02/05 0701 - 02/06 0700 02/06 0701 - 02/07 0700   P.O. 480    I.V. (mL/kg)     Total Intake(mL/kg) 480 (5.3)    Urine (mL/kg/hr) 550 (0.3)    Total Output 550    Net -70         Urine Occurrence 1 x    Stool Occurrence 1 x      PHYSICAL EXAMINATION: Gen: chronically ill appearing, no acute distress HEENT: NCAT, PERRL, EOMi, OP clear, neck supple without masses, decreased hearing PULM: UA noise, decreased BS CV: tachy, regular, no mgr, no JVD AB: BS+, soft, nontender, no hsm Ext: warm, trace edema, no clubbing, no cyanosis Derm: no rash or skin breakdown Neuro: Awake and alert, following commands and answering questions but very hard of hearing, MAEW  LABS:  Lab 10/29/12 0504 10/28/12 0153 10/27/12 2237 10/27/12 2013 10/27/12 2005  HGB 12.9* 11.7* 12.2* -- --   WBC 16.6* 8.6 10.2 -- --  PLT 215 173 193 -- --  NA 139 137 -- 139 --  K 4.7 3.3* -- -- --  CL 105 99 -- 100 --  CO2 22 19 -- 25 --  GLUCOSE 145* 284* -- 130* --  BUN 34* 24* -- 22 --  CREATININE 1.60* 1.58* -- 1.50* --  CALCIUM 9.0 8.6 -- 9.3 --  MG -- -- -- -- --  PHOS -- -- -- -- --  AST -- -- -- 22 --  ALT -- -- -- 12 --  ALKPHOS -- -- -- 96 --  BILITOT -- -- -- 0.4 --  PROT -- -- -- 6.4 --  ALBUMIN -- -- -- 3.5 --  APTT -- -- -- -- --  INR -- -- -- -- --  LATICACIDVEN -- -- -- -- --  TROPONINI -- -- -- <0.30 --  PROCALCITON -- -- -- -- --  PROBNP -- 250.0 -- -- --  O2SATVEN -- -- -- -- --  PHART -- -- -- -- 7.439  PCO2ART -- -- -- -- 34.8*  PO2ART -- -- -- -- 62.0*    Lab 10/30/12 0611 10/29/12 1644 10/29/12 1137 10/29/12 0620 10/28/12 2050  GLUCAP 98 133* 140* 146* 111*    2/5 CXR: mild cardiomegally, lungs clear,  pacemaker noted  2/3 EKG: Sinus tach, LAD, no st wave changes  ASSESSMENT / PLAN:  PULMONARY A:  1) AE COPD>> based on increased wheezing and cough; heavy prior smoking history  P:   -changed to pred 2/5 >> pt w hx delirium on high dose steroids; Pred 30 -xopenex & atrovent -hold spiriva -O2 as needed -levaquin -needs outpatient PFT's -changed benazepril to an ARB on 2/5 given large component of UA symptoms.   CARDIOVASCULAR A:  1) Ischemic cardiomyopathy- Diastolic dysfunction based on 2725 Echo 2) CAD 3) Tachycardia - presumably due to many rounds of albuterol at home and in ED  P:  -hold albuterol > xopenex -continue home ASA, B-blocker, statin, ACE-in -tele -gentle IVF, monitor fluid status closely  RENAL A:  CKD, baseline Cr 1.3, currently 1.6 on 2/5, 2/6 P:  -hold home HCTZ for now, restart 2/7 if S Cr stable -repeat BMET daily  GASTROINTESTINAL A:  Chronic constipation  P:   -miralax  HEMATOLOGIC A:  No acute issues  P:  -monitor for bleeding  INFECTIOUS A:  AE COPD  P:   -levaquin  ENDOCRINE A:  DM2  exacerbated by hyperglycemia  P:   -SSI and CBG  NEUROLOGIC A:  Dementia with nocturnal delirium R shoulder and UE pain - not clear whether this is MSK pain vs neuropathic. R shoulder films with tendonitis and OA, no fx's  P:   -decreased steroids 2/5 -frequent orientation -continue home aricept -trazadone -add low dose seroquel qhs 2/6 -R shoulder tendonitis and OA, on prednisone; consider CT scan c-spine or some other eval depending on response to anti-inflammatories  TODAY'S SUMMARY:  79 y/o male with COPD who presents with an AE COPD.  Continued wheeze.  Seems some component of upper airway in addition to lower wheeze.     Levy Pupa, MD, PhD 10/30/2012, 8:24 AM [facility] [phone] or if no answer [phone]

## 2012-10-30 NOTE — Clinical Documentation Improvement (Signed)
CHANGE MENTAL STATUS DOCUMENTATION CLARIFICATION   THIS DOCUMENT IS NOT A PERMANENT PART OF THE MEDICAL RECORD          10/31/12  Canary Brim NP and/or Associates,  In an effort to better capture your patient's severity of illness, reflect appropriate length of stay and utilization of resources, a review of the patient medical record has revealed the following indicators:  Known history of Dementia, on Aricept prior to admission and during admission  "solumedrol 60mg  daily, taper quickly given history of sundowning with steroids in past" MCQUAID, DOUGLAS,MD  H&P 10/28/2012, 1:14 AM  "Patient very combative and agitated. He is aggressively trying to get out of bed. Nurse Animator at bedside provided assistance. However, patient is still agitated. Paged MD and ordered for Haldol now dose and a sitter. Called wife as well to update about the incident. Will monitor patient frequently."  Nurses Note 10/28/12  "Pt acutely delirious. Hx of OBS" "Give haldol times one dose 5mg  IVP"   Shan Levans  10/28/2012, 8:48 PM E-link Progress Note  "SUBJECTIVE:  More trouble w sundowning last night >> haldol given" Levy Pupa, MD, PhD  10/30/2012, 8:24 AM  Progress Note  "Sundowning improved with steroid taper Dementia with nocturnal delirium" Canary Brim NP 10/31/12 Progress Note   Based on your clinical judgment, please document in the progress notes and discharge summary if a condition below provides greater specificity regarding the patient's delirium/ sundowning:    - Acute Encephalopathy 2/2 _________________   - Acute Delirium secondary to corticosteroid usage for AECOPD.    - Acute Exacerbation of known Dementia, including TYPE of dementia   - Other Condition   - Cannot Clinically Determine    In responding to this query please exercise your independent judgment.    The fact that a query is asked, does not imply that any particular answer is desired or  expected.   Reviewed: additional documentation in the medical record   Thank You,  Jerral Ralph  RN BSN CCDS Certified Clinical Documentation Specialist: Cell   [phone]  Health Information Management [place]   TO RESPOND TO THE THIS QUERY, FOLLOW THE INSTRUCTIONS BELOW:  1. If needed, update documentation for the patient's encounter via the notes activity.  2. Access this query again and click edit on the In [employer].  3. After updating, or not, click F2 to complete all highlighted (required) fields concerning your review. Select "additional documentation in the medical record" OR "no additional documentation provided".  4. Click Sign note button.  5. The deficiency will fall out of your In Basket *Please let us know if you are not able to complete this workflow by phone or e-mail (listed below).

## 2012-10-31 LAB — GLUCOSE, CAPILLARY
Glucose-Capillary: 182 mg/dL — ABNORMAL HIGH (ref 70–99)
Glucose-Capillary: 197 mg/dL — ABNORMAL HIGH (ref 70–99)
Glucose-Capillary: 160 mg/dL — ABNORMAL HIGH (ref 70–99)

## 2012-10-31 LAB — BASIC METABOLIC PANEL
CO2: 26 mEq/L (ref 19–32)
Calcium: 9.1 mg/dL (ref 8.4–10.5)
GFR calc non Af Amer: 42 mL/min — ABNORMAL LOW (ref >90)
Sodium: 138 mEq/L (ref 135–145)

## 2012-10-31 MED ORDER — HYDROCHLOROTHIAZIDE 12.5 MG PO CAPS
12.5000 mg | ORAL_CAPSULE | Freq: Every day | ORAL | Status: DC
  Administered 2012-10-31 – 2012-11-04 (×5): 12.5 mg via ORAL
  Filled 2012-10-31 (×6): qty 1

## 2012-10-31 NOTE — Progress Notes (Addendum)
PULMONARY  / CRITICAL CARE MEDICINE  Name: [name] MRN: [id number] DOB: 1927-03-18    ADMISSION DATE:  10/27/2012 CONSULTATION DATE:  10/28/2012  REFERRING MD :  Ranae Palms  CHIEF COMPLAINT:  Shortness of breath  BRIEF PATIENT DESCRIPTION: 79 y/o male with COPD was admitted on 2/3 from [facility] for increased shortness of breath presumably due to an AE of COPD.    SIGNIFICANT EVENTS / STUDIES:  2/4 - called by RN for significant wheezing, no distress 2/4 > R shoulder films > calcific tendonitis of rotator cuff, progressive OA of the AC joint, no fractures  LINES / TUBES: 2/3 L Femoral CVL >>  CULTURES: 2/4 MRS PCR>>>neg  ANTIBIOTICS: 2/4 Levaquin (AE COPD) >>   SUBJECTIVE: RN reports no acute events.  Sundowning improved with steroid taper.  Continues to have some shoulder discomfort.    VITAL SIGNS: Temp:  [97.2 F (36.2 C)-98 F (36.7 C)] 98 F (36.7 C) (02/07 1130) Pulse Rate:  [70-83] 82  (02/07 1130) Resp:  [18-20] 20  (02/07 1130) BP: (94-137)/(66-72) 137/72 mmHg (02/07 1130) SpO2:  [95 %-98 %] 95 % (02/07 1130) Weight:  [198 lb 10.2 oz (90.1 kg)] 198 lb 10.2 oz (90.1 kg) (02/07 0612)  INTAKE / OUTPUT: Intake/Output      02/06 0701 - 02/07 0700 02/07 0701 - 02/08 0700   P.O. 240 480   Total Intake(mL/kg) 240 (2.7) 480 (5.3)   Urine (mL/kg/hr) 600 (0.3) 450 (0.9)   Total Output 600 450   Net -360 +30        Stool Occurrence  1 x     PHYSICAL EXAMINATION: Gen: chronically ill appearing, no acute distress HEENT: NCAT, PERRL, EOMi, OP clear, neck supple without masses, decreased hearing PULM: UA noise, decreased BS, few scattered wheezes CV: tachy, regular, no mgr, no JVD AB: BS+, soft, nontender, no hsm Ext: warm, trace edema, no clubbing, no cyanosis Derm: no rash or skin breakdown Neuro: Awake and alert, following commands and answering questions but very hard of hearing, MAEW  LABS:  Lab 10/31/12 0525 10/29/12 0504 10/28/12 0153 10/27/12 2237  10/27/12 2013 10/27/12 2005  HGB -- 12.9* 11.7* 12.2* -- --  WBC -- 16.6* 8.6 10.2 -- --  PLT -- 215 173 193 -- --  NA 138 139 137 -- -- --  K 4.6 4.7 -- -- -- --  CL 102 105 99 -- -- --  CO2 26 22 19  -- -- --  GLUCOSE 129* 145* 284* -- -- --  BUN 28* 34* 24* -- -- --  CREATININE 1.47* 1.60* 1.58* -- -- --  CALCIUM 9.1 9.0 8.6 -- -- --  MG -- -- -- -- -- --  PHOS -- -- -- -- -- --  AST -- -- -- -- 22 --  ALT -- -- -- -- 12 --  ALKPHOS -- -- -- -- 96 --  BILITOT -- -- -- -- 0.4 --  PROT -- -- -- -- 6.4 --  ALBUMIN -- -- -- -- 3.5 --  APTT -- -- -- -- -- --  INR -- -- -- -- -- --  LATICACIDVEN -- -- -- -- -- --  TROPONINI -- -- -- -- <0.30 --  PROCALCITON -- -- -- -- -- --  PROBNP -- -- 250.0 -- -- --  O2SATVEN -- -- -- -- -- --  PHART -- -- -- -- -- 7.439  PCO2ART -- -- -- -- -- 34.8*  PO2ART -- -- -- -- -- 62.0*  Lab 10/31/12 1052 10/31/12 0609 10/30/12 2136 10/30/12 1811 10/30/12 1627  GLUCAP 182* 134* 133* 185* 189*    2/5 CXR: mild cardiomegally, lungs clear, pacemaker noted  2/3 EKG: Sinus tach, LAD, no st wave changes  ASSESSMENT / PLAN:  PULMONARY A:  1) AE COPD>> based on increased wheezing and cough; heavy prior smoking history  P:   -changed to pred 2/5 >> pt w hx delirium on high dose steroids; Pred 30 -xopenex & atrovent -hold spiriva -O2 as needed -levaquin -needs outpatient PFT's -changed benazepril to an ARB on 2/5 given large component of UA symptoms.  -ambulatory desaturaton  CARDIOVASCULAR A:  1) Ischemic cardiomyopathy- Diastolic dysfunction based on 1610 Echo 2) CAD 3) Tachycardia - presumably due to many rounds of albuterol at home and in ED  P:  -hold albuterol > xopenex -continue home ASA, B-blocker, statin, changed to ARB -tele -gentle IVF, monitor fluid status closely  RENAL A:  CKD, baseline Cr 1.3, currently 1.6 on 2/5, 2/6 P:  -HCTZ held on admit due to increased sr cr, restarted 2/7  -repeat BMET  daily  GASTROINTESTINAL A:  Chronic constipation  P:   -miralax  HEMATOLOGIC A:  No acute issues  P:  -monitor for bleeding  INFECTIOUS A:  AE COPD  P:   -levaquin  ENDOCRINE A:  DM2 exacerbated by hyperglycemia  P:   -SSI and CBG  NEUROLOGIC A:   Dementia with nocturnal delirium Acute Delerium in setting of corticosteroid usage for AECOPD.   R shoulder and UE pain - not clear whether this is MSK pain vs neuropathic. R shoulder films with tendonitis and OA, no fx's  P:   -decreased steroids 2/5 -frequent orientation -continue home aricept -trazadone -added low dose seroquel qhs 2/6 -R shoulder tendonitis and OA, on prednisone; consider CT scan c-spine or some other eval depending on response to anti-inflammatories  TODAY'S SUMMARY:  79 y/o male with COPD who presents with an AE COPD.  Continued wheeze.  Seems some component of upper airway in addition to lower wheeze.  Resume HCTZ and monitor sr cr.  Will need to assess for home O2 needs.     Canary Brim, NP-C [facility] Pgr: [phone] or [phone]   Levy Pupa, MD, PhD 10/31/2012, 3:28 PM [facility] [phone] or if no answer [phone]

## 2012-10-31 NOTE — Progress Notes (Signed)
Patient has been calm and has had not had any agitation during this day shift and the day shift from yesterday 10/30/2012. Patient has been appropriated while sitter is in with patient to keep patient safe. Patient currently complains of no pain or discomfort but will continue to monitor patient as needed to the end of the shift.

## 2012-10-31 NOTE — Progress Notes (Signed)
ANTIBIOTIC CONSULT NOTE - FOLLOW UP  Pharmacy Consult for Levaquin Indication: COPD exacerbation  Allergies  Allergen Reactions  . Doxycycline     Unknown- patient doesn't remember  . Statins     Takes simvastatin at home  . Penicillins Rash    Patient Measurements: Height: 5\' 6"  (167.6 cm) Weight: 198 lb 10.2 oz (90.1 kg) IBW/kg (Calculated) : 63.8  Vital Signs: Temp: 98 F (36.7 C) (02/07 1421) Temp src: Oral (02/07 1421) BP: 120/58 mmHg (02/07 1421) Pulse Rate: 87  (02/07 1421) Intake/Output from previous day: 02/06 0701 - 02/07 0700 In: 240 [P.O.:240] Out: 600 [Urine:600] Intake/Output from this shift: Total I/O In: 480 [P.O.:480] Out: 450 [Urine:450]  Labs:  [facility] 10/31/12 0525 10/29/12 0504  WBC -- 16.6*  HGB -- 12.9*  PLT -- 215  LABCREA -- --  CREATININE 1.47* 1.60*   Estimated Creatinine Clearance: 40 ml/min (by C-G formula based on Cr of 1.47).  Microbiology: Recent Results (from the past 720 hour(s))  MRSA PCR SCREENING     Status: Normal   Collection Time   10/28/12 12:42 AM      Component Value Range Status Comment   MRSA by PCR NEGATIVE  NEGATIVE Final     Anti-infectives     Start     Dose/Rate Route Frequency Ordered Stop   10/29/12 2200   levofloxacin (LEVAQUIN) IVPB 750 mg  Status:  Discontinued        750 mg 100 mL/hr over 90 Minutes Intravenous Every 48 hours 10/28/12 0132 10/29/12 1150   10/29/12 2200   levofloxacin (LEVAQUIN) tablet 750 mg        750 mg Oral Every 48 hours 10/29/12 1157 11/02/12 2159   10/29/12 1200   levofloxacin (LEVAQUIN) tablet 500 mg  Status:  Discontinued        500 mg Oral Daily 10/29/12 1150 10/29/12 1157   10/27/12 2245   levofloxacin (LEVAQUIN) IVPB 750 mg        750 mg 100 mL/hr over 90 Minutes Intravenous  Once 10/27/12 2234 10/27/12 2341         Assessment: 85 YOM with acute COPD exacerbation on Levaquin d#5/5. WBC from 2/5 was up at 16.6 but no repeat available. No culture data. Patient  remains afebrile. CrCl is stable at 40 mL/min.    Plan:  No change to Levaquin dose. Pharmacy will sign off.   Link Snuffer, PharmD, BCPS Clinical Pharmacist [phone] 10/31/2012,2:51 PM

## 2012-10-31 NOTE — Progress Notes (Signed)
SATURATION QUALIFICATIONS: (This note is used to comply with regulatory documentation for home oxygen)  Patient Saturations on Room Air at Rest = 93%  Patient Saturations on Room Air while Ambulating = 91%  Patient Saturations on  Liters of oxygen while Ambulating = %  Please briefly explain why patient needs home oxygen: 

## 2012-11-01 DIAGNOSIS — E785 Hyperlipidemia, unspecified: Secondary | ICD-10-CM

## 2012-11-01 DIAGNOSIS — I2510 Atherosclerotic heart disease of native coronary artery without angina pectoris: Secondary | ICD-10-CM

## 2012-11-01 DIAGNOSIS — F0391 Unspecified dementia with behavioral disturbance: Secondary | ICD-10-CM | POA: Diagnosis present

## 2012-11-01 DIAGNOSIS — N182 Chronic kidney disease, stage 2 (mild): Secondary | ICD-10-CM | POA: Diagnosis not present

## 2012-11-01 LAB — CBC
Hemoglobin: 12 g/dL — ABNORMAL LOW (ref 13.0–17.0)
RBC: 3.9 MIL/uL — ABNORMAL LOW (ref 4.22–5.81)

## 2012-11-01 LAB — BASIC METABOLIC PANEL
Chloride: 103 mEq/L (ref 96–112)
GFR calc Af Amer: 51 mL/min — ABNORMAL LOW (ref >90)
Potassium: 4.1 mEq/L (ref 3.5–5.1)
Sodium: 140 mEq/L (ref 135–145)

## 2012-11-01 LAB — GLUCOSE, CAPILLARY
Glucose-Capillary: 110 mg/dL — ABNORMAL HIGH (ref 70–99)
Glucose-Capillary: 215 mg/dL — ABNORMAL HIGH (ref 70–99)

## 2012-11-01 LAB — MAGNESIUM: Magnesium: 2 mg/dL (ref 1.5–2.5)

## 2012-11-01 MED ORDER — GUAIFENESIN ER 600 MG PO TB12
1200.0000 mg | ORAL_TABLET | Freq: Two times a day (BID) | ORAL | Status: DC
  Administered 2012-11-01 – 2012-11-04 (×7): 1200 mg via ORAL
  Filled 2012-11-01 (×8): qty 2

## 2012-11-01 NOTE — Progress Notes (Signed)
PULMONARY  / CRITICAL CARE MEDICINE  Name: [name] MRN: [id number] DOB: 06/13/1927    ADMISSION DATE:  10/27/2012 CONSULTATION DATE:  10/28/2012  REFERRING MD :  Ranae Palms  CHIEF COMPLAINT:  Shortness of breath  BRIEF PATIENT DESCRIPTION: 79 y/o male with COPD was admitted on 2/3 from [facility] for increased shortness of breath presumably due to an AE of COPD.    SIGNIFICANT EVENTS / STUDIES:  2/4 - called by RN for significant wheezing, no distress 2/4 > R shoulder films > calcific tendonitis of rotator cuff, progressive OA of the AC joint, no fractures  LINES / TUBES: 2/3 L Femoral CVL >>  CULTURES: 2/4 MRS PCR>>>neg  ANTIBIOTICS: 2/4 Levaquin (AE COPD) >>   SUBJECTIVE: RN reports no acute events.  Sundowning improved with steroid taper.  Continues to have some shoulder discomfort.    VITAL SIGNS: Temp:  [97 F (36.1 C)-98 F (36.7 C)] 97.5 F (36.4 C) (02/08 0505) Pulse Rate:  [78-94] 78 (02/08 0505) Resp:  [20-21] 21 (02/08 0505) BP: (120-152)/(58-76) 152/76 mmHg (02/08 0505) SpO2:  [92 %-97 %] 95 % (02/08 0505) Weight:  [89.721 kg (197 lb 12.8 oz)] 89.721 kg (197 lb 12.8 oz) (02/08 0505)  INTAKE / OUTPUT: Intake/Output     02/07 0701 - 02/08 0700 02/08 0701 - 02/09 0700   P.O. 480 120   Total Intake(mL/kg) 480 (5.3) 120 (1.3)   Urine (mL/kg/hr) 1100 (0.5) 225 (1.5)   Total Output 1100 225   Net -620 -105        Stool Occurrence 1 x      PHYSICAL EXAMINATION: Gen: chronically ill appearing, no acute distress HEENT: NCAT, PERRL, EOMi, OP clear, neck supple without masses, decreased hearing PULM: UA noise, decreased BS, few scattered wheezes CV: tachy, regular, no mgr, no JVD AB: BS+, soft, nontender, no hsm Ext: warm, trace edema, no clubbing, no cyanosis Derm: no rash or skin breakdown Neuro: Awake and alert, following commands and answering questions but very hard of hearing, MAEW  LABS:  Recent Labs Lab 10/27/12 2005  10/27/12 2013   10/28/12 0153 10/29/12 0504 10/31/12 0525 11/01/12 0600  HGB  --   --   --   < > 11.7* 12.9*  --  12.0*  WBC  --   --   --   < > 8.6 16.6*  --  8.0  PLT  --   --   --   < > 173 215  --  203  NA  --   < > 139  --  137 139 138 140  K  --   < > 3.8  --  3.3* 4.7 4.6 4.1  CL  --   < > 100  --  99 105 102 103  CO2  --   < > 25  --  19 22 26 29   GLUCOSE  --   < > 130*  --  284* 145* 129* 115*  BUN  --   < > 22  --  24* 34* 28* 32*  CREATININE  --   < > 1.50*  --  1.58* 1.60* 1.47* 1.43*  CALCIUM  --   < > 9.3  --  8.6 9.0 9.1 9.1  MG  --   --   --   --   --   --   --  2.0  AST  --   --  22  --   --   --   --   --  ALT  --   --  12  --   --   --   --   --   ALKPHOS  --   --  96  --   --   --   --   --   BILITOT  --   --  0.4  --   --   --   --   --   PROT  --   --  6.4  --   --   --   --   --   ALBUMIN  --   --  3.5  --   --   --   --   --   TROPONINI  --   --  <0.30  --   --   --   --   --   PROBNP  --   --   --   --  250.0  --   --   --   PHART 7.439  --   --   --   --   --   --   --   PCO2ART 34.8*  --   --   --   --   --   --   --   PO2ART 62.0*  --   --   --   --   --   --   --   < > = values in this interval not displayed.  Recent Labs Lab 10/31/12 0609 10/31/12 1052 10/31/12 1752 10/31/12 2142 11/01/12 0549  GLUCAP 134* 182* 197* 160* 110*   2/5 CXR: mild cardiomegally, lungs clear, pacemaker noted  2/3 EKG: Sinus tach, LAD, no st wave changes Note> 2DEcho [id number] w/ EF=60%, Gr1DD, mild AS   ASSESSMENT / PLAN:  PULMONARY A:  1) AE COPD>> based on increased wheezing and cough; heavy prior smoking history  P:   -changed to pred 2/5 >> pt w hx delirium on high dose steroids; Pred 30mg /d -xopenex & atrovent -hold spiriva -O2 as needed -levaquin -needs outpatient PFT's -changed benazepril to an ARB on 2/5 given large component of UA symptoms.  -ambulatory desaturaton -2/8 on NEBS w/ Xopenex/Atrovent Qid, Levaquin 500mg /d, Pred 30mg /d, & ch to Mucinex 1200Bid;  add flutter Rx...  CARDIOVASCULAR A:  1) Ischemic cardiomyopathy- Diastolic dysfunction based on 4782 Echo 2) CAD 3) Tachycardia - presumably due to many rounds of albuterol at home and in ED  P:  -hold albuterol > xopenex -continue home ASA, B-blocker, statin, changed to ARB -tele -gentle IVF, monitor fluid status closely  RENAL A:  CKD, baseline Cr 1.3, currently 1.6 on 2/5, 2/6 P:  -HCTZ held on admit due to increased sr cr, restarted 2/7  -repeat BMET daily  GASTROINTESTINAL A:  Chronic constipation  P:   -miralax  HEMATOLOGIC A:  No acute issues  P:  -monitor for bleeding  INFECTIOUS A:  AE COPD  P:   -levaquin  ENDOCRINE A:  DM2 exacerbated by hyperglycemia  P:   -SSI and CBG  NEUROLOGIC A:   1) Dementia with nocturnal delirium 2 )Acute Delirium in setting of corticosteroid usage for AECOPD.   3) Rt shoulder and UE pain - not clear whether this is MSK pain vs neuropathic. R shoulder films with tendonitis and OA, no fx's P:   -decreased steroids 2/5 (30mg /d) -frequent orientation -continue home aricept 5mg  -trazadone -added low dose seroquel qhs 2/6 -R shoulder tendonitis and OA, on prednisone; consider CT scan c-spine or some other eval  depending on response to anti-inflammatories  TODAY'S SUMMARY:  79 y/o male with COPD who presents with an AE COPD.  Continued wheeze.  Seems some component of upper airway in addition to lower wheeze.  Resume HCTZ and monitor sr cr.   2/7- Assessed for home O2 needs=> 93% RA at rest, 91% RA w/ ambulation    Todd M. Kriste Basque, MD 11/01/2012, 8:41 AM [facility] [phone] or if no answer [phone]

## 2012-11-02 ENCOUNTER — Encounter (HOSPITAL_COMMUNITY): Payer: Self-pay | Admitting: Pulmonary Disease

## 2012-11-02 DIAGNOSIS — N182 Chronic kidney disease, stage 2 (mild): Secondary | ICD-10-CM

## 2012-11-02 DIAGNOSIS — F0391 Unspecified dementia with behavioral disturbance: Secondary | ICD-10-CM

## 2012-11-02 LAB — GLUCOSE, CAPILLARY

## 2012-11-02 NOTE — Progress Notes (Signed)
PULMONARY  / CRITICAL CARE MEDICINE  Name: [name] MRN: [id number] DOB: 03-May-1927    ADMISSION DATE:  10/27/2012 CONSULTATION DATE:  10/28/2012  REFERRING MD :  Ranae Palms  CHIEF COMPLAINT:  Shortness of breath  BRIEF PATIENT DESCRIPTION: 79 y/o male with COPD was admitted on 2/3 from [facility] for increased shortness of breath presumably due to an AE of COPD.    SIGNIFICANT EVENTS / STUDIES:  2/4 - called by RN for significant wheezing, no distress 2/4 > R shoulder films > calcific tendonitis of rotator cuff, progressive OA of the AC joint, no fractures  LINES / TUBES: 2/3 L Femoral CVL >>  CULTURES: 2/4 MRS PCR>>>neg  ANTIBIOTICS: 2/4 Levaquin (AE COPD) >>   SUBJECTIVE: RN reports no acute events.  Sundowning improved with steroid taper.  Continues to have some shoulder discomfort.    VITAL SIGNS: Temp:  [96.6 F (35.9 C)-98.2 F (36.8 C)] 97 F (36.1 C) (02/09 0509) Pulse Rate:  [73-90] 73 (02/09 0509) Resp:  [18-20] 20 (02/09 0509) BP: (127-156)/(72-85) 130/84 mmHg (02/09 0509) SpO2:  [92 %-95 %] 95 % (02/09 0723) Weight:  [89.086 kg (196 lb 6.4 oz)] 89.086 kg (196 lb 6.4 oz) (02/09 0509)  INTAKE / OUTPUT: Intake/Output     02/08 0701 - 02/09 0700 02/09 0701 - 02/10 0700   P.O. 580    Total Intake(mL/kg) 580 (6.5)    Urine (mL/kg/hr) 1975 (0.9)    Total Output 1975     Net -1395          Urine Occurrence 1 x    Stool Occurrence 2 x      PHYSICAL EXAMINATION: Gen: chronically ill appearing, no acute distress HEENT: NCAT, PERRL, EOMi, OP clear, neck supple without masses, decreased hearing PULM: UA noise, decreased BS, few scattered wheezes CV: tachy, regular, no mgr, no JVD AB: BS+, soft, nontender, no hsm Ext: warm, trace edema, no clubbing, no cyanosis Derm: no rash or skin breakdown Neuro: Awake and alert, following commands and answering questions but very hard of hearing, MAEW  LABS:  Recent Labs Lab 10/27/12 2005  10/27/12 2013   10/28/12 0153 10/29/12 0504 10/31/12 0525 11/01/12 0600  HGB  --   --   --   < > 11.7* 12.9*  --  12.0*  WBC  --   --   --   < > 8.6 16.6*  --  8.0  PLT  --   --   --   < > 173 215  --  203  NA  --   < > 139  --  137 139 138 140  K  --   < > 3.8  --  3.3* 4.7 4.6 4.1  CL  --   < > 100  --  99 105 102 103  CO2  --   < > 25  --  19 22 26 29   GLUCOSE  --   < > 130*  --  284* 145* 129* 115*  BUN  --   < > 22  --  24* 34* 28* 32*  CREATININE  --   < > 1.50*  --  1.58* 1.60* 1.47* 1.43*  CALCIUM  --   < > 9.3  --  8.6 9.0 9.1 9.1  MG  --   --   --   --   --   --   --  2.0  AST  --   --  22  --   --   --   --   --  ALT  --   --  12  --   --   --   --   --   ALKPHOS  --   --  96  --   --   --   --   --   BILITOT  --   --  0.4  --   --   --   --   --   PROT  --   --  6.4  --   --   --   --   --   ALBUMIN  --   --  3.5  --   --   --   --   --   TROPONINI  --   --  <0.30  --   --   --   --   --   PROBNP  --   --   --   --  250.0  --   --   --   PHART 7.439  --   --   --   --   --   --   --   PCO2ART 34.8*  --   --   --   --   --   --   --   PO2ART 62.0*  --   --   --   --   --   --   --   < > = values in this interval not displayed.  Recent Labs Lab 11/01/12 0549 11/01/12 1131 11/01/12 1628 11/01/12 2106 11/02/12 0600  GLUCAP 110* 299* 215* 127* 122*   2/5 CXR: mild cardiomegally, lungs clear, pacemaker noted  2/3 EKG: Sinus tach, LAD, no st wave changes Note> 2DEcho [id number] w/ EF=60%, Gr1DD, mild AS   ASSESSMENT / PLAN:  PULMONARY A:  1) AE COPD>> based on increased wheezing and cough; heavy prior smoking history  P:   -changed to pred 2/5 >> pt w hx delirium on high dose steroids; Pred 30mg /d -xopenex & atrovent -hold spiriva -O2 as needed -levaquin -needs outpatient PFT's -changed benazepril to an ARB on 2/5 given large component of UA symptoms.  -ambulatory desaturaton -2/8 on NEBS w/ Xopenex/Atrovent Qid, Levaquin 500mg /d, Pred 30mg /d, & ch to Mucinex 1200Bid;  add flutter Rx... -2/9 resting better, less congested, continue Rx & slowly wean Pred (decr to 20mg /d)...  CARDIOVASCULAR A:  1) Ischemic cardiomyopathy- Diastolic dysfunction based on 1308 Echo 2) CAD 3) Tachycardia - presumably due to many rounds of albuterol at home and in ED  P:  -hold albuterol > xopenex -continue home ASA, B-blocker, statin, changed to ARB -tele -gentle IVF, monitor fluid status closely  RENAL A:  CKD, baseline Cr 1.3, currently 1.6 on 2/5, 2/6 P:  -HCTZ held on admit due to increased sr cr, restarted 2/7  -repeat BMET daily  GASTROINTESTINAL A:  Chronic constipation P:   -miralax  HEMATOLOGIC A:  No acute issues P:  -monitor for bleeding  INFECTIOUS A:  AE COPD P:   -levaquin  ENDOCRINE A:  DM2 exacerbated by hyperglycemia - BS= 110-299 P:   -SSI and CBG  NEUROLOGIC A:   1) Dementia with nocturnal delirium 2 )Acute Delirium in setting of corticosteroid usage for AECOPD.   3) Rt shoulder and UE pain - not clear whether this is MSK pain vs neuropathic. R shoulder films with tendonitis and OA, no fx's P:   -decreased steroids 2/5 (30mg /d) -frequent orientation -continue home aricept 5mg  -trazadone -added low dose seroquel qhs 2/6 -R shoulder  tendonitis and OA, on prednisone; consider CT scan c-spine or some other eval depending on response to anti-inflammatories   TODAY'S SUMMARY:  79 y/o male with COPD who presents with an AE COPD.  decreased wheeze.  Seems some component of upper airway in addition to lower wheeze.  Resume HCTZ and monitor sr cr.  => f/u labs ordered. 2/7- Assessed for home O2 needs=> 93% RA at rest, 91% RA w/ ambulation    Aiyanah Kalama M. Kriste Basque, MD 11/02/2012, 8:52 AM [facility] [phone] or if no answer [phone]

## 2012-11-03 LAB — COMPREHENSIVE METABOLIC PANEL
ALT: 12 U/L (ref 0–53)
Alkaline Phosphatase: 72 U/L (ref 39–117)
BUN: 28 mg/dL — ABNORMAL HIGH (ref 6–23)
CO2: 25 mEq/L (ref 19–32)
Chloride: 99 mEq/L (ref 96–112)
GFR calc Af Amer: 51 mL/min — ABNORMAL LOW (ref >90)
GFR calc non Af Amer: 44 mL/min — ABNORMAL LOW (ref >90)
Glucose, Bld: 119 mg/dL — ABNORMAL HIGH (ref 70–99)
Potassium: 3.7 mEq/L (ref 3.5–5.1)
Sodium: 137 mEq/L (ref 135–145)
Total Bilirubin: 0.3 mg/dL (ref 0.3–1.2)
Total Protein: 5.6 g/dL — ABNORMAL LOW (ref 6.0–8.3)

## 2012-11-03 LAB — CBC
HCT: 38.7 % — ABNORMAL LOW (ref 39.0–52.0)
MCHC: 33.9 g/dL (ref 30.0–36.0)
Platelets: 267 10*3/uL (ref 150–400)
RDW: 13.6 % (ref 11.5–15.5)

## 2012-11-03 LAB — GLUCOSE, CAPILLARY: Glucose-Capillary: 191 mg/dL — ABNORMAL HIGH (ref 70–99)

## 2012-11-03 MED ORDER — AEROCHAMBER PLUS FLO-VU MEDIUM MISC
1.0000 | Freq: Once | Status: DC
  Filled 2012-11-03: qty 1

## 2012-11-03 MED ORDER — BUDESONIDE-FORMOTEROL FUMARATE 160-4.5 MCG/ACT IN AERO
2.0000 | INHALATION_SPRAY | Freq: Two times a day (BID) | RESPIRATORY_TRACT | Status: DC
  Administered 2012-11-03 – 2012-11-04 (×2): 2 via RESPIRATORY_TRACT
  Filled 2012-11-03 (×2): qty 6

## 2012-11-03 MED ORDER — TIOTROPIUM BROMIDE MONOHYDRATE 18 MCG IN CAPS
18.0000 ug | ORAL_CAPSULE | Freq: Every day | RESPIRATORY_TRACT | Status: DC
  Administered 2012-11-03 – 2012-11-04 (×2): 18 ug via RESPIRATORY_TRACT
  Filled 2012-11-03: qty 5

## 2012-11-03 NOTE — Plan of Care (Signed)
Problem: Phase II Progression Outcomes Goal: Discharge plan remains appropriate-arrangements made Outcome: Completed/Met Date Met:  11/03/12 Wife intends on taking pt home. MD ordered PT eval and will follow up with home health care as needed

## 2012-11-03 NOTE — Plan of Care (Signed)
Problem: Consults Goal: Respiratory Problems Patient Education See Patient Education Module for education specifics.  Outcome: Completed/Met Date Met:  11/03/12 Wife is primary care giver says she is confident in giving inhalers and breathing treatments at home

## 2012-11-03 NOTE — Progress Notes (Signed)
Patient stated that he was SOB this morning and notable wheezes could be heard.  Patient's O2 saturation was 92% on room air.  Gave a prn Xopenex treatment per respiratory's request and placed the patient on 2L of O2 N/C.  Patient's O2 climbed to 96%.  RN will continue to monitor the patient and will report to the oncoming RN.

## 2012-11-03 NOTE — Plan of Care (Signed)
Problem: Phase I Progression Outcomes Goal: Flu/PneumoVaccines if indicated Outcome: Completed/Met Date Met:  11/03/12 Had flu vaccine 06/25/2012

## 2012-11-03 NOTE — Progress Notes (Signed)
PULMONARY  / CRITICAL CARE MEDICINE  Name: [name] MRN: [id number] DOB: 1927-06-26    ADMISSION DATE:  10/27/2012 CONSULTATION DATE:  10/28/2012  REFERRING MD :  Ranae Palms PCP [facility]  CHIEF COMPLAINT:  Shortness of breath  BRIEF PATIENT DESCRIPTION: 79 y/o male with COPD was admitted on 2/3 from [facility] for increased shortness of breath presumably due to an AE of COPD.    SIGNIFICANT EVENTS / STUDIES:  2/4 - called by RN for significant wheezing, no distress 2/4 > R shoulder films > calcific tendonitis of rotator cuff, progressive OA of the AC joint, no fractures  LINES / TUBES: 2/3 L Femoral CVL >>d/c  CULTURES: 2/4 MRS PCR>>>neg  ANTIBIOTICS: 2/4 Levaquin (AE COPD) >>   SUBJECTIVE: RN reports no acute events.  Sundowning improved with steroid taper.  Continues to have some shoulder discomfort.    VITAL SIGNS: Temp:  [98 F (36.7 C)-98.4 F (36.9 C)] 98 F (36.7 C) (02/10 1306) Pulse Rate:  [78-80] 78 (02/10 1306) Resp:  [20] 20 (02/10 1306) BP: (107-124)/(57-79) 116/62 mmHg (02/10 1306) SpO2:  [93 %-95 %] 95 % (02/10 1306) Weight:  [88.2 kg (194 lb 7.1 oz)] 88.2 kg (194 lb 7.1 oz) (02/10 0516)  INTAKE / OUTPUT: Intake/Output     02/09 0701 - 02/10 0700 02/10 0701 - 02/11 0700   P.O. 1327 120   Total Intake(mL/kg) 1327 (15) 120 (1.4)   Urine (mL/kg/hr) 1350 (0.6) 350 (0.5)   Total Output 1350 350   Net -23 -230        Urine Occurrence 1 x    Stool Occurrence 1 x      PHYSICAL EXAMINATION: Gen: chronically ill appearing, no acute distress HEENT: NCAT, PERRL, EOMi, OP clear, neck supple without masses, decreased hearing PULM: UA noise, decreased BS, few scattered wheezes CV: tachy, regular, no mgr, no JVD AB: BS+, soft, nontender, no hsm Ext: warm, trace edema, no clubbing, no cyanosis Derm: no rash or skin breakdown Neuro: Awake and alert, following commands and answering questions but very hard of hearing, MAEW  LABS:  Recent  Labs Lab 10/27/12 2005  10/27/12 2013  10/28/12 0153 10/29/12 0504 10/31/12 0525 11/01/12 0600 11/03/12 0625  HGB  --   --   --   < > 11.7* 12.9*  --  12.0* 13.1  WBC  --   --   --   < > 8.6 16.6*  --  8.0 10.5  PLT  --   --   --   < > 173 215  --  203 267  NA  --   < > 139  --  137 139 138 140 137  K  --   < > 3.8  --  3.3* 4.7 4.6 4.1 3.7  CL  --   < > 100  --  99 105 102 103 99  CO2  --   < > 25  --  19 22 26 29 25   GLUCOSE  --   < > 130*  --  284* 145* 129* 115* 119*  BUN  --   < > 22  --  24* 34* 28* 32* 28*  CREATININE  --   < > 1.50*  --  1.58* 1.60* 1.47* 1.43* 1.42*  CALCIUM  --   < > 9.3  --  8.6 9.0 9.1 9.1 9.1  MG  --   --   --   --   --   --   --  2.0  --   AST  --   --  22  --   --   --   --   --  17  ALT  --   --  12  --   --   --   --   --  12  ALKPHOS  --   --  96  --   --   --   --   --  72  BILITOT  --   --  0.4  --   --   --   --   --  0.3  PROT  --   --  6.4  --   --   --   --   --  5.6*  ALBUMIN  --   --  3.5  --   --   --   --   --  2.8*  TROPONINI  --   --  <0.30  --   --   --   --   --   --   PROBNP  --   --   --   --  250.0  --   --   --   --   PHART 7.439  --   --   --   --   --   --   --   --   PCO2ART 34.8*  --   --   --   --   --   --   --   --   PO2ART 62.0*  --   --   --   --   --   --   --   --   < > = values in this interval not displayed.  Recent Labs Lab 11/02/12 1132 11/02/12 1602 11/02/12 2137 11/03/12 0602 11/03/12 1111  GLUCAP 221* 171* 152* 106* 187*   ASSESSMENT / PLAN:  PULMONARY A:  1) AE COPD>> based on increased wheezing and cough; heavy prior smoking history  P:   -taper steroids -change to symbicort/spiriva -po levaquin D/c planning  CARDIOVASCULAR A:  1) Ischemic cardiomyopathy- Diastolic dysfunction based on 1610 Echo 2) CAD 3) Tachycardia - presumably due to many rounds of albuterol at home and in ED  P:   -continue home ASA, B-blocker, statin, changed to ARB -tele   RENAL A:  CKD, baseline Cr 1.3,   P:  -HCTZ held on admit due to increased sr cr, restarted 2/7    GASTROINTESTINAL A:  Chronic constipation P:   -miralax  HEMATOLOGIC A:  No acute issues P:  -monitor for bleeding  INFECTIOUS A:  AE COPD P:   -levaquin  ENDOCRINE A:  DM2 exacerbated by hyperglycemia - BS= 110-299 P:   -SSI and CBG  NEUROLOGIC A:   1) Dementia with nocturnal delirium 2 )Acute Delirium in setting of corticosteroid usage for AECOPD.   3) Rt shoulder and UE pain - not clear whether this is MSK pain vs neuropathic. R shoulder films with tendonitis and OA, no fx's P:   -decreased steroids further -frequent orientation -continue home aricept 5mg  -trazadone -cont  low dose seroquel qhs 2/6    TODAY'S SUMMARY:  79 y/o male with COPD who presents with an AE COPD.  decreased wheeze.  Seems some component of upper airway in addition to lower wheeze.  Resume HCTZ and monitor sr cr.  => f/u labs ordered. 2/7- Assessed for home O2 needs=> 93% RA at rest, 91% RA w/ ambulation  D/C planning, plan d/c to home 2/11 Dorcas Carrow Beeper  [phone]  Cell  [phone]  If no response or cell goes to voicemail, call beeper [phone]   11/03/2012, 2:56 PM

## 2012-11-04 LAB — GLUCOSE, CAPILLARY
Glucose-Capillary: 149 mg/dL — ABNORMAL HIGH (ref 70–99)
Glucose-Capillary: 228 mg/dL — ABNORMAL HIGH (ref 70–99)

## 2012-11-04 LAB — CREATININE, SERUM
GFR calc Af Amer: 43 mL/min — ABNORMAL LOW (ref >90)
GFR calc non Af Amer: 37 mL/min — ABNORMAL LOW (ref >90)

## 2012-11-04 MED ORDER — LOSARTAN POTASSIUM 50 MG PO TABS: 50.0000 mg | ORAL_TABLET | Freq: Every day | ORAL | Status: DC

## 2012-11-04 MED ORDER — AEROCHAMBER PLUS FLO-VU MEDIUM MISC: 1.0000 | Freq: Once | Status: DC

## 2012-11-04 MED ORDER — PREDNISONE 10 MG PO TABS: ORAL_TABLET | ORAL | Status: DC

## 2012-11-04 NOTE — Progress Notes (Signed)
Patient's IV and telemetry has been discontinued; patient's wife verbalizes understanding of discharge instructions.  Patient is being discharged home with wife.  Lorretta Harp RN

## 2012-11-04 NOTE — Evaluation (Signed)
Occupational Therapy Evaluation and Discharge Patient Details Name: [name] MRN: [id number] DOB: [birth year] Today's Date: 11/04/2012 Time: 4098-1191 OT Time Calculation (min): 30 min  OT Assessment / Plan / Recommendation Clinical Impression  This 79 yo male admitted and found to have COPD exacerbation presents to acute OT at a S to Min guard A level. No further OT needs, will sign off.       Follow Up Recommendations  No OT follow up       Equipment Recommendations  None recommended by OT          Precautions / Restrictions Precautions Precautions: Fall Restrictions Weight Bearing Restrictions: No   Pertinent Vitals/Pain Sats on RA no lower than 92 even with activity, just DOE    ADL  Equipment Used: Gait belt;Rolling walker Transfers/Ambulation Related to ADLs: Min guard A for all ADL Comments: Pt overall at a S/min guard A  level with BADLs and wife able to provide this. Has all DME needed.       Visit Information  Last OT Received On: 11/04/12 Assistance Needed: +1 PT/OT Co-Evaluation/Treatment: Yes       Prior Functioning     Home Living Lives With: Spouse Available Help at Discharge: Available 24 hours/day Type of Home: House Home Access: Stairs to enter [employer] of Steps: 2 (to get into the kitchen) Entrance Stairs-Rails: Right;Left Home Layout: One level Bathroom Shower/Tub: Engineer, manufacturing systems: Handicapped height Bathroom Accessibility: Yes Home Adaptive Equipment: Grab bars in shower;Grab bars around toilet;Straight cane;Walker - rolling Additional Comments: sits in the tub Prior Function Level of Independence: Independent Driving: Yes Vocation: Retired Musician: [employer] Dominant Hand: Right            Cognition  Cognition Overall Cognitive Status: Impaired Area of Impairment: Attention;Safety/judgement Arousal/Alertness: Awake/alert Orientation Level: Appears intact for tasks  assessed Current Attention Level: Sustained;Selective Safety/Judgement: Impulsive;Decreased awareness of need for assistance;Decreased safety judgement for tasks assessed Cognition - Other Comments: difficulty following commands due to [facility]    Extremity/Trunk Assessment Right Upper Extremity Assessment RUE ROM/Strength/Tone: [facility] for tasks assessed Left Upper Extremity Assessment LUE ROM/Strength/Tone: WFL for tasks assessed Right Lower Extremity Assessment RLE ROM/Strength/Tone: [facility] for tasks assessed Left Lower Extremity Assessment LLE ROM/Strength/Tone: WFL for tasks assessed Trunk Assessment Trunk Assessment: Normal     Mobility Bed Mobility Bed Mobility: Not assessed Transfers Sit to Stand: 4: Min guard;With upper extremity assist;From chair/3-in-1;From toilet;With armrests;From bed Stand to Sit: 4: Min guard;To chair/3-in-1;To toilet;With armrests;With upper extremity assist Details for Transfer Assistance: cues for safe hand placement and gaurding for stability and safety given pt's impulsivity, decreased safety awareness and balance deficits        Balance Balance Balance Assessed: Yes Static Standing Balance Static Standing - Balance Support: No upper extremity supported Static Standing - Level of Assistance: 5: Stand by assistance Static Standing - Comment/# of Minutes: able to wash his hands at the sink with mingaurdA   End of Session OT - End of Session Equipment Utilized During Treatment: Gait belt Activity Tolerance: Patient tolerated treatment well Patient left: in chair;with call bell/phone within reach       Evette Georges [phone] 11/04/2012, 10:42 AM

## 2012-11-04 NOTE — Progress Notes (Signed)
Physician Discharge Summary  Patient ID: [name] MRN: [id number] DOB/AGE: Aug 03, 1927 79 y.o.  Admit date: 10/27/2012 Discharge date: 11/04/2012    Discharge Diagnoses:  Acute Exacerbation of COPD Ischemic Cardiomyopathy CAD Tachycardia Chronic Renal Disease  Chronic Constipation Hyperglycemia Dementia Acute Delirium R Shoulder / UE Pain   Brief Summary: KEROLOS NEHME is a 79 y.o. y/o male with a PMH of COPD was admitted on 2/3 from [facility] for increased shortness of breath, cough with yellow sputum production in setting of acute exacerbation of COPD.  In ER had significant respiratory distress, tachycardia (presumed in response to SOB / multiple nebulized bronchodilators).  He required placement of femoral central line due to inability to gain IV access in ER.  Hospital course complicated by acute delirium in setting of dementia, steroid administration.  Patient complained of R shoulder pain during admit with negative plain films.  He did have significant wheezing / distress upon admit which does have some component of upper airway in addition to lower wheeze.  HTN regimen was changed to ARB in setting of cough (unclear was contributing factor vs COPD).  Assessed for home Oxygen needs prior to discharge and maintain saturations >91% with ambulation.  Physical Therapy evaluated patient and recommend 24 hour supervision as well as home PT. Patient had intermittent episodes of agitated delirium during hospitalization.  Resolved prior to discharge.   CONSULTS Physical Therapy  SIGNIFICANT EVENTS / STUDIES:  2/4 - called by RN for significant wheezing, no distress.  C/o right shoulder pain.   R shoulder films > calcific tendonitis of rotator cuff, progressive OA of the AC joint, no fractures   LINES / TUBES:  2/3 L Femoral CVL >>d/c   CULTURES:  2/4 MRS PCR>>>neg   ANTIBIOTICS:  2/3 Levaquin (AE COPD) >> 2/7    Hospital Summary by Discharge Diagnosis  Acute Exacerbation of COPD Initially admitted on 2/3 from [facility] for increased shortness of breath, cough with yellow sputum production in setting of acute exacerbation of COPD.  In ER had significant respiratory distress, tachycardia (presumed in response to SOB / multiple nebulized bronchodilators).  Was transferred to [facility] for further treatment.  Did have significant distress, upper and lower airway wheezing which resolved with nebulized bronchodilators and IV steroids.  He made slow progress during admit.  Required oxygen.  Was treated with IV levaquin and transitioned to PO.  Completed abx prior to discharge. Ambulated for home oxygen needs / did not qualify.   Discharge Plan: -follow up with Dr. Craige Cotta in Pulmonary Office for COPD evaluation / pulmonary work up -continue albuterol with aerochamber, symbicort, duonebs PRN, and spiriva -slow prednisone taper   Ischemic Cardiomyopathy CAD Tachycardia HTN regimen was changed to ARB in setting of cough (unclear was contributing factor vs COPD).   Patient had tachycardia in ER (presumed in response to SOB / multiple nebulized bronchodilators). Resolved prior to discharge.   Discharge Plan: -continue ARB -stop ACE inhibitor -follow up with Dr. Swaziland as needed.   Chronic Renal Disease  Baseline sr Cr 1.3.  Sr. Cr ranged from 1.5 to 1.6 during hospitalization.    Discharge Plan: -recommend follow up BMP with PCP   Chronic Constipation Maintained on bowel regimen during admit.    Discharge Plan: -continue home Miralax  Hyperglycemia In setting of acute steroid administration.  Likely will resolve as steroids taper.    Dementia Acute Delirium R Shoulder / UE Pain Known dementia on aricept.  Hospital course complicated by multiple episodes of  acute agitated delirium / sun downing.  Most likely secondary to steroid administration for AE COPD.  Also, Patient complained of R shoulder pain  during admit with negative plain films.  Discharge Plan: -continue aricept -taper steroids  Discharge Exam: Gen: chronically ill appearing, no acute distress  HEENT: NCAT, PERRL, EOMi, OP clear, neck supple without masses, decreased hearing  PULM: UA noise, decreased BS, few scattered wheezes  CV: tachy, regular, no mgr, no JVD  AB: BS+, soft, nontender, no hsm  Ext: warm, trace edema, no clubbing, no cyanosis  Derm: no rash or skin breakdown  Neuro: Awake and alert, following commands and answering questions but very hard of hearing, MAEW   Filed Vitals:   11/04/12 0740 11/04/12 0819 11/04/12 0849 11/04/12 0943  BP:    115/66  Pulse:  82  80  Temp:    98 F (36.7 C)  TempSrc:    Oral  Resp:    20  Height:      Weight:      SpO2: 98% 97% 94% 92%   Discharge Labs  BMET  Recent Labs Lab 10/29/12 0504 10/31/12 0525 11/01/12 0600 11/03/12 0625 11/04/12 0505  NA 139 138 140 137  --   K 4.7 4.6 4.1 3.7  --   CL 105 102 103 99  --   CO2 22 26 29 25   --   GLUCOSE 145* 129* 115* 119*  --   BUN 34* 28* 32* 28*  --   CREATININE 1.60* 1.47* 1.43* 1.42* 1.63*  CALCIUM 9.0 9.1 9.1 9.1  --   MG  --   --  2.0  --   --    CBC  Recent Labs Lab 10/29/12 0504 11/01/12 0600 11/03/12 0625  HGB 12.9* 12.0* 13.1  HCT 38.2* 36.5* 38.7*  WBC 16.6* 8.0 10.5  PLT 215 203 267   Discharge Orders   Future Appointments Provider Department Dept Phone   11/24/2012 11:00 AM Coralyn Helling, MD [facility] [phone]   11/26/2012 11:00 AM Peter M Sw[place], MD [facility] Main Office [place]) [phone]   01/07/2013 11:00 AM Hillis Range, MD [facility] Main Office [place]) [phone]   Future Orders Complete By Expires     Call MD for:  difficulty breathing, headache or visual disturbances  As directed     Call MD for:  persistant dizziness or light-headedness  As directed     Call MD for:  temperature >100.4  As directed     Diet - low sodium heart healthy  As  directed     Discharge instructions  As directed     Comments:      Review your medications carefully as they have changed.  You are on a new blood pressure medication.  Continue to use your inhalers with the spacer.    Increase activity slowly  As directed       Follow-up Information   Follow up with Delorse Lek, MD On 11/11/2012. (Appt at 11:10 AM.  )    Contact information:   [address] [place] [address] [phone]       Follow up with Coralyn Helling, MD On 11/24/2012. (Appt at 11:00.  Arrive at 10:45.  )    Contact information:   [address] [facility]  [place] [address] [phone]        Medication List    STOP taking these medications       benazepril 20 MG tablet  Commonly known  as:  LOTENSIN      TAKE these medications       AEROCHAMBER PLUS FLO-VU MEDIUM Misc  1 each by Other route once.     albuterol 108 (90 BASE) MCG/ACT inhaler  Commonly known as:  PROVENTIL HFA;VENTOLIN HFA  Inhale 2 puffs into the lungs every 4 (four) hours as needed. For shortness of breath     aspirin 325 MG tablet  Take 162.5 mg by mouth daily.     budesonide-formoterol 160-4.5 MCG/ACT inhaler  Commonly known as:  SYMBICORT  Inhale 1 puff into the lungs 2 (two) times daily.     CENTRUM SILVER PO  Take 1 tablet by mouth every morning.     ICAPS PO  Take 1 tablet by mouth daily.     donepezil 5 MG tablet  Commonly known as:  ARICEPT  Take 5 mg by mouth at bedtime.     ezetimibe 10 MG tablet  Commonly known as:  ZETIA  Take 10 mg by mouth daily.     FISH OIL PO  Take 2 capsules by mouth daily.     folic acid 1 MG tablet  Commonly known as:  FOLVITE  Take 1 mg by mouth daily.     hydrochlorothiazide 25 MG tablet  Commonly known as:  HYDRODIURIL  Take 12.5 mg by mouth daily.     ipratropium-albuterol 0.5-2.5 (3) MG/3ML Soln  Commonly known as:  DUONEB  Take 3 mLs by nebulization every 6 (six) hours as needed. For shortness of breath      losartan 50 MG tablet  Commonly known as:  COZAAR  Take 1 tablet (50 mg total) by mouth daily.     MAGNESIUM SULFATE PO  Take 1 tablet by mouth daily.     metoprolol 50 MG tablet  Commonly known as:  LOPRESSOR  Take 50 mg by mouth 2 (two) times daily.     nitroGLYCERIN 0.4 MG SL tablet  Commonly known as:  NITROSTAT  Place 0.4 mg under the tongue every 5 (five) minutes as needed. For chest pain     omeprazole 20 MG tablet  Commonly known as:  PRILOSEC OTC  Take 20 mg by mouth 2 (two) times daily.     polyethylene glycol packet  Commonly known as:  MIRALAX / GLYCOLAX  Take 17 g by mouth daily.     predniSONE 10 MG tablet  Commonly known as:  DELTASONE  3 tabs daily for 4 days, then 2 tabs daily for 4 days, then 1 tab daily for 4 days then stop     simvastatin 20 MG tablet  Commonly known as:  ZOCOR  Take 20 mg by mouth daily.     tiotropium 18 MCG inhalation capsule  Commonly known as:  SPIRIVA  Place 18 mcg into inhaler and inhale daily.          Disposition: 01-Home or Self Care with home Physical Therapy.  No other home health needs identified prior to discharge.    Discharged Condition: LAVERNE KLUGH has met maximum benefit of inpatient care and is medically stable and cleared for discharge.  Patient is pending follow up as above.      Time spent on disposition:  Greater than 35 minutes.   Signed: Canary Brim, NP-C [facility] Pgr: [phone]    Pt seen and examined with NP and I agree with the plan of care as outlined above. Luisa Hart WrightMD Beeper  [phone]  Cell  [phone]  If no response or cell goes to voicemail, call beeper [phone]

## 2012-11-04 NOTE — Care Management Note (Signed)
    Page 1 of 2   11/04/2012     1:38:52 PM   CARE MANAGEMENT NOTE 11/04/2012  Patient:  MICHELANGELO, RINDFLEISCH   Account Number:  [id number]  Date Initiated:  10/31/2012  Documentation initiated by:  [facility]  Subjective/Objective Assessment:   79 y/o male with COPD was admitted on 2/3 from [facility] for increased shortness of breath presumably due to an AE of COPD.     Action/Plan:   -hold albuterol  -continue home ASA, B-blocker, statin, ACE-in  -tele  -gentle IVF, monitor fluid status closely   Anticipated DC Date:  11/02/2012   Anticipated DC Plan:  HOME/SELF CARE      DC Planning Services  CM consult      [facility] Choice  HOME HEALTH   Choice offered to / List presented to:  C-1 Patient        HH arranged  HH-2 PT      HH agency  [facility]   Status of service:  Completed, signed off Medicare Important Message given?   (If response is "NO", the following Medicare IM given date fields will be blank) Date Medicare IM given:   Date Additional Medicare IM given:    Discharge Disposition:  HOME/SELF CARE  Per UR Regulation:  Reviewed for med. necessity/level of care/duration of stay  If discussed at Long Length of Stay Meetings, dates discussed:    Comments:  11/04/12 @ [address] RN,BSN,NCM [phone]. Spoke with pt and wife concerning HHPT needs.  Both agreed to services and chose [facility] for services. Lupita Leash with [facility] notified.

## 2012-11-04 NOTE — Evaluation (Signed)
Physical Therapy Evaluation Patient Details Name: [name] MRN: [id number] DOB: 23-Jan-1927 Today's Date: 11/04/2012 Time: 9563-8756    PT Assessment / Plan / Recommendation Clinical Impression  79 y/o male admitted for acute exacerbation of COPD. Presents to PT today with below impairments affecting safety and independence with mobility. Will benefit physical therapy in the acute setting to maximize safety for d/c home. Rec. HHPT. Attempted to discuss this with patient but he is very hard of hearing so I spoke in detail with wife concerning pt's impulsivity and risk for falls in addition to our recommendation for 24/7 supervision/assistance especially when mobilizing and getting down into tub.     PT Assessment  Patient needs continued PT services    Follow Up Recommendations  Home health PT;Supervision/Assistance - 24 hour    Does the patient have the potential to tolerate intense rehabilitation      Barriers to Discharge        Equipment Recommendations  None recommended by PT    Recommendations for Other Services     Frequency Min 3X/week    Precautions / Restrictions Precautions Precautions: Fall Restrictions Weight Bearing Restrictions: No   Pertinent Vitals/Pain 94% on RA following ambulation      Mobility  Bed Mobility Bed Mobility: Not assessed Transfers Transfers: Sit to Stand;Stand to Sit Sit to Stand: 4: Min guard;With upper extremity assist;From chair/3-in-1;From toilet;With armrests;From bed Stand to Sit: 4: Min guard;To chair/3-in-1;To toilet;With armrests;With upper extremity assist Details for Transfer Assistance: cues for safe hand placement and gaurding for stability and safety given pt's impulsivity, decreased safety awareness and balance deficits Ambulation/Gait Ambulation/Gait Assistance: 4: Min guard Ambulation Distance (Feet): 150 Feet Assistive device: Rolling walker Ambulation/Gait Assistance Details: cues for tall posture and safe  speed (pt ambulating with increased speed and poor awareness of safety with RW especially with turns); pt with increased DOE during and following ambulation (2/4) Gait Pattern: Wide base of support;Decreased stride length General Gait Details: decreased step height bilaterally Stairs: No Wheelchair Mobility Wheelchair Mobility: No         PT Diagnosis: Abnormality of gait  PT Problem List: Decreased activity tolerance;Decreased balance;Decreased mobility;Decreased cognition;Decreased safety awareness;Cardiopulmonary status limiting activity PT Treatment Interventions: DME instruction;Gait training;Functional mobility training;Therapeutic activities;Therapeutic exercise;Balance training;Neuromuscular re-education;Cognitive remediation;Patient/family education   PT Goals Acute Rehab PT Goals PT Goal Formulation: With patient Time For Goal Achievement: 11/11/12 Potential to Achieve Goals: Good Pt will go Sit to Stand: with modified independence PT Goal: Sit to Stand - Progress: Goal set today Pt will go Stand to Sit: with modified independence PT Goal: Stand to Sit - Progress: Goal set today Pt will Transfer Bed to Chair/Chair to Bed: with modified independence PT Transfer Goal: Bed to Chair/Chair to Bed - Progress: Goal set today Pt will Ambulate: >150 feet;with modified independence PT Goal: Ambulate - Progress: Goal set today Pt will Perform Home Exercise Program: Independently PT Goal: Perform Home Exercise Program - Progress: Goal set today  Visit Information  Last PT Received On: 11/04/12 Assistance Needed: +1    Subjective Data  Subjective: Can I lay back down? Patient Stated Goal: per notes, wife wants to take him home   Prior Functioning  Home Living Lives With: Spouse Available Help at Discharge: Available 24 hours/day Type of Home: House Home Access: Stairs to enter [employer] of Steps: 2 (to get into the kitchen) Entrance Stairs-Rails: Right;Left Home  Layout: One level Bathroom Shower/Tub: Engineer, manufacturing systems: Handicapped height Bathroom Accessibility: Yes Home  Adaptive Equipment: Grab bars in shower;Grab bars around toilet;Straight cane;Walker - rolling Additional Comments: sits in the tub Prior Function Level of Independence: Independent Driving: Yes Vocation: Retired Musician: HOH Dominant Hand: Right    Cognition  Cognition Overall Cognitive Status: Impaired Area of Impairment: Attention;Safety/judgement Arousal/Alertness: Awake/alert Orientation Level: Appears intact for tasks assessed Current Attention Level: Sustained;Selective Safety/Judgement: Impulsive;Decreased awareness of need for assistance;Decreased safety judgement for tasks assessed Cognition - Other Comments: difficulty following commands due to [facility]    Extremity/Trunk Assessment Right Upper Extremity Assessment RUE ROM/Strength/Tone: [facility] for tasks assessed Left Upper Extremity Assessment LUE ROM/Strength/Tone: WFL for tasks assessed Right Lower Extremity Assessment RLE ROM/Strength/Tone: WFL for tasks assessed Left Lower Extremity Assessment LLE ROM/Strength/Tone: WFL for tasks assessed Trunk Assessment Trunk Assessment: Normal   Balance Balance Balance Assessed: Yes Static Standing Balance Static Standing - Balance Support: No upper extremity supported Static Standing - Level of Assistance: 5: Stand by assistance Static Standing - Comment/# of Minutes: able to wash his hands at the sink with mingaurdA  End of Session PT - End of Session Equipment Utilized During Treatment: Gait belt Activity Tolerance: Patient tolerated treatment well Patient left: in chair;with call bell/phone within reach (pt in camera room) Nurse Communication: Mobility status  GP     [facility] HELEN 11/04/2012, 8:58 AM

## 2012-11-10 ENCOUNTER — Telehealth: Payer: Self-pay | Admitting: Pulmonary Disease

## 2012-11-10 NOTE — Telephone Encounter (Signed)
Noted  

## 2012-11-10 NOTE — Telephone Encounter (Signed)
[employer] at number provided, no answer. [id number]

## 2012-11-10 NOTE — Telephone Encounter (Signed)
[name] states this was pretty much a FYI.  [name] will be in & out w/ patients all day long, so please leave a message if we have any questions.  [name]

## 2012-11-10 NOTE — Telephone Encounter (Signed)
Will forward to Dr Sood as FYI  

## 2012-11-11 ENCOUNTER — Encounter: Payer: Self-pay | Admitting: *Deleted

## 2012-11-24 ENCOUNTER — Ambulatory Visit (INDEPENDENT_AMBULATORY_CARE_PROVIDER_SITE_OTHER): Payer: Medicare Other | Admitting: Pulmonary Disease

## 2012-11-24 ENCOUNTER — Encounter: Payer: Self-pay | Admitting: Pulmonary Disease

## 2012-11-24 VITALS — BP 112/72 | HR 65 | Temp 97.0°F | Ht 67.0 in | Wt 211.0 lb

## 2012-11-24 DIAGNOSIS — J438 Other emphysema: Secondary | ICD-10-CM

## 2012-11-24 MED ORDER — BUDESONIDE-FORMOTEROL FUMARATE 160-4.5 MCG/ACT IN AERO: 1.0000 | INHALATION_SPRAY | Freq: Every day | RESPIRATORY_TRACT | Status: DC

## 2012-11-24 NOTE — Progress Notes (Signed)
Chief Complaint  Patient presents with  . Hospital Follow Up    Breathing has been doing well, but has bad days from time to time.    History of Present Illness: [name] is a 79 y.o. male former smoker with Severe COPD/emphysema.  He was in hospital recently for AECOPD.  He was having dyspnea, and wheezing.  This has improved.  He is not longer taking prednisone.  He did not need oxygen from the hospital.  He has been using 1 puff symbicort bid, and spiriva 1 puff qdaily.  He has been using duoneb bid because he was told to do so.  He does not use his albuterol inhaler.  He is not sure if these medicines help much.  He is not having much cough or sputum.  He denies chest pain, or fever.  He is not very active, and gets winded easily.   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  [name]  has past surgical history that includes Cholecystectomy; pacemaker placement (01/01/11); and pancreatic stent.  Prior to Admission medications   Medication Sig Start Date End Date Taking? Authorizing Provider  albuterol (PROVENTIL  HFA;VENTOLIN HFA) 108 (90 BASE) MCG/ACT inhaler Inhale 2 puffs into the lungs every 4 (four) hours as needed. For shortness of breath   Yes Historical Provider, MD  aspirin 325 MG tablet Take 162.5 mg by mouth daily.    Yes Historical Provider, MD  budesonide-formoterol (SYMBICORT) 160-4.5 MCG/ACT inhaler Inhale 1 puff into the lungs 2 (two) times daily.   Yes Historical Provider, MD  donepezil (ARICEPT) 5 MG tablet Take 5 mg by mouth at bedtime.   Yes Historical Provider, MD  ezetimibe (ZETIA) 10 MG tablet Take 10 mg by mouth daily.    Yes Historical Provider, MD  folic acid (FOLVITE) 1 MG tablet Take 1 mg by mouth daily.   Yes Historical Provider, MD  hydrochlorothiazide (HYDRODIURIL) 25 MG tablet Take 12.5 mg by mouth daily.   Yes Historical Provider, MD  ipratropium-albuterol (DUONEB) 0.5-2.5 (3) MG/3ML SOLN Take 3 mLs by nebulization every 6 (six) hours as needed. For shortness of breath   Yes Historical Provider, MD  losartan (COZAAR) 50 MG tablet Take 1 tablet (50 mg total) by mouth daily. 11/04/12  Yes Jeanella Craze, NP  MAGNESIUM SULFATE PO Take 1 tablet by mouth daily.   Yes Historical Provider, MD  metoprolol (LOPRESSOR) 50 MG tablet Take 50 mg by mouth 2 (two) times daily.   Yes Historical Provider, MD  Multiple Vitamins-Minerals (CENTRUM SILVER PO) Take 1 tablet by mouth every morning.    Yes Historical Provider, MD  Multiple Vitamins-Minerals (ICAPS PO) Take 1 tablet by mouth daily.   Yes Historical Provider, MD  nitroGLYCERIN (NITROSTAT) 0.4 MG  SL tablet Place 0.4 mg under the tongue every 5 (five) minutes as needed. For chest pain   Yes Historical Provider, MD  Omega-3 Fatty Acids (FISH OIL PO) Take 2 capsules by mouth daily.   Yes Historical Provider, MD  omeprazole (PRILOSEC OTC) 20 MG tablet Take 20 mg by mouth 2 (two) times daily.   Yes Historical Provider, MD  polyethylene glycol (MIRALAX / GLYCOLAX) packet Take 17 g by mouth daily.   Yes Historical Provider, MD  predniSONE  (DELTASONE) 10 MG tablet 3 tabs daily for 4 days, then 2 tabs daily for 4 days, then 1 tab daily for 4 days then stop 11/04/12  Yes Jeanella Craze, NP  simvastatin (ZOCOR) 20 MG tablet Take 20 mg by mouth daily.   Yes Historical Provider, MD  Spacer/Aero-Holding Chambers (AEROCHAMBER PLUS FLO-VU MEDIUM) MISC 1 each by Other route once. 11/04/12  Yes Jeanella Craze, NP  tiotropium (SPIRIVA) 18 MCG inhalation capsule Place 18 mcg into inhaler and inhale daily.   Yes Historical Provider, MD    Allergies  Allergen Reactions  . Doxycycline     Unknown- patient doesn't remember  . Statins     Takes simvastatin at home  . Penicillins Rash     Physical Exam:  General - No distress ENT - No sinus tenderness, no oral exudate, no LAN Cardiac - s1s2 regular, no murmur Chest - Decreased breath sounds, no wheeze/rales/dullness Back - No focal tenderness Abd - Soft, non-tender Ext - No edema Neuro - Normal strength, poor hearing Skin - No rashes Psych - normal mood, and behavior   Assessment/Plan:  Todd Helling, MD [facility] Pager:  [phone] 11/24/2012, 10:54 AM

## 2012-11-24 NOTE — Patient Instructions (Signed)
Change symbicort to one puff daily for two weeks >> if breathing is okay, then stop symbicort in 2 weeks Continue spiriva one puff daily Use duoneb in nebulizer up to four times per day as needed for cough, wheeze, or chest congestion Albuterol inhaler 2 puffs up to four times as needed for cough, wheeze, or chest congestion Follow up in 3 months

## 2012-11-24 NOTE — Assessment & Plan Note (Signed)
He has severe airflow obstruction on spirometry today.  His previous CT chest shows changes of emphysema.  He is not sure how much his inhalers are helping.  Will have him continue spiriva, and prn duoneb/albuterol.    Will have him gradual decrease his symbicort as tolerated.

## 2012-11-26 ENCOUNTER — Encounter: Payer: Self-pay | Admitting: Cardiology

## 2012-11-26 ENCOUNTER — Ambulatory Visit (INDEPENDENT_AMBULATORY_CARE_PROVIDER_SITE_OTHER): Payer: Medicare Other | Admitting: Cardiology

## 2012-11-26 VITALS — BP 130/76 | HR 49 | Ht 67.0 in | Wt 211.8 lb

## 2012-11-26 DIAGNOSIS — I2510 Atherosclerotic heart disease of native coronary artery without angina pectoris: Secondary | ICD-10-CM

## 2012-11-26 DIAGNOSIS — E785 Hyperlipidemia, unspecified: Secondary | ICD-10-CM

## 2012-11-26 DIAGNOSIS — I5022 Chronic systolic (congestive) heart failure: Secondary | ICD-10-CM

## 2012-11-26 DIAGNOSIS — I10 Essential (primary) hypertension: Secondary | ICD-10-CM

## 2012-11-26 MED ORDER — METOPROLOL TARTRATE 50 MG PO TABS: 50.0000 mg | ORAL_TABLET | Freq: Two times a day (BID) | ORAL | Status: DC

## 2012-11-26 MED ORDER — LOSARTAN POTASSIUM 50 MG PO TABS: 50.0000 mg | ORAL_TABLET | Freq: Every day | ORAL | Status: DC

## 2012-11-26 NOTE — Patient Instructions (Addendum)
Stop Zetia   Continue your other medication  I will see you again in 6 months.

## 2012-11-26 NOTE — Progress Notes (Signed)
[name] Date of Birth: 1926/11/05   History of Present Illness: Mr. Kerlin is seen for followup today. He is status post acute anterior myocardial infarction in April of 2012 treated with direct stenting of the mid LAD. He also had second-degree heart block with significant bradycardia that was symptomatic and he underwent a permanent pacemaker. His major complaint today is some loss of hearing. He was hospitalized for 8 days one month ago with COPD exacerbation. He was placed on steroid therapy. He has gained 14 pounds since his last visit here. His ACE inhibitor was also switched to an ARB. His wife reports that Zetia is too expensive for him now. He denies any significant chest pain. He has mild right ankle edema. He shortness of breath is back to his baseline. His wife reports that he is very sedentary and only gets up to go to the dinner table.  Current Outpatient Prescriptions on File Prior to Visit  Medication Sig Dispense Refill  . albuterol (PROVENTIL HFA;VENTOLIN HFA) 108 (90 BASE) MCG/ACT inhaler Inhale 2 puffs into the lungs every 4 (four) hours as needed. For shortness of breath      . aspirin 325 MG tablet Take 162.5 mg by mouth daily.       . budesonide-formoterol (SYMBICORT) 160-4.5 MCG/ACT inhaler Inhale 1 puff into the lungs daily.  1 Inhaler    . donepezil (ARICEPT) 5 MG tablet Take 5 mg by mouth at bedtime.      Marland Kitchen ezetimibe (ZETIA) 10 MG tablet Take 10 mg by mouth daily.       . folic acid (FOLVITE) 1 MG tablet Take 1 mg by mouth daily.      . hydrochlorothiazide (HYDRODIURIL) 25 MG tablet Take 12.5 mg by mouth daily.      Marland Kitchen ipratropium-albuterol (DUONEB) 0.5-2.5 (3) MG/3ML SOLN Take 3 mLs by nebulization every 6 (six) hours as needed. For shortness of breath      . MAGNESIUM SULFATE PO Take 1 tablet by mouth daily.      . Multiple Vitamins-Minerals (CENTRUM SILVER PO) Take 1 tablet by mouth every morning.       . Multiple Vitamins-Minerals (ICAPS PO) Take 1 tablet  by mouth daily.      . nitroGLYCERIN (NITROSTAT) 0.4 MG SL tablet Place 0.4 mg under the tongue every 5 (five) minutes as needed. For chest pain      . Omega-3 Fatty Acids (FISH OIL PO) Take 2 capsules by mouth daily.      Marland Kitchen omeprazole (PRILOSEC OTC) 20 MG tablet Take 20 mg by mouth 2 (two) times daily.      . polyethylene glycol (MIRALAX / GLYCOLAX) packet Take 17 g by mouth daily.      . simvastatin (ZOCOR) 20 MG tablet Take 20 mg by mouth daily.      Marland Kitchen Spacer/Aero-Holding Chambers (AEROCHAMBER PLUS FLO-VU MEDIUM) MISC 1 each by Other route once.      . tiotropium (SPIRIVA) 18 MCG inhalation capsule Place 18 mcg into inhaler and inhale daily.       No current facility-administered medications on file prior to visit.    Allergies  Allergen Reactions  . Doxycycline     Unknown- patient doesn't remember  . Statins     Takes simvastatin at home  . Penicillins Rash    Past Medical History  Diagnosis Date  . Chest discomfort   . Coronary artery disease   . Diabetes mellitus   . Hypertension   .  Hyperlipidemia   . Obesities, morbid   . Gallstone pancreatitis     recurrent  . TIA (transient ischemic attack)   . Bacteremia due to vancomycin resistant Enterococcus   . GERD (gastroesophageal reflux disease)   . Hearing impairment   . Myocardial infarction, anterior wall, subsequent care   . Second degree AV block, Mobitz type II     s/p PPM by JA 4/12  . ARF (acute renal failure)   . Cholangitis   . Duodenal ulcer   . GI bleed   . Anemia associated with acute blood loss   . 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 Dementia in Alzheimer's disease with delirium     Past Surgical History  Procedure Laterality Date  . Cholecystectomy    . Pacemaker placement  01/01/11    implanted by JA for mobitz II AV block  . Pancreatic stent      History  Smoking status  . Former Smoker -- 1.00 packs/day for 57 years  . Types: Cigarettes  . Quit date:  01/12/1996  Smokeless tobacco  . Never Used    History  Alcohol Use No    History reviewed. No pertinent family history.  Review of Systems: The review of systems is positive for generalized fatigue. He is very hard of hearing. He reports his vision is worse. All other systems were reviewed and are negative.  Physical Exam: BP 130/76  Pulse 49  Ht 5\' 7"  (1.702 m)  Wt 211 lb 12.8 oz (96.072 kg)  BMI 33.16 kg/m2  SpO2 96% He is an elderly overweight white male who is very hard of hearing. He is in no distress. HEENT exam reveals he is wearing hearing aid. His pupils are equal round and reactive. Sclera are clear. Oropharynx is clear. Neck is without JVD, adenopathy, or bruits. Lungs are clear with diminished breath sounds throughout. Cardiac exam reveals a regular rate and rhythm without gallop, murmur, or click. His pacemaker site is normal. Abdomen is soft and nontender without masses or hepatosplenomegaly. Femoral and pedal pulses are 2+ and symmetric. He has 1+ edema on the right. Neurologic exam is nonfocal.  LABORATORY DATA: Laboratory data was reviewed from his recent hospital stay  Assessment / Plan: 1. Coronary disease status post anterior myocardial infarction treated with stenting of the LAD. He is asymptomatic. We will continue with aspirin, metoprolol, and statin therapy.  2. Severe COPD.  3. Morbid obesity.  4. Hyperlipidemia. Since he is too expensive I recommended he stop this and just take statin alone. The incremental benefit is probably marginal.  5. Chronic diastolic congestive heart failure. He appears to be well compensated on his current medical therapy.

## 2012-12-16 ENCOUNTER — Encounter: Payer: Self-pay | Admitting: Internal Medicine

## 2012-12-22 ENCOUNTER — Telehealth: Payer: Self-pay | Admitting: Cardiology

## 2012-12-22 NOTE — Telephone Encounter (Signed)
New problem   Pt want to know why Dr prescribe 30 pills a month for Metoprolol Tarta 50mg  and pt is having to take 2 pills per day. Please call wife concerning this matter

## 2012-12-23 MED ORDER — METOPROLOL TARTRATE 50 MG PO TABS: 50.0000 mg | ORAL_TABLET | Freq: Two times a day (BID) | ORAL | Status: DC

## 2012-12-23 NOTE — Telephone Encounter (Signed)
Spoke with patient's wife she stated patient takes metoprolol 50 mg twice a day and only 30 tablets sent to pharmacy.Wife stated she would like 90 day supply sent to [facility] at [address].Refill sent to pharmacy

## 2013-01-07 ENCOUNTER — Encounter: Payer: No Typology Code available for payment source | Admitting: Internal Medicine

## 2013-01-07 ENCOUNTER — Encounter: Payer: Self-pay | Admitting: Internal Medicine

## 2013-01-08 ENCOUNTER — Encounter: Payer: Self-pay | Admitting: Cardiology

## 2013-01-08 ENCOUNTER — Ambulatory Visit (INDEPENDENT_AMBULATORY_CARE_PROVIDER_SITE_OTHER): Payer: Medicare Other | Admitting: Cardiology

## 2013-01-08 ENCOUNTER — Encounter: Payer: Self-pay | Admitting: Internal Medicine

## 2013-01-08 VITALS — BP 118/68 | HR 65 | Ht 66.0 in | Wt 213.0 lb

## 2013-01-08 DIAGNOSIS — I441 Atrioventricular block, second degree: Secondary | ICD-10-CM

## 2013-01-08 DIAGNOSIS — Z950 Presence of cardiac pacemaker: Secondary | ICD-10-CM

## 2013-01-08 LAB — PACEMAKER DEVICE OBSERVATION
AL AMPLITUDE: 4.1 mv
BATTERY VOLTAGE: 2.96 V
DEVICE MODEL PM: 7228914
RV LEAD AMPLITUDE: 12 mv
RV LEAD THRESHOLD: 1.25 V
RV LEAD THRESHOLD: 1.5 V
RV LEAD THRESHOLD: 1.5 V

## 2013-01-08 NOTE — Progress Notes (Signed)
ELECTROPHYSIOLOGY OFFICE NOTE  Patient ID: [name] MRN: [id number], DOB/AGE: Sep 27, 1926   Date of Visit: 01/08/2013  Primary Physician: Delorse Lek, MD Primary Cardiologist / EP: [place], MD / Johney Frame, MD Reason for Visit: EP/device follow-up  History of Present Illness  [name] is a pleasant 79 year old man with Mobitz II AV block s/p PPM implant, CAD, HTN, DM, COPD and dementia who presents today for routine electrophysiology followup. He is accompanied by his wife. Since last being seen in our clinic, he reports he is doing well. He has no complaints. Today, he denies chest pain or shortness of breath. He denies palpitations, dizziness, near syncope or syncope. He denies LE swelling, orthopnea, PND or recent weight gain. [name] reports that he is compliant and tolerating medications without difficulty.  Past Medical History Past Medical History  Diagnosis Date  . Chest discomfort   . Coronary artery disease   . Diabetes mellitus   . Hypertension   . Hyperlipidemia   . Obesities, morbid   . Gallstone pancreatitis     recurrent  . TIA (transient ischemic attack)   . Bacteremia due to vancomycin resistant Enterococcus   . GERD (gastroesophageal reflux disease)   . Hearing impairment   . Myocardial infarction, anterior wall, subsequent care   . Second degree AV block, Mobitz type II     s/p PPM by JA 4/12  . ARF (acute renal failure)   . Cholangitis   . Duodenal ulcer   . GI bleed   . Anemia associated with acute blood loss   . 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 Dementia in Alzheimer's disease with delirium     Past Surgical History Past Surgical History  Procedure Laterality Date  . Cholecystectomy    . Pacemaker placement  01/01/11    implanted by JA for mobitz II AV block  . Pancreatic stent      Allergies/Intolerances Allergies  Allergen Reactions  . Doxycycline     Unknown- patient doesn't remember  .  Statins     Takes simvastatin at home  . Penicillins Rash   Current Home Medications Current Outpatient Prescriptions  Medication Sig Dispense Refill  . albuterol (PROVENTIL HFA;VENTOLIN HFA) 108 (90 BASE) MCG/ACT inhaler Inhale 2 puffs into the lungs every 4 (four) hours as needed. For shortness of breath      . aspirin 325 MG tablet Take 162.5 mg by mouth daily.       . budesonide-formoterol (SYMBICORT) 160-4.5 MCG/ACT inhaler Inhale 1 puff into the lungs daily.  1 Inhaler    . donepezil (ARICEPT) 5 MG tablet Take 5 mg by mouth at bedtime.      Marland Kitchen ezetimibe (ZETIA) 10 MG tablet Take 10 mg by mouth daily.       . folic acid (FOLVITE) 1 MG tablet Take 1 mg by mouth daily.      . hydrochlorothiazide (HYDRODIURIL) 25 MG tablet Take 12.5 mg by mouth daily.      Marland Kitchen ipratropium-albuterol (DUONEB) 0.5-2.5 (3) MG/3ML SOLN Take 3 mLs by nebulization every 6 (six) hours as needed. For shortness of breath      . losartan (COZAAR) 50 MG tablet Take 1 tablet (50 mg total) by mouth daily.  30 tablet  11  . MAGNESIUM SULFATE PO Take 1 tablet by mouth daily.      . metoprolol (LOPRESSOR) 50 MG tablet Take 1 tablet (50 mg total) by mouth 2 (two) times  daily.  180 tablet  3  . Multiple Vitamins-Minerals (CENTRUM SILVER PO) Take 1 tablet by mouth every morning.       . Multiple Vitamins-Minerals (ICAPS PO) Take 1 tablet by mouth daily.      . Omega-3 Fatty Acids (FISH OIL PO) Take 2 capsules by mouth daily.      Marland Kitchen omeprazole (PRILOSEC OTC) 20 MG tablet Take 20 mg by mouth 2 (two) times daily.      . polyethylene glycol (MIRALAX / GLYCOLAX) packet Take 17 g by mouth daily.      . simvastatin (ZOCOR) 20 MG tablet Take 20 mg by mouth daily.      Marland Kitchen Spacer/Aero-Holding Chambers (AEROCHAMBER PLUS FLO-VU MEDIUM) MISC 1 each by Other route once.      . tiotropium (SPIRIVA) 18 MCG inhalation capsule Place 18 mcg into inhaler and inhale daily.       No current facility-administered medications for this visit.    Social History Social History  . Marital Status: Married   Occupational History  . 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 Smoking status: Former Smoker -- 1.00 packs/day for 57 years    Types: Cigarettes    Quit date: 01/12/1996  . Smokeless tobacco: Never Used  . Alcohol Use: No  . Drug Use: No   Review of Systems General: No chills, fever, night sweats or weight changes Cardiovascular: No chest pain, dyspnea on exertion, edema, orthopnea, palpitations, paroxysmal nocturnal dyspnea Dermatological: No rash, lesions or masses Respiratory: No cough, dyspnea Urologic: No hematuria, dysuria Abdominal: No nausea, vomiting, diarrhea, bright red blood per rectum, melena, or hematemesis Neurologic: No visual changes, weakness, changes in mental status All other systems reviewed and are otherwise negative except as noted above.  Physical Exam Blood pressure 118/68, pulse 65, height 5\' 6"  (1.676 m), weight 213 lb (96.616 kg), SpO2 96.00%.  General: Well developed, well appearing 79 year old male in no acute distress. HEENT: Normocephalic, atraumatic. EOMs intact. Sclera nonicteric. Oropharynx clear.  Neck: Supple. No JVD. Lungs: Respirations regular and unlabored, CTA bilaterally. No wheezes, rales or rhonchi. Heart: RRR. S1, S2 present. No murmurs, rub, S3 or S4. Abdomen: Soft, non-distended.  Extremities: No clubbing, cyanosis or edema. PT/Radials 2+ and equal bilaterally. Psych: Normal affect. Neuro: Alert and oriented X 3. Moves all extremities spontaneously.   Diagnostics Device interrogation today - Normal device function. Thresholds, sensing, impedances consistent with previous measurements. Device programmed to maximize longevity. 1 mode switch, <1% of time, 10 seconds in duration, EGM consistent with atrial tachycardia. AT/AF burden 0%. No high ventricular rates noted. Device programmed at appropriate safety margins. Histogram distribution  appropriate for patient activity level. Device programmed to optimize intrinsic conduction. Estimated longevity 8.3 - 10 years.  Assessment and Plan 1. Mobitz II AV block s/p PPM implant Normal device function No programming changes made Continue routine remote device follow-up/checks every 3 months Return to clinic for follow-up with Dr. Johney Frame in one year 2. CAD Stable without anginal symptoms Continue medical therapy 3. HTN Stable; normotensive today Continue current antihypertensive regimen   Signed, Rick Duff, PA-C 01/08/2013, 3:09 PM

## 2013-01-08 NOTE — Patient Instructions (Addendum)
Remote monitoring is used to monitor your Pacemaker of ICD from home. This monitoring reduces the number of office visits required to check your device to one time per year. It allows [place] to keep an eye on the functioning of your device to ensure it is working properly. You are scheduled for a device check from home on 04/13/13. You may send your transmission at any time that day. If you have a wireless device, the transmission will be sent automatically. After your physician reviews your transmission, you will receive a postcard with your next transmission date.  Your physician wants you to follow-up in: 1 year You will receive a reminder letter in the mail two months in advance. If you don't receive a letter, please call our office to schedule the follow-up appointment.

## 2013-02-03 ENCOUNTER — Encounter: Payer: Self-pay | Admitting: Nurse Practitioner

## 2013-02-03 ENCOUNTER — Ambulatory Visit (INDEPENDENT_AMBULATORY_CARE_PROVIDER_SITE_OTHER): Payer: Medicare Other | Admitting: Nurse Practitioner

## 2013-02-03 VITALS — BP 118/67 | HR 96 | Ht 67.0 in | Wt 208.0 lb

## 2013-02-03 DIAGNOSIS — R413 Other amnesia: Secondary | ICD-10-CM

## 2013-02-03 DIAGNOSIS — F068 Other specified mental disorders due to known physiological condition: Secondary | ICD-10-CM | POA: Insufficient documentation

## 2013-02-03 MED ORDER — DONEPEZIL HCL 10 MG PO TABS: 10.0000 mg | ORAL_TABLET | Freq: Every day | ORAL | Status: DC

## 2013-02-03 NOTE — Progress Notes (Signed)
I reviewed note and agree with plan.   VIKRAM R. PENUMALLI, MD 02/03/2013, 5:54 PM Certified in Neurology, Neurophysiology and Neuroimaging  [facility] [address], [address] [place], [place] [address] [phone]  

## 2013-02-03 NOTE — Patient Instructions (Addendum)
Aricept 5 mg, 2 tabs daily until current prescription is used Will increase to 10 mg tablet daily  followup in 6-8 months

## 2013-02-03 NOTE — Progress Notes (Signed)
HPI: Mr. Poke returns for followup after her last visit with Dr. Marjory Lies 11/09/2011. Patient is very hard of hearing and does not contribute to the history taking. Wife answers most of the questions. He has a long history of hypertension, diabetes, hyperlipidemia and has had progressive memory loss for several years. He denies that he has any memory loss. He has continued to get lost while driving, having difficulty with shopping and apparently tells detailed stories to cover memory lapses. He continues to be independent with feeding, dressing and bathing himself. His wife does the cooking, cleaning and finances. He has a sixth grade education.  ROS:  Memory loss  Physical Exam General: well developed, well nourished, seated, in no evident distress Head: head normocephalic and atraumatic. Oropharynx benign Neck: supple with no carotid or supraclavicular bruits Cardiovascular: regular rate and rhythm, no murmurs  Neurologic Exam Mental Status: Awake and alert. MMSE 20/30,  missing item in orientation, calculation, and recall. AFT 1. Last MMSE 13/30.    Cranial Nerves:  Pupils equal, briskly reactive to light. Extraocular movements full without nystagmus. Visual fields full to confrontation. Very hard of hearing  Facial sensation intact. Face, tongue, palate move normally and symmetrically. Neck flexion and extension normal.  Motor: Normal bulk and tone. Normal strength in all tested extremity muscles. No focal weakness Coordination: Rapid alternating movements normal in all extremities. Finger-to-nose  performed accurately bilaterally. Gait and Station: Arises from chair without difficulty. Stance is narrow, walks with a limp, no assistive device  Reflexes: 1+ and symmetric except absent at the ankles. Toes downgoing.     ASSESSMENT: Short-term memory loss, 6th grade education. Currently on Aricept 5 and never increased to 10mg      PLAN: Aricept 5 mg, 2 tabs daily until current  prescription is used Will increase to 10 mg tablet daily, will call in.  Patient should not be driving and wife agrees. He can always be retested at the [facility]  Followup in 6-8 months  Nilda Riggs, [facility] APRN

## 2013-02-17 IMAGING — CT CT HEAD WO/W CM
1 of 2 series · 13 of 30 positions shown, 17 images · IV contrast (75CC OMNI 300)
Comparison: 10/27/2010 MR.  09/25/2009 CT.

CLINICAL DATA: Left facial droop.  Slurred speech.  Vertigo.
Dementia.  Diabetic.

CT HEAD WITHOUT AND WITH CONTRAST
TECHNIQUE: Contiguous axial images were obtained from the base of
the skull through the vertex without and with intravenous contrast.
Contrast: 75 ml [id number].

[Series 2: head w/o · axial · non-contrast · 0.49mm/px · z∈[+64,+193]mm · 13 of 28 slices shown, 17 images]
[im 2/28  brain]
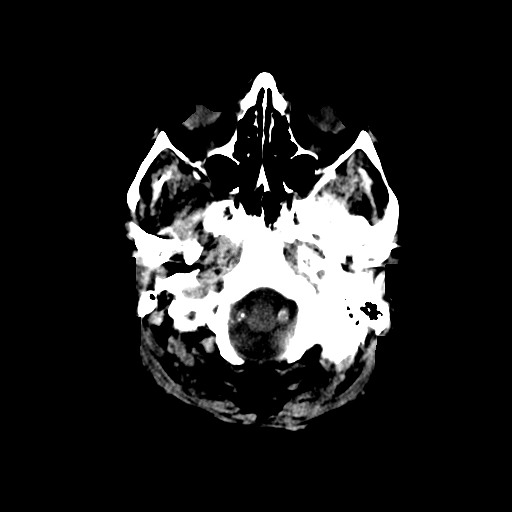
[im 2/28  bone]
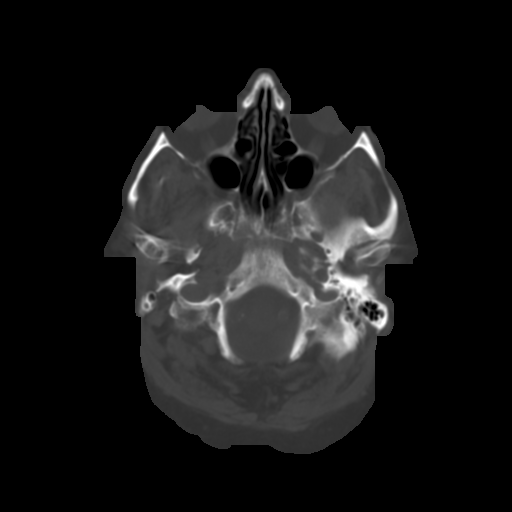
[im 4/28  brain]
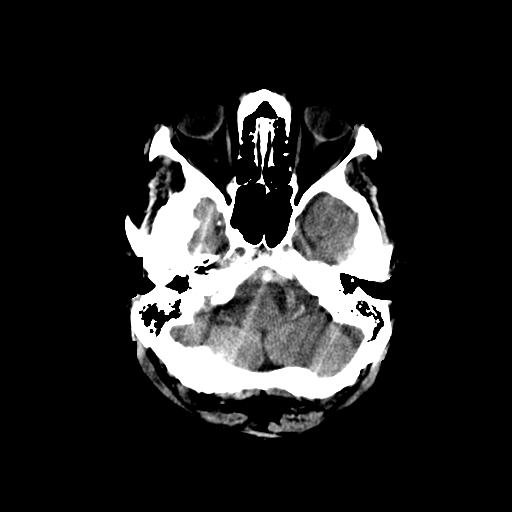
[im 6/28  brain]
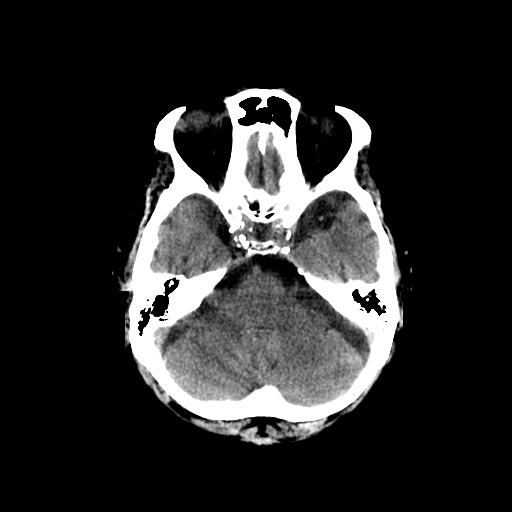
[im 8/28  brain]
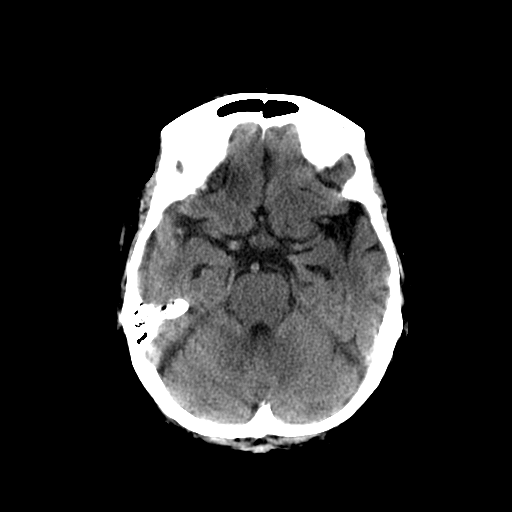
[im 10/28  brain]
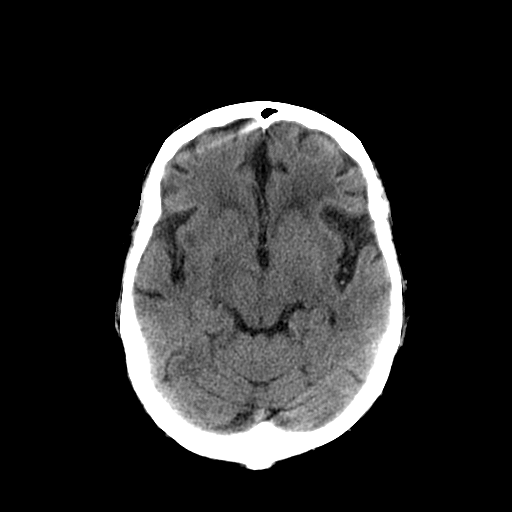
[im 10/28  bone]
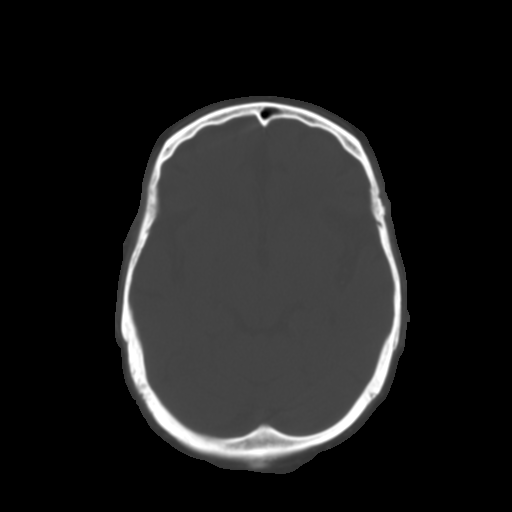
[im 12/28  brain]
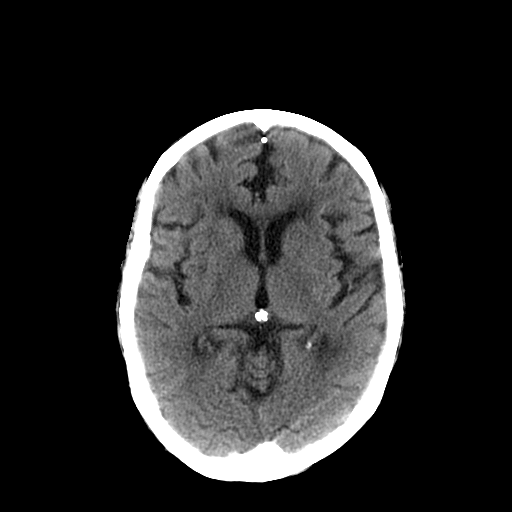
[im 14/28  brain]
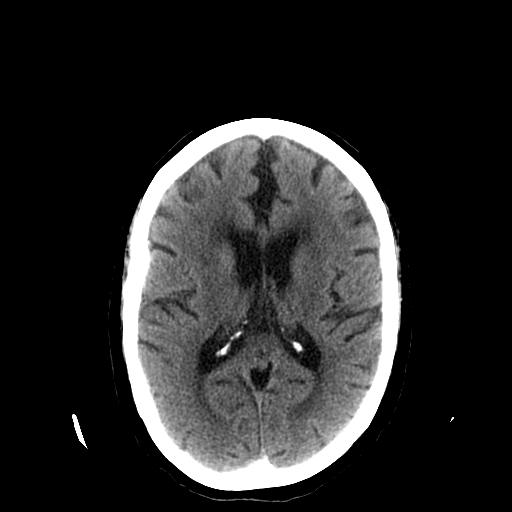
[im 16/28  brain]
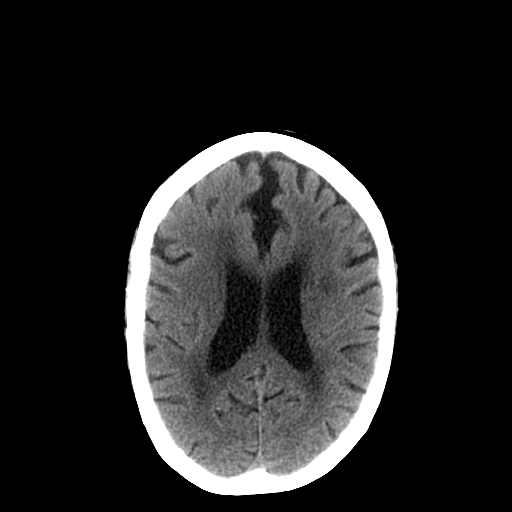
[im 18/28  brain]
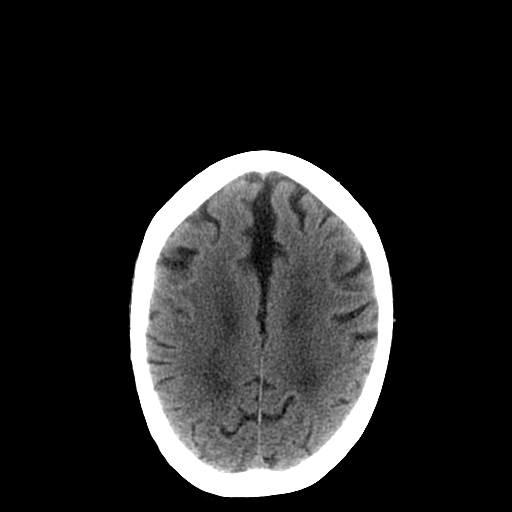
[im 18/28  bone]
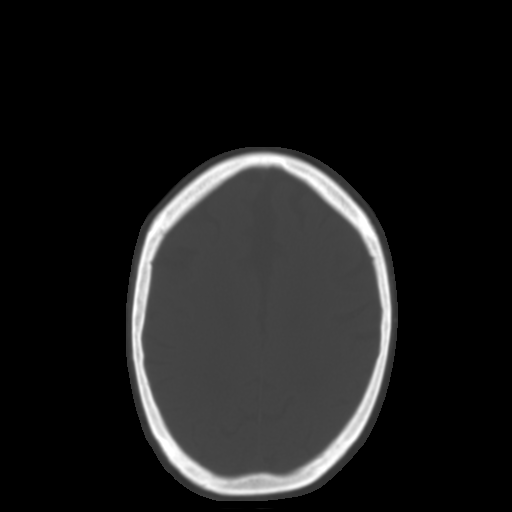
[im 20/28  brain]
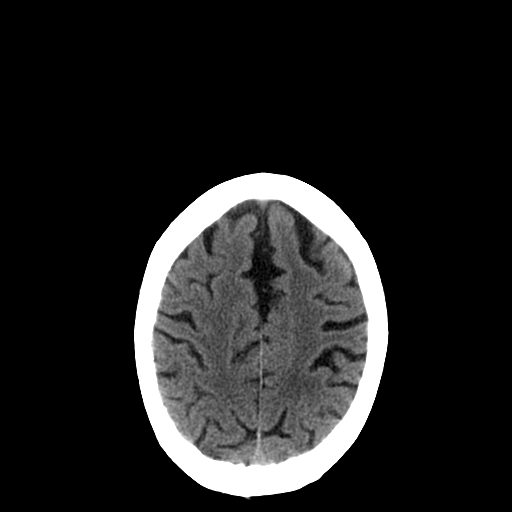
[im 22/28  brain]
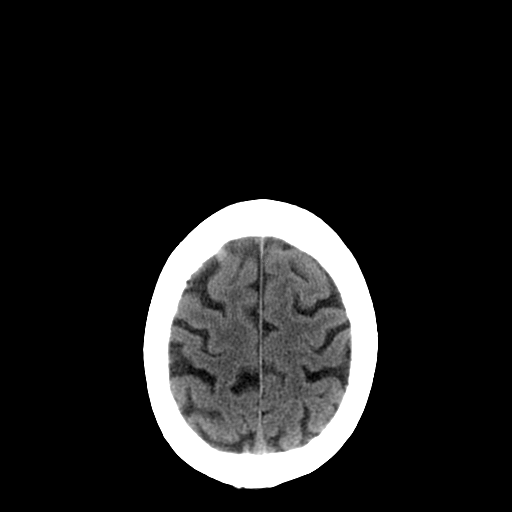
[im 24/28  brain]
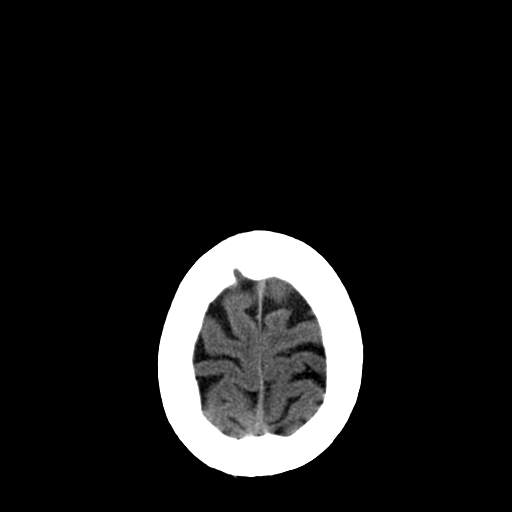
[im 26/28  brain]
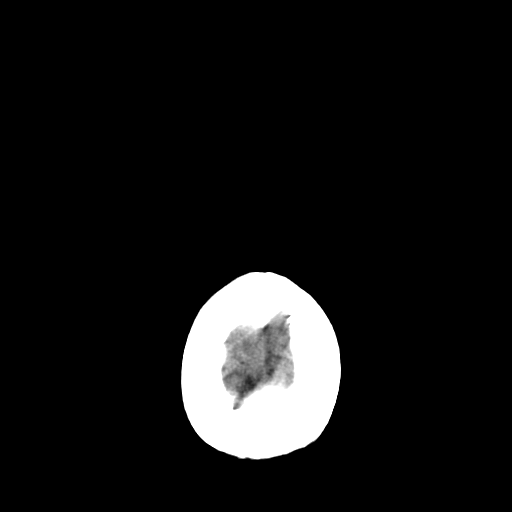
[im 26/28  bone]
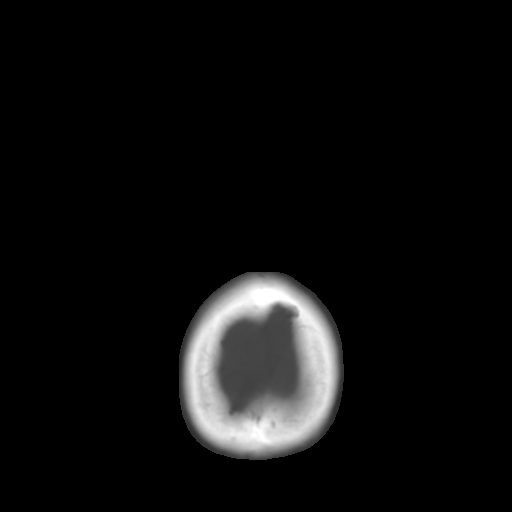

[13 of 30 positions shown; findings below may reference images not displayed]

FINDINGS: No intracranial hemorrhage.

Prominent small vessel disease type changes.  Remote infarct
central pons. No CT evidence of large acute infarct.  Small acute
infarct cannot be excluded by CT.

No intracranial mass lesions/abnormal enhancement.

Global atrophy without hydrocephalus.

Vascular calcifications.

Mild exophthalmos.
IMPRESSION: Prominent small vessel disease type changes and remote central pons
infarct without CT evidence of large acute infarct, intracranial
hemorrhage or intracranial mass.

## 2013-02-26 ENCOUNTER — Encounter: Payer: Self-pay | Admitting: Pulmonary Disease

## 2013-02-26 ENCOUNTER — Ambulatory Visit (INDEPENDENT_AMBULATORY_CARE_PROVIDER_SITE_OTHER): Payer: Medicare Other | Admitting: Pulmonary Disease

## 2013-02-26 VITALS — BP 122/72 | HR 76 | Temp 97.7°F | Ht 67.0 in | Wt 211.0 lb

## 2013-02-26 DIAGNOSIS — J441 Chronic obstructive pulmonary disease with (acute) exacerbation: Secondary | ICD-10-CM | POA: Insufficient documentation

## 2013-02-26 MED ORDER — BUDESONIDE-FORMOTEROL FUMARATE 160-4.5 MCG/ACT IN AERO: 1.0000 | INHALATION_SPRAY | Freq: Two times a day (BID) | RESPIRATORY_TRACT | Status: DC

## 2013-02-26 MED ORDER — PREDNISONE 5 MG PO TABS: ORAL_TABLET | ORAL | Status: DC

## 2013-02-26 NOTE — Patient Instructions (Signed)
Prednisone 5 mg pill >> 4 pills for 2 days, 3 pills for 2 days, 2 pills for 2 days, 1 pill for 2 days Symbicort 1 puff twice per day Continue spiriva one puff daily Call if not feeling better Follow up in 4 months

## 2013-02-26 NOTE — Progress Notes (Signed)
Chief Complaint  Patient presents with  . COPD    States that his breathing is not doing well today. Reports coughing. Denies SOB, chest tightness. He is wheezing while vitals are being taken.    History of Present Illness: [name] is a 79 y.o. male former smoker with Severe COPD/emphysema.  He is accompanied by his wife.  He has been getting more trouble with his breathing.  He feels tight in his chest.  He is coughing up clear sputum.  He denies fever, sore throat, or sinus congestion.  He denies hemoptysis. He is not having leg swelling.  He is using spiriva once daily.  He has not been using symbicort.  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  [name]  has past surgical history that includes Cholecystectomy; pacemaker placement (01/01/11); and pancreatic stent.  Prior to Admission medications   Medication Sig Start Date End Date Taking? Authorizing Provider  albuterol (PROVENTIL HFA;VENTOLIN HFA) 108 (90 BASE) MCG/ACT inhaler Inhale 2 puffs into the lungs every 4 (four) hours as needed. For shortness of breath   Yes  Historical Provider, MD  aspirin 325 MG tablet Take 162.5 mg by mouth daily.    Yes Historical Provider, MD  budesonide-formoterol (SYMBICORT) 160-4.5 MCG/ACT inhaler Inhale 1 puff into the lungs 2 (two) times daily.   Yes Historical Provider, MD  donepezil (ARICEPT) 5 MG tablet Take 5 mg by mouth at bedtime.   Yes Historical Provider, MD  ezetimibe (ZETIA) 10 MG tablet Take 10 mg by mouth daily.    Yes Historical Provider, MD  folic acid (FOLVITE) 1 MG tablet Take 1 mg by mouth daily.   Yes Historical Provider, MD  hydrochlorothiazide (HYDRODIURIL) 25 MG tablet Take 12.5 mg by mouth daily.   Yes Historical Provider, MD  ipratropium-albuterol (DUONEB) 0.5-2.5 (3) MG/3ML SOLN Take 3 mLs by nebulization every 6 (six) hours as needed. For shortness of breath   Yes Historical Provider, MD  losartan (COZAAR) 50 MG tablet Take 1 tablet (50 mg total) by mouth daily. 11/04/12  Yes Jeanella Craze, NP  MAGNESIUM SULFATE PO Take 1 tablet by mouth daily.   Yes Historical Provider, MD  metoprolol (LOPRESSOR) 50 MG tablet Take 50 mg by mouth 2 (two) times daily.   Yes Historical Provider, MD  Multiple Vitamins-Minerals (CENTRUM SILVER PO) Take 1 tablet by mouth every morning.    Yes Historical Provider, MD  Multiple Vitamins-Minerals (ICAPS PO) Take 1 tablet by mouth daily.   Yes Historical Provider, MD  nitroGLYCERIN (NITROSTAT) 0.4 MG SL tablet Place 0.4 mg under the tongue every 5 (five) minutes as needed. For chest pain   Yes Historical Provider, MD  Omega-3 Fatty Acids (FISH OIL PO) Take 2 capsules by mouth daily.  Yes Historical Provider, MD  omeprazole (PRILOSEC OTC) 20 MG tablet Take 20 mg by mouth 2 (two) times daily.   Yes Historical Provider, MD  polyethylene glycol (MIRALAX / GLYCOLAX) packet Take 17 g by mouth daily.   Yes Historical Provider, MD  predniSONE (DELTASONE) 10 MG tablet 3 tabs daily for 4 days, then 2 tabs daily for 4 days, then 1 tab daily for 4 days then stop 11/04/12  Yes Jeanella Craze, NP  simvastatin (ZOCOR) 20 MG tablet Take 20 mg by mouth daily.   Yes Historical Provider, MD  Spacer/Aero-Holding Chambers (AEROCHAMBER PLUS FLO-VU MEDIUM) MISC 1 each by Other route once. 11/04/12  Yes Jeanella Craze, NP  tiotropium (SPIRIVA) 18 MCG inhalation capsule Place 18 mcg into inhaler and inhale daily.   Yes Historical Provider, MD    Allergies  Allergen Reactions  . Doxycycline     Unknown- patient doesn't remember  . Statins     Takes simvastatin at home  . Penicillins Rash     Physical Exam:  General - No distress ENT - No sinus tenderness, no oral exudate, no LAN Cardiac - s1s2 regular, no murmur Chest - Decreased breath sounds, faint b/l expiratory wheeze Back - No focal tenderness Abd - Soft, non-tender Ext - No edema Neuro - Normal strength, poor hearing Skin - No rashes Psych - normal mood, and behavior   Assessment/Plan:  Coralyn Helling, MD [facility] Pager:  [phone] 02/26/2013, 2:35 PM

## 2013-02-26 NOTE — Assessment & Plan Note (Signed)
Will give prednisone taper and resume symbicort.  He is to continue spiriva and prn albuterol.  I don't think he needs antibiotics at present.  He is to call for further advice if his symptoms do not improve.

## 2013-04-13 ENCOUNTER — Encounter: Payer: Medicare Other | Admitting: *Deleted

## 2013-04-14 ENCOUNTER — Encounter: Payer: Self-pay | Admitting: *Deleted

## 2013-04-20 ENCOUNTER — Ambulatory Visit (INDEPENDENT_AMBULATORY_CARE_PROVIDER_SITE_OTHER): Payer: Medicare Other | Admitting: *Deleted

## 2013-04-20 DIAGNOSIS — Z950 Presence of cardiac pacemaker: Secondary | ICD-10-CM

## 2013-04-20 DIAGNOSIS — I441 Atrioventricular block, second degree: Secondary | ICD-10-CM

## 2013-04-21 LAB — REMOTE PACEMAKER DEVICE
AL IMPEDENCE PM: 390 Ohm
ATRIAL PACING PM: 9.9
BAMS-0001: 180 {beats}/min
BAMS-0003: 70 {beats}/min
BATTERY VOLTAGE: 2.95 V
VENTRICULAR PACING PM: 1

## 2013-05-06 ENCOUNTER — Encounter: Payer: Self-pay | Admitting: *Deleted

## 2013-05-11 ENCOUNTER — Encounter: Payer: Self-pay | Admitting: Internal Medicine

## 2013-05-28 ENCOUNTER — Ambulatory Visit (INDEPENDENT_AMBULATORY_CARE_PROVIDER_SITE_OTHER): Payer: Medicare Other | Admitting: Cardiology

## 2013-05-28 ENCOUNTER — Encounter: Payer: Self-pay | Admitting: Cardiology

## 2013-05-28 VITALS — BP 124/66 | HR 79 | Ht 67.0 in | Wt 208.8 lb

## 2013-05-28 DIAGNOSIS — J439 Emphysema, unspecified: Secondary | ICD-10-CM

## 2013-05-28 DIAGNOSIS — I2109 ST elevation (STEMI) myocardial infarction involving other coronary artery of anterior wall: Secondary | ICD-10-CM

## 2013-05-28 DIAGNOSIS — J438 Other emphysema: Secondary | ICD-10-CM

## 2013-05-28 DIAGNOSIS — I509 Heart failure, unspecified: Secondary | ICD-10-CM

## 2013-05-28 DIAGNOSIS — I441 Atrioventricular block, second degree: Secondary | ICD-10-CM

## 2013-05-28 DIAGNOSIS — I5022 Chronic systolic (congestive) heart failure: Secondary | ICD-10-CM

## 2013-05-28 NOTE — Patient Instructions (Signed)
Continue your current therapy  I will see you in 6 months.   

## 2013-05-28 NOTE — Progress Notes (Signed)
[name] Date of Birth: February 24, 1927   History of Present Illness: [name] is seen for followup today. He is status post acute anterior myocardial infarction in April of 2012 treated with direct stenting of the mid LAD. He also had second-degree heart block with significant bradycardia that was symptomatic and he underwent a permanent pacemaker. He is seen today in follow up with his wife. He is extremely hard of hearing. He still has some shortness of breath. He was treated with a steroid taper in June. His wife reports he wheezes all the time but he doesn't complain of being out of breath. He denies any chest pain. Pacemaker check in July was satisfactory.  Current Outpatient Prescriptions on File Prior to Visit  Medication Sig Dispense Refill  . albuterol (PROVENTIL HFA;VENTOLIN HFA) 108 (90 BASE) MCG/ACT inhaler Inhale 2 puffs into the lungs every 4 (four) hours as needed. For shortness of breath      . aspirin 325 MG tablet Take 162.5 mg by mouth daily.       . budesonide-formoterol (SYMBICORT) 160-4.5 MCG/ACT inhaler Inhale 1 puff into the lungs 2 (two) times daily.  1 Inhaler  0  . donepezil (ARICEPT) 10 MG tablet Take 1 tablet (10 mg total) by mouth daily at 8 pm.  30 tablet  6  . folic acid (FOLVITE) 1 MG tablet Take 1 mg by mouth daily.      . hydrochlorothiazide (HYDRODIURIL) 25 MG tablet Take 12.5 mg by mouth daily.      Marland Kitchen ipratropium-albuterol (DUONEB) 0.5-2.5 (3) MG/3ML SOLN Take 3 mLs by nebulization every 6 (six) hours as needed. For shortness of breath      . losartan (COZAAR) 50 MG tablet Take 1 tablet (50 mg total) by mouth daily.  30 tablet  11  . MAGNESIUM SULFATE PO Take 1 tablet by mouth daily.      . metoprolol (LOPRESSOR) 50 MG tablet Take 1 tablet (50 mg total) by mouth 2 (two) times daily.  180 tablet  3  . Multiple Vitamins-Minerals (CENTRUM SILVER PO) Take 1 tablet by mouth every morning.       . Omega-3 Fatty Acids (FISH OIL PO) Take 2 capsules by mouth  daily.      Marland Kitchen omeprazole (PRILOSEC OTC) 20 MG tablet Take 20 mg by mouth 2 (two) times daily.      . polyethylene glycol (MIRALAX / GLYCOLAX) packet Take 17 g by mouth daily.      . predniSONE (DELTASONE) 5 MG tablet 4 pills for 2 days, 3 pills for 2 days, 2 pills for 2 days, 1 pill for 2 days  20 tablet  0  . simvastatin (ZOCOR) 20 MG tablet Take 20 mg by mouth daily.      Marland Kitchen tiotropium (SPIRIVA) 18 MCG inhalation capsule Place 18 mcg into inhaler and inhale daily.       No current facility-administered medications on file prior to visit.    Allergies  Allergen Reactions  . Doxycycline     Unknown- patient doesn't remember  . Statins     Takes simvastatin at home  . Penicillins Rash    Past Medical History  Diagnosis Date  . Chest discomfort   . Coronary artery disease   . Diabetes mellitus   . Hypertension   . Hyperlipidemia   . Obesities, morbid   . Gallstone pancreatitis     recurrent  . TIA (transient ischemic attack)   . Bacteremia due to vancomycin resistant  Enterococcus   . GERD (gastroesophageal reflux disease)   . Hearing impairment   . Myocardial infarction, anterior wall, subsequent care   . Second degree AV block, Mobitz type II     s/p PPM by [facility] 4/12  . ARF (acute renal failure)   . Cholangitis   . Duodenal ulcer   . GI bleed   . Anemia associated with acute blood loss   . 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 Dementia in Alzheimer's disease with delirium     Past Surgical History  Procedure Laterality Date  . Cholecystectomy    . Pacemaker placement  01/01/11    implanted by [facility] for mobitz II AV block  . Pancreatic stent      History  Smoking status  . Former Smoker -- 1.00 packs/day for 57 years  . Types: Cigarettes  . Quit date: 01/12/1996  Smokeless tobacco  . Never Used    History  Alcohol Use No    History reviewed. No pertinent family history.  Review of Systems: The review of systems is positive  for generalized fatigue. He is very hard of hearing. He reports his vision is worse. All other systems were reviewed and are negative.  Physical Exam: BP 124/66  Pulse 79  Ht 5\' 7"  (1.702 m)  Wt 208 lb 12.8 oz (94.711 kg)  BMI 32.69 kg/m2  SpO2 96% He is an elderly overweight white male who is very hard of hearing. He is in no distress. HEENT exam reveals he is wearing hearing aid. His pupils are equal round and reactive. Sclera are clear. Oropharynx is clear. Neck is without JVD, adenopathy, or bruits. Lungs are clear with diminished breath sounds throughout with expiratory wheezes. Cardiac exam reveals a regular rate and rhythm without gallop, murmur, or click. His pacemaker site is normal. Abdomen is soft and nontender without masses or hepatosplenomegaly. Femoral and pedal pulses are 2+ and symmetric. He has trace edema on the right. Neurologic exam is nonfocal.  LABORATORY DATA:   Assessment / Plan: 1. Coronary disease status post anterior myocardial infarction treated with stenting of the LAD. He is asymptomatic. We will continue with aspirin, metoprolol, and statin therapy.  2. Severe COPD.  3. Morbid obesity. He has lost 3 pounds since his last visit.  4. Hyperlipidemia. Continue statin therapy.  5. Chronic diastolic congestive heart failure. He appears to be well compensated on his current medical therapy.

## 2013-07-20 ENCOUNTER — Encounter: Payer: Self-pay | Admitting: Internal Medicine

## 2013-07-20 ENCOUNTER — Ambulatory Visit (INDEPENDENT_AMBULATORY_CARE_PROVIDER_SITE_OTHER): Payer: Medicare Other | Admitting: *Deleted

## 2013-07-20 DIAGNOSIS — I441 Atrioventricular block, second degree: Secondary | ICD-10-CM

## 2013-07-20 LAB — REMOTE PACEMAKER DEVICE
AL AMPLITUDE: 4 mv
AL THRESHOLD: 0.625 V
DEVICE MODEL PM: 7228914
RV LEAD AMPLITUDE: 12 mv
RV LEAD IMPEDENCE PM: 560 Ohm
RV LEAD THRESHOLD: 2.125 V
VENTRICULAR PACING PM: 1

## 2013-07-23 ENCOUNTER — Encounter: Payer: Self-pay | Admitting: *Deleted

## 2013-07-30 NOTE — Progress Notes (Signed)
Remote pacer received  

## 2013-08-06 ENCOUNTER — Ambulatory Visit (INDEPENDENT_AMBULATORY_CARE_PROVIDER_SITE_OTHER): Payer: Medicare Other | Admitting: Nurse Practitioner

## 2013-08-06 ENCOUNTER — Encounter: Payer: Self-pay | Admitting: Nurse Practitioner

## 2013-08-06 VITALS — BP 111/74 | HR 76 | Ht 67.5 in | Wt 210.0 lb

## 2013-08-06 DIAGNOSIS — F0391 Unspecified dementia with behavioral disturbance: Secondary | ICD-10-CM

## 2013-08-06 DIAGNOSIS — F03918 Unspecified dementia, unspecified severity, with other behavioral disturbance: Secondary | ICD-10-CM

## 2013-08-06 DIAGNOSIS — R413 Other amnesia: Secondary | ICD-10-CM

## 2013-08-06 MED ORDER — DONEPEZIL HCL 10 MG PO TABS: 10.0000 mg | ORAL_TABLET | Freq: Every day | ORAL | Status: DC

## 2013-08-06 MED ORDER — MEMANTINE HCL ER 28 MG PO CP24: 28.0000 mg | ORAL_CAPSULE | Freq: Every day | ORAL | Status: DC

## 2013-08-06 NOTE — Progress Notes (Signed)
[facility]  PATIENT: [name] DOB: 03-04-1927   REASON FOR VISIT:follow up for memory loss    HISTORY OF PRESENT ILLNESS: [name], 79 year old white male returns for followup. He has a history of memory loss. Patient is very hard of hearing and does not contribute to the history taking. Wife answers most of the questions. He has a long history of hypertension, diabetes, hyperlipidemia and has had progressive memory loss for several years. He denies that he has any memory loss. He has continued to get lost while driving, having difficulty with shopping and apparently tells detailed stories to cover memory lapses. He continues to be independent with feeding, dressing and bathing himself. His wife does the cooking, cleaning and finances.He can mow the grass otherwise he is not active.  He has a sixth grade education    REVIEW OF SYSTEMS: Full 14 system review of systems performed and notable only for:  Constitutional: Weight gain Cardiovascular: N/A  Ear/Nose/Throat: Hearing loss  Skin: N/A  Eyes: Blurred vision Respiratory: Shortness of breath, wheezing  Gastroitestinal: N/A  Hematology/Lymphatic: N/A  Endocrine: N/A Musculoskeletal:N/A  Allergy/Immunology: Allergies Neurological  memory Psychiatric: N/A   ALLERGIES: Allergies  Allergen Reactions  . Doxycycline     Unknown- patient doesn't remember  . Statins     Takes simvastatin at home  . Penicillins Rash    HOME MEDICATIONS: Outpatient Prescriptions Prior to Visit  Medication Sig Dispense Refill  . albuterol (PROVENTIL HFA;VENTOLIN HFA) 108 (90 BASE) MCG/ACT inhaler Inhale 2 puffs into the lungs every 4 (four) hours as needed. For shortness of breath      . aspirin 325 MG tablet Take 162.5 mg by mouth daily.       . budesonide-formoterol (SYMBICORT) 160-4.5 MCG/ACT inhaler Inhale 1 puff into the lungs 2 (two) times daily.  1 Inhaler  0  . donepezil (ARICEPT) 10 MG tablet Take 1 tablet  (10 mg total) by mouth daily at 8 pm.  30 tablet  6  . folic acid (FOLVITE) 1 MG tablet Take 1 mg by mouth daily.      . hydrochlorothiazide (HYDRODIURIL) 25 MG tablet Take 12.5 mg by mouth daily.      Marland Kitchen ipratropium-albuterol (DUONEB) 0.5-2.5 (3) MG/3ML SOLN Take 3 mLs by nebulization every 6 (six) hours as needed. For shortness of breath      . losartan (COZAAR) 50 MG tablet Take 1 tablet (50 mg total) by mouth daily.  30 tablet  11  . MAGNESIUM SULFATE PO Take 1 tablet by mouth daily.      . metoprolol (LOPRESSOR) 50 MG tablet Take 1 tablet (50 mg total) by mouth 2 (two) times daily.  180 tablet  3  . Multiple Vitamins-Minerals (CENTRUM SILVER PO) Take 1 tablet by mouth every morning.       . Omega-3 Fatty Acids (FISH OIL PO) Take 2 capsules by mouth daily.      Marland Kitchen omeprazole (PRILOSEC OTC) 20 MG tablet Take 20 mg by mouth 2 (two) times daily.      Marland Kitchen omeprazole (PRILOSEC) 20 MG capsule       . polyethylene glycol (MIRALAX / GLYCOLAX) packet Take 17 g by mouth daily.      . simvastatin (ZOCOR) 20 MG tablet Take 20 mg by mouth daily.      Marland Kitchen tiotropium (SPIRIVA) 18 MCG inhalation capsule Place 18 mcg into inhaler and inhale daily.      . predniSONE (DELTASONE) 5 MG tablet 4 pills  for 2 days, 3 pills for 2 days, 2 pills for 2 days, 1 pill for 2 days  20 tablet  0   No facility-administered medications prior to visit.    PAST MEDICAL HISTORY: Past Medical History  Diagnosis Date  . Chest discomfort   . Coronary artery disease   . Diabetes mellitus   . Hypertension   . Hyperlipidemia   . Obesities, morbid   . Gallstone pancreatitis     recurrent  . TIA (transient ischemic attack)   . Bacteremia due to vancomycin resistant Enterococcus   . GERD (gastroesophageal reflux disease)   . Hearing impairment   . Myocardial infarction, anterior wall, subsequent care   . Second degree AV block, Mobitz type II     s/p PPM by [facility] 4/12  . ARF (acute renal failure)   . Cholangitis   . Duodenal  ulcer   . GI bleed   . Anemia associated with acute blood loss   . 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 Dementia in Alzheimer's disease with delirium     PAST SURGICAL HISTORY: Past Surgical History  Procedure Laterality Date  . Cholecystectomy    . Pacemaker placement  01/01/11    implanted by [facility] for mobitz II AV block  . Pancreatic stent      FAMILY HISTORY: History reviewed. No pertinent family history.  SOCIAL HISTORY: History   Social History  . Marital Status: Married    Spouse Name: [place] Lions    Number of Children: 1  . Years of Education: 6   Occupational History  . 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 Smoking status: Former Smoker -- 1.00 packs/day for 57 years    Types: Cigarettes    Quit date: 01/12/1996  . Smokeless tobacco: Never Used  . Alcohol Use: No  . Drug Use: No  . Sexual Activity: Not on file   Other Topics Concern  . Not on file   Social History Narrative   Patient is married [place] Lions) and lives with his wife.   Patient has one child.   Patient is retired.   Patient has a 6th grade education.   Patient is right-handed.   Patient drinks caffeine daily.     PHYSICAL EXAM  Filed Vitals:   08/06/13 1037  BP: 111/74  Pulse: 76  Height: 5' 7.5" (1.715 m)  Weight: 210 lb (95.255 kg)   Body mass index is 32.39 kg/(m^2).  Generalized: Well developed, obese male in no acute distress  Neurological examination   Mentation: Alert MMSE 17/30 missing items in orientation, calculation, 3 of 3 recall unable to repeat phrases and unable to copy a figure Follows all commands  Cranial nerve II-XII: Pupils were equal round reactive to light extraocular movements were full, visual field were full on confrontational test. Facial sensation and strength were normal. hearing was intact to finger rubbing bilaterally. Uvula tongue midline. head turning and shoulder shrug and were normal and  symmetric.Tongue protrusion into cheek strength was normal. Motor: normal bulk and tone, full strength in the BUE, BLE, No focal weakness Coordination: finger-nose-finger, no dysmetria Reflexes: 1+ upper and lower except absent at ankles Gait and Station: Rising up from seated position with assistance, narrow  stance, walks with a limp, no assistive device.   DIAGNOSTIC DATA (LABS, IMAGING, TESTING) - I reviewed patient records, labs, notes, testing and imaging myself where available.  Lab Results  Component Value Date   WBC 10.5 11/03/2012   HGB 13.1 11/03/2012   HCT 38.7* 11/03/2012   MCV 92.4 11/03/2012   PLT 267 11/03/2012      Component Value Date/Time   NA 137 11/03/2012 0625   K 3.7 11/03/2012 0625   CL 99 11/03/2012 0625   CO2 25 11/03/2012 0625   GLUCOSE 119* 11/03/2012 0625   BUN 28* 11/03/2012 0625   CREATININE 1.63* 11/04/2012 0505   CALCIUM 9.1 11/03/2012 0625   PROT 5.6* 11/03/2012 0625   ALBUMIN 2.8* 11/03/2012 0625   AST 17 11/03/2012 0625   ALT 12 11/03/2012 0625   ALKPHOS 72 11/03/2012 0625   BILITOT 0.3 11/03/2012 0625   GFRNONAA 37* 11/04/2012 0505   GFRAA 43* 11/04/2012 0505     ASSESSMENT AND PLAN  79 y.o. year old male  has a past medical history of  Dementia in Alzheimer's disease with delirium. here to followup. Rare hallucinations. Mini-Mental Status exam 17/30. Currently on Aricept 10mg   without side effects  Patient will continue Aricept 10 mg daily, refilled  Start Namenda starter pack Given 30 day trial offer for Namenda for one month Followup in 6 months, next visit with Dr. Tharon Aquas, [facility], [facility], APRN  [facility] [address] [place], [place] [address] [phone]

## 2013-08-06 NOTE — Patient Instructions (Signed)
Patient will continue Aricept 10 mg daily Start Namenda starter pack Given 30 day trial offer for Namenda for one month Followup in 6 months, next visit with Dr. Marjory Lies

## 2013-08-07 ENCOUNTER — Ambulatory Visit (INDEPENDENT_AMBULATORY_CARE_PROVIDER_SITE_OTHER): Payer: Medicare Other | Admitting: Pulmonary Disease

## 2013-08-07 ENCOUNTER — Encounter: Payer: Self-pay | Admitting: Pulmonary Disease

## 2013-08-07 VITALS — BP 132/82 | HR 72 | Wt 211.0 lb

## 2013-08-07 DIAGNOSIS — J438 Other emphysema: Secondary | ICD-10-CM

## 2013-08-07 DIAGNOSIS — J439 Emphysema, unspecified: Secondary | ICD-10-CM

## 2013-08-07 NOTE — Progress Notes (Signed)
Chief Complaint  Patient presents with  . COPD    Breathing is unchanged. Reports coughing with clear/white mucus production.     History of Present Illness: [name] is a 79 y.o. male former smoker with severe COPD/emphysema.  He has been doing better since being on spiriva and symbicort.  He has occasional cough with clear to yellow sputum.  He is not having wheeze, hemoptysis, chest tightness, or leg swelling.  He is not very active.  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  [name]  has past surgical history that includes Cholecystectomy; pacemaker placement (01/01/11); and pancreatic stent.  Prior to Admission medications   Medication Sig Start Date End Date Taking? Authorizing Provider  albuterol (PROVENTIL HFA;VENTOLIN HFA) 108 (90 BASE) MCG/ACT inhaler Inhale 2 puffs into the lungs every 4 (four) hours as needed. For shortness of breath   Yes Historical Provider, MD  aspirin 325 MG tablet Take 162.5 mg by mouth daily.    Yes Historical Provider, MD  budesonide-formoterol (SYMBICORT) 160-4.5 MCG/ACT inhaler  Inhale 1 puff into the lungs 2 (two) times daily.   Yes Historical Provider, MD  donepezil (ARICEPT) 5 MG tablet Take 5 mg by mouth at bedtime.   Yes Historical Provider, MD  ezetimibe (ZETIA) 10 MG tablet Take 10 mg by mouth daily.    Yes Historical Provider, MD  folic acid (FOLVITE) 1 MG tablet Take 1 mg by mouth daily.   Yes Historical Provider, MD  hydrochlorothiazide (HYDRODIURIL) 25 MG tablet Take 12.5 mg by mouth daily.   Yes Historical Provider, MD  ipratropium-albuterol (DUONEB) 0.5-2.5 (3) MG/3ML SOLN Take 3 mLs by nebulization every 6 (six) hours as needed. For shortness of breath   Yes Historical Provider, MD  losartan (COZAAR) 50 MG tablet Take 1 tablet (50 mg total) by mouth daily. 11/04/12  Yes Jeanella Craze, NP  MAGNESIUM SULFATE PO Take 1 tablet by mouth daily.   Yes Historical Provider, MD  metoprolol (LOPRESSOR) 50 MG tablet Take 50 mg by mouth 2 (two) times daily.   Yes Historical Provider, MD  Multiple Vitamins-Minerals (CENTRUM SILVER PO) Take 1 tablet by mouth every morning.    Yes Historical Provider, MD  Multiple Vitamins-Minerals (ICAPS PO) Take 1 tablet by mouth daily.   Yes Historical Provider, MD  nitroGLYCERIN (NITROSTAT) 0.4 MG SL tablet Place 0.4 mg under the tongue every 5 (five) minutes as needed. For chest pain   Yes Historical Provider, MD  Omega-3 Fatty Acids (FISH OIL PO) Take 2 capsules by mouth daily.   Yes Historical Provider, MD  omeprazole (PRILOSEC OTC) 20 MG tablet Take 20 mg by mouth 2 (two) times daily.   Yes Historical Provider, MD  polyethylene glycol (MIRALAX / GLYCOLAX) packet Take 17  g by mouth daily.   Yes Historical Provider, MD  predniSONE (DELTASONE) 10 MG tablet 3 tabs daily for 4 days, then 2 tabs daily for 4 days, then 1 tab daily for 4 days then stop 11/04/12  Yes Jeanella Craze, NP  simvastatin (ZOCOR) 20 MG tablet Take 20 mg by mouth daily.   Yes Historical Provider, MD  Spacer/Aero-Holding Chambers (AEROCHAMBER PLUS FLO-VU MEDIUM) MISC 1  each by Other route once. 11/04/12  Yes Jeanella Craze, NP  tiotropium (SPIRIVA) 18 MCG inhalation capsule Place 18 mcg into inhaler and inhale daily.   Yes Historical Provider, MD    Allergies  Allergen Reactions  . Doxycycline     Unknown- patient doesn't remember  . Statins     Takes simvastatin at home  . Penicillins Rash     Physical Exam:  General - No distress ENT - No sinus tenderness, no oral exudate, no LAN Cardiac - s1s2 regular, no murmur Chest - Decreased breath sounds, no wheeze Back - No focal tenderness Abd - Soft, non-tender Ext - No edema Neuro - Normal strength, poor hearing Skin - No rashes Psych - normal mood, and behavior   Assessment/Plan:  Coralyn Helling, MD [facility] Pager:  [phone] 08/07/2013, 2:56 PM

## 2013-08-07 NOTE — Patient Instructions (Signed)
Follow up in 6 months 

## 2013-08-07 NOTE — Assessment & Plan Note (Addendum)
He has done better with combination of spiriva and symbicort.  He is to use albuterol as needed.  Encouraged him to increase his activity level as tolerated.

## 2013-08-13 ENCOUNTER — Telehealth: Payer: Self-pay | Admitting: *Deleted

## 2013-08-13 NOTE — Telephone Encounter (Signed)
Tc to wife. Explained that this could happen Her husband is moderately to severe stage of dementia.

## 2013-09-03 NOTE — Progress Notes (Signed)
I reviewed note and agree with plan.   Mone Commisso R. Ashunti Schofield, MD  Certified in Neurology, Neurophysiology and Neuroimaging  [facility] [address], [address] [place], [place] [address] [phone]   

## 2013-10-08 ENCOUNTER — Other Ambulatory Visit: Payer: Self-pay | Admitting: Nurse Practitioner

## 2013-10-20 ENCOUNTER — Ambulatory Visit (INDEPENDENT_AMBULATORY_CARE_PROVIDER_SITE_OTHER): Payer: Medicare Other | Admitting: *Deleted

## 2013-10-20 ENCOUNTER — Encounter: Payer: Self-pay | Admitting: Internal Medicine

## 2013-10-20 DIAGNOSIS — I441 Atrioventricular block, second degree: Secondary | ICD-10-CM

## 2013-10-20 LAB — MDC_IDC_ENUM_SESS_TYPE_REMOTE
Brady Statistic AP VS Percent: 8.7 %
Brady Statistic AS VP Percent: 1 %
Brady Statistic RA Percent Paced: 8.6 %
Brady Statistic RV Percent Paced: 1 %
Date Time Interrogation Session: 20150127084446
Implantable Pulse Generator Serial Number: 7228914
Lead Channel Impedance Value: 550 Ohm
Lead Channel Pacing Threshold Amplitude: 0.625 V
Lead Channel Pacing Threshold Pulse Width: 0.4 ms
Lead Channel Sensing Intrinsic Amplitude: 12 mV
Lead Channel Sensing Intrinsic Amplitude: 4.1 mV
Lead Channel Setting Pacing Pulse Width: 0.4 ms
Lead Channel Setting Sensing Sensitivity: 2 mV
MDC IDC MSMT BATTERY REMAINING LONGEVITY: 100 mo
MDC IDC MSMT BATTERY VOLTAGE: 2.95 V
MDC IDC MSMT LEADCHNL RA IMPEDANCE VALUE: 380 Ohm
MDC IDC MSMT LEADCHNL RA PACING THRESHOLD PULSEWIDTH: 0.4 ms
MDC IDC MSMT LEADCHNL RV PACING THRESHOLD AMPLITUDE: 2.25 V
MDC IDC SET LEADCHNL RA PACING AMPLITUDE: 1.625
MDC IDC SET LEADCHNL RV PACING AMPLITUDE: 2.5 V
MDC IDC STAT BRADY AP VP PERCENT: 1 %
MDC IDC STAT BRADY AS VS PERCENT: 91 %

## 2013-10-26 ENCOUNTER — Encounter: Payer: Self-pay | Admitting: *Deleted

## 2013-11-23 ENCOUNTER — Encounter: Payer: Self-pay | Admitting: Cardiology

## 2013-11-23 ENCOUNTER — Ambulatory Visit (INDEPENDENT_AMBULATORY_CARE_PROVIDER_SITE_OTHER): Payer: Medicare Other | Admitting: Cardiology

## 2013-11-23 VITALS — BP 132/80 | HR 68 | Ht 67.0 in | Wt 218.0 lb

## 2013-11-23 DIAGNOSIS — I441 Atrioventricular block, second degree: Secondary | ICD-10-CM

## 2013-11-23 DIAGNOSIS — I252 Old myocardial infarction: Secondary | ICD-10-CM

## 2013-11-23 DIAGNOSIS — I10 Essential (primary) hypertension: Secondary | ICD-10-CM

## 2013-11-23 DIAGNOSIS — I509 Heart failure, unspecified: Secondary | ICD-10-CM

## 2013-11-23 DIAGNOSIS — I5022 Chronic systolic (congestive) heart failure: Secondary | ICD-10-CM

## 2013-11-23 NOTE — Patient Instructions (Signed)
Continue your current medication  You need to lose weight by eating less and being more active ( walking )  I will see you in 6 months.

## 2013-11-23 NOTE — Progress Notes (Signed)
[name] Date of Birth: Jun 21, 1927   History of Present Illness: [name] is seen for followup today. He is status post acute anterior myocardial infarction in April of 2012 treated with direct stenting of the mid LAD. He also had second-degree heart block with significant bradycardia that was symptomatic and he underwent a permanent pacemaker. He is seen today in follow up with his wife. He is extremely hard of hearing. He has gained 10 lbs. His wife states he eats all the time and is very sedentary. He No dizziness.  Current Outpatient Prescriptions on File Prior to Visit  Medication Sig Dispense Refill  . albuterol (PROVENTIL HFA;VENTOLIN HFA) 108 (90 BASE) MCG/ACT inhaler Inhale 2 puffs into the lungs every 4 (four) hours as needed. For shortness of breath      . aspirin 325 MG tablet Take 162.5 mg by mouth daily.       . budesonide-formoterol (SYMBICORT) 160-4.5 MCG/ACT inhaler Inhale 1 puff into the lungs 2 (two) times daily.  1 Inhaler  0  . donepezil (ARICEPT) 10 MG tablet Take 1 tablet (10 mg total) by mouth daily at 8 pm.  30 tablet  6  . folic acid (FOLVITE) 1 MG tablet Take 1 mg by mouth daily.      . hydrochlorothiazide (HYDRODIURIL) 25 MG tablet Take 12.5 mg by mouth daily.      Marland Kitchen ipratropium-albuterol (DUONEB) 0.5-2.5 (3) MG/3ML SOLN Take 3 mLs by nebulization every 6 (six) hours as needed. For shortness of breath      . losartan (COZAAR) 50 MG tablet Take 1 tablet (50 mg total) by mouth daily.  30 tablet  11  . MAGNESIUM SULFATE PO Take 1 tablet by mouth daily.      . metoprolol (LOPRESSOR) 50 MG tablet Take 1 tablet (50 mg total) by mouth 2 (two) times daily.  180 tablet  3  . Multiple Vitamins-Minerals (CENTRUM SILVER PO) Take 1 tablet by mouth every morning.       Marland Kitchen NAMENDA XR 28 MG CP24 TAKE ONE CAPSULE BY MOUTH EVERY DAY  30 capsule  6  . Omega-3 Fatty Acids (FISH OIL PO) Take 2 capsules by mouth daily.      Marland Kitchen omeprazole (PRILOSEC OTC) 20 MG tablet Take 20 mg  by mouth 2 (two) times daily.      . polyethylene glycol (MIRALAX / GLYCOLAX) packet Take 17 g by mouth daily.      . simvastatin (ZOCOR) 20 MG tablet Take 20 mg by mouth daily.      Marland Kitchen tiotropium (SPIRIVA) 18 MCG inhalation capsule Place 18 mcg into inhaler and inhale daily.       No current facility-administered medications on file prior to visit.    Allergies  Allergen Reactions  . Doxycycline     Unknown- patient doesn't remember  . Statins     Takes simvastatin at home  . Penicillins Rash    Past Medical History  Diagnosis Date  . Chest discomfort   . Coronary artery disease   . Diabetes mellitus   . Hypertension   . Hyperlipidemia   . Obesities, morbid   . Gallstone pancreatitis     recurrent  . TIA (transient ischemic attack)   . Bacteremia due to vancomycin resistant Enterococcus   . GERD (gastroesophageal reflux disease)   . Hearing impairment   . Myocardial infarction, anterior wall, subsequent care   . Second degree AV block, Mobitz type II     s/p  PPM by [facility] 4/12  . ARF (acute renal failure)   . Cholangitis   . Duodenal ulcer   . GI bleed   . Anemia associated with acute blood loss   . 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 Dementia in Alzheimer's disease with delirium     Past Surgical History  Procedure Laterality Date  . Cholecystectomy    . Pacemaker placement  01/01/11    implanted by [facility] for mobitz II AV block  . Pancreatic stent      History  Smoking status  . Former Smoker -- 1.00 packs/day for 57 years  . Types: Cigarettes  . Quit date: 01/12/1996  Smokeless tobacco  . Never Used    History  Alcohol Use No    History reviewed. No pertinent family history.  Review of Systems: The review of systems is as noted in HPI. He is very hard of hearing.  All other systems were reviewed and are negative.  Physical Exam: BP 132/80  Pulse 68  Ht 5\' 7"  (1.702 m)  Wt 218 lb (98.884 kg)  BMI 34.14 kg/m2 He is  an elderly overweight white male who is very hard of hearing. He is in no distress. HEENT exam reveals he is wearing hearing aid. His pupils are equal round and reactive. Sclera are clear. Oropharynx is clear. Neck is without JVD, adenopathy, or bruits. Lungs are clear with diminished breath sounds throughout, no wheezes. Cardiac exam reveals a regular rate and rhythm without gallop, murmur, or click. His pacemaker site is normal. Abdomen is soft and nontender without masses or hepatosplenomegaly. Femoral and pedal pulses are 2+ and symmetric. He has no edema. Neurologic exam is nonfocal.  LABORATORY DATA: Ecg shows NSR with old anterolateral infarct. LAD.  Pacemaker check in January was satisfactory.  Assessment / Plan: 1. Coronary disease status post anterior myocardial infarction treated with stenting of the LAD. He is asymptomatic. We will continue with aspirin, metoprolol, and statin therapy.   2. Severe COPD.  3. Morbid obesity. He has gained 10 pounds since his last visit. Encouraged him to eat less and increase his activity.  4. Hyperlipidemia. Continue statin therapy.  5. Chronic diastolic congestive heart failure. He appears to be well compensated on his current medical therapy.  I have requested his most recent lab work from his primary care. I will see in 6 months.

## 2013-12-04 ENCOUNTER — Emergency Department (HOSPITAL_COMMUNITY): Payer: Medicare Other

## 2013-12-04 ENCOUNTER — Encounter (HOSPITAL_COMMUNITY): Payer: Self-pay | Admitting: Emergency Medicine

## 2013-12-04 ENCOUNTER — Inpatient Hospital Stay (HOSPITAL_COMMUNITY)
Admission: EM | Admit: 2013-12-04 | Discharge: 2013-12-07 | DRG: 871 | Disposition: A | Discharge status: Home / Self Care | Payer: Medicare Other | Attending: Internal Medicine | Admitting: Internal Medicine

## 2013-12-04 DIAGNOSIS — H9190 Unspecified hearing loss, unspecified ear: Secondary | ICD-10-CM | POA: Diagnosis present

## 2013-12-04 DIAGNOSIS — I441 Atrioventricular block, second degree: Secondary | ICD-10-CM

## 2013-12-04 DIAGNOSIS — R7881 Bacteremia: Secondary | ICD-10-CM

## 2013-12-04 DIAGNOSIS — J440 Chronic obstructive pulmonary disease with acute lower respiratory infection: Secondary | ICD-10-CM | POA: Diagnosis present

## 2013-12-04 DIAGNOSIS — I129 Hypertensive chronic kidney disease with stage 1 through stage 4 chronic kidney disease, or unspecified chronic kidney disease: Secondary | ICD-10-CM | POA: Diagnosis present

## 2013-12-04 DIAGNOSIS — F03918 Unspecified dementia, unspecified severity, with other behavioral disturbance: Secondary | ICD-10-CM | POA: Diagnosis present

## 2013-12-04 DIAGNOSIS — I959 Hypotension, unspecified: Secondary | ICD-10-CM | POA: Diagnosis present

## 2013-12-04 DIAGNOSIS — J441 Chronic obstructive pulmonary disease with (acute) exacerbation: Secondary | ICD-10-CM

## 2013-12-04 DIAGNOSIS — J9601 Acute respiratory failure with hypoxia: Secondary | ICD-10-CM | POA: Diagnosis present

## 2013-12-04 DIAGNOSIS — I252 Old myocardial infarction: Secondary | ICD-10-CM

## 2013-12-04 DIAGNOSIS — G309 Alzheimer's disease, unspecified: Secondary | ICD-10-CM | POA: Diagnosis present

## 2013-12-04 DIAGNOSIS — I10 Essential (primary) hypertension: Secondary | ICD-10-CM

## 2013-12-04 DIAGNOSIS — I509 Heart failure, unspecified: Secondary | ICD-10-CM | POA: Diagnosis present

## 2013-12-04 DIAGNOSIS — I498 Other specified cardiac arrhythmias: Secondary | ICD-10-CM | POA: Diagnosis present

## 2013-12-04 DIAGNOSIS — I2510 Atherosclerotic heart disease of native coronary artery without angina pectoris: Secondary | ICD-10-CM | POA: Diagnosis present

## 2013-12-04 DIAGNOSIS — R0789 Other chest pain: Secondary | ICD-10-CM

## 2013-12-04 DIAGNOSIS — Z79899 Other long term (current) drug therapy: Secondary | ICD-10-CM

## 2013-12-04 DIAGNOSIS — R0902 Hypoxemia: Secondary | ICD-10-CM

## 2013-12-04 DIAGNOSIS — R4182 Altered mental status, unspecified: Secondary | ICD-10-CM

## 2013-12-04 DIAGNOSIS — F0280 Dementia in other diseases classified elsewhere without behavioral disturbance: Secondary | ICD-10-CM | POA: Diagnosis present

## 2013-12-04 DIAGNOSIS — Z8673 Personal history of transient ischemic attack (TIA), and cerebral infarction without residual deficits: Secondary | ICD-10-CM

## 2013-12-04 DIAGNOSIS — J9600 Acute respiratory failure, unspecified whether with hypoxia or hypercapnia: Secondary | ICD-10-CM | POA: Diagnosis present

## 2013-12-04 DIAGNOSIS — N182 Chronic kidney disease, stage 2 (mild): Secondary | ICD-10-CM | POA: Diagnosis present

## 2013-12-04 DIAGNOSIS — R6520 Severe sepsis without septic shock: Secondary | ICD-10-CM

## 2013-12-04 DIAGNOSIS — Z7982 Long term (current) use of aspirin: Secondary | ICD-10-CM

## 2013-12-04 DIAGNOSIS — K219 Gastro-esophageal reflux disease without esophagitis: Secondary | ICD-10-CM | POA: Diagnosis present

## 2013-12-04 DIAGNOSIS — E119 Type 2 diabetes mellitus without complications: Secondary | ICD-10-CM | POA: Diagnosis present

## 2013-12-04 DIAGNOSIS — F068 Other specified mental disorders due to known physiological condition: Secondary | ICD-10-CM

## 2013-12-04 DIAGNOSIS — J209 Acute bronchitis, unspecified: Secondary | ICD-10-CM | POA: Diagnosis present

## 2013-12-04 DIAGNOSIS — Z950 Presence of cardiac pacemaker: Secondary | ICD-10-CM

## 2013-12-04 DIAGNOSIS — A419 Sepsis, unspecified organism: Principal | ICD-10-CM | POA: Diagnosis present

## 2013-12-04 DIAGNOSIS — J439 Emphysema, unspecified: Secondary | ICD-10-CM

## 2013-12-04 DIAGNOSIS — Z1621 Resistance to vancomycin: Secondary | ICD-10-CM

## 2013-12-04 DIAGNOSIS — Z66 Do not resuscitate: Secondary | ICD-10-CM | POA: Diagnosis present

## 2013-12-04 DIAGNOSIS — E872 Acidosis, unspecified: Secondary | ICD-10-CM | POA: Diagnosis present

## 2013-12-04 DIAGNOSIS — R413 Other amnesia: Secondary | ICD-10-CM

## 2013-12-04 DIAGNOSIS — D72829 Elevated white blood cell count, unspecified: Secondary | ICD-10-CM

## 2013-12-04 DIAGNOSIS — G459 Transient cerebral ischemic attack, unspecified: Secondary | ICD-10-CM

## 2013-12-04 DIAGNOSIS — F0391 Unspecified dementia with behavioral disturbance: Secondary | ICD-10-CM

## 2013-12-04 DIAGNOSIS — K851 Biliary acute pancreatitis without necrosis or infection: Secondary | ICD-10-CM

## 2013-12-04 DIAGNOSIS — I5022 Chronic systolic (congestive) heart failure: Secondary | ICD-10-CM | POA: Diagnosis present

## 2013-12-04 DIAGNOSIS — Z87891 Personal history of nicotine dependence: Secondary | ICD-10-CM

## 2013-12-04 DIAGNOSIS — R911 Solitary pulmonary nodule: Secondary | ICD-10-CM | POA: Diagnosis present

## 2013-12-04 DIAGNOSIS — E785 Hyperlipidemia, unspecified: Secondary | ICD-10-CM | POA: Diagnosis present

## 2013-12-04 LAB — CBC WITH DIFFERENTIAL/PLATELET
BASOS ABS: 0 10*3/uL (ref 0.0–0.1)
Basophils Relative: 0 % (ref 0–1)
Eosinophils Absolute: 0 10*3/uL (ref 0.0–0.7)
Eosinophils Relative: 0 % (ref 0–5)
HCT: 43 % (ref 39.0–52.0)
HEMOGLOBIN: 14.6 g/dL (ref 13.0–17.0)
LYMPHS ABS: 2.2 10*3/uL (ref 0.7–4.0)
LYMPHS PCT: 13 % (ref 12–46)
MCH: 32.1 pg (ref 26.0–34.0)
MCHC: 34 g/dL (ref 30.0–36.0)
MCV: 94.5 fL (ref 78.0–100.0)
MONOS PCT: 4 % (ref 3–12)
Monocytes Absolute: 0.6 10*3/uL (ref 0.1–1.0)
NEUTROS ABS: 13.3 10*3/uL — AB (ref 1.7–7.7)
NEUTROS PCT: 83 % — AB (ref 43–77)
Platelets: 195 10*3/uL (ref 150–400)
RBC: 4.55 MIL/uL (ref 4.22–5.81)
RDW: 13.6 % (ref 11.5–15.5)
WBC: 16 10*3/uL — AB (ref 4.0–10.5)

## 2013-12-04 LAB — COMPREHENSIVE METABOLIC PANEL
ALT: 14 U/L (ref 0–53)
AST: 21 U/L (ref 0–37)
Albumin: 3.3 g/dL — ABNORMAL LOW (ref 3.5–5.2)
Alkaline Phosphatase: 111 U/L (ref 39–117)
BUN: 13 mg/dL (ref 6–23)
CALCIUM: 9.1 mg/dL (ref 8.4–10.5)
CO2: 24 meq/L (ref 19–32)
CREATININE: 1.29 mg/dL (ref 0.50–1.35)
Chloride: 96 mEq/L (ref 96–112)
GFR calc Af Amer: 57 mL/min — ABNORMAL LOW (ref >90)
GFR, EST NON AFRICAN AMERICAN: 49 mL/min — AB (ref >90)
Glucose, Bld: 219 mg/dL — ABNORMAL HIGH (ref 70–99)
Potassium: 3.4 mEq/L — ABNORMAL LOW (ref 3.7–5.3)
SODIUM: 137 meq/L (ref 137–147)
TOTAL PROTEIN: 6.6 g/dL (ref 6.0–8.3)
Total Bilirubin: 0.7 mg/dL (ref 0.3–1.2)

## 2013-12-04 LAB — URINE MICROSCOPIC-ADD ON

## 2013-12-04 LAB — GLUCOSE, CAPILLARY: GLUCOSE-CAPILLARY: 140 mg/dL — AB (ref 70–99)

## 2013-12-04 LAB — URINALYSIS, ROUTINE W REFLEX MICROSCOPIC
Glucose, UA: NEGATIVE mg/dL
HGB URINE DIPSTICK: NEGATIVE
Ketones, ur: 15 mg/dL — AB
LEUKOCYTES UA: NEGATIVE
Nitrite: NEGATIVE
PROTEIN: 30 mg/dL — AB
SPECIFIC GRAVITY, URINE: 1.017 (ref 1.005–1.030)
Urobilinogen, UA: 2 mg/dL — ABNORMAL HIGH (ref 0.0–1.0)
pH: 7.5 (ref 5.0–8.0)

## 2013-12-04 LAB — I-STAT CG4 LACTIC ACID, ED: Lactic Acid, Venous: 3.07 mmol/L — ABNORMAL HIGH (ref 0.5–2.2)

## 2013-12-04 MED ORDER — IPRATROPIUM-ALBUTEROL 0.5-2.5 (3) MG/3ML IN SOLN: 3.0000 mL | Freq: Once | RESPIRATORY_TRACT | Status: AC

## 2013-12-04 MED ORDER — DEXTROSE 5 % IV SOLN
500.0000 mg | Freq: Once | INTRAVENOUS | Status: AC
  Administered 2013-12-04: 500 mg via INTRAVENOUS

## 2013-12-04 MED ORDER — ACETAMINOPHEN 325 MG PO TABS: 650.0000 mg | ORAL_TABLET | Freq: Four times a day (QID) | ORAL | Status: DC | PRN

## 2013-12-04 MED ORDER — DONEPEZIL HCL 10 MG PO TABS
10.0000 mg | ORAL_TABLET | Freq: Every day | ORAL | Status: DC
  Administered 2013-12-04 – 2013-12-06 (×3): 10 mg via ORAL
  Filled 2013-12-04 (×5): qty 1

## 2013-12-04 MED ORDER — ASPIRIN 81 MG PO CHEW
162.0000 mg | CHEWABLE_TABLET | Freq: Every day | ORAL | Status: DC
  Administered 2013-12-05 – 2013-12-07 (×3): 162 mg via ORAL
  Filled 2013-12-04 (×3): qty 2

## 2013-12-04 MED ORDER — DEXTROSE 5 % IV SOLN
1.0000 g | INTRAVENOUS | Status: DC
  Administered 2013-12-05: 1 g via INTRAVENOUS
  Filled 2013-12-04 (×2): qty 10

## 2013-12-04 MED ORDER — ENOXAPARIN SODIUM 40 MG/0.4ML ~~LOC~~ SOLN
40.0000 mg | SUBCUTANEOUS | Status: DC
  Administered 2013-12-05 – 2013-12-07 (×3): 40 mg via SUBCUTANEOUS
  Filled 2013-12-04 (×3): qty 0.4

## 2013-12-04 MED ORDER — SIMVASTATIN 20 MG PO TABS
20.0000 mg | ORAL_TABLET | Freq: Every evening | ORAL | Status: DC
  Administered 2013-12-04 – 2013-12-07 (×4): 20 mg via ORAL
  Filled 2013-12-04 (×4): qty 1

## 2013-12-04 MED ORDER — SODIUM CHLORIDE 0.9 % IV BOLUS (SEPSIS)
500.0000 mL | Freq: Once | INTRAVENOUS | Status: AC
  Administered 2013-12-04: 500 mL via INTRAVENOUS

## 2013-12-04 MED ORDER — DEXTROSE 5 % IV SOLN
1.0000 g | Freq: Once | INTRAVENOUS | Status: AC
  Administered 2013-12-04: 1 g via INTRAVENOUS
  Filled 2013-12-04: qty 10

## 2013-12-04 MED ORDER — ONDANSETRON HCL 4 MG/2ML IJ SOLN: 4.0000 mg | Freq: Four times a day (QID) | INTRAMUSCULAR | Status: DC | PRN

## 2013-12-04 MED ORDER — VANCOMYCIN HCL 10 G IV SOLR
1250.0000 mg | INTRAVENOUS | Status: DC
  Administered 2013-12-05: 1250 mg via INTRAVENOUS
  Filled 2013-12-04: qty 1250

## 2013-12-04 MED ORDER — DOCUSATE SODIUM 100 MG PO CAPS
100.0000 mg | ORAL_CAPSULE | Freq: Two times a day (BID) | ORAL | Status: DC
  Administered 2013-12-04 – 2013-12-07 (×6): 100 mg via ORAL
  Filled 2013-12-04 (×7): qty 1

## 2013-12-04 MED ORDER — ONDANSETRON HCL 4 MG PO TABS: 4.0000 mg | ORAL_TABLET | Freq: Four times a day (QID) | ORAL | Status: DC | PRN

## 2013-12-04 MED ORDER — DEXTROSE 5 % IV SOLN
500.0000 mg | INTRAVENOUS | Status: DC
  Administered 2013-12-05: 500 mg via INTRAVENOUS
  Filled 2013-12-04 (×2): qty 500

## 2013-12-04 MED ORDER — GUAIFENESIN ER 600 MG PO TB12
600.0000 mg | ORAL_TABLET | Freq: Two times a day (BID) | ORAL | Status: DC
  Administered 2013-12-04 – 2013-12-07 (×6): 600 mg via ORAL
  Filled 2013-12-04 (×7): qty 1

## 2013-12-04 MED ORDER — LEVALBUTEROL HCL 0.63 MG/3ML IN NEBU: 0.6300 mg | INHALATION_SOLUTION | Freq: Four times a day (QID) | RESPIRATORY_TRACT | Status: DC | PRN

## 2013-12-04 MED ORDER — SODIUM CHLORIDE 0.9 % IV SOLN
1000.0000 mL | INTRAVENOUS | Status: DC
  Administered 2013-12-04: 1000 mL via INTRAVENOUS

## 2013-12-04 MED ORDER — INSULIN ASPART 100 UNIT/ML ~~LOC~~ SOLN: 0.0000 [IU] | Freq: Every day | SUBCUTANEOUS | Status: DC

## 2013-12-04 MED ORDER — INSULIN ASPART 100 UNIT/ML ~~LOC~~ SOLN
0.0000 [IU] | Freq: Three times a day (TID) | SUBCUTANEOUS | Status: DC
  Administered 2013-12-05 – 2013-12-06 (×4): 3 [IU] via SUBCUTANEOUS
  Administered 2013-12-06: 5 [IU] via SUBCUTANEOUS
  Administered 2013-12-06 – 2013-12-07 (×2): 2 [IU] via SUBCUTANEOUS

## 2013-12-04 MED ORDER — IPRATROPIUM-ALBUTEROL 0.5-2.5 (3) MG/3ML IN SOLN
3.0000 mL | Freq: Once | RESPIRATORY_TRACT | Status: AC
  Administered 2013-12-04: 3 mL via RESPIRATORY_TRACT
  Filled 2013-12-04: qty 3

## 2013-12-04 MED ORDER — BISACODYL 10 MG RE SUPP: 10.0000 mg | Freq: Every day | RECTAL | Status: DC | PRN

## 2013-12-04 MED ORDER — ACETAMINOPHEN 650 MG RE SUPP: 650.0000 mg | Freq: Four times a day (QID) | RECTAL | Status: DC | PRN

## 2013-12-04 MED ORDER — SODIUM CHLORIDE 0.9 % IV SOLN
1000.0000 mL | Freq: Once | INTRAVENOUS | Status: AC
  Administered 2013-12-04: 1000 mL via INTRAVENOUS

## 2013-12-04 MED ORDER — PANTOPRAZOLE SODIUM 40 MG PO TBEC
40.0000 mg | DELAYED_RELEASE_TABLET | Freq: Two times a day (BID) | ORAL | Status: DC
  Administered 2013-12-04 – 2013-12-07 (×6): 40 mg via ORAL
  Filled 2013-12-04 (×6): qty 1

## 2013-12-04 MED ORDER — SODIUM CHLORIDE 0.9 % IV SOLN
INTRAVENOUS | Status: AC
  Administered 2013-12-04 – 2013-12-05 (×2): via INTRAVENOUS

## 2013-12-04 MED ORDER — ASPIRIN 325 MG PO TABS: 162.5000 mg | ORAL_TABLET | Freq: Every day | ORAL | Status: DC

## 2013-12-04 MED ORDER — IPRATROPIUM BROMIDE 0.02 % IN SOLN: 0.5000 mg | Freq: Four times a day (QID) | RESPIRATORY_TRACT | Status: DC

## 2013-12-04 MED ORDER — ACETAMINOPHEN 650 MG RE SUPP
650.0000 mg | Freq: Once | RECTAL | Status: DC
  Filled 2013-12-04: qty 1

## 2013-12-04 MED ORDER — SENNA 8.6 MG PO TABS
1.0000 | ORAL_TABLET | Freq: Two times a day (BID) | ORAL | Status: DC
  Administered 2013-12-04 – 2013-12-07 (×6): 8.6 mg via ORAL
  Filled 2013-12-04 (×7): qty 1

## 2013-12-04 MED ORDER — OSELTAMIVIR PHOSPHATE 30 MG PO CAPS
30.0000 mg | ORAL_CAPSULE | Freq: Two times a day (BID) | ORAL | Status: DC
  Administered 2013-12-04 – 2013-12-05 (×2): 30 mg via ORAL
  Filled 2013-12-04 (×3): qty 1

## 2013-12-04 MED ORDER — MEMANTINE HCL ER 28 MG PO CP24
28.0000 mg | ORAL_CAPSULE | Freq: Every day | ORAL | Status: DC
  Administered 2013-12-05 – 2013-12-07 (×3): 28 mg via ORAL
  Filled 2013-12-04 (×3): qty 28

## 2013-12-04 MED ORDER — ACETAMINOPHEN 325 MG PO TABS: 650.0000 mg | ORAL_TABLET | Freq: Once | ORAL | Status: DC

## 2013-12-04 MED ORDER — POLYETHYLENE GLYCOL 3350 17 G PO PACK
17.0000 g | PACK | Freq: Every day | ORAL | Status: DC | PRN
  Filled 2013-12-04: qty 1

## 2013-12-04 MED ORDER — HYDROCODONE-ACETAMINOPHEN 5-325 MG PO TABS: 1.0000 | ORAL_TABLET | ORAL | Status: DC | PRN

## 2013-12-04 MED ORDER — BUDESONIDE-FORMOTEROL FUMARATE 160-4.5 MCG/ACT IN AERO
1.0000 | INHALATION_SPRAY | Freq: Two times a day (BID) | RESPIRATORY_TRACT | Status: DC
Start: 2013-12-04 — End: 2013-12-07
  Administered 2013-12-05 – 2013-12-07 (×5): 1 via RESPIRATORY_TRACT
  Filled 2013-12-04: qty 6

## 2013-12-04 MED ORDER — SODIUM CHLORIDE 0.9 % IJ SOLN
3.0000 mL | Freq: Two times a day (BID) | INTRAMUSCULAR | Status: DC
  Administered 2013-12-04 – 2013-12-07 (×4): 3 mL via INTRAVENOUS

## 2013-12-04 NOTE — Progress Notes (Signed)
ANTIBIOTIC CONSULT NOTE - INITIAL  Pharmacy Consult for vancomycin Indication: rule out sepsis  Allergies  Allergen Reactions  . Doxycycline Other (See Comments)    Unknown- patient doesn't remember  . Penicillins Rash    Patient Measurements: Height: 5\' 6"  (167.6 cm) Weight: 209 lb 14.1 oz (95.2 kg) IBW/kg (Calculated) : 63.8  Vital Signs: Temp: 98.3 F (36.8 C) (03/13 2145) Temp src: Oral (03/13 2145) BP: 111/64 mmHg (03/13 2146) Pulse Rate: 101 (03/13 2146)  Labs:  Recent Labs  12/04/13 1511  WBC 16.0*  HGB 14.6  PLT 195  CREATININE 1.29   Estimated Creatinine Clearance: 46.1 ml/min (by C-G formula based on Cr of 1.29).   Microbiology: No results found for this or any previous visit (from the past 720 hour(s)).  Medical History: Past Medical History  Diagnosis Date  . Chest discomfort   . Coronary artery disease   . Diabetes mellitus   . Hypertension   . Hyperlipidemia   . Obesities, morbid   . Gallstone pancreatitis     recurrent  . TIA (transient ischemic attack)   . Bacteremia due to vancomycin resistant Enterococcus   . GERD (gastroesophageal reflux disease)   . Hearing impairment   . Myocardial infarction, anterior wall, subsequent care   . Second degree AV block, Mobitz type II     s/p PPM by JA 4/12  . ARF (acute renal failure)   . Cholangitis   . Duodenal ulcer   . GI bleed   . Anemia associated with acute blood loss   . 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 Dementia in Alzheimer's disease with delirium     Medications:  Prescriptions prior to admission  Medication Sig Dispense Refill  . albuterol (PROVENTIL HFA;VENTOLIN HFA) 108 (90 BASE) MCG/ACT inhaler Inhale 2 puffs into the lungs every 4 (four) hours as needed. For shortness of breath      . aspirin 325 MG tablet Take 162.5 mg by mouth daily.       . budesonide-formoterol (SYMBICORT) 160-4.5 MCG/ACT inhaler Inhale 1 puff into the lungs 2 (two)  times daily.  1 Inhaler  0  . donepezil (ARICEPT) 10 MG tablet Take 1 tablet (10 mg total) by mouth daily at 8 pm.  30 tablet  6  . folic acid (FOLVITE) 1 MG tablet Take 1 mg by mouth daily.      . hydrochlorothiazide (HYDRODIURIL) 25 MG tablet Take 12.5 mg by mouth daily.      Marland Kitchen ipratropium-albuterol (DUONEB) 0.5-2.5 (3) MG/3ML SOLN Take 3 mLs by nebulization every 6 (six) hours as needed. For shortness of breath      . losartan (COZAAR) 50 MG tablet Take 1 tablet (50 mg total) by mouth daily.  30 tablet  11  . MAGNESIUM SULFATE PO Take 1 tablet by mouth daily.      . Memantine HCl ER (NAMENDA XR) 28 MG CP24 Take 28 mg by mouth daily.       . metoprolol (LOPRESSOR) 50 MG tablet Take 1 tablet (50 mg total) by mouth 2 (two) times daily.  180 tablet  3  . Multiple Vitamins-Minerals (CENTRUM SILVER PO) Take 1 tablet by mouth every morning.       . Omega-3 Fatty Acids (FISH OIL PO) Take 2 capsules by mouth daily.      Marland Kitchen omeprazole (PRILOSEC OTC) 20 MG tablet Take 20 mg by mouth 2 (two) times daily.      Vladimir Faster Glycol-Propyl  Glycol (SYSTANE) 0.4-0.3 % SOLN Apply 1 drop to eye daily as needed (for redness).      . polyethylene glycol (MIRALAX / GLYCOLAX) packet Take 17 g by mouth daily.      . simvastatin (ZOCOR) 20 MG tablet Take 20 mg by mouth every evening.       . tiotropium (SPIRIVA) 18 MCG inhalation capsule Place 18 mcg into inhaler and inhale daily.       Scheduled:  . [START ON 12/05/2013] aspirin  162 mg Oral Daily  . [START ON 12/05/2013] azithromycin  500 mg Intravenous Q24H  . budesonide-formoterol  1 puff Inhalation BID  . [START ON 12/05/2013] cefTRIAXone (ROCEPHIN)  IV  1 g Intravenous Q24H  . docusate sodium  100 mg Oral BID  . donepezil  10 mg Oral Q2000  . [START ON 12/05/2013] enoxaparin (LOVENOX) injection  40 mg Subcutaneous Q24H  . guaiFENesin  600 mg Oral BID  . [START ON 12/05/2013] insulin aspart  0-15 Units Subcutaneous TID WC  . insulin aspart  0-5 Units Subcutaneous  QHS  . ipratropium  0.5 mg Nebulization Q6H  . ipratropium-albuterol  3 mL Nebulization Once  . [START ON 12/05/2013] Memantine HCl ER  28 mg Oral Daily  . oseltamivir  30 mg Oral BID  . pantoprazole  40 mg Oral BID  . senna  1 tablet Oral BID  . simvastatin  20 mg Oral QPM  . sodium chloride  3 mL Intravenous Q12H  . [START ON 12/05/2013] vancomycin  1,250 mg Intravenous Q24H   Infusions:  . sodium chloride      Assessment: 80yo c/o sudden onset of weakness and AMS, found to have rales and expiratory wheezes, initial CXR negative, concern for sepsis given hypotension, to begin IV ABX.  Goal of Therapy:  Vancomycin trough level 15-20 mcg/ml  Plan:  Will begin vancomycin 1250mg  IV Q24H and monitor CBC, Cx, levels prn.  Wynona Neat, PharmD, BCPS  12/04/2013,10:49 PM

## 2013-12-04 NOTE — ED Notes (Signed)
Pt transported to CT ?

## 2013-12-04 NOTE — ED Provider Notes (Signed)
CSN: [id number]     Arrival date & time 12/04/13  1455 History   First MD Initiated Contact with Patient 12/04/13 1504     Chief Complaint  Patient presents with  . Fatigue  . Altered Mental Status     (Consider location/radiation/quality/duration/timing/severity/associated sxs/prior Treatment) HPI Comments: 80 year old male presents with weakness, and altered mental status. Wife states he's been different since he awoke this AM. Has been diffusely weak and confused.  EMS reports the patient was pale, diaphoretic, and hypotensive to 90 systolic. Did not give him any fluids and his blood pressure came back to normal. The patient was given a DuoNeb prior to arrival. Further history is unobtainable from the patient.  His glucose was normal per EMS.   Past Medical History  Diagnosis Date  . Chest discomfort   . Coronary artery disease   . Diabetes mellitus   . Hypertension   . Hyperlipidemia   . Obesities, morbid   . Gallstone pancreatitis     recurrent  . TIA (transient ischemic attack)   . Bacteremia due to vancomycin resistant Enterococcus   . GERD (gastroesophageal reflux disease)   . Hearing impairment   . Myocardial infarction, anterior wall, subsequent care   . Second degree AV block, Mobitz type II     s/p PPM by JA 4/12  . ARF (acute renal failure)   . Cholangitis   . Duodenal ulcer   . GI bleed   . Anemia associated with acute blood loss   . 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 Dementia in Alzheimer's disease with delirium    Past Surgical History  Procedure Laterality Date  . Cholecystectomy    . Pacemaker placement  01/01/11    implanted by JA for mobitz II AV block  . Pancreatic stent     History reviewed. No pertinent family history. History  Substance Use Topics  . Smoking status: Former Smoker -- 1.00 packs/day for 57 years    Types: Cigarettes    Quit date: 01/12/1996  . Smokeless tobacco: Never Used  . Alcohol Use:  No    Review of Systems  Unable to perform ROS: Mental status change      Allergies  Doxycycline; Statins; and Penicillins  Home Medications   Current Outpatient Rx  Name  Route  Sig  Dispense  Refill  . albuterol (PROVENTIL HFA;VENTOLIN HFA) 108 (90 BASE) MCG/ACT inhaler   Inhalation   Inhale 2 puffs into the lungs every 4 (four) hours as needed. For shortness of breath         . aspirin 325 MG tablet   Oral   Take 162.5 mg by mouth daily.          . budesonide-formoterol (SYMBICORT) 160-4.5 MCG/ACT inhaler   Inhalation   Inhale 1 puff into the lungs 2 (two) times daily.   1 Inhaler   0   . donepezil (ARICEPT) 10 MG tablet   Oral   Take 1 tablet (10 mg total) by mouth daily at 8 pm.   30 tablet   6   . folic acid (FOLVITE) 1 MG tablet   Oral   Take 1 mg by mouth daily.         . hydrochlorothiazide (HYDRODIURIL) 25 MG tablet   Oral   Take 12.5 mg by mouth daily.         Marland Kitchen ipratropium-albuterol (DUONEB) 0.5-2.5 (3) MG/3ML SOLN   Nebulization   Take 3 mLs by  nebulization every 6 (six) hours as needed. For shortness of breath         . losartan (COZAAR) 50 MG tablet   Oral   Take 1 tablet (50 mg total) by mouth daily.   30 tablet   11   . MAGNESIUM SULFATE PO   Oral   Take 1 tablet by mouth daily.         . metoprolol (LOPRESSOR) 50 MG tablet   Oral   Take 1 tablet (50 mg total) by mouth 2 (two) times daily.   180 tablet   3   . Multiple Vitamins-Minerals (CENTRUM SILVER PO)   Oral   Take 1 tablet by mouth every morning.          . Omega-3 Fatty Acids (FISH OIL PO)   Oral   Take 2 capsules by mouth daily.         Marland Kitchen omeprazole (PRILOSEC OTC) 20 MG tablet   Oral   Take 20 mg by mouth 2 (two) times daily.         . polyethylene glycol (MIRALAX / GLYCOLAX) packet   Oral   Take 17 g by mouth daily.         . simvastatin (ZOCOR) 20 MG tablet   Oral   Take 20 mg by mouth daily.         Marland Kitchen tiotropium (SPIRIVA) 18 MCG  inhalation capsule   Inhalation   Place 18 mcg into inhaler and inhale daily.          BP 135/71  Pulse 123  Temp(Src) 98.8 F (37.1 C) (Oral)  Resp 24  SpO2 94% Physical Exam  Nursing note and vitals reviewed. Constitutional: He appears well-developed and well-nourished.  HENT:  Head: Normocephalic and atraumatic.  Right Ear: External ear normal.  Left Ear: External ear normal.  Nose: Nose normal.  Eyes: Pupils are equal, round, and reactive to light. Right eye exhibits no discharge. Left eye exhibits no discharge.  Neck: Neck supple.  Cardiovascular: Regular rhythm, normal heart sounds and intact distal pulses.  Tachycardia present.   Pulmonary/Chest: Effort normal. He has decreased breath sounds. He has wheezes.  Abdominal: Soft. He exhibits no distension. There is no tenderness.  Musculoskeletal: He exhibits no edema.  Neurological: He is alert. He is disoriented.  Equal strength in all 4 extremities. Alert, oriented to self and place, disoriented to time. Normal smile.  Skin: Skin is warm and dry.  Very dry skin    ED Course  Procedures (including critical care time) Labs Review Labs Reviewed  CBC WITH DIFFERENTIAL - Abnormal; Notable for the following:    WBC 16.0 (*)    Neutrophils Relative % 83 (*)    Neutro Abs 13.3 (*)    All other components within normal limits  COMPREHENSIVE METABOLIC PANEL - Abnormal; Notable for the following:    Potassium 3.4 (*)    Glucose, Bld 219 (*)    Albumin 3.3 (*)    GFR calc non Af Amer 49 (*)    GFR calc Af Amer 57 (*)    All other components within normal limits  URINALYSIS, ROUTINE W REFLEX MICROSCOPIC - Abnormal; Notable for the following:    Bilirubin Urine SMALL (*)    Ketones, ur 15 (*)    Protein, ur 30 (*)    Urobilinogen, UA 2.0 (*)    All other components within normal limits  URINE MICROSCOPIC-ADD ON - Abnormal; Notable for the following:  Casts HYALINE CASTS (*)    All other components within normal  limits  I-STAT CG4 LACTIC ACID, ED - Abnormal; Notable for the following:    Lactic Acid, Venous 3.07 (*)    All other components within normal limits  CULTURE, BLOOD (ROUTINE X 2)  CULTURE, BLOOD (ROUTINE X 2)  URINE CULTURE  INFLUENZA PANEL BY PCR (TYPE A & B, H1N1)   Imaging Review Ct Head Wo Contrast  12/04/2013   CLINICAL DATA:  Altered mental status, prior transient ischemic attack  EXAM: CT HEAD WITHOUT CONTRAST  TECHNIQUE: Contiguous axial images were obtained from the base of the skull through the vertex without intravenous contrast.  COMPARISON:  DG CERVICAL SPINE 2-3V dated 10/23/2011; CT HEAD W/O CM dated 05/11/2011  FINDINGS: Mild cortical volume loss noted with proportional ventricular prominence. Areas of periventricular white matter hypodensity are most compatible with small vessel ischemic change. No acute hemorrhage, infarct, or mass lesion is identified. Mild pansinusitis is noted with mucoperiosteal thickening. Mild motion artifact is noted at the skull base. No skull fracture. Orbits are grossly unremarkable. Right external capsule lacunar infarct reidentified.  IMPRESSION: No acute intracranial abnormality.  Chronic findings as above.   Electronically Signed   By: Conchita Paris M.D.   On: 12/04/2013 16:41   Dg Chest Port 1 View  12/04/2013   CLINICAL DATA:  Shortness of breath, fatigue, altered mental status, history hypertension, coronary artery disease, diabetes, COPD  EXAM: PORTABLE CHEST - 1 VIEW  COMPARISON:  Portable exam 1600 hr compared to 10/29/2012.  FINDINGS: Left subclavian transvenous pacemaker leads project over right atrium right ventricle.  Normal heart size, mediastinal contours and pulmonary vascularity.  Lungs clear.  No pleural effusion or pneumothorax.  Bones demineralized.  IMPRESSION: No acute abnormalities.   Electronically Signed   By: Lavonia Dana M.D.   On: 12/04/2013 17:06     EKG Interpretation   Date/Time:  Friday December 04 2013 14:59:42  EDT Ventricular Rate:  120 PR Interval:  117 QRS Duration: 94 QT Interval:  380 QTC Calculation: 537 R Axis:   -80 Text Interpretation:  Age not entered, assumed to be  80 years old for  purpose of ECG interpretation Sinus or ectopic atrial tachycardia  Anterolateral infarct, old Prolonged QT interval nonspecific conduction  delay No significant change since last tracing Confirmed by [place] ([id number]) on 12/04/2013 3:29:45 PM      MDM   Final diagnoses:  Altered mental state  Hypoxia  COPD exacerbation    Patient upgraded to code sepsis after lactate and rectal temp. Given fluids in ED, no hypotension. Mental status improved with fluids, duoneb and abx. Given his hypoxia and diffuse lung disease on exam, I feel pulmonary source is most likely. No neck stiffness. No focal neuro signs. Will treat as CAP, test for flu and admit to hospitalist.     Ephraim Hamburger, MD 12/05/13 [phone]

## 2013-12-04 NOTE — H&P (Addendum)
PCP: Stephens Shire, MD  Cardiology [place], [facility]:  [place]  Chief Complaint:  confusion HPI: [name] is a 80 y.o. male   has a past medical history of Chest discomfort; Coronary artery disease; Diabetes mellitus; Hypertension; Hyperlipidemia; Obesities, morbid; Gallstone pancreatitis; TIA (transient ischemic attack); Bacteremia due to vancomycin resistant Enterococcus; GERD (gastroesophageal reflux disease); Hearing impairment; Myocardial infarction, anterior wall, subsequent care; Second degree AV block, Mobitz type II; ARF (acute renal failure); Cholangitis; Duodenal ulcer; GI bleed; Anemia associated with acute blood loss; COPD (chronic obstructive pulmonary disease) with chronic bronchitis; and Dementia in Alzheimer's disease with delirium.   Presented with  Confusion this this AM. Family brought him from home. Patient is hard of hearing and not sure why he is here. Family states he has been extra sleepy today and was extra confused. No localized weakness. His baseline he is able to ambulate and take care of himself. EMS was called and found patient to be slightly hypotensive and diaphoretic.  Pateitn was initialy slightly hypoxic. Mental state improved with IV fluids but not back to baseline. He has hx of mild cough but has hx of COPD. Patient has dementia at baseline. In ER he was found febrile up to 101.5 Blood pressure was noted down to 90/50's, CXR and Urine showed no evidence of infection.   Review of Systems:   unable to obtain Past Medical History: Past Medical History  Diagnosis Date  . Chest discomfort   . Coronary artery disease   . Diabetes mellitus   . Hypertension   . Hyperlipidemia   . Obesities, morbid   . Gallstone pancreatitis     recurrent  . TIA (transient ischemic attack)   . Bacteremia due to vancomycin resistant Enterococcus   . GERD (gastroesophageal reflux disease)   . Hearing impairment   . Myocardial infarction, anterior wall,  subsequent care   . Second degree AV block, Mobitz type II     s/p PPM by JA 4/12  . ARF (acute renal failure)   . Cholangitis   . Duodenal ulcer   . GI bleed   . Anemia associated with acute blood loss   . 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 Dementia in Alzheimer's disease with delirium    Past Surgical History  Procedure Laterality Date  . Cholecystectomy    . Pacemaker placement  01/01/11    implanted by JA for mobitz II AV block  . Pancreatic stent       Medications: Prior to Admission medications   Medication Sig Start Date End Date Taking? Authorizing Provider  albuterol (PROVENTIL HFA;VENTOLIN HFA) 108 (90 BASE) MCG/ACT inhaler Inhale 2 puffs into the lungs every 4 (four) hours as needed. For shortness of breath   Yes Historical Provider, MD  aspirin 325 MG tablet Take 162.5 mg by mouth daily.    Yes Historical Provider, MD  budesonide-formoterol (SYMBICORT) 160-4.5 MCG/ACT inhaler Inhale 1 puff into the lungs 2 (two) times daily. 02/26/13  Yes Chesley Mires, MD  donepezil (ARICEPT) 10 MG tablet Take 1 tablet (10 mg total) by mouth daily at 8 pm. 08/06/13  Yes Dennie Bible, NP  folic acid (FOLVITE) 1 MG tablet Take 1 mg by mouth daily.   Yes Historical Provider, MD  hydrochlorothiazide (HYDRODIURIL) 25 MG tablet Take 12.5 mg by mouth daily.   Yes Historical Provider, MD  ipratropium-albuterol (DUONEB) 0.5-2.5 (3) MG/3ML SOLN Take 3 mLs by nebulization every 6 (six) hours as needed. For  shortness of breath   Yes Historical Provider, MD  losartan (COZAAR) 50 MG tablet Take 1 tablet (50 mg total) by mouth daily. 11/26/12  Yes [name], MD  MAGNESIUM SULFATE PO Take 1 tablet by mouth daily.   Yes Historical Provider, MD  Memantine HCl ER (NAMENDA XR) 28 MG CP24 Take 28 mg by mouth daily.    Yes Historical Provider, MD  metoprolol (LOPRESSOR) 50 MG tablet Take 1 tablet (50 mg total) by mouth 2 (two) times daily. 12/23/12  Yes [name],  MD  Multiple Vitamins-Minerals (CENTRUM SILVER PO) Take 1 tablet by mouth every morning.    Yes Historical Provider, MD  Omega-3 Fatty Acids (FISH OIL PO) Take 2 capsules by mouth daily.   Yes Historical Provider, MD  omeprazole (PRILOSEC OTC) 20 MG tablet Take 20 mg by mouth 2 (two) times daily.   Yes Historical Provider, MD  Polyethyl Glycol-Propyl Glycol (SYSTANE) 0.4-0.3 % SOLN Apply 1 drop to eye daily as needed (for redness).   Yes Historical Provider, MD  polyethylene glycol (MIRALAX / GLYCOLAX) packet Take 17 g by mouth daily.   Yes Historical Provider, MD  simvastatin (ZOCOR) 20 MG tablet Take 20 mg by mouth every evening.    Yes Historical Provider, MD  tiotropium (SPIRIVA) 18 MCG inhalation capsule Place 18 mcg into inhaler and inhale daily.   Yes Historical Provider, MD    Allergies:   Allergies  Allergen Reactions  . Doxycycline Other (See Comments)    Unknown- patient doesn't remember  . Penicillins Rash    Social History:  Ambulatory  independently  Lives at home with family   reports that he quit smoking about 17 years ago. His smoking use included Cigarettes. He has a 57 pack-year smoking history. He has never used smokeless tobacco. He reports that he does not drink alcohol or use illicit drugs.   Family History: family history includes Hypertension in his other.    Physical Exam: Patient Vitals for the past 24 hrs:  BP Temp Temp src Pulse Resp SpO2  12/04/13 1746 132/55 mmHg 98.5 F (36.9 C) Oral 109 18 99 %  12/04/13 1653 115/61 mmHg - - 113 23 95 %  12/04/13 1645 - - - 114 23 93 %  12/04/13 1640 125/64 mmHg - - 118 23 96 %  12/04/13 1630 - - - 117 23 95 %  12/04/13 1625 122/68 mmHg - - - - -  12/04/13 1615 - - - 120 - 97 %  12/04/13 1610 - 101.5 F (38.6 C) Rectal - - -  12/04/13 1549 - - - 122 - 89 %  12/04/13 1545 - - - 124 - 90 %  12/04/13 1530 - - - 123 25 93 %  12/04/13 1518 - - - - - 95 %  12/04/13 1505 135/71 mmHg 98.8 F (37.1 C) Oral 123  24 94 %    1. General:  in No Acute distress 2. Psychological: Alert but not Oriented 3. Head/ENT:   Moist   Mucous Membranes                          Head Non traumatic, neck supple                          Normal   Dentition 4. SKIN: decreased Skin turgor,  Skin clean Dry and intact no rash 5. Heart: Regular rate and rhythm  no Murmur, Rub or gallop 6. Lungs: occasional  wheezes coarse breath sounds.   7. Abdomen: Soft, non-tender, Non distended, obese 8. Lower extremities: no clubbing, cyanosis, or edema 9. Neurologically Grossly intact, moving all 4 extremities equally.  10. MSK: Normal range of motion  body mass index is unknown because there is no weight on file.   Labs on Admission:   Recent Labs  12/04/13 1511  NA 137  K 3.4*  CL 96  CO2 24  GLUCOSE 219*  BUN 13  CREATININE 1.29  CALCIUM 9.1    Recent Labs  12/04/13 1511  AST 21  ALT 14  ALKPHOS 111  BILITOT 0.7  PROT 6.6  ALBUMIN 3.3*   No results found for this basename: LIPASE, AMYLASE,  in the last 72 hours  Recent Labs  12/04/13 1511  WBC 16.0*  NEUTROABS 13.3*  HGB 14.6  HCT 43.0  MCV 94.5  PLT 195   No results found for this basename: CKTOTAL, CKMB, CKMBINDEX, TROPONINI,  in the last 72 hours No results found for this basename: TSH, T4TOTAL, FREET3, T3FREE, THYROIDAB,  in the last 72 hours No results found for this basename: VITAMINB12, FOLATE, FERRITIN, TIBC, IRON, RETICCTPCT,  in the last 72 hours Lab Results  Component Value Date   HGBA1C 6.4* 06/24/2012    The CrCl is unknown because both a height and weight (above a minimum accepted value) are required for this calculation. ABG    Component Value Date/Time   PHART 7.439 10/27/2012 2005   HCO3 23.6 10/27/2012 2005   TCO2 25 10/27/2012 2005   ACIDBASEDEF 1.8 01/11/2008 0828   O2SAT 92.0 10/27/2012 2005     Lab Results  Component Value Date   DDIMER  Value: 1.86        AT THE INHOUSE ESTABLISHED CUTOFF VALUE OF 0.48 ug/mL FEU, THIS  ASSAY HAS BEEN DOCUMENTED IN THE LITERATURE TO HAVE A SENSITIVITY AND NEGATIVE PREDICTIVE VALUE OF AT LEAST 98 TO 99%.  THE TEST RESULT SHOULD BE CORRELATED WITH AN ASSESSMENT OF THE CLINICAL PROBABILITY OF DVT / VTE.* 05/03/2010     Other results:  I have pearsonaly reviewed this: ECG REPORT  Rate: 120  Rhythm: Sinus tachy with PVC's ST&T Change: no ischemic changes  UA no evidence of UTI    Cultures:    Component Value Date/Time   SDES BLOOD LEFT HAND 05/22/2011 1404   SPECREQUEST BOTTLES DRAWN AEROBIC ONLY 10CC 05/22/2011 1404   CULT NO GROWTH 5 DAYS 05/22/2011 1404   REPTSTATUS 05/28/2011 FINAL 05/22/2011 1404       Radiological Exams on Admission: Ct Head Wo Contrast  12/04/2013   CLINICAL DATA:  Altered mental status, prior transient ischemic attack  EXAM: CT HEAD WITHOUT CONTRAST  TECHNIQUE: Contiguous axial images were obtained from the base of the skull through the vertex without intravenous contrast.  COMPARISON:  DG CERVICAL SPINE 2-3V dated 10/23/2011; CT HEAD W/O CM dated 05/11/2011  FINDINGS: Mild cortical volume loss noted with proportional ventricular prominence. Areas of periventricular white matter hypodensity are most compatible with small vessel ischemic change. No acute hemorrhage, infarct, or mass lesion is identified. Mild pansinusitis is noted with mucoperiosteal thickening. Mild motion artifact is noted at the skull base. No skull fracture. Orbits are grossly unremarkable. Right external capsule lacunar infarct reidentified.  IMPRESSION: No acute intracranial abnormality.  Chronic findings as above.   Electronically Signed   By: Conchita Paris M.D.   On: 12/04/2013 16:41   Dg Chest  Port 1 View  12/04/2013   CLINICAL DATA:  Shortness of breath, fatigue, altered mental status, history hypertension, coronary artery disease, diabetes, COPD  EXAM: PORTABLE CHEST - 1 VIEW  COMPARISON:  Portable exam 1600 hr compared to 10/29/2012.  FINDINGS: Left subclavian transvenous  pacemaker leads project over right atrium right ventricle.  Normal heart size, mediastinal contours and pulmonary vascularity.  Lungs clear.  No pleural effusion or pneumothorax.  Bones demineralized.  IMPRESSION: No acute abnormalities.   Electronically Signed   By: Lavonia Dana M.D.   On: 12/04/2013 17:06    Chart has been reviewed  Assessment/Plan  80 yo with hx of dementia here with worsening confusion and fever with evidence of hypotension  Present on Admission:  . Sepsis - IV fluids, IV antibiotics, blood and urine cultures, admit to stepdown for further monitoring given hypotension, broaden antibiotic coverage. For now on vanc, ceftriaxon and azythromycine. Sourse unclear will repeat CXR to see if he has evidence of PNA after rehydration given pulmonary complaints, test for influenza cover with tamiflu for now . Coronary artery disease - cycle CE, serial ECG, repeat echo to evaluate heart function . DM2 (diabetes mellitus, type 2) - ssi . COPD with emphysema - minimal weezing, hold off on steroid for now, will give nebulizers as needed, repeat cXR in AM . Dementia with behavioral disturbance - acute delirium on chronic dementia in the setting of sepsis and febrile illness. Mental status seems to improve with symptom control and rehydration Hx of CHF - last echo from 2013 showed preserved EF, no on diauretics at home, will repeat echo and monitor to avoid fluid overloading  Prophylaxis:  Lovenox, Protonix  CODE STATUS: DNR/DNI as per family, no interested in aggressive measures  Other plan as per orders.  I have spent a total of 55 min on this admission  Yasmen Cortner 12/04/2013, 7:45 PM

## 2013-12-04 NOTE — ED Notes (Signed)
D[place], MD came out of room stating that the pt was hypotensive. This RN started a 500 mL bolus and placed the pt in Trendelenburg. Pt also given facial tissue and an emesis bag so he can spit mucous when he coughs it up. Sputum is white, frothy, and tenacious.

## 2013-12-04 NOTE — ED Provider Notes (Signed)
Ultrasound Guided Angiocath insertion Performed by: Sol Passer  Consent: Verbal consent unable to be obtained as patient acutely altered Risks and benefits: risks outweight benefits as patient felt to be septic and needs appropriate IV access.  Time out: Immediately prior to procedure a "time out" was called to verify the correct patient, procedure, equipment, support staff and site/side marked as required. Preparation: Patient was prepped and draped in the usual sterile fashion. Vein Location: L AC Was Ultrasound Guided Gauge: 20 g Normal blood return and flush without difficulty Patient tolerance: Patient tolerated the procedure well with no immediate complications.   Ultrasound Guided Angiocath insertion Performed by: Sol Passer  Consent: Verbal consent unable to be obtained as patient acutely altered Risks and benefits: risks outweight benefits as patient felt to be septic and needs appropriate IV access.  Time out: Immediately prior to procedure a "time out" was called to verify the correct patient, procedure, equipment, support staff and site/side marked as required. Preparation: Patient was prepped and draped in the usual sterile fashion. Vein Location: R AC Was Ultrasound Guided Gauge: 18 g Normal blood return and flush without difficulty Patient tolerance: Patient tolerated the procedure well with no immediate complications.     Sol Passer, MD 12/04/13 [phone]

## 2013-12-04 NOTE — ED Notes (Signed)
Pt arrives via EMS from home with sudden onset of weakness and "just not acting like himself." Ems reports pt was pale, diaphoretic, hypotensive on arrival. CBG 220. 24g right wrist. Pt with rales and expiratory wheezes. Placed on monitor.

## 2013-12-04 NOTE — ED Notes (Signed)
Pt's wife took home belongings.

## 2013-12-05 ENCOUNTER — Inpatient Hospital Stay (HOSPITAL_COMMUNITY): Payer: Medicare Other

## 2013-12-05 DIAGNOSIS — J209 Acute bronchitis, unspecified: Secondary | ICD-10-CM

## 2013-12-05 DIAGNOSIS — D72829 Elevated white blood cell count, unspecified: Secondary | ICD-10-CM

## 2013-12-05 LAB — COMPREHENSIVE METABOLIC PANEL
ALK PHOS: 89 U/L (ref 39–117)
ALT: 10 U/L (ref 0–53)
AST: 19 U/L (ref 0–37)
Albumin: 2.5 g/dL — ABNORMAL LOW (ref 3.5–5.2)
BILIRUBIN TOTAL: 0.3 mg/dL (ref 0.3–1.2)
BUN: 12 mg/dL (ref 6–23)
CHLORIDE: 106 meq/L (ref 96–112)
CO2: 23 meq/L (ref 19–32)
Calcium: 8.1 mg/dL — ABNORMAL LOW (ref 8.4–10.5)
Creatinine, Ser: 1.25 mg/dL (ref 0.50–1.35)
GFR calc Af Amer: 59 mL/min — ABNORMAL LOW (ref >90)
GFR calc non Af Amer: 51 mL/min — ABNORMAL LOW (ref >90)
Glucose, Bld: 197 mg/dL — ABNORMAL HIGH (ref 70–99)
Potassium: 3.8 mEq/L (ref 3.7–5.3)
Sodium: 142 mEq/L (ref 137–147)
Total Protein: 5.3 g/dL — ABNORMAL LOW (ref 6.0–8.3)

## 2013-12-05 LAB — GLUCOSE, CAPILLARY
GLUCOSE-CAPILLARY: 165 mg/dL — AB (ref 70–99)
GLUCOSE-CAPILLARY: 162 mg/dL — AB (ref 70–99)
Glucose-Capillary: 156 mg/dL — ABNORMAL HIGH (ref 70–99)
Glucose-Capillary: 179 mg/dL — ABNORMAL HIGH (ref 70–99)

## 2013-12-05 LAB — CBC
HCT: 35.5 % — ABNORMAL LOW (ref 39.0–52.0)
Hemoglobin: 11.7 g/dL — ABNORMAL LOW (ref 13.0–17.0)
MCH: 31.7 pg (ref 26.0–34.0)
MCHC: 33 g/dL (ref 30.0–36.0)
MCV: 96.2 fL (ref 78.0–100.0)
PLATELETS: 165 10*3/uL (ref 150–400)
RBC: 3.69 MIL/uL — AB (ref 4.22–5.81)
RDW: 14.2 % (ref 11.5–15.5)
WBC: 7 10*3/uL (ref 4.0–10.5)

## 2013-12-05 LAB — EXPECTORATED SPUTUM ASSESSMENT W REFEX TO RESP CULTURE: SPECIAL REQUESTS: NORMAL

## 2013-12-05 LAB — URINE CULTURE

## 2013-12-05 LAB — INFLUENZA PANEL BY PCR (TYPE A & B)
H1N1FLUPCR: NOT DETECTED
INFLBPCR: NEGATIVE
Influenza A By PCR: NEGATIVE

## 2013-12-05 LAB — PROCALCITONIN: Procalcitonin: 0.62 ng/mL

## 2013-12-05 LAB — PHOSPHORUS: PHOSPHORUS: 2.2 mg/dL — AB (ref 2.3–4.6)

## 2013-12-05 LAB — MRSA PCR SCREENING: MRSA by PCR: NEGATIVE

## 2013-12-05 LAB — MAGNESIUM: Magnesium: 1.7 mg/dL (ref 1.5–2.5)

## 2013-12-05 LAB — TSH: TSH: 2.538 u[IU]/mL (ref 0.350–4.500)

## 2013-12-05 LAB — LACTIC ACID, PLASMA: LACTIC ACID, VENOUS: 1.3 mmol/L (ref 0.5–2.2)

## 2013-12-05 LAB — EXPECTORATED SPUTUM ASSESSMENT W GRAM STAIN, RFLX TO RESP C

## 2013-12-05 LAB — TROPONIN I
Troponin I: <0.3 ng/mL (ref <0.30)
Troponin I: <0.3 ng/mL (ref <0.30)

## 2013-12-05 LAB — HEMOGLOBIN A1C
Hgb A1c MFr Bld: 7.6 % — ABNORMAL HIGH (ref <5.7)
Mean Plasma Glucose: 171 mg/dL — ABNORMAL HIGH (ref <117)

## 2013-12-05 MED ORDER — IPRATROPIUM-ALBUTEROL 0.5-2.5 (3) MG/3ML IN SOLN
3.0000 mL | Freq: Four times a day (QID) | RESPIRATORY_TRACT | Status: DC
  Administered 2013-12-05 – 2013-12-07 (×11): 3 mL via RESPIRATORY_TRACT
  Filled 2013-12-05 (×13): qty 3

## 2013-12-05 MED ORDER — METOPROLOL TARTRATE 12.5 MG HALF TABLET
12.5000 mg | ORAL_TABLET | Freq: Two times a day (BID) | ORAL | Status: DC
  Administered 2013-12-05 – 2013-12-06 (×4): 12.5 mg via ORAL
  Filled 2013-12-05 (×6): qty 1

## 2013-12-05 MED ORDER — ASPIRIN 325 MG PO TABS: 162.5000 mg | ORAL_TABLET | Freq: Every day | ORAL | Status: DC

## 2013-12-05 NOTE — Progress Notes (Signed)
[facility] Progress Note [place] TEAM 1 - Stepdown/ICU TEAM   ROLEN CONGER EQA:[id number] DOB: 1926/12/15 DOA: 12/04/2013 PCP: Stephens Shire, MD  Brief narrative: [name] is a 80 y.o. male presenting on 12/04/2013 with  has a past medical history of  Coronary artery disease; Diabetes mellitus; Hypertension; Hyperlipidemia; Obesity, morbid; Gallstone pancreatitis; TIA (transient ischemic attack); Bacteremia due to vancomycin resistant Enterococcus; GERD (gastroesophageal reflux disease); Hearing impairment; Myocardial infarction, anterior wall, subsequent care; Second degree AV block, Mobitz type II; ARF (acute renal failure); Cholangitis; Duodenal ulcer; GI bleed; Anemia associated with acute blood loss; COPD (chronic obstructive pulmonary disease) with chronic bronchitis; and 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 Family brought him from home. Patient is hard of hearing and not sure why he is here. Family states he has been extra sleepy and the day of admission and was extra confused. No localized weakness. His baseline he is able to ambulate and take care of himself. EMS was called and found patient to be slightly hypotensive, diaphoretic and hypoxic. Mental state improved with IV fluids but not back to baseline. He has hx of mild cough but has hx of COPD. Patient has dementia at baseline. In ER he was found febrile up to 101.5  Blood pressure was noted down to 90/50's, CXR and Urine showed no evidence of infection.  Subjective: No complaints. Very hard of hearing.   Assessment/Plan: Principal Problem:   Sepsis/ Lactic acidosis - acute bronchitis vs PNA - Influenza negative- D/C Tamiflu - cont Zithro and Rocephin  Active Problems:  Hypotension - holding Cozaar- resume Metoprolol at 12.5 mg BID for now    Coronary artery disease - cont ASA    DM2 (diabetes mellitus, type 2) - Novolog SSI    Systolic CHF, chronic (grade 1  DD) - compensated    COPD with emphysema - stable    Pacemaker-[employer]    Dementia with behavioral disturbance - currently quite oriented    Chronic renal insufficiency, stage II (mild)    Code Status: DNr Family Communication: none Disposition Plan: PT eval  Consultants: none  Procedures: none  Antibiotics: Anti-infectives   Start     Dose/Rate Route Frequency Ordered Stop   12/05/13 1800  azithromycin (ZITHROMAX) 500 mg in dextrose 5 % 250 mL IVPB     500 mg 250 mL/hr over 60 Minutes Intravenous Every 24 hours 12/04/13 2230     12/05/13 1700  cefTRIAXone (ROCEPHIN) 1 g in dextrose 5 % 50 mL IVPB     1 g 100 mL/hr over 30 Minutes Intravenous Every 24 hours 12/04/13 2230     12/05/13 0000  vancomycin (VANCOCIN) 1,250 mg in sodium chloride 0.9 % 250 mL IVPB     1,250 mg 166.7 mL/hr over 90 Minutes Intravenous Every 24 hours 12/04/13 2248     12/04/13 2230  oseltamivir (TAMIFLU) capsule 30 mg  Status:  Discontinued     30 mg Oral 2 times daily 12/04/13 2229 12/05/13 0859   12/04/13 1700  cefTRIAXone (ROCEPHIN) 1 g in dextrose 5 % 50 mL IVPB     1 g 100 mL/hr over 30 Minutes Intravenous  Once 12/04/13 1655 12/04/13 1820   12/04/13 1700  azithromycin (ZITHROMAX) 500 mg in dextrose 5 % 250 mL IVPB     500 mg 250 mL/hr over 60 Minutes Intravenous  Once 12/04/13 1655 12/04/13 1850       DVT prophylaxis: Lovenox  Objective: Filed Weights   12/04/13  2145  Weight: 95.2 kg (209 lb 14.1 oz)   Blood pressure 145/67, pulse 83, temperature 98.2 F (36.8 C), temperature source Oral, resp. rate 17, height 5\' 6"  (1.676 m), weight 95.2 kg (209 lb 14.1 oz), SpO2 99.00%.  Intake/Output Summary (Last 24 hours) at 12/05/13 1111 Last data filed at 12/05/13 0800  Gross per 24 hour  Intake 1904.17 ml  Output    450 ml  Net 1454.17 ml     Exam: General: Morbidly obese, AAO x 3 No acute respiratory distress Lungs: No wheezes or crackles- severe rhonchi Cardiovascular:  Regular rate and rhythm without murmur gallop or rub normal S1 and S2 Abdomen: Nontender, nondistended, soft, bowel sounds positive, no rebound, no ascites, no appreciable mass Extremities: No significant cyanosis, clubbing, or edema bilateral lower extremities  Data Reviewed: Basic Metabolic Panel:  Recent Labs Lab 12/04/13 1511  NA 137  K 3.4*  CL 96  CO2 24  GLUCOSE 219*  BUN 13  CREATININE 1.29  CALCIUM 9.1   Liver Function Tests:  Recent Labs Lab 12/04/13 1511  AST 21  ALT 14  ALKPHOS 111  BILITOT 0.7  PROT 6.6  ALBUMIN 3.3*   No results found for this basename: LIPASE, AMYLASE,  in the last 168 hours No results found for this basename: AMMONIA,  in the last 168 hours CBC:  Recent Labs Lab 12/04/13 1511  WBC 16.0*  NEUTROABS 13.3*  HGB 14.6  HCT 43.0  MCV 94.5  PLT 195   Cardiac Enzymes:  Recent Labs Lab 12/04/13 2348  TROPONINI <0.30   BNP (last 3 results) No results found for this basename: PROBNP,  in the last 8760 hours CBG:  Recent Labs Lab 12/04/13 2320 12/05/13 0806  GLUCAP 140* 156*    Recent Results (from the past 240 hour(s))  MRSA PCR SCREENING     Status: None   Collection Time    12/04/13 10:35 PM      Result Value Ref Range Status   MRSA by PCR NEGATIVE  NEGATIVE Final   Comment:            The GeneXpert MRSA Assay (FDA     approved for NASAL specimens     only), is one component of a     comprehensive MRSA colonization     surveillance program. It is not     intended to diagnose MRSA     infection nor to guide or     monitor treatment for     MRSA infections.     Studies:  Recent x-ray studies have been reviewed in detail by the Attending Physician  Scheduled Meds:  Scheduled Meds: . aspirin  162 mg Oral Daily  . azithromycin  500 mg Intravenous Q24H  . budesonide-formoterol  1 puff Inhalation BID  . cefTRIAXone (ROCEPHIN)  IV  1 g Intravenous Q24H  . docusate sodium  100 mg Oral BID  . donepezil  10 mg  Oral Q2000  . enoxaparin (LOVENOX) injection  40 mg Subcutaneous Q24H  . guaiFENesin  600 mg Oral BID  . insulin aspart  0-15 Units Subcutaneous TID WC  . insulin aspart  0-5 Units Subcutaneous QHS  . ipratropium-albuterol  3 mL Nebulization QID  . Memantine HCl ER  28 mg Oral Daily  . pantoprazole  40 mg Oral BID  . senna  1 tablet Oral BID  . simvastatin  20 mg Oral QPM  . sodium chloride  3 mL Intravenous Q12H  . vancomycin  1,250 mg Intravenous Q24H   Continuous Infusions:   Time spent on care of this patient: >35 min   Debbe Odea, MD  [facility] Office  [phone] Pager - Text Page per Shea Evans as per below:  On-Call/Text Page:      [email or web address]  If 7PM-7AM, please contact night-coverage [email or web address] 12/05/2013, 11:11 AM   LOS: 1 day

## 2013-12-06 DIAGNOSIS — J438 Other emphysema: Secondary | ICD-10-CM

## 2013-12-06 DIAGNOSIS — R0902 Hypoxemia: Secondary | ICD-10-CM

## 2013-12-06 LAB — BASIC METABOLIC PANEL
BUN: 17 mg/dL (ref 6–23)
CALCIUM: 8.7 mg/dL (ref 8.4–10.5)
CO2: 24 meq/L (ref 19–32)
CREATININE: 1.31 mg/dL (ref 0.50–1.35)
Chloride: 109 mEq/L (ref 96–112)
GFR calc non Af Amer: 48 mL/min — ABNORMAL LOW (ref >90)
GFR, EST AFRICAN AMERICAN: 56 mL/min — AB (ref >90)
Glucose, Bld: 178 mg/dL — ABNORMAL HIGH (ref 70–99)
Potassium: 3.8 mEq/L (ref 3.7–5.3)
Sodium: 144 mEq/L (ref 137–147)

## 2013-12-06 LAB — GLUCOSE, CAPILLARY
GLUCOSE-CAPILLARY: 183 mg/dL — AB (ref 70–99)
Glucose-Capillary: 126 mg/dL — ABNORMAL HIGH (ref 70–99)
Glucose-Capillary: 228 mg/dL — ABNORMAL HIGH (ref 70–99)
Glucose-Capillary: 161 mg/dL — ABNORMAL HIGH (ref 70–99)

## 2013-12-06 LAB — CBC
HEMATOCRIT: 35.4 % — AB (ref 39.0–52.0)
Hemoglobin: 11.5 g/dL — ABNORMAL LOW (ref 13.0–17.0)
MCH: 31.3 pg (ref 26.0–34.0)
MCHC: 32.5 g/dL (ref 30.0–36.0)
MCV: 96.2 fL (ref 78.0–100.0)
Platelets: 178 10*3/uL (ref 150–400)
RBC: 3.68 MIL/uL — ABNORMAL LOW (ref 4.22–5.81)
RDW: 14 % (ref 11.5–15.5)
WBC: 7.2 10*3/uL (ref 4.0–10.5)

## 2013-12-06 LAB — TROPONIN I

## 2013-12-06 LAB — PROCALCITONIN: PROCALCITONIN: 0.17 ng/mL

## 2013-12-06 MED ORDER — AZITHROMYCIN 500 MG PO TABS
500.0000 mg | ORAL_TABLET | Freq: Every day | ORAL | Status: DC
  Administered 2013-12-06 – 2013-12-07 (×2): 500 mg via ORAL
  Filled 2013-12-06 (×2): qty 1

## 2013-12-06 NOTE — Progress Notes (Signed)
Nurse attempted to ambulate pt with the assistance of gait belt and PRN wheelchair  in hall as ordered.  Pt was unable to tolerate ambulation post five feet from bed, pt stated " Im about to fall " and displayed very unsteady gait.  Pt was directed via gait belt back to bed.  Pt is resting comfortably and nurse will continue to monitor.  MD informed of event

## 2013-12-06 NOTE — Progress Notes (Signed)
[facility] Progress Note  TEAM 1 - Stepdown/ICU TEAM   [name] VZC:[id number] DOB: 01/18/1927 DOA: 12/04/2013 PCP: Stephens Shire, MD  Brief narrative: [name] is a 80 y.o. male presenting on 12/04/2013 with  has a past medical history of  Coronary artery disease; Diabetes mellitus; Hypertension; Hyperlipidemia; Obesity, morbid; Gallstone pancreatitis; TIA (transient ischemic attack); Bacteremia due to vancomycin resistant Enterococcus; GERD (gastroesophageal reflux disease); Hearing impairment; Myocardial infarction, anterior wall, subsequent care; Second degree AV block, Mobitz type II; ARF (acute renal failure); Cholangitis; Duodenal ulcer; GI bleed; Anemia associated with acute blood loss; COPD (chronic obstructive pulmonary disease) with chronic bronchitis; and 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 Family brought him from home. Patient is hard of hearing and not sure why he is here. Family states he has been extra sleepy and the day of admission and was extra confused. No localized weakness. His baseline he is able to ambulate and take care of himself. EMS was called and found patient to be slightly hypotensive, diaphoretic and hypoxic. Mental state improved with IV fluids but not back to baseline. He has hx of mild cough but has hx of COPD. Patient has dementia at baseline. In ER he was found febrile up to 101.5  Blood pressure was noted down to 90/50's, CXR and Urine showed no evidence of infection.  Subjective: No complaints.   Assessment/Plan: Principal Problem:   Sepsis/ Lactic acidosis - acute bronchitis  - Influenza negative- D/C Tamiflu - cont Zithro - d/c Rocephin today- tx to floor  Active Problems:  Hypotension - holding Cozaar- resumed Metoprolol at 12.5 mg BID - increase to 25 BID    Coronary artery disease - cont ASA    DM2 (diabetes mellitus, type 2) - Novolog SSI    Systolic CHF, chronic (grade  1 DD) - compensated    COPD with emphysema - stable    Pacemaker-St.Jude    Dementia with behavioral disturbance - currently quite oriented    Chronic renal insufficiency, stage II (mild)    Code Status: DNR Family Communication: none Disposition Plan: PT eval- ambulate  Consultants: none  Procedures: none  Antibiotics: Anti-infectives   Start     Dose/Rate Route Frequency Ordered Stop   12/06/13 1800  azithromycin (ZITHROMAX) tablet 500 mg     500 mg Oral Daily 12/06/13 1200     12/05/13 1800  azithromycin (ZITHROMAX) 500 mg in dextrose 5 % 250 mL IVPB  Status:  Discontinued     500 mg 250 mL/hr over 60 Minutes Intravenous Every 24 hours 12/04/13 2230 12/06/13 1200   12/05/13 1700  cefTRIAXone (ROCEPHIN) 1 g in dextrose 5 % 50 mL IVPB     1 g 100 mL/hr over 30 Minutes Intravenous Every 24 hours 12/04/13 2230     12/05/13 0000  vancomycin (VANCOCIN) 1,250 mg in sodium chloride 0.9 % 250 mL IVPB  Status:  Discontinued     1,250 mg 166.7 mL/hr over 90 Minutes Intravenous Every 24 hours 12/04/13 2248 12/05/13 1118   12/04/13 2230  oseltamivir (TAMIFLU) capsule 30 mg  Status:  Discontinued     30 mg Oral 2 times daily 12/04/13 2229 12/05/13 0859   12/04/13 1700  cefTRIAXone (ROCEPHIN) 1 g in dextrose 5 % 50 mL IVPB     1 g 100 mL/hr over 30 Minutes Intravenous  Once 12/04/13 1655 12/04/13 1820   12/04/13 1700  azithromycin (ZITHROMAX) 500 mg in dextrose 5 % 250 mL IVPB  500 mg 250 mL/hr over 60 Minutes Intravenous  Once 12/04/13 1655 12/04/13 1850       DVT prophylaxis: Lovenox  Objective: Filed Weights   12/04/13 2145  Weight: 95.2 kg (209 lb 14.1 oz)   Blood pressure 126/73, pulse 82, temperature 98.1 F (36.7 C), temperature source Oral, resp. rate 17, height 5\' 6"  (1.676 m), weight 95.2 kg (209 lb 14.1 oz), SpO2 95.00%.  Intake/Output Summary (Last 24 hours) at 12/06/13 1422 Last data filed at 12/06/13 1300  Gross per 24 hour  Intake    663 ml   Output   1100 ml  Net   -437 ml     Exam: General: Morbidly obese, AAO x 3 No acute respiratory distress Lungs: No wheezes or crackles- severe rhonchi Cardiovascular: Regular rate and rhythm without murmur gallop or rub normal S1 and S2 Abdomen: Nontender, nondistended, soft, bowel sounds positive, no rebound, no ascites, no appreciable mass Extremities: No significant cyanosis, clubbing, or edema bilateral lower extremities  Data Reviewed: Basic Metabolic Panel:  Recent Labs Lab 12/04/13 1511 12/05/13 1537  NA 137 142  K 3.4* 3.8  CL 96 106  CO2 24 23  GLUCOSE 219* 197*  BUN 13 12  CREATININE 1.29 1.25  CALCIUM 9.1 8.1*  MG  --  1.7  PHOS  --  2.2*   Liver Function Tests:  Recent Labs Lab 12/04/13 1511 12/05/13 1537  AST 21 19  ALT 14 10  ALKPHOS 111 89  BILITOT 0.7 0.3  PROT 6.6 5.3*  ALBUMIN 3.3* 2.5*   No results found for this basename: LIPASE, AMYLASE,  in the last 168 hours No results found for this basename: AMMONIA,  in the last 168 hours CBC:  Recent Labs Lab 12/04/13 1511 12/05/13 1537  WBC 16.0* 7.0  NEUTROABS 13.3*  --   HGB 14.6 11.7*  HCT 43.0 35.5*  MCV 94.5 96.2  PLT 195 165   Cardiac Enzymes:  Recent Labs Lab 12/04/13 2348 12/05/13 1537 12/05/13 2250  TROPONINI <0.30 <0.30 <0.30   BNP (last 3 results) No results found for this basename: PROBNP,  in the last 8760 hours CBG:  Recent Labs Lab 12/05/13 1253 12/05/13 1727 12/05/13 2210 12/06/13 0823 12/06/13 1202  GLUCAP 165* 179* 162* 183* 126*    Recent Results (from the past 240 hour(s))  CULTURE, BLOOD (ROUTINE X 2)     Status: None   Collection Time    12/04/13  3:54 PM      Result Value Ref Range Status   Specimen Description BLOOD ARM LEFT   Final   Special Requests BOTTLES DRAWN AEROBIC AND ANAEROBIC 5CC   Final   Culture  Setup Time     Final   Value: 12/04/2013 20:43     Performed at [facility]   Culture     Final   Value:        BLOOD  CULTURE RECEIVED NO GROWTH TO DATE CULTURE WILL BE HELD FOR 5 DAYS BEFORE ISSUING A FINAL NEGATIVE REPORT     Performed at [facility]   Report Status PENDING   Incomplete  CULTURE, BLOOD (ROUTINE X 2)     Status: None   Collection Time    12/04/13  3:58 PM      Result Value Ref Range Status   Specimen Description BLOOD WRIST RIGHT   Final   Special Requests BOTTLES DRAWN AEROBIC ONLY 5CC   Final   Culture  Setup Time  Final   Value: 12/04/2013 20:44     Performed at [facility]   Culture     Final   Value:        BLOOD CULTURE RECEIVED NO GROWTH TO DATE CULTURE WILL BE HELD FOR 5 DAYS BEFORE ISSUING A FINAL NEGATIVE REPORT     Performed at [facility]   Report Status PENDING   Incomplete  URINE CULTURE     Status: None   Collection Time    12/04/13  5:27 PM      Result Value Ref Range Status   Specimen Description URINE, RANDOM   Final   Special Requests NONE   Final   Culture  Setup Time     Final   Value: 12/04/2013 19:06     Performed at [facility] Count     Final   Value: 45,000 COLONIES/ML     Performed at [facility]   Culture     Final   Value: Multiple bacterial morphotypes present, none predominant. Suggest appropriate recollection if clinically indicated.     Performed at [facility]   Report Status 12/05/2013 FINAL   Final  MRSA PCR SCREENING     Status: None   Collection Time    12/04/13 10:35 PM      Result Value Ref Range Status   MRSA by PCR NEGATIVE  NEGATIVE Final   Comment:            The GeneXpert MRSA Assay (FDA     approved for NASAL specimens     only), is one component of a     comprehensive MRSA colonization     surveillance program. It is not     intended to diagnose MRSA     infection nor to guide or     monitor treatment for     MRSA infections.  CULTURE, EXPECTORATED SPUTUM-ASSESSMENT     Status: None   Collection Time    12/05/13  3:26 PM      Result Value Ref Range  Status   Specimen Description SPUTUM   Final   Special Requests Normal   Final   Sputum evaluation     Final   Value: THIS SPECIMEN IS ACCEPTABLE. RESPIRATORY CULTURE REPORT TO FOLLOW.   Report Status 12/05/2013 FINAL   Final  CULTURE, RESPIRATORY (NON-EXPECTORATED)     Status: None   Collection Time    12/05/13  3:26 PM      Result Value Ref Range Status   Specimen Description SPUTUM   Final   Special Requests NONE   Final   Gram Stain PENDING   Incomplete   Culture     Final   Value: Culture reincubated for better growth     Performed at [facility]   Report Status PENDING   Incomplete     Studies:  Recent x-ray studies have been reviewed in detail by the Attending Physician  Scheduled Meds:  Scheduled Meds: . aspirin  162 mg Oral Daily  . azithromycin  500 mg Oral Daily  . budesonide-formoterol  1 puff Inhalation BID  . cefTRIAXone (ROCEPHIN)  IV  1 g Intravenous Q24H  . docusate sodium  100 mg Oral BID  . donepezil  10 mg Oral Q2000  . enoxaparin (LOVENOX) injection  40 mg Subcutaneous Q24H  . guaiFENesin  600 mg Oral BID  . insulin aspart  0-15 Units Subcutaneous TID WC  . insulin  aspart  0-5 Units Subcutaneous QHS  . ipratropium-albuterol  3 mL Nebulization QID  . Memantine HCl ER  28 mg Oral Daily  . metoprolol tartrate  12.5 mg Oral BID  . pantoprazole  40 mg Oral BID  . senna  1 tablet Oral BID  . simvastatin  20 mg Oral QPM  . sodium chloride  3 mL Intravenous Q12H   Continuous Infusions:   Time spent on care of this patient: 25 min   Debbe Odea, MD  [facility] Office  [phone] Pager - Text Page per Shea Evans as per below:  On-Call/Text Page:      [email or web address]  If 7PM-7AM, please contact night-coverage [email or web address] 12/06/2013, 2:22 PM   LOS: 2 days

## 2013-12-06 NOTE — ED Provider Notes (Signed)
I was present for and supervised the two ultrasound guided IV's by the resident.  Ephraim Hamburger, MD 12/06/13 [phone]

## 2013-12-07 ENCOUNTER — Inpatient Hospital Stay (HOSPITAL_COMMUNITY): Payer: Medicare Other

## 2013-12-07 ENCOUNTER — Encounter (HOSPITAL_COMMUNITY): Payer: Self-pay | Admitting: Radiology

## 2013-12-07 DIAGNOSIS — J9600 Acute respiratory failure, unspecified whether with hypoxia or hypercapnia: Secondary | ICD-10-CM

## 2013-12-07 DIAGNOSIS — J9601 Acute respiratory failure with hypoxia: Secondary | ICD-10-CM | POA: Diagnosis present

## 2013-12-07 LAB — GLUCOSE, CAPILLARY
GLUCOSE-CAPILLARY: 136 mg/dL — AB (ref 70–99)
GLUCOSE-CAPILLARY: 139 mg/dL — AB (ref 70–99)
Glucose-Capillary: 118 mg/dL — ABNORMAL HIGH (ref 70–99)

## 2013-12-07 MED ORDER — MIDAZOLAM HCL 5 MG/ML IJ SOLN
INTRAMUSCULAR | Status: AC
  Filled 2013-12-07: qty 2

## 2013-12-07 MED ORDER — PERFLUTREN LIPID MICROSPHERE
INTRAVENOUS | Status: AC
  Filled 2013-12-07: qty 10

## 2013-12-07 MED ORDER — LOSARTAN POTASSIUM 50 MG PO TABS
50.0000 mg | ORAL_TABLET | Freq: Every day | ORAL | Status: DC
  Administered 2013-12-07: 50 mg via ORAL
  Filled 2013-12-07 (×2): qty 1

## 2013-12-07 MED ORDER — AZITHROMYCIN 500 MG PO TABS: 500.0000 mg | ORAL_TABLET | Freq: Every day | ORAL | Status: DC

## 2013-12-07 MED ORDER — DSS 100 MG PO CAPS: 100.0000 mg | ORAL_CAPSULE | Freq: Two times a day (BID) | ORAL | Status: DC

## 2013-12-07 MED ORDER — METOPROLOL TARTRATE 50 MG PO TABS
50.0000 mg | ORAL_TABLET | Freq: Two times a day (BID) | ORAL | Status: DC
  Administered 2013-12-07: 50 mg via ORAL
  Filled 2013-12-07 (×3): qty 1

## 2013-12-07 MED ORDER — IOHEXOL 350 MG/ML SOLN
100.0000 mL | Freq: Once | INTRAVENOUS | Status: AC | PRN
  Administered 2013-12-07: 100 mL via INTRAVENOUS

## 2013-12-07 MED ORDER — FENTANYL CITRATE 0.05 MG/ML IJ SOLN
INTRAMUSCULAR | Status: AC
  Filled 2013-12-07: qty 2

## 2013-12-07 NOTE — Progress Notes (Signed)
PtSaats 93% R.A. When OOB in chair. Sats >90% walking 300 feet in hallway (Room air). Wife states Pt is at his base line.

## 2013-12-07 NOTE — Discharge Summary (Addendum)
Physician Discharge Summary  [name] UMP:[id number] DOB: 03-Feb-1927 DOA: 12/04/2013  PCP: Delorse Lek, MD  Admit date: 12/04/2013 Discharge date: 12/07/2013  Time spent: >45 minutes  Recommendations for Outpatient Follow-up:  1. Pt declined SNF- and HHPT and OT- 2. lung nodule- need f/u in 6-12 mo    Discharge Diagnoses:  Principal Problem:   Sepsis--  Acute bronchitis Active Problems:   Acute respiratory failure with hypoxia   Coronary artery disease   DM2 (diabetes mellitus, type 2)   Systolic CHF, chronic   COPD with emphysema   Pacemaker-St.Jude   Dementia with behavioral disturbance   Chronic renal insufficiency, stage II (mild)  lung nodule- need f/u in 6-12 mo    Discharge Condition: stable  Diet recommendation: heart healthy   Filed Weights   12/04/13 2145 12/07/13 0427  Weight: 95.2 kg (209 lb 14.1 oz) 94.1 kg (207 lb 7.3 oz)    History of present illness:  [name] is a 80 y.o. male presenting on 12/04/2013 with has a past medical history of Coronary artery disease; Diabetes mellitus; Hypertension; Hyperlipidemia; Obesity, morbid; Gallstone pancreatitis; TIA (transient ischemic attack); Bacteremia due to vancomycin resistant Enterococcus; GERD (gastroesophageal reflux disease); Hearing impairment; Myocardial infarction, anterior wall, subsequent care; Second degree AV block, Mobitz type II; ARF (acute renal failure); Cholangitis; Duodenal ulcer; GI bleed; Anemia associated with acute blood loss; COPD (chronic obstructive pulmonary disease) with chronic bronchitis; and 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 Family brought him from home. Patient is hard of hearing and not sure why he is here. Family states he has been extra sleepy and the day of admission and was extra confused. No localized weakness. His baseline he is able to ambulate and take care of himself. EMS was called and found patient to be slightly  hypotensive, diaphoretic and hypoxic. Mental state improved with IV fluids but not back to baseline. He has hx of mild cough but has hx of COPD. Patient has dementia at baseline. In ER he was found febrile up to 101.5   Hospital Course:  Sepsis/ Lactic acidosis  - acute bronchitis  - Influenza negative- D/C'd Tamiflu  - cont Zithro for total 5 days - d/c Rocephin on 3/15 - respiratory symptoms resolved now - CT chest for PE performed today for continued hypoxia- this was negative - after ambulating, he is noted to have a pulse ox of > 90 which he is able to maintained.   Active Problems:  Hypotension  - held BP meds- have resume all upon d/c as BP is now high  Coronary artery disease  - cont ASA   DM2 (diabetes mellitus, type 2)  - Novolog SSI   Systolic CHF, chronic (grade 1 DD)  - compensated   COPD with emphysema  - stable   Pacemaker-St.Jude   Dementia with behavioral disturbance  - currently quite oriented   Chronic renal insufficiency, stage II (mild)   Severe deconditioning - can barely walk a few feet- refusing SNF- very high risk for DVT   Discharge Exam: Filed Vitals:   12/07/13 0938  BP: 156/85  Pulse: 85  Temp: 97.7 F (36.5 C)  Resp: 20    General: Morbidly obese, AAO x 3 No acute respiratory distress  Lungs: No wheezes or crackles- severe rhonchi  Cardiovascular: Regular rate and rhythm without murmur gallop or rub normal S1 and S2  Abdomen: Nontender, nondistended, soft, bowel sounds positive, no rebound, no ascites, no appreciable mass  Extremities:  No significant cyanosis, clubbing, or edema bilateral lower extremities   Discharge Instructions      Discharge Orders   Future Appointments Provider Department Dept Phone   01/08/2014 10:45 AM Thompson Grayer, MD [place] [facility] [phone]   02/03/2014 11:30 AM Penni Bombard, MD [facility] [phone]   Future Orders Complete By Expires   Diet - low  sodium heart healthy  As directed    Scheduling Instructions:     Diabetic diet   Increase activity slowly  As directed        Medication List    STOP taking these medications       omeprazole 20 MG capsule  Commonly known as:  PRILOSEC      TAKE these medications       albuterol 108 (90 BASE) MCG/ACT inhaler  Commonly known as:  PROVENTIL HFA;VENTOLIN HFA  Inhale 2 puffs into the lungs every 4 (four) hours as needed. For shortness of breath     aspirin 325 MG tablet  Take 162.5 mg by mouth daily.     azithromycin 500 MG tablet  Commonly known as:  ZITHROMAX  Take 1 tablet (500 mg total) by mouth daily.     budesonide-formoterol 160-4.5 MCG/ACT inhaler  Commonly known as:  SYMBICORT  Inhale 1 puff into the lungs 2 (two) times daily.     CENTRUM SILVER PO  Take 1 tablet by mouth every morning.     donepezil 10 MG tablet  Commonly known as:  ARICEPT  Take 1 tablet (10 mg total) by mouth daily at 8 pm.     DSS 100 MG Caps  Take 100 mg by mouth 2 (two) times daily.     FISH OIL PO  Take 2 capsules by mouth daily.     folic acid 1 MG tablet  Commonly known as:  FOLVITE  Take 1 mg by mouth daily.     hydrochlorothiazide 25 MG tablet  Commonly known as:  HYDRODIURIL  Take 12.5 mg by mouth daily.     ipratropium-albuterol 0.5-2.5 (3) MG/3ML Soln  Commonly known as:  DUONEB  Take 3 mLs by nebulization every 6 (six) hours as needed. For shortness of breath     losartan 50 MG tablet  Commonly known as:  COZAAR  Take 1 tablet (50 mg total) by mouth daily.     MAGNESIUM SULFATE PO  Take 1 tablet by mouth daily.     metoprolol 50 MG tablet  Commonly known as:  LOPRESSOR  Take 1 tablet (50 mg total) by mouth 2 (two) times daily.     NAMENDA XR 28 MG Cp24  Generic drug:  Memantine HCl ER  Take 28 mg by mouth daily.     omeprazole 20 MG tablet  Commonly known as:  PRILOSEC OTC  Take 20 mg by mouth 2 (two) times daily.     polyethylene glycol packet   Commonly known as:  MIRALAX / GLYCOLAX  Take 17 g by mouth daily.     simvastatin 20 MG tablet  Commonly known as:  ZOCOR  Take 20 mg by mouth every evening.     SYSTANE 0.4-0.3 % Soln  Generic drug:  Polyethyl Glycol-Propyl Glycol  Apply 1 drop to eye daily as needed (for redness).     tiotropium 18 MCG inhalation capsule  Commonly known as:  SPIRIVA  Place 18 mcg into inhaler and inhale daily.       Allergies  Allergen Reactions  .  Doxycycline Other (See Comments)    Unknown- patient doesn't remember  . Penicillins Rash      The results of significant diagnostics from this hospitalization (including imaging, microbiology, ancillary and laboratory) are listed below for reference.    Significant Diagnostic Studies: Ct Head Wo Contrast  12/04/2013   CLINICAL DATA:  Altered mental status, prior transient ischemic attack  EXAM: CT HEAD WITHOUT CONTRAST  TECHNIQUE: Contiguous axial images were obtained from the base of the skull through the vertex without intravenous contrast.  COMPARISON:  DG CERVICAL SPINE 2-3V dated 10/23/2011; CT HEAD W/O CM dated 05/11/2011  FINDINGS: Mild cortical volume loss noted with proportional ventricular prominence. Areas of periventricular white matter hypodensity are most compatible with small vessel ischemic change. No acute hemorrhage, infarct, or mass lesion is identified. Mild pansinusitis is noted with mucoperiosteal thickening. Mild motion artifact is noted at the skull base. No skull fracture. Orbits are grossly unremarkable. Right external capsule lacunar infarct reidentified.  IMPRESSION: No acute intracranial abnormality.  Chronic findings as above.   Electronically Signed   By: Conchita Paris M.D.   On: 12/04/2013 16:41   Dg Chest Port 1 View  12/05/2013   CLINICAL DATA:  Cough, short of breath  EXAM: PORTABLE CHEST - 1 VIEW  COMPARISON:  Prior chest x-ray 12/04/2013  FINDINGS: Stable position of left subclavian approach cardiac rhythm  maintenance device. Stable cardiac and mediastinal contours. Atherosclerotic calcifications are noted in the aorta. Slightly decreased inspiratory volumes with resultant bibasilar subsegmental atelectasis and crowding of the pulmonary vasculature. There may be minimally increased vascular congestion without overt edema. No focal airspace consolidation or large effusion. No pneumothorax. No acute osseous abnormality.  IMPRESSION: 1. Lower inspiratory volumes with developing a bibasilar subsegmental atelectasis. 2. Slightly increased vascular congestion exaggerated by lower lung volumes. No overt pulmonary edema.   Electronically Signed   By: Jacqulynn Cadet M.D.   On: 12/05/2013 08:11   Dg Chest Port 1 View  12/04/2013   CLINICAL DATA:  Shortness of breath, fatigue, altered mental status, history hypertension, coronary artery disease, diabetes, COPD  EXAM: PORTABLE CHEST - 1 VIEW  COMPARISON:  Portable exam 1600 hr compared to 10/29/2012.  FINDINGS: Left subclavian transvenous pacemaker leads project over right atrium right ventricle.  Normal heart size, mediastinal contours and pulmonary vascularity.  Lungs clear.  No pleural effusion or pneumothorax.  Bones demineralized.  IMPRESSION: No acute abnormalities.   Electronically Signed   By: Lavonia Dana M.D.   On: 12/04/2013 17:06    Microbiology: Recent Results (from the past 240 hour(s))  CULTURE, BLOOD (ROUTINE X 2)     Status: None   Collection Time    12/04/13  3:54 PM      Result Value Ref Range Status   Specimen Description BLOOD ARM LEFT   Final   Special Requests BOTTLES DRAWN AEROBIC AND ANAEROBIC 5CC   Final   Culture  Setup Time     Final   Value: 12/04/2013 20:43     Performed at [facility]   Culture     Final   Value:        BLOOD CULTURE RECEIVED NO GROWTH TO DATE CULTURE WILL BE HELD FOR 5 DAYS BEFORE ISSUING A FINAL NEGATIVE REPORT     Performed at [facility]   Report Status PENDING   Incomplete  CULTURE,  BLOOD (ROUTINE X 2)     Status: None   Collection Time    12/04/13  3:58  PM      Result Value Ref Range Status   Specimen Description BLOOD WRIST RIGHT   Final   Special Requests BOTTLES DRAWN AEROBIC ONLY 5CC   Final   Culture  Setup Time     Final   Value: 12/04/2013 20:44     Performed at [facility]   Culture     Final   Value:        BLOOD CULTURE RECEIVED NO GROWTH TO DATE CULTURE WILL BE HELD FOR 5 DAYS BEFORE ISSUING A FINAL NEGATIVE REPORT     Performed at [facility]   Report Status PENDING   Incomplete  URINE CULTURE     Status: None   Collection Time    12/04/13  5:27 PM      Result Value Ref Range Status   Specimen Description URINE, RANDOM   Final   Special Requests NONE   Final   Culture  Setup Time     Final   Value: 12/04/2013 19:06     Performed at [facility]     Final   Value: 45,000 COLONIES/ML     Performed at [facility]   Culture     Final   Value: Multiple bacterial morphotypes present, none predominant. Suggest appropriate recollection if clinically indicated.     Performed at [facility]   Report Status 12/05/2013 FINAL   Final  MRSA PCR SCREENING     Status: None   Collection Time    12/04/13 10:35 PM      Result Value Ref Range Status   MRSA by PCR NEGATIVE  NEGATIVE Final   Comment:            The GeneXpert MRSA Assay (FDA     approved for NASAL specimens     only), is one component of a     comprehensive MRSA colonization     surveillance program. It is not     intended to diagnose MRSA     infection nor to guide or     monitor treatment for     MRSA infections.  CULTURE, EXPECTORATED SPUTUM-ASSESSMENT     Status: None   Collection Time    12/05/13  3:26 PM      Result Value Ref Range Status   Specimen Description SPUTUM   Final   Special Requests Normal   Final   Sputum evaluation     Final   Value: THIS SPECIMEN IS ACCEPTABLE. RESPIRATORY CULTURE REPORT TO FOLLOW.   Report  Status 12/05/2013 FINAL   Final  CULTURE, RESPIRATORY (NON-EXPECTORATED)     Status: None   Collection Time    12/05/13  3:26 PM      Result Value Ref Range Status   Specimen Description SPUTUM   Final   Special Requests NONE   Final   Gram Stain     Final   Value: MODERATE WBC PRESENT, PREDOMINANTLY PMN     FEW SQUAMOUS EPITHELIAL CELLS PRESENT     FEW YEAST     MODERATE GRAM POSITIVE COCCI IN PAIRS     IN CLUSTERS IN CHAINS FEW GRAM NEGATIVE RODS   Culture     Final   Value: Culture reincubated for better growth     Performed at [facility]   Report Status PENDING   Incomplete     Labs: Basic Metabolic Panel:  Recent Labs Lab 12/04/13 1511 12/05/13 1537 12/06/13  2000  NA 137 142 144  K 3.4* 3.8 3.8  CL 96 106 109  CO2 24 23 24   GLUCOSE 219* 197* 178*  BUN 13 12 17   CREATININE 1.29 1.25 1.31  CALCIUM 9.1 8.1* 8.7  MG  --  1.7  --   PHOS  --  2.2*  --    Liver Function Tests:  Recent Labs Lab 12/04/13 1511 12/05/13 1537  AST 21 19  ALT 14 10  ALKPHOS 111 89  BILITOT 0.7 0.3  PROT 6.6 5.3*  ALBUMIN 3.3* 2.5*   No results found for this basename: LIPASE, AMYLASE,  in the last 168 hours No results found for this basename: AMMONIA,  in the last 168 hours CBC:  Recent Labs Lab 12/04/13 1511 12/05/13 1537 12/06/13 2000  WBC 16.0* 7.0 7.2  NEUTROABS 13.3*  --   --   HGB 14.6 11.7* 11.5*  HCT 43.0 35.5* 35.4*  MCV 94.5 96.2 96.2  PLT 195 165 178   Cardiac Enzymes:  Recent Labs Lab 12/04/13 2348 12/05/13 1537 12/05/13 2250  TROPONINI <0.30 <0.30 <0.30   BNP: BNP (last 3 results) No results found for this basename: PROBNP,  in the last 8760 hours CBG:  Recent Labs Lab 12/06/13 1202 12/06/13 1644 12/06/13 2254 12/07/13 0805 12/07/13 1308  GLUCAP 126* 228* 161* 136* 118*       SignedDebbe Odea, MD [facility] 12/07/2013, 3:45 PM

## 2013-12-07 NOTE — Progress Notes (Signed)
PT Cancellation Note  Patient Details Name: [name] MRN: [id number] DOB: 08/20/1927   Cancelled Treatment:    Reason Eval/Treat Not Completed: Patient at procedure or test/unavailable.  Pt was not in the room.  Will return in the am.     INGOLD,Shamiracle Gorden 12/07/2013, 3:16 PM [facility] [phone] (pager)

## 2013-12-08 LAB — CULTURE, RESPIRATORY W GRAM STAIN: Culture: NORMAL

## 2013-12-08 LAB — CULTURE, RESPIRATORY

## 2013-12-08 NOTE — Care Management Note (Signed)
    Page 1 of 1   12/08/2013     9:49:22 AM   CARE MANAGEMENT NOTE 12/08/2013  Patient:  [name], [name]   Account Number:  [id number]  Date Initiated:  12/07/2013  Documentation initiated by:  Lovis More  Subjective/Objective Assessment:   dx sepsis; lives with spouse     DC Planning Services  CM consult   [place] arranged  [place] - 11 Patient Refused      Status of service:  Completed, signed off  Discharge Disposition:  HOME/SELF CARE  Per UR Regulation:  Reviewed for med. necessity/level of care/duration of stay  If discussed at Long Length of Stay Meetings, dates discussed:    Comments:  12/07/13  [place] RN MSN BSN CCM Received consult to arrange [facility] PT/OT.  Talked with pt and spouse @ bedside.  Spouse states pt is back to baseline, has ambulated in room and to BR.  She also states he has had balance issues for years and has a cane and walker @ home that he refuses to use.  Both decline home health services.  Staff RN will ambulate pt to determine need for home O2.

## 2013-12-08 NOTE — Progress Notes (Signed)
Found a prescription for Azithromycin for Mr. [name] in the printer.  Primary Nurse, Lillia Corporal, was called and verified if patient got his prescription. She stated that she texted Dr. Wynelle Cleveland and MD called it to [place] at [facility].  Mrs. Withem was called and inquired her if she got the prescription for Azithromycin for her husband.  She stated that she picked it up this morning and she is going to give him a dose this evening.

## 2013-12-10 LAB — CULTURE, BLOOD (ROUTINE X 2)

## 2013-12-19 ENCOUNTER — Other Ambulatory Visit: Payer: Self-pay | Admitting: Cardiology

## 2014-01-08 ENCOUNTER — Encounter: Payer: Self-pay | Admitting: Internal Medicine

## 2014-01-08 ENCOUNTER — Ambulatory Visit (INDEPENDENT_AMBULATORY_CARE_PROVIDER_SITE_OTHER): Payer: Medicare Other | Admitting: Internal Medicine

## 2014-01-08 ENCOUNTER — Encounter (INDEPENDENT_AMBULATORY_CARE_PROVIDER_SITE_OTHER): Payer: Self-pay

## 2014-01-08 VITALS — BP 128/82 | HR 73 | Ht 66.0 in | Wt 210.0 lb

## 2014-01-08 DIAGNOSIS — I10 Essential (primary) hypertension: Secondary | ICD-10-CM

## 2014-01-08 DIAGNOSIS — I441 Atrioventricular block, second degree: Secondary | ICD-10-CM

## 2014-01-08 DIAGNOSIS — I2510 Atherosclerotic heart disease of native coronary artery without angina pectoris: Secondary | ICD-10-CM

## 2014-01-08 LAB — MDC_IDC_ENUM_SESS_TYPE_INCLINIC
Brady Statistic RA Percent Paced: 7.6 %
Brady Statistic RV Percent Paced: 0.19 %
Date Time Interrogation Session: 20150417105518
Lead Channel Impedance Value: 362.5 Ohm
Lead Channel Impedance Value: 550 Ohm
Lead Channel Pacing Threshold Pulse Width: 0.4 ms
Lead Channel Pacing Threshold Pulse Width: 0.4 ms
Lead Channel Sensing Intrinsic Amplitude: 4 mV
Lead Channel Setting Pacing Amplitude: 1.875
Lead Channel Setting Pacing Pulse Width: 0.4 ms
Lead Channel Setting Sensing Sensitivity: 2 mV
MDC IDC MSMT BATTERY REMAINING LONGEVITY: 114 mo
MDC IDC MSMT BATTERY VOLTAGE: 2.96 V
MDC IDC MSMT LEADCHNL RA PACING THRESHOLD AMPLITUDE: 0.625 V
MDC IDC MSMT LEADCHNL RV PACING THRESHOLD AMPLITUDE: 1.625 V
MDC IDC MSMT LEADCHNL RV SENSING INTR AMPL: 12 mV
MDC IDC PG SERIAL: 7228914
MDC IDC SET LEADCHNL RA PACING AMPLITUDE: 1.625

## 2014-01-08 NOTE — Patient Instructions (Signed)
Your physician wants you to follow-up in: 12 months with Ileene Hutchinson, PA You will receive a reminder letter in the mail two months in advance. If you don't receive a letter, please call our office to schedule the follow-up appointment.   Remote monitoring is used to monitor your Pacemaker or ICD from home. This monitoring reduces the number of office visits required to check your device to one time per year. It allows [place] to keep an eye on the functioning of your device to ensure it is working properly. You are scheduled for a device check from home on 04/12/14. You may send your transmission at any time that day. If you have a wireless device, the transmission will be sent automatically. After your physician reviews your transmission, you will receive a postcard with your next transmission date.

## 2014-01-08 NOTE — Progress Notes (Signed)
The patient presents today for routine electrophysiology followup.  He was recently hospitalized with dehydration/ dementia.  His wife feels that he is back to baseline.  Today, he denies symptoms of palpitations, chest pain, shortness of breath, orthopnea, PND, dizziness, presyncope, syncope, or neurologic sequela.  He has chronic edema.  The patient feels that he is tolerating medications without difficulties and is otherwise without complaint today.   Past Medical History  Diagnosis Date  . Chest discomfort   . Coronary artery disease   . Diabetes mellitus   . Hypertension   . Hyperlipidemia   . Obesities, morbid   . Gallstone pancreatitis     recurrent  . TIA (transient ischemic attack)   . Bacteremia due to vancomycin resistant Enterococcus   . GERD (gastroesophageal reflux disease)   . Hearing impairment   . Myocardial infarction, anterior wall, subsequent care   . Second degree AV block, Mobitz type II     s/p PPM by JA 4/12  . ARF (acute renal failure)   . Cholangitis   . Duodenal ulcer   . GI bleed   . Anemia associated with acute blood loss   . 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 Dementia in Alzheimer's disease with delirium    Past Surgical History  Procedure Laterality Date  . Cholecystectomy    . Pacemaker placement  01/01/11    implanted by JA for mobitz II AV block  . Pancreatic stent      Current Outpatient Prescriptions  Medication Sig Dispense Refill  . albuterol (PROVENTIL HFA;VENTOLIN HFA) 108 (90 BASE) MCG/ACT inhaler Inhale 2 puffs into the lungs every 4 (four) hours as needed. For shortness of breath      . aspirin 325 MG tablet Take 162.5 mg by mouth daily.       . budesonide-formoterol (SYMBICORT) 160-4.5 MCG/ACT inhaler Inhale 1 puff into the lungs 2 (two) times daily.  1 Inhaler  0  . donepezil (ARICEPT) 10 MG tablet Take 1 tablet (10 mg total) by mouth daily at 8 pm.  30 tablet  6  . folic acid (FOLVITE) 1 MG  tablet Take 1 mg by mouth daily.      . hydrochlorothiazide (HYDRODIURIL) 25 MG tablet Take 12.5 mg by mouth daily.      Marland Kitchen ipratropium-albuterol (DUONEB) 0.5-2.5 (3) MG/3ML SOLN Take 3 mLs by nebulization every 6 (six) hours as needed. For shortness of breath      . losartan (COZAAR) 50 MG tablet Take 1 tablet (50 mg total) by mouth daily.  30 tablet  11  . MAGNESIUM SULFATE PO Take 1 tablet by mouth daily.      . Memantine HCl ER (NAMENDA XR) 28 MG CP24 Take 28 mg by mouth daily.       . metoprolol (LOPRESSOR) 50 MG tablet TAKE 1 TABLET TWICE A DAY  180 tablet  3  . Multiple Vitamins-Minerals (CENTRUM SILVER PO) Take 1 tablet by mouth every morning.       . Omega-3 Fatty Acids (FISH OIL PO) Take 2 capsules by mouth daily.      Marland Kitchen omeprazole (PRILOSEC OTC) 20 MG tablet Take 20 mg by mouth 2 (two) times daily.      Vladimir Faster Glycol-Propyl Glycol (SYSTANE) 0.4-0.3 % SOLN Apply 1 drop to eye daily as needed (for redness).      . polyethylene glycol (MIRALAX / GLYCOLAX) packet Take 17 g by mouth daily.      Marland Kitchen  simvastatin (ZOCOR) 20 MG tablet Take 20 mg by mouth every evening.       . tiotropium (SPIRIVA) 18 MCG inhalation capsule Place 18 mcg into inhaler and inhale daily.       No current facility-administered medications for this visit.    Allergies  Allergen Reactions  . Doxycycline Other (See Comments)    Unknown- patient doesn't remember  . Penicillins Rash    History   Social History  . Marital Status: Married    Spouse Name: [name]    Number of Children: 1  . Years of Education: 6   Occupational History  . 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 Smoking status: Former Smoker -- 1.00 packs/day for 57 years    Types: Cigarettes    Quit date: 01/12/1996  . Smokeless tobacco: Never Used  . Alcohol Use: No  . Drug Use: No  . Sexual Activity: Not on file   Other Topics Concern  . Not on file   Social History Narrative   Patient is married  [name]) and lives with his wife.   Patient has one child.   Patient is retired.   Patient has a 6th grade education.   Patient is right-handed.   Patient drinks caffeine daily.    Physical Exam: Filed Vitals:   01/08/14 1044  BP: 128/82  Pulse: 73  Height: 5\' 6"  (1.676 m)  Weight: 210 lb (95.255 kg)    GEN- The patient is well appearing, alert and oriented x 3 today.   Head- normocephalic, atraumatic Eyes-  Sclera clear, conjunctiva pink Ears- hearing intact Oropharynx- clear Neck- supple, no JVP Lymph- no cervical lymphadenopathy Lungs- Clear to ausculation bilaterally, normal work of breathing Chest- pacemaker pocket is well healed Heart- Regular rate and rhythm, no murmurs, rubs or gallops, PMI not laterally displaced GI- soft, NT, ND, + BS Extremities- no clubbing, cyanosis, chronic dependant edema MS- no significant deformity or atrophy  Pacemaker interrogation- reviewed in detail today,  See PACEART report  Assessment and Plan:  1. Mobitz II second degree AV block Normal pacemaker function See Pace Art report No changes today RV lead threshold is chronically elevated but stable.  He does not V pace presently  2. Htn Stable No change required today  3. Cad Stable No change required today  [place] Return to see Jerene Pitch in 1 year

## 2014-02-03 ENCOUNTER — Ambulatory Visit (INDEPENDENT_AMBULATORY_CARE_PROVIDER_SITE_OTHER): Payer: Medicare Other | Admitting: Diagnostic Neuroimaging

## 2014-02-03 ENCOUNTER — Encounter: Payer: Self-pay | Admitting: Diagnostic Neuroimaging

## 2014-02-03 VITALS — BP 137/73 | HR 70 | Ht 66.5 in | Wt 213.0 lb

## 2014-02-03 DIAGNOSIS — F03918 Unspecified dementia, unspecified severity, with other behavioral disturbance: Secondary | ICD-10-CM

## 2014-02-03 DIAGNOSIS — F0391 Unspecified dementia with behavioral disturbance: Secondary | ICD-10-CM

## 2014-02-03 NOTE — Progress Notes (Signed)
[facility]  PATIENT: [name] DOB: 1927-02-03  REFERRING CLINICIAN:  HISTORY FROM: patient's wife; patient grunts and say very little REASON FOR VISIT: follow up   HISTORICAL  CHIEF COMPLAINT:  Chief Complaint  Patient presents with  . Follow-up    memory    HISTORY OF PRESENT ILLNESS:   UPDATE 02/03/14: Some continued progression of memory/dementia. Able to dress, eat, bath and cut grass. Wife does most of other ADLs. Not driving anymore. Some hallucinations (non-threatening). Severe hearing loss.   UPDATE (08/06/13, CM): 80 year old white male returns for followup. He has a history of memory loss. Patient is very hard of hearing and does not contribute to the history taking. Wife answers most of the questions. He has a long history of hypertension, diabetes, hyperlipidemia and has had progressive memory loss for several years. He denies that he has any memory loss. He has continued to get lost while driving, having difficulty with shopping and apparently tells detailed stories to cover memory lapses. He continues to be independent with feeding, dressing and bathing himself. His wife does the cooking, cleaning and finances.He can mow the grass otherwise he is not active. He has a sixth grade education.  UPDATE (11/09/11 VRP): No new events. Patient not contributing to history. Answers yes and no. Wife does most of talking. He still drives short distances to local store.   PRIOR HPI (07/03/10, VRP): 80 year old right-handed male with history of hypertension, diabetes, hypercholesterolemia presenting for evaluation of her progressive memory loss the past one year. He is accompanied by his wife at this visit. Patient denies any problems with memory or confusion. His wife has kept a detailed notebook of multiple events over the past year including: Getting lost while driving, having difficulty with shopping, having trouble with registering his car.  He seems to  cover up his memory lapses with stories.  He continues to drive.  He mows the lawn and uses a tractor at home. He takes care of his own personal hygiene.  His wife takes care of the other household chores including cooking, cleaning and finances. 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   REVIEW OF SYSTEMS: Full 14 system review of systems performed and notable only for hearing loss runny nose loss of vision eye redness bruising confusion hallucinations or talking acting out dreams daytime sleepiness shortness of breath.  ALLERGIES: Allergies  Allergen Reactions  . Doxycycline Other (See Comments)    Unknown- patient doesn't remember  . Penicillins Rash    HOME MEDICATIONS: Outpatient Prescriptions Prior to Visit  Medication Sig Dispense Refill  . albuterol (PROVENTIL HFA;VENTOLIN HFA) 108 (90 BASE) MCG/ACT inhaler Inhale 2 puffs into the lungs every 4 (four) hours as needed. For shortness of breath      . aspirin 325 MG tablet Take 162.5 mg by mouth daily.       . budesonide-formoterol (SYMBICORT) 160-4.5 MCG/ACT inhaler Inhale 1 puff into the lungs 2 (two) times daily.  1 Inhaler  0  . donepezil (ARICEPT) 10 MG tablet Take 1 tablet (10 mg total) by mouth daily at 8 pm.  30 tablet  6  . folic acid (FOLVITE) 1 MG tablet Take 1 mg by mouth daily.      . hydrochlorothiazide (HYDRODIURIL) 25 MG tablet Take 12.5 mg by mouth daily.      Marland Kitchen ipratropium-albuterol (DUONEB) 0.5-2.5 (3) MG/3ML SOLN Take 3 mLs by nebulization every 6 (six) hours as needed. For  shortness of breath      . losartan (COZAAR) 50 MG tablet Take 1 tablet (50 mg total) by mouth daily.  30 tablet  11  . MAGNESIUM SULFATE PO Take 1 tablet by mouth daily.      . Memantine HCl ER (NAMENDA XR) 28 MG CP24 Take 28 mg by mouth daily.       . metoprolol (LOPRESSOR) 50 MG tablet TAKE 1 TABLET TWICE A DAY  180 tablet  3  . Multiple Vitamins-Minerals (CENTRUM SILVER PO) Take 1 tablet by mouth  every morning.       . Omega-3 Fatty Acids (FISH OIL PO) Take 2 capsules by mouth daily.      Marland Kitchen omeprazole (PRILOSEC OTC) 20 MG tablet Take 20 mg by mouth 2 (two) times daily.      Vladimir Faster Glycol-Propyl Glycol (SYSTANE) 0.4-0.3 % SOLN Apply 1 drop to eye daily as needed (for redness).      . polyethylene glycol (MIRALAX / GLYCOLAX) packet Take 17 g by mouth daily.      . simvastatin (ZOCOR) 20 MG tablet Take 20 mg by mouth every evening.       . tiotropium (SPIRIVA) 18 MCG inhalation capsule Place 18 mcg into inhaler and inhale daily.       No facility-administered medications prior to visit.    PAST MEDICAL HISTORY: Past Medical History  Diagnosis Date  . Chest discomfort   . Coronary artery disease   . Diabetes mellitus   . Hypertension   . Hyperlipidemia   . Obesities, morbid   . Gallstone pancreatitis     recurrent  . TIA (transient ischemic attack)   . Bacteremia due to vancomycin resistant Enterococcus   . GERD (gastroesophageal reflux disease)   . Hearing impairment   . Myocardial infarction, anterior wall, subsequent care   . Second degree AV block, Mobitz type II     s/p PPM by JA 4/12  . ARF (acute renal failure)   . Cholangitis   . Duodenal ulcer   . GI bleed   . Anemia associated with acute blood loss   . 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 Dementia in Alzheimer's disease with delirium     PAST SURGICAL HISTORY: Past Surgical History  Procedure Laterality Date  . Cholecystectomy    . Pacemaker placement  01/01/11    implanted by JA for mobitz II AV block  . Pancreatic stent      FAMILY HISTORY: Family History  Problem Relation Age of Onset  . Hypertension Other     SOCIAL HISTORY:  History   Social History  . Marital Status: Married    Spouse Name: [name]    Number of Children: 1  . Years of Education: 6   Occupational History  . 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  Smoking status: Former Smoker -- 1.00 packs/day for 57 years    Types: Cigarettes    Quit date: 01/12/1996  . Smokeless tobacco: Never Used  . Alcohol Use: No  . Drug Use: No  . Sexual Activity: Not on file   Other Topics Concern  . Not on file   Social History Narrative   Patient is married [name]) and lives with his wife.   Patient has one child.   Patient is retired.   Patient has a 6th grade education.   Patient is right-handed.   Patient drinks  caffeine daily.     PHYSICAL EXAM  Filed Vitals:   02/03/14 1150  BP: 137/73  Pulse: 70  Height: 5' 6.5" (1.689 m)  Weight: 213 lb (96.616 kg)    Not recorded    Body mass index is 33.87 kg/(m^2).  GENERAL EXAM: Patient is in no distress; well developed, nourished and groomed; neck is supple; GRUNTING AND HEAVY BREATHING.   CARDIOVASCULAR: Regular rate and rhythm, no murmurs, no carotid bruits  NEUROLOGIC: MENTAL STATUS: awake, alert; SAYS YEAH, HUH, MMM HMM. NAMES PENCIL AND PHONE. FOLLOW SIMPLE COMMANDS. VERY HARD OF HEARING. CANNOT COMPLETE MMSE TESTING. ORIENTED TO MARCH/APRIL, TUES, [place], [place].  CRANIAL NERVE: pupils equal and reactive to light, visual fields full to confrontation, extraocular muscles intact, no nystagmus, facial sensation and strength symmetric, hearing intact, palate elevates symmetrically, uvula midline, shoulder shrug symmetric, tongue midline. MOTOR: normal bulk and tone, full strength in the BUE, BLE SENSORY: normal and symmetric to light touch, temperature, vibration COORDINATION: finger-nose-finger, fine finger movements, normal REFLEXES: deep tendon reflexes present and symmetric; ABSENT AT ANKLES. GAIT/STATION: narrow based gait; romberg is negative    DIAGNOSTIC DATA (LABS, IMAGING, TESTING) - I reviewed patient records, labs, notes, testing and imaging myself where available.  Lab Results  Component Value Date   WBC 7.2 12/06/2013   HGB 11.5* 12/06/2013   HCT 35.4* 12/06/2013    MCV 96.2 12/06/2013   PLT 178 12/06/2013      Component Value Date/Time   NA 144 12/06/2013 2000   K 3.8 12/06/2013 2000   CL 109 12/06/2013 2000   CO2 24 12/06/2013 2000   GLUCOSE 178* 12/06/2013 2000   BUN 17 12/06/2013 2000   CREATININE 1.31 12/06/2013 2000   CALCIUM 8.7 12/06/2013 2000   PROT 5.3* 12/05/2013 1537   ALBUMIN 2.5* 12/05/2013 1537   AST 19 12/05/2013 1537   ALT 10 12/05/2013 1537   ALKPHOS 89 12/05/2013 1537   BILITOT 0.3 12/05/2013 1537   GFRNONAA 48* 12/06/2013 2000   GFRAA 56* 12/06/2013 2000   Lab Results  Component Value Date   CHOL 212* 03/12/2011   HDL 65.40 03/12/2011   LDLCALC  Value: 91        Total Cholesterol/HDL:CHD Risk Coronary Heart Disease Risk Table                     Men   Women  1/2 Average Risk   3.4   3.3  Average Risk       5.0   4.4  2 X Average Risk   9.6   7.1  3 X Average Risk  23.4   11.0        Use the calculated Patient Ratio above and the CHD Risk Table to determine the patient's CHD Risk.        ATP III CLASSIFICATION (LDL):  <100     mg/dL   Optimal  100-129  mg/dL   Near or Above                    Optimal  130-159  mg/dL   Borderline  160-189  mg/dL   High  >190     mg/dL   Very High 01/19/2011   LDLDIRECT 126.1 03/12/2011   TRIG 115.0 03/12/2011   CHOLHDL 3 03/12/2011   Lab Results  Component Value Date   HGBA1C 7.6* 12/04/2013   Lab Results  Component Value Date   VITAMINB12 371 05/15/2011   Lab Results  Component Value Date   TSH 2.538 12/05/2013    11/09/11 Labs: B12 314, MMA 355, homocysteine 12.9  10/27/10 MRI brain (with and without contrast) demonstrating: 1. Moderate to severe perisylvian and moderate diffuse atrophy. 2. Moderate chronic small vessel ischemic disease with chronic ischemic infarctions in the bilateral cerebellum, central pons and bilateral centrum semiovale.   ASSESSMENT AND PLAN  80 y.o. year old male here with hypertension, diabetes, hypercholesterolemia presenting with progressive short-term memory loss and  confusion and inappropriate behaviors, with intermittent hallucinations. Not educated beyond 6th grade.  MMSE  07/04/10 - 15/30 11/09/11 - 13/30 02/03/13 - 20/30 02/03/14 - cannot complete testing  Ddx: dementia (alzheimer's vs dementia with lewy bodies)  PLAN: 1. Continue Namenda XR 28mg  daily + donepezil 10mg  qhs  Return if symptoms worsen or fail to improve, for return to PCP.    Penni Bombard, MD 2/70/3500, 93:81 PM Certified in Neurology, Neurophysiology and Neuroimaging  [facility] [address] [place], [place] [address] [phone]

## 2014-03-03 ENCOUNTER — Ambulatory Visit (INDEPENDENT_AMBULATORY_CARE_PROVIDER_SITE_OTHER): Payer: Medicare Other | Admitting: Pulmonary Disease

## 2014-03-03 ENCOUNTER — Encounter: Payer: Self-pay | Admitting: Pulmonary Disease

## 2014-03-03 VITALS — BP 132/82 | HR 68 | Temp 97.2°F | Ht 67.0 in | Wt 214.0 lb

## 2014-03-03 DIAGNOSIS — J438 Other emphysema: Secondary | ICD-10-CM

## 2014-03-03 DIAGNOSIS — J439 Emphysema, unspecified: Secondary | ICD-10-CM

## 2014-03-03 NOTE — Progress Notes (Signed)
Chief Complaint  Patient presents with  . Follow-up    Pt states that breathing has been doing well since last visit. No change.    History of Present Illness: [name] is a 80 y.o. male former smoker with severe COPD/emphysema.  He has been doing well.  He gets occasional cough and clear sputum.  He denies wheeze, chest pain, or leg swelling.  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  [name]  has past surgical history that includes Cholecystectomy; pacemaker placement (01/01/11); and pancreatic stent.  Prior to Admission medications   Medication Sig Start Date End Date Taking? Authorizing Provider  albuterol (PROVENTIL HFA;VENTOLIN HFA) 108 (90 BASE) MCG/ACT inhaler Inhale 2 puffs into the lungs every 4 (four) hours as needed. For shortness of breath   Yes Historical Provider, MD  aspirin 325 MG tablet Take 162.5 mg by mouth daily.    Yes Historical Provider, MD  budesonide-formoterol (SYMBICORT) 160-4.5 MCG/ACT inhaler Inhale 1 puff into the lungs 2 (two) times daily.   Yes Historical Provider, MD  donepezil (ARICEPT)  5 MG tablet Take 5 mg by mouth at bedtime.   Yes Historical Provider, MD  ezetimibe (ZETIA) 10 MG tablet Take 10 mg by mouth daily.    Yes Historical Provider, MD  folic acid (FOLVITE) 1 MG tablet Take 1 mg by mouth daily.   Yes Historical Provider, MD  hydrochlorothiazide (HYDRODIURIL) 25 MG tablet Take 12.5 mg by mouth daily.   Yes Historical Provider, MD  ipratropium-albuterol (DUONEB) 0.5-2.5 (3) MG/3ML SOLN Take 3 mLs by nebulization every 6 (six) hours as needed. For shortness of breath   Yes Historical Provider, MD  losartan (COZAAR) 50 MG tablet Take 1 tablet (50 mg total) by mouth daily. 11/04/12  Yes Donita Brooks, NP  MAGNESIUM SULFATE PO Take 1 tablet by mouth daily.   Yes Historical Provider, MD  metoprolol (LOPRESSOR) 50 MG tablet Take 50 mg by mouth 2 (two) times daily.   Yes Historical Provider, MD  Multiple Vitamins-Minerals (CENTRUM SILVER PO) Take 1 tablet by mouth every morning.    Yes Historical Provider, MD  Multiple Vitamins-Minerals (ICAPS PO) Take 1 tablet by mouth daily.   Yes Historical Provider, MD  nitroGLYCERIN (NITROSTAT) 0.4 MG SL tablet Place 0.4 mg under the tongue every 5 (five) minutes as needed. For chest pain   Yes Historical Provider, MD  Omega-3 Fatty Acids (FISH OIL PO) Take 2 capsules by mouth daily.   Yes Historical Provider, MD  omeprazole (PRILOSEC OTC) 20 MG tablet Take 20 mg by mouth 2 (two) times daily.   Yes Historical Provider, MD  polyethylene glycol (MIRALAX / GLYCOLAX) packet Take 17 g by mouth daily.   Yes Historical Provider, MD  predniSONE (DELTASONE) 10  MG tablet 3 tabs daily for 4 days, then 2 tabs daily for 4 days, then 1 tab daily for 4 days then stop 11/04/12  Yes Donita Brooks, NP  simvastatin (ZOCOR) 20 MG tablet Take 20 mg by mouth daily.   Yes Historical Provider, MD  Spacer/Aero-Holding Chambers (AEROCHAMBER PLUS FLO-VU MEDIUM) MISC 1 each by Other route once. 11/04/12  Yes Donita Brooks, NP  tiotropium (SPIRIVA) 18 MCG inhalation  capsule Place 18 mcg into inhaler and inhale daily.   Yes Historical Provider, MD    Allergies  Allergen Reactions  . Doxycycline Other (See Comments)    Unknown- patient doesn't remember  . Penicillins Rash     Physical Exam:  General - No distress ENT - No sinus tenderness, no oral exudate, no LAN Cardiac - s1s2 regular, no murmur Chest - Decreased breath sounds, no wheeze Back - No focal tenderness Abd - Soft, non-tender Ext - No edema Neuro - Normal strength, poor hearing Skin - No rashes Psych - normal mood, and behavior   Assessment/Plan:  Chesley Mires, MD [facility] Pager:  [phone] 03/03/2014, 2:48 PM

## 2014-03-03 NOTE — Assessment & Plan Note (Signed)
Stable on current regimen of spiriva and symbicort.

## 2014-03-03 NOTE — Patient Instructions (Signed)
Follow up in 6 months 

## 2014-04-12 ENCOUNTER — Ambulatory Visit (INDEPENDENT_AMBULATORY_CARE_PROVIDER_SITE_OTHER): Payer: Medicare Other | Admitting: *Deleted

## 2014-04-12 DIAGNOSIS — I441 Atrioventricular block, second degree: Secondary | ICD-10-CM

## 2014-04-12 NOTE — Progress Notes (Signed)
Remote pacemaker transmission.   

## 2014-04-19 LAB — MDC_IDC_ENUM_SESS_TYPE_REMOTE
Battery Remaining Longevity: 97 mo
Battery Remaining Percentage: 73 %
Battery Voltage: 2.95 V
Brady Statistic AS VP Percent: 1 %
Brady Statistic AS VS Percent: 96 %
Brady Statistic RA Percent Paced: 3.5 %
Date Time Interrogation Session: 20150720074212
Lead Channel Impedance Value: 360 Ohm
Lead Channel Pacing Threshold Pulse Width: 0.4 ms
Lead Channel Sensing Intrinsic Amplitude: 3.9 mV
Lead Channel Setting Pacing Amplitude: 2.25 V
Lead Channel Setting Pacing Pulse Width: 0.4 ms
MDC IDC MSMT LEADCHNL RA PACING THRESHOLD AMPLITUDE: 0.625 V
MDC IDC MSMT LEADCHNL RA PACING THRESHOLD PULSEWIDTH: 0.4 ms
MDC IDC MSMT LEADCHNL RV IMPEDANCE VALUE: 510 Ohm
MDC IDC MSMT LEADCHNL RV PACING THRESHOLD AMPLITUDE: 2 V
MDC IDC MSMT LEADCHNL RV SENSING INTR AMPL: 12 mV
MDC IDC PG SERIAL: 7228914
MDC IDC SET LEADCHNL RA PACING AMPLITUDE: 1.625
MDC IDC SET LEADCHNL RV SENSING SENSITIVITY: 2 mV
MDC IDC STAT BRADY AP VP PERCENT: 1 %
MDC IDC STAT BRADY AP VS PERCENT: 3.5 %
MDC IDC STAT BRADY RV PERCENT PACED: 1 %

## 2014-04-26 ENCOUNTER — Other Ambulatory Visit: Payer: Self-pay

## 2014-04-26 MED ORDER — DONEPEZIL HCL 10 MG PO TABS: 10.0000 mg | ORAL_TABLET | Freq: Every day | ORAL | Status: DC

## 2014-05-06 ENCOUNTER — Encounter: Payer: Self-pay | Admitting: Cardiology

## 2014-05-17 ENCOUNTER — Other Ambulatory Visit: Payer: Self-pay | Admitting: Diagnostic Neuroimaging

## 2014-05-27 ENCOUNTER — Encounter: Payer: Self-pay | Admitting: Internal Medicine

## 2014-05-28 ENCOUNTER — Inpatient Hospital Stay (HOSPITAL_BASED_OUTPATIENT_CLINIC_OR_DEPARTMENT_OTHER)
Admission: EM | Admit: 2014-05-28 | Discharge: 2014-06-05 | DRG: 872 | Disposition: A | Discharge status: Home Health | Payer: Medicare Other | Attending: Internal Medicine | Admitting: Internal Medicine

## 2014-05-28 ENCOUNTER — Emergency Department (HOSPITAL_BASED_OUTPATIENT_CLINIC_OR_DEPARTMENT_OTHER): Payer: Medicare Other

## 2014-05-28 ENCOUNTER — Encounter (HOSPITAL_BASED_OUTPATIENT_CLINIC_OR_DEPARTMENT_OTHER): Payer: Self-pay | Admitting: Emergency Medicine

## 2014-05-28 DIAGNOSIS — K861 Other chronic pancreatitis: Secondary | ICD-10-CM | POA: Diagnosis present

## 2014-05-28 DIAGNOSIS — E119 Type 2 diabetes mellitus without complications: Secondary | ICD-10-CM | POA: Diagnosis present

## 2014-05-28 DIAGNOSIS — Z79899 Other long term (current) drug therapy: Secondary | ICD-10-CM | POA: Diagnosis not present

## 2014-05-28 DIAGNOSIS — I2510 Atherosclerotic heart disease of native coronary artery without angina pectoris: Secondary | ICD-10-CM | POA: Diagnosis present

## 2014-05-28 DIAGNOSIS — Z950 Presence of cardiac pacemaker: Secondary | ICD-10-CM

## 2014-05-28 DIAGNOSIS — F03918 Unspecified dementia, unspecified severity, with other behavioral disturbance: Secondary | ICD-10-CM | POA: Diagnosis present

## 2014-05-28 DIAGNOSIS — B952 Enterococcus as the cause of diseases classified elsewhere: Secondary | ICD-10-CM | POA: Diagnosis present

## 2014-05-28 DIAGNOSIS — F02818 Dementia in other diseases classified elsewhere, unspecified severity, with other behavioral disturbance: Secondary | ICD-10-CM | POA: Diagnosis present

## 2014-05-28 DIAGNOSIS — Z66 Do not resuscitate: Secondary | ICD-10-CM | POA: Diagnosis present

## 2014-05-28 DIAGNOSIS — R1110 Vomiting, unspecified: Secondary | ICD-10-CM | POA: Diagnosis present

## 2014-05-28 DIAGNOSIS — K8050 Calculus of bile duct without cholangitis or cholecystitis without obstruction: Secondary | ICD-10-CM | POA: Diagnosis present

## 2014-05-28 DIAGNOSIS — J439 Emphysema, unspecified: Secondary | ICD-10-CM

## 2014-05-28 DIAGNOSIS — N189 Chronic kidney disease, unspecified: Secondary | ICD-10-CM | POA: Diagnosis present

## 2014-05-28 DIAGNOSIS — I509 Heart failure, unspecified: Secondary | ICD-10-CM | POA: Diagnosis present

## 2014-05-28 DIAGNOSIS — F0391 Unspecified dementia with behavioral disturbance: Secondary | ICD-10-CM | POA: Diagnosis present

## 2014-05-28 DIAGNOSIS — R0789 Other chest pain: Secondary | ICD-10-CM

## 2014-05-28 DIAGNOSIS — Z7982 Long term (current) use of aspirin: Secondary | ICD-10-CM

## 2014-05-28 DIAGNOSIS — D72829 Elevated white blood cell count, unspecified: Secondary | ICD-10-CM

## 2014-05-28 DIAGNOSIS — E785 Hyperlipidemia, unspecified: Secondary | ICD-10-CM

## 2014-05-28 DIAGNOSIS — K8309 Other cholangitis: Secondary | ICD-10-CM

## 2014-05-28 DIAGNOSIS — R945 Abnormal results of liver function studies: Secondary | ICD-10-CM

## 2014-05-28 DIAGNOSIS — Z6831 Body mass index (BMI) 31.0-31.9, adult: Secondary | ICD-10-CM | POA: Diagnosis not present

## 2014-05-28 DIAGNOSIS — R413 Other amnesia: Secondary | ICD-10-CM

## 2014-05-28 DIAGNOSIS — K219 Gastro-esophageal reflux disease without esophagitis: Secondary | ICD-10-CM | POA: Diagnosis present

## 2014-05-28 DIAGNOSIS — J449 Chronic obstructive pulmonary disease, unspecified: Secondary | ICD-10-CM | POA: Diagnosis present

## 2014-05-28 DIAGNOSIS — F0281 Dementia in other diseases classified elsewhere with behavioral disturbance: Secondary | ICD-10-CM | POA: Diagnosis present

## 2014-05-28 DIAGNOSIS — K851 Biliary acute pancreatitis without necrosis or infection: Secondary | ICD-10-CM

## 2014-05-28 DIAGNOSIS — J4489 Other specified chronic obstructive pulmonary disease: Secondary | ICD-10-CM | POA: Diagnosis present

## 2014-05-28 DIAGNOSIS — I441 Atrioventricular block, second degree: Secondary | ICD-10-CM

## 2014-05-28 DIAGNOSIS — Z8673 Personal history of transient ischemic attack (TIA), and cerebral infarction without residual deficits: Secondary | ICD-10-CM | POA: Diagnosis not present

## 2014-05-28 DIAGNOSIS — R932 Abnormal findings on diagnostic imaging of liver and biliary tract: Secondary | ICD-10-CM

## 2014-05-28 DIAGNOSIS — Z87891 Personal history of nicotine dependence: Secondary | ICD-10-CM

## 2014-05-28 DIAGNOSIS — Z880 Allergy status to penicillin: Secondary | ICD-10-CM | POA: Diagnosis not present

## 2014-05-28 DIAGNOSIS — I5022 Chronic systolic (congestive) heart failure: Secondary | ICD-10-CM | POA: Diagnosis not present

## 2014-05-28 DIAGNOSIS — I252 Old myocardial infarction: Secondary | ICD-10-CM | POA: Diagnosis not present

## 2014-05-28 DIAGNOSIS — I10 Essential (primary) hypertension: Secondary | ICD-10-CM | POA: Diagnosis present

## 2014-05-28 DIAGNOSIS — I129 Hypertensive chronic kidney disease with stage 1 through stage 4 chronic kidney disease, or unspecified chronic kidney disease: Secondary | ICD-10-CM | POA: Diagnosis present

## 2014-05-28 DIAGNOSIS — N2889 Other specified disorders of kidney and ureter: Secondary | ICD-10-CM

## 2014-05-28 DIAGNOSIS — A419 Sepsis, unspecified organism: Secondary | ICD-10-CM | POA: Diagnosis present

## 2014-05-28 DIAGNOSIS — F0280 Dementia in other diseases classified elsewhere without behavioral disturbance: Secondary | ICD-10-CM | POA: Diagnosis present

## 2014-05-28 DIAGNOSIS — G309 Alzheimer's disease, unspecified: Secondary | ICD-10-CM | POA: Diagnosis present

## 2014-05-28 DIAGNOSIS — R7881 Bacteremia: Secondary | ICD-10-CM

## 2014-05-28 DIAGNOSIS — A498 Other bacterial infections of unspecified site: Secondary | ICD-10-CM | POA: Diagnosis present

## 2014-05-28 DIAGNOSIS — F068 Other specified mental disorders due to known physiological condition: Secondary | ICD-10-CM

## 2014-05-28 DIAGNOSIS — Z1621 Resistance to vancomycin: Secondary | ICD-10-CM

## 2014-05-28 DIAGNOSIS — N182 Chronic kidney disease, stage 2 (mild): Secondary | ICD-10-CM

## 2014-05-28 LAB — CBC WITH DIFFERENTIAL/PLATELET
Basophils Absolute: 0 10*3/uL (ref 0.0–0.1)
Basophils Relative: 0 % (ref 0–1)
EOS ABS: 0 10*3/uL (ref 0.0–0.7)
EOS PCT: 0 % (ref 0–5)
HEMATOCRIT: 41.8 % (ref 39.0–52.0)
Hemoglobin: 13.8 g/dL (ref 13.0–17.0)
LYMPHS ABS: 0.4 10*3/uL — AB (ref 0.7–4.0)
LYMPHS PCT: 3 % — AB (ref 12–46)
MCH: 31 pg (ref 26.0–34.0)
MCHC: 33 g/dL (ref 30.0–36.0)
MCV: 93.9 fL (ref 78.0–100.0)
MONO ABS: 0.4 10*3/uL (ref 0.1–1.0)
Monocytes Relative: 3 % (ref 3–12)
Neutro Abs: 11 10*3/uL — ABNORMAL HIGH (ref 1.7–7.7)
Neutrophils Relative %: 94 % — ABNORMAL HIGH (ref 43–77)
Platelets: 163 10*3/uL (ref 150–400)
RBC: 4.45 MIL/uL (ref 4.22–5.81)
RDW: 13.2 % (ref 11.5–15.5)
WBC: 11.7 10*3/uL — AB (ref 4.0–10.5)

## 2014-05-28 LAB — MRSA PCR SCREENING: MRSA BY PCR: NEGATIVE

## 2014-05-28 LAB — URINALYSIS, ROUTINE W REFLEX MICROSCOPIC
Glucose, UA: 500 mg/dL — AB
Hgb urine dipstick: NEGATIVE
Ketones, ur: NEGATIVE mg/dL
LEUKOCYTES UA: NEGATIVE
NITRITE: NEGATIVE
PROTEIN: NEGATIVE mg/dL
Specific Gravity, Urine: 1.013 (ref 1.005–1.030)
UROBILINOGEN UA: 4 mg/dL — AB (ref 0.0–1.0)
pH: 7 (ref 5.0–8.0)

## 2014-05-28 LAB — GLUCOSE, CAPILLARY
Glucose-Capillary: 237 mg/dL — ABNORMAL HIGH (ref 70–99)
Glucose-Capillary: 209 mg/dL — ABNORMAL HIGH (ref 70–99)

## 2014-05-28 LAB — COMPREHENSIVE METABOLIC PANEL
ALBUMIN: 3 g/dL — AB (ref 3.5–5.2)
ALK PHOS: 361 U/L — AB (ref 39–117)
ALT: 179 U/L — ABNORMAL HIGH (ref 0–53)
ANION GAP: 19 — AB (ref 5–15)
AST: 580 U/L — AB (ref 0–37)
BUN: 13 mg/dL (ref 6–23)
CO2: 22 mEq/L (ref 19–32)
CREATININE: 1.1 mg/dL (ref 0.50–1.35)
Calcium: 9.5 mg/dL (ref 8.4–10.5)
Chloride: 97 mEq/L (ref 96–112)
GFR calc Af Amer: 69 mL/min — ABNORMAL LOW (ref >90)
GFR calc non Af Amer: 60 mL/min — ABNORMAL LOW (ref >90)
Glucose, Bld: 326 mg/dL — ABNORMAL HIGH (ref 70–99)
POTASSIUM: 4.3 meq/L (ref 3.7–5.3)
Sodium: 138 mEq/L (ref 137–147)
Total Bilirubin: 1.9 mg/dL — ABNORMAL HIGH (ref 0.3–1.2)
Total Protein: 6.7 g/dL (ref 6.0–8.3)

## 2014-05-28 LAB — I-STAT ARTERIAL BLOOD GAS, ED
Acid-Base Excess: 1 mmol/L (ref 0.0–2.0)
Bicarbonate: 24 mEq/L (ref 20.0–24.0)
O2 Saturation: 96 %
PH ART: 7.422 (ref 7.350–7.450)
TCO2: 25 mmol/L (ref 0–100)
pCO2 arterial: 37.7 mmHg (ref 35.0–45.0)
pO2, Arterial: 88 mmHg (ref 80.0–100.0)

## 2014-05-28 LAB — LIPASE, BLOOD: LIPASE: 27 U/L (ref 11–59)

## 2014-05-28 LAB — CBG MONITORING, ED: Glucose-Capillary: 221 mg/dL — ABNORMAL HIGH (ref 70–99)

## 2014-05-28 LAB — PRO B NATRIURETIC PEPTIDE: PRO B NATRI PEPTIDE: 434.4 pg/mL (ref 0–450)

## 2014-05-28 LAB — TROPONIN I: Troponin I: <0.3 ng/mL (ref <0.30)

## 2014-05-28 LAB — I-STAT CG4 LACTIC ACID, ED: LACTIC ACID, VENOUS: 3.8 mmol/L — AB (ref 0.5–2.2)

## 2014-05-28 MED ORDER — POLYETHYL GLYCOL-PROPYL GLYCOL 0.4-0.3 % OP SOLN: 1.0000 [drp] | Freq: Every day | OPHTHALMIC | Status: DC | PRN

## 2014-05-28 MED ORDER — SODIUM CHLORIDE 0.9 % IV SOLN
INTRAVENOUS | Status: DC
  Administered 2014-05-29 – 2014-05-31 (×5): via INTRAVENOUS

## 2014-05-28 MED ORDER — IOHEXOL 300 MG/ML  SOLN
100.0000 mL | Freq: Once | INTRAMUSCULAR | Status: AC | PRN
  Administered 2014-05-28: 100 mL via INTRAVENOUS

## 2014-05-28 MED ORDER — ASPIRIN 325 MG PO TABS
162.5000 mg | ORAL_TABLET | Freq: Every day | ORAL | Status: DC
  Administered 2014-05-28: 16.25 mg via ORAL
  Filled 2014-05-28 (×2): qty 0.5

## 2014-05-28 MED ORDER — AZTREONAM 1 G IJ SOLR
INTRAMUSCULAR | Status: AC
  Filled 2014-05-28: qty 2

## 2014-05-28 MED ORDER — VANCOMYCIN HCL IN DEXTROSE 1-5 GM/200ML-% IV SOLN
1000.0000 mg | Freq: Once | INTRAVENOUS | Status: AC
  Administered 2014-05-28: 1000 mg via INTRAVENOUS
  Filled 2014-05-28: qty 200

## 2014-05-28 MED ORDER — OMEPRAZOLE MAGNESIUM 20 MG PO TBEC: 20.0000 mg | DELAYED_RELEASE_TABLET | Freq: Two times a day (BID) | ORAL | Status: DC

## 2014-05-28 MED ORDER — POLYVINYL ALCOHOL 1.4 % OP SOLN
1.0000 [drp] | OPHTHALMIC | Status: DC | PRN
  Filled 2014-05-28: qty 15

## 2014-05-28 MED ORDER — POLYETHYLENE GLYCOL 3350 17 G PO PACK
17.0000 g | PACK | Freq: Every day | ORAL | Status: DC
  Administered 2014-05-28 – 2014-06-05 (×9): 17 g via ORAL
  Filled 2014-05-28 (×9): qty 1

## 2014-05-28 MED ORDER — ALBUTEROL SULFATE HFA 108 (90 BASE) MCG/ACT IN AERS: 2.0000 | INHALATION_SPRAY | RESPIRATORY_TRACT | Status: DC | PRN

## 2014-05-28 MED ORDER — IOHEXOL 300 MG/ML  SOLN
50.0000 mL | Freq: Once | INTRAMUSCULAR | Status: AC | PRN
  Administered 2014-05-28: 50 mL via ORAL

## 2014-05-28 MED ORDER — SODIUM CHLORIDE 0.9 % IJ SOLN
3.0000 mL | Freq: Two times a day (BID) | INTRAMUSCULAR | Status: DC
  Administered 2014-05-28 – 2014-06-04 (×13): 3 mL via INTRAVENOUS

## 2014-05-28 MED ORDER — DEXTROSE 5 % IV SOLN
2.0000 g | Freq: Once | INTRAVENOUS | Status: AC
  Administered 2014-05-28: 2 g via INTRAVENOUS

## 2014-05-28 MED ORDER — DEXTROSE 5 % IV SOLN
1.0000 g | Freq: Three times a day (TID) | INTRAVENOUS | Status: DC
  Filled 2014-05-28: qty 1

## 2014-05-28 MED ORDER — ACETAMINOPHEN 325 MG PO TABS
650.0000 mg | ORAL_TABLET | Freq: Once | ORAL | Status: AC
  Administered 2014-05-28: 650 mg via ORAL

## 2014-05-28 MED ORDER — SIMVASTATIN 20 MG PO TABS
20.0000 mg | ORAL_TABLET | Freq: Every evening | ORAL | Status: DC
  Administered 2014-05-28 – 2014-06-04 (×8): 20 mg via ORAL
  Filled 2014-05-28 (×9): qty 1

## 2014-05-28 MED ORDER — ACETAMINOPHEN 325 MG PO TABS: 650.0000 mg | ORAL_TABLET | Freq: Four times a day (QID) | ORAL | Status: DC | PRN

## 2014-05-28 MED ORDER — LEVOFLOXACIN IN D5W 750 MG/150ML IV SOLN
750.0000 mg | INTRAVENOUS | Status: DC
  Filled 2014-05-28: qty 150

## 2014-05-28 MED ORDER — SODIUM CHLORIDE 0.9 % IV SOLN
INTRAVENOUS | Status: DC
  Administered 2014-05-28: 18:00:00 via INTRAVENOUS

## 2014-05-28 MED ORDER — DONEPEZIL HCL 10 MG PO TABS
10.0000 mg | ORAL_TABLET | Freq: Every day | ORAL | Status: DC
  Administered 2014-05-28 – 2014-06-04 (×8): 10 mg via ORAL
  Filled 2014-05-28 (×9): qty 1

## 2014-05-28 MED ORDER — ALBUTEROL SULFATE (2.5 MG/3ML) 0.083% IN NEBU: 2.5000 mg | INHALATION_SOLUTION | RESPIRATORY_TRACT | Status: DC | PRN

## 2014-05-28 MED ORDER — VANCOMYCIN HCL IN DEXTROSE 750-5 MG/150ML-% IV SOLN
750.0000 mg | Freq: Two times a day (BID) | INTRAVENOUS | Status: DC
  Administered 2014-05-29 – 2014-05-31 (×5): 750 mg via INTRAVENOUS
  Filled 2014-05-28 (×6): qty 150

## 2014-05-28 MED ORDER — BUDESONIDE-FORMOTEROL FUMARATE 160-4.5 MCG/ACT IN AERO
1.0000 | INHALATION_SPRAY | Freq: Two times a day (BID) | RESPIRATORY_TRACT | Status: DC
  Administered 2014-05-28 – 2014-06-04 (×13): 1 via RESPIRATORY_TRACT
  Filled 2014-05-28 (×3): qty 6

## 2014-05-28 MED ORDER — PANTOPRAZOLE SODIUM 20 MG PO TBEC
20.0000 mg | DELAYED_RELEASE_TABLET | Freq: Two times a day (BID) | ORAL | Status: DC
  Administered 2014-05-28 – 2014-06-05 (×16): 20 mg via ORAL
  Filled 2014-05-28 (×17): qty 1

## 2014-05-28 MED ORDER — TIOTROPIUM BROMIDE MONOHYDRATE 18 MCG IN CAPS
18.0000 ug | ORAL_CAPSULE | Freq: Every day | RESPIRATORY_TRACT | Status: DC
  Administered 2014-05-28 – 2014-06-03 (×5): 18 ug via RESPIRATORY_TRACT
  Filled 2014-05-28 (×3): qty 5

## 2014-05-28 MED ORDER — INSULIN ASPART 100 UNIT/ML ~~LOC~~ SOLN
0.0000 [IU] | Freq: Three times a day (TID) | SUBCUTANEOUS | Status: DC
  Administered 2014-05-29 – 2014-05-30 (×2): 3 [IU] via SUBCUTANEOUS
  Administered 2014-05-30 – 2014-05-31 (×2): 2 [IU] via SUBCUTANEOUS
  Administered 2014-05-31 – 2014-06-01 (×2): 3 [IU] via SUBCUTANEOUS
  Administered 2014-06-02: 2 [IU] via SUBCUTANEOUS
  Administered 2014-06-02: 3 [IU] via SUBCUTANEOUS
  Administered 2014-06-02 – 2014-06-03 (×3): 2 [IU] via SUBCUTANEOUS
  Administered 2014-06-03 – 2014-06-04 (×4): 3 [IU] via SUBCUTANEOUS
  Administered 2014-06-05: 2 [IU] via SUBCUTANEOUS

## 2014-05-28 MED ORDER — SODIUM CHLORIDE 0.9 % IV BOLUS (SEPSIS)
500.0000 mL | Freq: Once | INTRAVENOUS | Status: AC
  Administered 2014-05-28: 500 mL via INTRAVENOUS

## 2014-05-28 MED ORDER — IPRATROPIUM-ALBUTEROL 0.5-2.5 (3) MG/3ML IN SOLN: 3.0000 mL | Freq: Four times a day (QID) | RESPIRATORY_TRACT | Status: DC | PRN

## 2014-05-28 MED ORDER — FOLIC ACID 1 MG PO TABS
1.0000 mg | ORAL_TABLET | Freq: Every day | ORAL | Status: DC
  Administered 2014-05-28 – 2014-06-05 (×9): 1 mg via ORAL
  Filled 2014-05-28 (×9): qty 1

## 2014-05-28 MED ORDER — MEMANTINE HCL ER 28 MG PO CP24
28.0000 mg | ORAL_CAPSULE | Freq: Every day | ORAL | Status: DC
  Administered 2014-05-28 – 2014-06-05 (×9): 28 mg via ORAL
  Filled 2014-05-28 (×9): qty 28

## 2014-05-28 MED ORDER — INSULIN ASPART 100 UNIT/ML ~~LOC~~ SOLN
0.0000 [IU] | Freq: Every day | SUBCUTANEOUS | Status: DC
  Administered 2014-05-28: 2 [IU] via SUBCUTANEOUS

## 2014-05-28 MED ORDER — ACETAMINOPHEN 325 MG PO TABS
ORAL_TABLET | ORAL | Status: AC
  Filled 2014-05-28: qty 2

## 2014-05-28 MED ORDER — HEPARIN SODIUM (PORCINE) 5000 UNIT/ML IJ SOLN
5000.0000 [IU] | Freq: Three times a day (TID) | INTRAMUSCULAR | Status: DC
  Administered 2014-05-28 – 2014-06-05 (×23): 5000 [IU] via SUBCUTANEOUS
  Filled 2014-05-28 (×27): qty 1

## 2014-05-28 MED ORDER — INSULIN ASPART 100 UNIT/ML ~~LOC~~ SOLN: 0.0000 [IU] | SUBCUTANEOUS | Status: DC

## 2014-05-28 MED ORDER — LEVOFLOXACIN IN D5W 750 MG/150ML IV SOLN
750.0000 mg | Freq: Once | INTRAVENOUS | Status: AC
  Administered 2014-05-28: 750 mg via INTRAVENOUS
  Filled 2014-05-28: qty 150

## 2014-05-28 MED ORDER — DEXTROSE 5 % IV SOLN
2.0000 g | Freq: Three times a day (TID) | INTRAVENOUS | Status: DC
  Administered 2014-05-29 – 2014-05-31 (×7): 2 g via INTRAVENOUS
  Filled 2014-05-28 (×8): qty 2

## 2014-05-28 NOTE — Progress Notes (Signed)
BP 119/68  Pulse 101  Temp(Src) 103.4 F (39.7 C) (Rectal)  Resp 30  Wt 89.16 kg (196 lb 9 oz)  SpO19 16% 80 year old male with past medical history of biliary stones status post percutaneous drainage removal, advanced dementia that comes to the emergency room for several episodes of vomiting and some abdominal pain. In the ED he was found tachycardic and satting in the 90s with temperature of 103. Sepsis workup was initiated was started on Bank Levaquin and history of (due to penicillin allergies). He was given 2 L of normal saline, chest x-ray shows no infiltrates UA shows no significant signs of infection. However LFTs he seems to be elevated with a calculated anion gap of about 20, and a bilirubin of 1.9, lactic acid of 3.8. CT scan of the abdomen shows no obvious source of infection. He is a DNR/DNI. We are consulted for admission to step down in further evaluation.

## 2014-05-28 NOTE — ED Notes (Signed)
Attempt to call report nurse not available 

## 2014-05-28 NOTE — ED Provider Notes (Signed)
CSN: [id number]     Arrival date & time 05/28/14  1326 History   First MD Initiated Contact with Patient 05/28/14 1332     Chief Complaint  Patient presents with  . Shortness of Breath     (Consider location/radiation/quality/duration/timing/severity/associated sxs/prior Treatment) Patient is a 80 y.o. male presenting with shortness of breath. The history is provided by the patient. No language interpreter was used.  Shortness of Breath Associated symptoms comment:  History is per wife as patient has advanced dementia and cannot contribute reliably. She states he was mowing the grass earlier today using the riding mower, and appeared to be out of breath when he came to a stop. He did not complain of pain. She states he was shaking like he was chilled. There has been no recent illness, known fever, cough, vomiting or change in appetite.    Past Medical History  Diagnosis Date  . Chest discomfort   . Coronary artery disease   . Diabetes mellitus   . Hypertension   . Hyperlipidemia   . Obesities, morbid   . Gallstone pancreatitis     recurrent  . TIA (transient ischemic attack)   . Bacteremia due to vancomycin resistant Enterococcus   . GERD (gastroesophageal reflux disease)   . Hearing impairment   . Myocardial infarction, anterior wall, subsequent care   . Second degree AV block, Mobitz type II     s/p PPM by JA 4/12  . ARF (acute renal failure)   . Cholangitis   . Duodenal ulcer   . GI bleed   . Anemia associated with acute blood loss   . 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 Dementia in Alzheimer's disease with delirium    Past Surgical History  Procedure Laterality Date  . Cholecystectomy    . Pacemaker placement  01/01/11    implanted by JA for mobitz II AV block  . Pancreatic stent     Family History  Problem Relation Age of Onset  . Hypertension Other    History  Substance Use Topics  . Smoking status: Former Smoker -- 1.00  packs/day for 57 years    Types: Cigarettes    Quit date: 01/12/1996  . Smokeless tobacco: Never Used  . Alcohol Use: No    Review of Systems  Unable to perform ROS: Dementia      Allergies  Doxycycline and Penicillins  Home Medications   Prior to Admission medications   Medication Sig Start Date End Date Taking? Authorizing Provider  albuterol (PROVENTIL HFA;VENTOLIN HFA) 108 (90 BASE) MCG/ACT inhaler Inhale 2 puffs into the lungs every 4 (four) hours as needed. For shortness of breath    Historical Provider, MD  aspirin 325 MG tablet Take 162.5 mg by mouth daily.     Historical Provider, MD  budesonide-formoterol (SYMBICORT) 160-4.5 MCG/ACT inhaler Inhale 1 puff into the lungs 2 (two) times daily. 02/26/13   Chesley Mires, MD  donepezil (ARICEPT) 10 MG tablet Take 1 tablet (10 mg total) by mouth at bedtime. 04/26/14   Penni Bombard, MD  folic acid (FOLVITE) 1 MG tablet Take 1 mg by mouth daily.    Historical Provider, MD  hydrochlorothiazide (HYDRODIURIL) 25 MG tablet Take 12.5 mg by mouth daily.    Historical Provider, MD  ipratropium-albuterol (DUONEB) 0.5-2.5 (3) MG/3ML SOLN Take 3 mLs by nebulization every 6 (six) hours as needed. For shortness of breath    Historical Provider, MD  losartan (COZAAR) 50 MG tablet  Take 1 tablet (50 mg total) by mouth daily. 11/26/12   [name], MD  MAGNESIUM SULFATE PO Take 1 tablet by mouth daily.    Historical Provider, MD  Memantine HCl ER (NAMENDA XR) 28 MG CP24 Take 28 mg by mouth daily.     Historical Provider, MD  metoprolol (LOPRESSOR) 50 MG tablet TAKE 1 TABLET TWICE A DAY    [name], MD  Multiple Vitamins-Minerals (CENTRUM SILVER PO) Take 1 tablet by mouth every morning.     Historical Provider, MD  NAMENDA XR 28 MG CP24 TAKE ONE CAPSULE BY MOUTH EVERY DAY 05/17/14   Penni Bombard, MD  Omega-3 Fatty Acids (FISH OIL PO) Take 2 capsules by mouth daily.    Historical Provider, MD  omeprazole (PRILOSEC OTC) 20 MG tablet Take  20 mg by mouth 2 (two) times daily.    Historical Provider, MD  Polyethyl Glycol-Propyl Glycol (SYSTANE) 0.4-0.3 % SOLN Apply 1 drop to eye daily as needed (for redness).    Historical Provider, MD  polyethylene glycol (MIRALAX / GLYCOLAX) packet Take 17 g by mouth daily.    Historical Provider, MD  simvastatin (ZOCOR) 20 MG tablet Take 20 mg by mouth every evening.     Historical Provider, MD  tiotropium (SPIRIVA) 18 MCG inhalation capsule Place 18 mcg into inhaler and inhale daily.    Historical Provider, MD   BP 168/93  Pulse 119  Temp(Src) 99 F (37.2 C) (Oral)  SpO2 96% Physical Exam  Constitutional: He appears well-developed and well-nourished. No distress.  He is alert and tracking all movements.  HENT:  Head: Normocephalic and atraumatic.  Mouth/Throat: Oropharynx is clear and moist.  Eyes: Conjunctivae are normal.  Neck: Normal range of motion.  Cardiovascular: Regular rhythm.  Tachycardia present.   No murmur heard. Pulmonary/Chest: He exhibits no tenderness.  Expiratory greater than inspiratory wheezing. He is grunting on exhalation, usual per wife, and does not appear to be in respiratory distress.   Abdominal: Soft. Bowel sounds are normal. There is no tenderness.  Musculoskeletal: Normal range of motion. He exhibits edema.  1+ bilateral LE extremities.  Neurological: He is alert. Coordination normal.  Skin: Skin is warm and dry. He is not diaphoretic.    ED Course  Procedures (including critical care time) Labs Review Labs Reviewed  CULTURE, BLOOD (ROUTINE X 2)  CULTURE, BLOOD (ROUTINE X 2)  TROPONIN I  CBC WITH DIFFERENTIAL  COMPREHENSIVE METABOLIC PANEL  PRO B NATRIURETIC PEPTIDE  LACTIC ACID, PLASMA  URINALYSIS, ROUTINE W REFLEX MICROSCOPIC   Results for orders placed during the hospital encounter of 05/28/14  MRSA PCR SCREENING      Result Value Ref Range   MRSA by PCR NEGATIVE  NEGATIVE  TROPONIN I      Result Value Ref Range   Troponin I <0.30   <0.30 ng/mL  CBC WITH DIFFERENTIAL      Result Value Ref Range   WBC 11.7 (*) 4.0 - 10.5 K/uL   RBC 4.45  4.22 - 5.81 MIL/uL   Hemoglobin 13.8  13.0 - 17.0 g/dL   HCT 41.8  39.0 - 52.0 %   MCV 93.9  78.0 - 100.0 fL   MCH 31.0  26.0 - 34.0 pg   MCHC 33.0  30.0 - 36.0 g/dL   RDW 13.2  11.5 - 15.5 %   Platelets 163  150 - 400 K/uL   Neutrophils Relative % 94 (*) 43 - 77 %   Neutro Abs  11.0 (*) 1.7 - 7.7 K/uL   Lymphocytes Relative 3 (*) 12 - 46 %   Lymphs Abs 0.4 (*) 0.7 - 4.0 K/uL   Monocytes Relative 3  3 - 12 %   Monocytes Absolute 0.4  0.1 - 1.0 K/uL   Eosinophils Relative 0  0 - 5 %   Eosinophils Absolute 0.0  0.0 - 0.7 K/uL   Basophils Relative 0  0 - 1 %   Basophils Absolute 0.0  0.0 - 0.1 K/uL  COMPREHENSIVE METABOLIC PANEL      Result Value Ref Range   Sodium 138  137 - 147 mEq/L   Potassium 4.3  3.7 - 5.3 mEq/L   Chloride 97  96 - 112 mEq/L   CO2 22  19 - 32 mEq/L   Glucose, Bld 326 (*) 70 - 99 mg/dL   BUN 13  6 - 23 mg/dL   Creatinine, Ser 1.10  0.50 - 1.35 mg/dL   Calcium 9.5  8.4 - 10.5 mg/dL   Total Protein 6.7  6.0 - 8.3 g/dL   Albumin 3.0 (*) 3.5 - 5.2 g/dL   AST 580 (*) 0 - 37 U/L   ALT 179 (*) 0 - 53 U/L   Alkaline Phosphatase 361 (*) 39 - 117 U/L   Total Bilirubin 1.9 (*) 0.3 - 1.2 mg/dL   GFR calc non Af Amer 60 (*) >90 mL/min   GFR calc Af Amer 69 (*) >90 mL/min   Anion gap 19 (*) 5 - 15  PRO B NATRIURETIC PEPTIDE      Result Value Ref Range   Pro B Natriuretic peptide (BNP) 434.4  0 - 450 pg/mL  URINALYSIS, ROUTINE W REFLEX MICROSCOPIC      Result Value Ref Range   Color, Urine YELLOW  YELLOW   APPearance CLEAR  CLEAR   Specific Gravity, Urine 1.013  1.005 - 1.030   pH 7.0  5.0 - 8.0   Glucose, UA 500 (*) NEGATIVE mg/dL   Hgb urine dipstick NEGATIVE  NEGATIVE   Bilirubin Urine SMALL (*) NEGATIVE   Ketones, ur NEGATIVE  NEGATIVE mg/dL   Protein, ur NEGATIVE  NEGATIVE mg/dL   Urobilinogen, UA 4.0 (*) 0.0 - 1.0 mg/dL   Nitrite NEGATIVE  NEGATIVE    Leukocytes, UA NEGATIVE  NEGATIVE  LIPASE, BLOOD      Result Value Ref Range   Lipase 27  11 - 59 U/L  GLUCOSE, CAPILLARY      Result Value Ref Range   Glucose-Capillary 237 (*) 70 - 99 mg/dL  CBC      Result Value Ref Range   WBC 14.5 (*) 4.0 - 10.5 K/uL   RBC 3.67 (*) 4.22 - 5.81 MIL/uL   Hemoglobin 11.5 (*) 13.0 - 17.0 g/dL   HCT 35.0 (*) 39.0 - 52.0 %   MCV 95.4  78.0 - 100.0 fL   MCH 31.3  26.0 - 34.0 pg   MCHC 32.9  30.0 - 36.0 g/dL   RDW 13.6  11.5 - 15.5 %   Platelets 162  150 - 400 K/uL  COMPREHENSIVE METABOLIC PANEL      Result Value Ref Range   Sodium 139  137 - 147 mEq/L   Potassium 4.3  3.7 - 5.3 mEq/L   Chloride 101  96 - 112 mEq/L   CO2 27  19 - 32 mEq/L   Glucose, Bld 166 (*) 70 - 99 mg/dL   BUN 15  6 - 23 mg/dL  Creatinine, Ser 1.28  0.50 - 1.35 mg/dL   Calcium 8.4  8.4 - 10.5 mg/dL   Total Protein 5.2 (*) 6.0 - 8.3 g/dL   Albumin 2.3 (*) 3.5 - 5.2 g/dL   AST 476 (*) 0 - 37 U/L   ALT 239 (*) 0 - 53 U/L   Alkaline Phosphatase 258 (*) 39 - 117 U/L   Total Bilirubin 2.6 (*) 0.3 - 1.2 mg/dL   GFR calc non Af Amer 50 (*) >90 mL/min   GFR calc Af Amer 58 (*) >90 mL/min   Anion gap 11  5 - 15  GLUCOSE, CAPILLARY      Result Value Ref Range   Glucose-Capillary 209 (*) 70 - 99 mg/dL  GLUCOSE, CAPILLARY      Result Value Ref Range   Glucose-Capillary 166 (*) 70 - 99 mg/dL   Comment 1 Documented in Chart     Comment 2 Notify RN    I-STAT ARTERIAL BLOOD GAS, ED      Result Value Ref Range   pH, Arterial 7.422  7.350 - 7.450   pCO2 arterial 37.7  35.0 - 45.0 mmHg   pO2, Arterial 88.0  80.0 - 100.0 mmHg   Bicarbonate 24.0  20.0 - 24.0 mEq/L   TCO2 25  0 - 100 mmol/L   O2 Saturation 96.0     Acid-Base Excess 1.0  0.0 - 2.0 mmol/L   Patient temperature 103.4 F     Sample type ARTERIAL    I-STAT CG4 LACTIC ACID, ED      Result Value Ref Range   Lactic Acid, Venous 3.80 (*) 0.5 - 2.2 mmol/L  CBG MONITORING, ED      Result Value Ref Range    Glucose-Capillary 221 (*) 70 - 99 mg/dL     Imaging Review No results found.   EKG Interpretation   Date/Time:  Friday May 28 2014 13:33:00 EDT Ventricular Rate:  121 PR Interval:    QRS Duration: 110 QT Interval:  360 QTC Calculation: 511 R Axis:   -75 Text Interpretation:  Sinus tachycardia Left axis deviation Anterolateral  infarct , age undetermined Q waves prominent anteriorly Abnormal ECG  Confirmed by HORTON  MD, Loma Sousa ([id number]) on 05/28/2014 1:49:40 PM      MDM   Final diagnoses:  None    1. Sepsis  He arrives with 87% RA saturation, tachycardic, tachypneic, although awake and alert. Sepsis is suspected, blood pressure stable. CXR clear. Started on treatment for undifferentiated sepsis with Levaquin, aztreonam, and vancomycin. CT scan performed secondary to liver abnormalities on labs. Patient unable to give history of abdominal pain or history. He has remained nonverbal through coarse of ED encounter.  Discussed admission with [facility] - Dr. Venetia Constable accepting.    Dewaine Oats, PA-C 05/29/14 1211

## 2014-05-28 NOTE — ED Notes (Signed)
Pt to room 1 in w/c, assisted to bed x 2 max assist. Pt using accessory muscles, dyspnea noted with minimal exertion. Initial room air sats 87%, rt at bedside, pt placed on o2, ekg in progress. Pt wife states pt was mowing grass this morning and afterwards he clutched the left side of his chest and stated "I've got the awfulest pain there!" per pt wife pt has dementia and is baseline confused x 4. Iv access in progress during triage.

## 2014-05-28 NOTE — Progress Notes (Signed)
ANTIBIOTIC CONSULT NOTE - INITIAL  Pharmacy Consult for Vancomycin, Levaquin, Aztreonam Indication: rule out sepsis  Allergies  Allergen Reactions  . Doxycycline Other (See Comments)    Unknown- patient doesn't remember  . Penicillins Rash    Patient Measurements:   Actual Body Weight: 97.1 kg Ideal Body Weight: 66 kg Adjusted Body Weight: 78.5 kg  Vital Signs: Temp: 103.4 F (39.7 C) (09/04 1418) Temp src: Rectal (09/04 1418) BP: 151/76 mmHg (09/04 1407) Pulse Rate: 121 (09/04 1407) Intake/Output from previous day:   Intake/Output from this shift: Total I/O In: -  Out: 30 [Urine:30]  Labs:  Recent Labs  05/28/14 1400  WBC 11.7*  HGB 13.8  PLT 163  CREATININE 1.10   The CrCl is unknown because both a height and weight (above a minimum accepted value) are required for this calculation. No results found for this basename: VANCOTROUGH, VANCOPEAK, VANCORANDOM, GENTTROUGH, GENTPEAK, GENTRANDOM, TOBRATROUGH, TOBRAPEAK, TOBRARND, AMIKACINPEAK, AMIKACINTROU, AMIKACIN,  in the last 72 hours   Microbiology: No results found for this or any previous visit (from the past 720 hour(s)).  Medical History: Past Medical History  Diagnosis Date  . Chest discomfort   . Coronary artery disease   . Diabetes mellitus   . Hypertension   . Hyperlipidemia   . Obesities, morbid   . Gallstone pancreatitis     recurrent  . TIA (transient ischemic attack)   . Bacteremia due to vancomycin resistant Enterococcus   . GERD (gastroesophageal reflux disease)   . Hearing impairment   . Myocardial infarction, anterior wall, subsequent care   . Second degree AV block, Mobitz type II     s/p PPM by JA 4/12  . ARF (acute renal failure)   . Cholangitis   . Duodenal ulcer   . GI bleed   . Anemia associated with acute blood loss   . 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 Dementia in Alzheimer's disease with delirium     Medications:  Scheduled:    Infusions:  . aztreonam    . levofloxacin (LEVAQUIN) IV    . sodium chloride    . vancomycin     Assessment: [name] presented to [facility] with SOB. Pt is febrile, tachycardic, WBC 11.7, sCr 1.1, and allergy to PCNs. Will start vancomycin, levaquin, and aztreonam for sepsis rule-out.  Goal of Therapy:  Vancomycin trough level 15-20 mcg/ml  Plan:  Start Vancomycin 750mg  IV q12h Start Levaquin 750mg  IV q24h Start Aztreonam 2g IV q8h Measure antibiotic drug levels at steady state Follow up culture results, renal function and QTc  [name]. [name], PharmD Clinical Pharmacist Pager [phone] 05/28/2014,3:49 PM

## 2014-05-28 NOTE — ED Provider Notes (Signed)
Medical screening examination/treatment/procedure(s) were conducted as a shared visit with non-physician practitioner(s) and myself.  I personally evaluated the patient during the encounter.   EKG Interpretation   Date/Time:  Friday May 28 2014 13:33:00 EDT Ventricular Rate:  121 PR Interval:    QRS Duration: 110 QT Interval:  360 QTC Calculation: 511 R Axis:   -75 Text Interpretation:  Sinus tachycardia Left axis deviation Anterolateral  infarct , age undetermined Q waves prominent anteriorly Abnormal ECG  Confirmed by Danali Marinos  MD, Loma Sousa ([id number]) on 05/28/2014 1:49:40 PM      Patient presents with chest pain and tachypnea. Per the patient's wife, he has advanced dementia. He grabbed his chest earlier today stating that it hurts. He subsequently had several episodes of vomiting. He is noncontributory to history taking. Currently he is tachypneic and tachycardic. Appears to be sinus tachycardia. He has wheezing on exam. Has a history of COPD. Rectal temperature of 103. Sepsis workup initiated. Last EF of 55-60%. Patient was given 2 L of fluid. Chest x-ray without evidence of pneumonia and urinalysis without overt infection. However, patient's LFTs are elevated. Will obtain CT scan of the abdomen. Given unknown source, patient was given vancomycin, aztreonam, and Levaquin based on and source. Lactate is mildly elevated at 3.  Patient has a living will in our system which indicates that he has a desire for "a natural death." Patient's wife is at the bedside and agrees that the patient would not want advance measures including CPR or intubation. He is DO NOT RESUSCITATE/DO NOT INTUBATE. CT abdomen pending. Anticipate admission. Prognosis guarded.  Angiocath insertion Performed by: Thayer Jew, F  Consent: Verbal consent obtained. Risks and benefits: risks, benefits and alternatives were discussed Time out: Immediately prior to procedure a "time out" was called to verify the correct  patient, procedure, equipment, support staff and site/side marked as required.  Preparation: Patient was prepped and draped in the usual sterile fashion.  Vein Location: left basilic  Ultrasound Guided  Gauge: 20  Normal blood return and flush without difficulty Patient tolerance: Patient tolerated the procedure well with no immediate complications.   CRITICAL CARE Performed by: Thayer Jew, F   Total critical care time: 45 min  Critical care time was exclusive of separately billable procedures and treating other patients.  Critical care was necessary to treat or prevent imminent or life-threatening deterioration.  Critical care was time spent personally by me on the following activities: development of treatment plan with patient and/or surrogate as well as nursing, discussions with consultants, evaluation of patient's response to treatment, examination of patient, obtaining history from patient or surrogate, ordering and performing treatments and interventions, ordering and review of laboratory studies, ordering and review of radiographic studies, pulse oximetry and re-evaluation of patient's condition.   Merryl Hacker, MD 05/28/14 1556

## 2014-05-28 NOTE — ED Provider Notes (Signed)
Patient is awake alert says his name pulse in the 84R systolic blood pressure normal pulse oximetry 99% on nasal oxygen [facility] is here for transfer; The patient appears reasonably stabilized for transfer considering the current resources, flow, and capabilities available in the ED at this time, and I doubt any other [facility] requiring further screening and/or treatment in the ED prior to transfer. 1925  Babette Relic, MD 05/29/14 [phone]

## 2014-05-28 NOTE — ED Notes (Signed)
Patient transported to CT 

## 2014-05-28 NOTE — ED Notes (Signed)
MD at bedside. 

## 2014-05-28 NOTE — H&P (Addendum)
[facility] History and Physical  AISON MALVEAUX POE:[id number] DOB: June 24, 1927 DOA: 05/28/2014  Referring physician: EDP PCP: Stephens Shire, MD   Chief Complaint: Vomiting and abdominal pain   HPI: KEILAND PICKERING is a 80 y.o. male with pmh of biliary stones s/p perc drainage and removal.  Presents to ED at [facility] with several episodes of vomiting and some abdominal pain.  In ED patient was found to be septic with T of 103.4, tachycardic, tachypnic.  Started on levaquin, azactam, and vancomycin.  2L IVF resuscitation.  CXR negative for infiltrated, UA negative, CT abd/pelvis negative for source of infection.  Review of Systems: patient now awake, demented, hard of hearing, denying SOB or abdominal pain but not able to provide much history.  Past Medical History  Diagnosis Date  . Chest discomfort   . Coronary artery disease   . Diabetes mellitus   . Hypertension   . Hyperlipidemia   . Obesities, morbid   . Gallstone pancreatitis     recurrent  . TIA (transient ischemic attack)   . Bacteremia due to vancomycin resistant Enterococcus   . GERD (gastroesophageal reflux disease)   . Hearing impairment   . Myocardial infarction, anterior wall, subsequent care   . Second degree AV block, Mobitz type II     s/p PPM by JA 4/12  . ARF (acute renal failure)   . Cholangitis   . Duodenal ulcer   . GI bleed   . Anemia associated with acute blood loss   . 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 Dementia in Alzheimer's disease with delirium    Past Surgical History  Procedure Laterality Date  . Cholecystectomy    . Pacemaker placement  01/01/11    implanted by JA for mobitz II AV block  . Pancreatic stent     Social History:  reports that he quit smoking about 18 years ago. His smoking use included Cigarettes. He has a 57 pack-year smoking history. He has never used smokeless tobacco. He reports that he does not drink alcohol or use illicit  drugs.  Allergies  Allergen Reactions  . Doxycycline Other (See Comments)    Unknown- patient doesn't remember  . Penicillins Rash    Family History  Problem Relation Age of Onset  . Hypertension Other      Prior to Admission medications   Medication Sig Start Date End Date Taking? Authorizing Provider  albuterol (PROVENTIL HFA;VENTOLIN HFA) 108 (90 BASE) MCG/ACT inhaler Inhale 2 puffs into the lungs every 4 (four) hours as needed. For shortness of breath    Historical Provider, MD  aspirin 325 MG tablet Take 162.5 mg by mouth daily.     Historical Provider, MD  budesonide-formoterol (SYMBICORT) 160-4.5 MCG/ACT inhaler Inhale 1 puff into the lungs 2 (two) times daily. 02/26/13   Chesley Mires, MD  donepezil (ARICEPT) 10 MG tablet Take 1 tablet (10 mg total) by mouth at bedtime. 04/26/14   Penni Bombard, MD  folic acid (FOLVITE) 1 MG tablet Take 1 mg by mouth daily.    Historical Provider, MD  hydrochlorothiazide (HYDRODIURIL) 25 MG tablet Take 12.5 mg by mouth daily.    Historical Provider, MD  ipratropium-albuterol (DUONEB) 0.5-2.5 (3) MG/3ML SOLN Take 3 mLs by nebulization every 6 (six) hours as needed. For shortness of breath    Historical Provider, MD  losartan (COZAAR) 50 MG tablet Take 1 tablet (50 mg total) by mouth daily. 11/26/12   [name], MD  MAGNESIUM SULFATE PO Take 1 tablet by mouth daily.    Historical Provider, MD  Memantine HCl ER (NAMENDA XR) 28 MG CP24 Take 28 mg by mouth daily.     Historical Provider, MD  metoprolol (LOPRESSOR) 50 MG tablet TAKE 1 TABLET TWICE A DAY    [name], MD  Multiple Vitamins-Minerals (CENTRUM SILVER PO) Take 1 tablet by mouth every morning.     Historical Provider, MD  Omega-3 Fatty Acids (FISH OIL PO) Take 2 capsules by mouth daily.    Historical Provider, MD  omeprazole (PRILOSEC OTC) 20 MG tablet Take 20 mg by mouth 2 (two) times daily.    Historical Provider, MD  Polyethyl Glycol-Propyl Glycol (SYSTANE) 0.4-0.3 % SOLN Apply 1  drop to eye daily as needed (for redness).    Historical Provider, MD  polyethylene glycol (MIRALAX / GLYCOLAX) packet Take 17 g by mouth daily.    Historical Provider, MD  simvastatin (ZOCOR) 20 MG tablet Take 20 mg by mouth every evening.     Historical Provider, MD  tiotropium (SPIRIVA) 18 MCG inhalation capsule Place 18 mcg into inhaler and inhale daily.    Historical Provider, MD   Physical Exam: Filed Vitals:   05/28/14 2034  BP:   Pulse:   Temp: 98.3 F (36.8 C)  Resp:     BP 111/61  Pulse 91  Temp(Src) 98.3 F (36.8 C) (Oral)  Resp 24  Ht 5\' 8"  (1.727 m)  Wt 93.6 kg (206 lb 5.6 oz)  BMI 31.38 kg/m2  SpO2 98%  General Appearance:    Alert, oriented, no distress, appears stated age  Head:    Normocephalic, atraumatic  Eyes:    PERRL, EOMI, sclera non-icteric        Nose:   Nares without drainage or epistaxis. Mucosa, turbinates normal  Throat:   Moist mucous membranes. Oropharynx without erythema or exudate.  Neck:   Supple. No carotid bruits.  No thyromegaly.  No lymphadenopathy.   Back:     No CVA tenderness, no spinal tenderness  Lungs:     Grunting on exhalation is typical per wife.  Chest wall:    No tenderness to palpitation  Heart:    Regular rate and rhythm without murmurs, gallops, rubs  Abdomen:     Soft, non-tender, nondistended, normal bowel sounds, no organomegaly  Genitalia:    deferred  Rectal:    deferred  Extremities:   BLE Edema 1+  Pulses:   2+ and symmetric all extremities  Skin:   Skin color, texture, turgor normal, no rashes or lesions  Lymph nodes:   Cervical, supraclavicular, and axillary nodes normal  Neurologic:   CNII-XII intact. Normal strength, sensation and reflexes      throughout    Labs on Admission:  Basic Metabolic Panel:  Recent Labs Lab 05/28/14 1400  NA 138  K 4.3  CL 97  CO2 22  GLUCOSE 326*  BUN 13  CREATININE 1.10  CALCIUM 9.5   Liver Function Tests:  Recent Labs Lab 05/28/14 1400  AST 580*  ALT 179*   ALKPHOS 361*  BILITOT 1.9*  PROT 6.7  ALBUMIN 3.0*    Recent Labs Lab 05/28/14 1504  LIPASE 27   No results found for this basename: AMMONIA,  in the last 168 hours CBC:  Recent Labs Lab 05/28/14 1400  WBC 11.7*  NEUTROABS 11.0*  HGB 13.8  HCT 41.8  MCV 93.9  PLT 163   Cardiac Enzymes:  Recent Labs Lab  05/28/14 1400  TROPONINI <0.30    BNP (last 3 results)  Recent Labs  05/28/14 1400  PROBNP 434.4   CBG:  Recent Labs Lab 05/28/14 1929 05/28/14 2029  GLUCAP 221* 237*    Radiological Exams on Admission: Ct Abdomen Pelvis W Contrast  05/28/2014   CLINICAL DATA:  Shortness of breath and upper abdominal pain after mowing lawn earlier this morning.  EXAM: CT ABDOMEN AND PELVIS WITH CONTRAST  TECHNIQUE: Multidetector CT imaging of the abdomen and pelvis was performed using the standard protocol following bolus administration of intravenous contrast.  CONTRAST:  97mL OMNIPAQUE IOHEXOL 300 MG/ML SOLN, 187mL OMNIPAQUE IOHEXOL 300 MG/ML SOLN  COMPARISON:  CT the abdomen pelvis - 04/19/2011; CT of the chest, abdomen pelvis - 08/26/2009; fluoroscopic guided biliary stent placement - 05/25/2011  FINDINGS: Normal hepatic contour. No discrete hepatic lesions. There has been removal of previously placed percutaneous biliary stent. Pneumobilia is seen within the persistently dilated common bile duct as well as the nondependent portions of the biliary tree of the left lobe of the liver. Again, the common bile duct appears enlarged measuring approximately 1.7 cm and maximal coronal dimension (image 51, series 5), similar to the 2010 examination. Post cholecystectomy. No ascites.  There is symmetric enhancement and excretion of the bilateral kidneys. Bilateral sub cm hypo attenuating renal lesions too small to actually characterize of favored to represent renal cysts. No definite renal stones this postcontrast examination. There is a minimal amount of grossly symmetric age and body  habitus related perinephric stranding. No urinary obstruction. Normal appearance of the bilateral adrenal glands, pancreas and spleen.  Ingested enteric contrast extends to the level of the mid/distal small bowel. No evidence of enteric obstruction. Normal appearance of the appendix. Moderate colonic stool burden without evidence of obstruction. Scattered minimal colonic diverticulosis without evidence of diverticulitis. No pneumoperitoneum, pneumatosis or portal venous gas.  Scattered mixed calcified and noncalcified atherosclerotic plaque throughout the abdominal aorta. There is a minimal amounts of bilobed ectasia involving the infrarenal abdominal aorta with dominant cranial component measuring approximately 2.9 cm in maximal coronal diameter (coronal image 58, series 5), grossly unchanged since the 2010 examination. The major branch vessels of the abdominal aorta appear patent on this non CTA examination.  No retroperitoneal, mesenteric, pelvic or inguinal lymphadenopathy.  Prostatic calcifications. Normal appearance of the urinary bladder given degree distention. No free fluid within the pelvis.  Limited visualization of the lower thorax demonstrates an approximately 4 mm nodule within the right lower lobe (image 4, series 4) which is unchanged since the 2010 examination and thus of benign etiology. There is minimal subsegmental atelectasis within the imaged caudal aspects of the right middle lobe and lingula. There is minimal dependent subsegmental atelectasis within the bilateral lower lobes. No discrete focal airspace opacities. No pleural effusion.  Cardiomegaly. Unchanged trace amount of pericardial fluid. Pacemaker leads are seen within the right atrium and ventricle. Coronary artery calcifications. Calcifications with the aortic valve leaflets are suspected though incompletely evaluated.  No acute or aggressive osseus abnormalities. Moderate multilevel lumbar spine DDD, worse at L3-L4 and L4-L5 with  disc space height loss, endplate irregularity and sclerosis. Mild degenerative change of the bilateral hips, right greater than left.  Moderate-sized left-sided mixed direct and indirect mesenteric fat containing inguinal hernia. Regional soft tissues appear otherwise normal.  IMPRESSION: 1. No definite explanation for patient's upper abdominal pain and shortness of breath. 2. Post removal of percutaneous biliary stents with persistent dilatation of the common bile duct measuring  approximately 1.7 cm in diameter, similar to prior examinations. Expected pneumobilia post biliary sphincterotomy. 3. Minimal colonic diverticulosis without evidence of diverticulitis. 4. Coronary artery calcifications. 5. Cardiomegaly. Small amount of pericardial fluid, unchanged since the 2010 examination and less presumably physiologic in etiology. 6. Unchanged ectasia of the infrarenal abdominal aorta measuring approximately 2.9 cm in diameter, stable since the 2010 examination. Recommend follow up by [place] in 5 years. This recommendation follows ACR consensus guidelines: White Paper of the ACR Incidental Findings Committee II on Vascular Findings. J Am Coll Radiol 2013; 10:789-794.   Electronically Signed   By: Sandi Mariscal M.D.   On: 05/28/2014 16:12   Dg Chest Portable 1 View  05/28/2014   CLINICAL DATA:  Shortness of breath and left-sided chest pain following exertion; history of coronary artery disease and diabetes  EXAM: PORTABLE CHEST - 1 VIEW  COMPARISON:  Portable chest x-ray dated October 29, 2012  FINDINGS: The lungs are well-expanded. The interstitial markings are coarse. The cardiopericardial silhouette is enlarged. The pulmonary vascularity is mildly prominent. The permanent pacemaker is unchanged in position. The bony thorax exhibits no acute abnormalities.  IMPRESSION: COPD with low-grade CHF. There is no focal pneumonia nor alveolar edema.   Electronically Signed   By: David  Martinique   On: 05/28/2014 14:16    EKG:  Independently reviewed.  Assessment/Plan Principal Problem:   Sepsis Active Problems:   DM2 (diabetes mellitus, type 2)   Hypertension   Systolic CHF, chronic   Dementia with behavioral disturbance   1. Sepsis 1. Unclear source, blood and urine cultures pending, have ordered RUQ ultrasound given elevated LFTs and gallstone history, repeat CMP in AM. 2. Gentle hydration, got 2L IVF in ED, watch for fluid overload given chronic systolic CHF history. 3. Hold HTN meds for now 4. Aztreonam, vanc, and levaquin 5. Tylenol prn fever 2. DM2 - 1. dosent appear to be on home meds from med list 2. Putting on SSI med dose with bedtime coverage for tonight.    Code Status: DNR Family Communication: No family in room Disposition Plan: Admit to inpatient   Time spent: 63 min  GARDNER, JARED M. [facility] Pager [phone]  If 7AM-7PM, please contact the day team taking care of the patient [email or web address] Password [facility] 05/28/2014, 9:23 PM

## 2014-05-29 ENCOUNTER — Inpatient Hospital Stay (HOSPITAL_COMMUNITY): Payer: Medicare Other

## 2014-05-29 DIAGNOSIS — J438 Other emphysema: Secondary | ICD-10-CM

## 2014-05-29 LAB — GLUCOSE, CAPILLARY
GLUCOSE-CAPILLARY: 166 mg/dL — AB (ref 70–99)
Glucose-Capillary: 159 mg/dL — ABNORMAL HIGH (ref 70–99)
Glucose-Capillary: 130 mg/dL — ABNORMAL HIGH (ref 70–99)
Glucose-Capillary: 185 mg/dL — ABNORMAL HIGH (ref 70–99)

## 2014-05-29 LAB — COMPREHENSIVE METABOLIC PANEL
ALT: 239 U/L — AB (ref 0–53)
AST: 476 U/L — ABNORMAL HIGH (ref 0–37)
Albumin: 2.3 g/dL — ABNORMAL LOW (ref 3.5–5.2)
Alkaline Phosphatase: 258 U/L — ABNORMAL HIGH (ref 39–117)
Anion gap: 11 (ref 5–15)
BUN: 15 mg/dL (ref 6–23)
CO2: 27 meq/L (ref 19–32)
Calcium: 8.4 mg/dL (ref 8.4–10.5)
Chloride: 101 mEq/L (ref 96–112)
Creatinine, Ser: 1.28 mg/dL (ref 0.50–1.35)
GFR calc non Af Amer: 50 mL/min — ABNORMAL LOW (ref >90)
GFR, EST AFRICAN AMERICAN: 58 mL/min — AB (ref >90)
GLUCOSE: 166 mg/dL — AB (ref 70–99)
POTASSIUM: 4.3 meq/L (ref 3.7–5.3)
Sodium: 139 mEq/L (ref 137–147)
TOTAL PROTEIN: 5.2 g/dL — AB (ref 6.0–8.3)
Total Bilirubin: 2.6 mg/dL — ABNORMAL HIGH (ref 0.3–1.2)

## 2014-05-29 LAB — CBC
HCT: 35 % — ABNORMAL LOW (ref 39.0–52.0)
HEMOGLOBIN: 11.5 g/dL — AB (ref 13.0–17.0)
MCH: 31.3 pg (ref 26.0–34.0)
MCHC: 32.9 g/dL (ref 30.0–36.0)
MCV: 95.4 fL (ref 78.0–100.0)
Platelets: 162 10*3/uL (ref 150–400)
RBC: 3.67 MIL/uL — AB (ref 4.22–5.81)
RDW: 13.6 % (ref 11.5–15.5)
WBC: 14.5 10*3/uL — ABNORMAL HIGH (ref 4.0–10.5)

## 2014-05-29 LAB — URINE CULTURE
Colony Count: NO GROWTH
Culture: NO GROWTH

## 2014-05-29 MED ORDER — LEVOFLOXACIN IN D5W 750 MG/150ML IV SOLN
750.0000 mg | INTRAVENOUS | Status: DC
  Administered 2014-05-30: 750 mg via INTRAVENOUS
  Filled 2014-05-29: qty 150

## 2014-05-29 MED ORDER — ASPIRIN 81 MG PO CHEW
162.0000 mg | CHEWABLE_TABLET | Freq: Every day | ORAL | Status: DC
  Administered 2014-05-29 – 2014-06-05 (×8): 162 mg via ORAL
  Filled 2014-05-29 (×8): qty 2

## 2014-05-29 MED ORDER — CETYLPYRIDINIUM CHLORIDE 0.05 % MT LIQD: 7.0000 mL | Freq: Two times a day (BID) | OROMUCOSAL | Status: DC

## 2014-05-29 MED ORDER — CETYLPYRIDINIUM CHLORIDE 0.05 % MT LIQD
7.0000 mL | Freq: Two times a day (BID) | OROMUCOSAL | Status: DC
  Administered 2014-05-29 – 2014-06-05 (×12): 7 mL via OROMUCOSAL

## 2014-05-29 NOTE — Consult Note (Signed)
Cross cover [facility] Reason for Consult: Biliary sepsis. Referring Physician: THP  [name] is an 80 y.o. male.  HPI: 80 year old, white male with multiple medical issues listed below, was brought to the ER by his wife as he felt weak and had shaking chills. He was found to be febrile with leukocytosis and abnormal LFT's, consistent with biliary sepsis. He has a very complicated history from a GI standpoint. He has had several bouts of recurrent pancreatitis and has had a pancreatic stent placed in the past. He has also received care at [facility] as well. Upon receiving antibiotics he has become afebrile. He is unable to give much history as he is [facility] and has dementia. He denies having any abdominal pain, nausea or vomiting. He has had 3 failed  ERCP's in the past. I am not sure when his stent was removed but there was no stent noted on his recent CT scan done on admission. He has pneumobilia as expect with a 17 mm CBD.   Past Medical History  Diagnosis Date  .    Marland Kitchen Coronary artery disease   . Diabetes mellitus   . Hypertension   . Hyperlipidemia   . Obesities, morbid   . Gallstone pancreatitis     recurrent  . TIA (transient ischemic attack)   . Bacteremia due to vancomycin resistant Enterococcus   . GERD (gastroesophageal reflux disease)   . Hearing impairment   . Myocardial infarction, anterior wall, subsequent care   . Second degree AV block, Mobitz type II     s/p PPM by JA 4/12  . ARF (acute renal failure)   . Cholangitis   . Duodenal ulcer   . GI bleed   . Anemia associated with acute blood loss   . 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 Dementia in Alzheimer's disease with delirium    Past Surgical History  Procedure Laterality Date  . Cholecystectomy    . Pacemaker placement  01/01/11    implanted by JA for mobitz II AV block  . Pancreatic stent     Family History  Problem Relation Age of Onset  . Hypertension Other    Social  History:  reports that he quit smoking about 18 years ago. His smoking use included Cigarettes. He has a 57 pack-year smoking history. He has never used smokeless tobacco. He reports that he does not drink alcohol or use illicit drugs.  Allergies:  Allergies  Allergen Reactions  . Doxycycline Other (See Comments)    Unknown- patient doesn't remember  . Penicillins Rash   Medications: I have reviewed the patient's current medications.  Results for orders placed during the hospital encounter of 05/28/14 (from the past 48 hour(s))  TROPONIN I     Status: None   Collection Time    05/28/14  2:00 PM      Result Value Ref Range   Troponin I <0.30  <0.30 ng/mL   Comment:            Due to the release kinetics of cTnI,     a negative result within the first hours     of the onset of symptoms does not rule out     myocardial infarction with certainty.     If myocardial infarction is still suspected,     repeat the test at appropriate intervals.  CBC WITH DIFFERENTIAL     Status: Abnormal   Collection Time    05/28/14  2:00 PM      Result Value Ref Range   WBC 11.7 (*) 4.0 - 10.5 K/uL   RBC 4.45  4.22 - 5.81 MIL/uL   Hemoglobin 13.8  13.0 - 17.0 g/dL   HCT 41.8  39.0 - 52.0 %   MCV 93.9  78.0 - 100.0 fL   MCH 31.0  26.0 - 34.0 pg   MCHC 33.0  30.0 - 36.0 g/dL   RDW 13.2  11.5 - 15.5 %   Platelets 163  150 - 400 K/uL   Neutrophils Relative % 94 (*) 43 - 77 %   Neutro Abs 11.0 (*) 1.7 - 7.7 K/uL   Lymphocytes Relative 3 (*) 12 - 46 %   Lymphs Abs 0.4 (*) 0.7 - 4.0 K/uL   Monocytes Relative 3  3 - 12 %   Monocytes Absolute 0.4  0.1 - 1.0 K/uL   Eosinophils Relative 0  0 - 5 %   Eosinophils Absolute 0.0  0.0 - 0.7 K/uL   Basophils Relative 0  0 - 1 %   Basophils Absolute 0.0  0.0 - 0.1 K/uL  COMPREHENSIVE METABOLIC PANEL     Status: Abnormal   Collection Time    05/28/14  2:00 PM      Result Value Ref Range   Sodium 138  137 - 147 mEq/L   Potassium 4.3  3.7 - 5.3 mEq/L    Chloride 97  96 - 112 mEq/L   CO2 22  19 - 32 mEq/L   Glucose, Bld 326 (*) 70 - 99 mg/dL   BUN 13  6 - 23 mg/dL   Creatinine, Ser 1.10  0.50 - 1.35 mg/dL   Calcium 9.5  8.4 - 10.5 mg/dL   Total Protein 6.7  6.0 - 8.3 g/dL   Albumin 3.0 (*) 3.5 - 5.2 g/dL   AST 580 (*) 0 - 37 U/L   Comment: SLIGHT HEMOLYSIS   ALT 179 (*) 0 - 53 U/L   Alkaline Phosphatase 361 (*) 39 - 117 U/L   Total Bilirubin 1.9 (*) 0.3 - 1.2 mg/dL   GFR calc non Af Amer 60 (*) >90 mL/min   GFR calc Af Amer 69 (*) >90 mL/min   Comment: (NOTE)     The eGFR has been calculated using the CKD EPI equation.     This calculation has not been validated in all clinical situations.     eGFR's persistently <90 mL/min signify possible Chronic Kidney     Disease.   Anion gap 19 (*) 5 - 15  PRO B NATRIURETIC PEPTIDE     Status: None   Collection Time    05/28/14  2:00 PM      Result Value Ref Range   Pro B Natriuretic peptide (BNP) 434.4  0 - 450 pg/mL  I-STAT CG4 LACTIC ACID, ED     Status: Abnormal   Collection Time    05/28/14  2:05 PM      Result Value Ref Range   Lactic Acid, Venous 3.80 (*) 0.5 - 2.2 mmol/L  I-STAT ARTERIAL BLOOD GAS, ED     Status: None   Collection Time    05/28/14  2:18 PM      Result Value Ref Range   pH, Arterial 7.422  7.350 - 7.450   pCO2 arterial 37.7  35.0 - 45.0 mmHg   pO2, Arterial 88.0  80.0 - 100.0 mmHg   Bicarbonate 24.0  20.0 - 24.0 mEq/L   TCO2 25  0 - 100 mmol/L   O2 Saturation 96.0     Acid-Base Excess 1.0  0.0 - 2.0 mmol/L   Patient temperature 103.4 F     Sample type ARTERIAL    URINALYSIS, ROUTINE W REFLEX MICROSCOPIC     Status: Abnormal   Collection Time    05/28/14  2:45 PM      Result Value Ref Range   Color, Urine YELLOW  YELLOW   APPearance CLEAR  CLEAR   Specific Gravity, Urine 1.013  1.005 - 1.030   pH 7.0  5.0 - 8.0   Glucose, UA 500 (*) NEGATIVE mg/dL   Hgb urine dipstick NEGATIVE  NEGATIVE   Bilirubin Urine SMALL (*) NEGATIVE   Ketones, ur NEGATIVE   NEGATIVE mg/dL   Protein, ur NEGATIVE  NEGATIVE mg/dL   Urobilinogen, UA 4.0 (*) 0.0 - 1.0 mg/dL   Nitrite NEGATIVE  NEGATIVE   Leukocytes, UA NEGATIVE  NEGATIVE   Comment: MICROSCOPIC NOT DONE ON URINES WITH NEGATIVE PROTEIN, BLOOD, LEUKOCYTES, NITRITE, OR GLUCOSE <1000 mg/dL.  LIPASE, BLOOD     Status: None   Collection Time    05/28/14  3:04 PM      Result Value Ref Range   Lipase 27  11 - 59 U/L  CBG MONITORING, ED     Status: Abnormal   Collection Time    05/28/14  7:29 PM      Result Value Ref Range   Glucose-Capillary 221 (*) 70 - 99 mg/dL  GLUCOSE, CAPILLARY     Status: Abnormal   Collection Time    05/28/14  8:29 PM      Result Value Ref Range   Glucose-Capillary 237 (*) 70 - 99 mg/dL  MRSA PCR SCREENING     Status: None   Collection Time    05/28/14  8:42 PM      Result Value Ref Range   MRSA by PCR NEGATIVE  NEGATIVE   Comment:            The GeneXpert MRSA Assay (FDA     approved for NASAL specimens     only), is one component of a     comprehensive MRSA colonization     surveillance program. It is not     intended to diagnose MRSA     infection nor to guide or     monitor treatment for     MRSA infections.  GLUCOSE, CAPILLARY     Status: Abnormal   Collection Time    05/28/14 10:08 PM      Result Value Ref Range   Glucose-Capillary 209 (*) 70 - 99 mg/dL  CBC     Status: Abnormal   Collection Time    05/29/14  2:48 AM      Result Value Ref Range   WBC 14.5 (*) 4.0 - 10.5 K/uL   RBC 3.67 (*) 4.22 - 5.81 MIL/uL   Hemoglobin 11.5 (*) 13.0 - 17.0 g/dL   HCT 64.6 (*) 14.0 - 12.0 %   MCV 95.4  78.0 - 100.0 fL   MCH 31.3  26.0 - 34.0 pg   MCHC 32.9  30.0 - 36.0 g/dL   RDW 35.8  13.6 - 25.2 %   Platelets 162  150 - 400 K/uL  COMPREHENSIVE METABOLIC PANEL     Status: Abnormal   Collection Time    05/29/14  2:48 AM      Result Value Ref Range   Sodium 139  137 - 147  mEq/L   Potassium 4.3  3.7 - 5.3 mEq/L   Chloride 101  96 - 112 mEq/L   CO2 27  19 - 32  mEq/L   Glucose, Bld 166 (*) 70 - 99 mg/dL   BUN 15  6 - 23 mg/dL   Creatinine, Ser 1.28  0.50 - 1.35 mg/dL   Calcium 8.4  8.4 - 10.5 mg/dL   Total Protein 5.2 (*) 6.0 - 8.3 g/dL   Albumin 2.3 (*) 3.5 - 5.2 g/dL   AST 476 (*) 0 - 37 U/L   Comment: SLIGHT HEMOLYSIS   ALT 239 (*) 0 - 53 U/L   Alkaline Phosphatase 258 (*) 39 - 117 U/L   Total Bilirubin 2.6 (*) 0.3 - 1.2 mg/dL   GFR calc non Af Amer 50 (*) >90 mL/min   GFR calc Af Amer 58 (*) >90 mL/min   Comment: (NOTE)     The eGFR has been calculated using the CKD EPI equation.     This calculation has not been validated in all clinical situations.     eGFR's persistently <90 mL/min signify possible Chronic Kidney     Disease.   Anion gap 11  5 - 15  GLUCOSE, CAPILLARY     Status: Abnormal   Collection Time    05/29/14  8:25 AM      Result Value Ref Range   Glucose-Capillary 166 (*) 70 - 99 mg/dL   Comment 1 Documented in Chart     Comment 2 Notify RN     Ct Abdomen Pelvis W Contrast  05/28/2014   CLINICAL DATA:  Shortness of breath and upper abdominal pain after mowing lawn earlier this morning.  EXAM: CT ABDOMEN AND PELVIS WITH CONTRAST  TECHNIQUE: Multidetector CT imaging of the abdomen and pelvis was performed using the standard protocol following bolus administration of intravenous contrast.  CONTRAST:  84mL OMNIPAQUE IOHEXOL 300 MG/ML SOLN, 168mL OMNIPAQUE IOHEXOL 300 MG/ML SOLN  COMPARISON:  CT the abdomen pelvis - 04/19/2011; CT of the chest, abdomen pelvis - 08/26/2009; fluoroscopic guided biliary stent placement - 05/25/2011  FINDINGS: Normal hepatic contour. No discrete hepatic lesions. There has been removal of previously placed percutaneous biliary stent. Pneumobilia is seen within the persistently dilated common bile duct as well as the nondependent portions of the biliary tree of the left lobe of the liver. Again, the common bile duct appears enlarged measuring approximately 1.7 cm and maximal coronal dimension (image 51,  series 5), similar to the 2010 examination. Post cholecystectomy. No ascites.  There is symmetric enhancement and excretion of the bilateral kidneys. Bilateral sub cm hypo attenuating renal lesions too small to actually characterize of favored to represent renal cysts. No definite renal stones this postcontrast examination. There is a minimal amount of grossly symmetric age and body habitus related perinephric stranding. No urinary obstruction. Normal appearance of the bilateral adrenal glands, pancreas and spleen.  Ingested enteric contrast extends to the level of the mid/distal small bowel. No evidence of enteric obstruction. Normal appearance of the appendix. Moderate colonic stool burden without evidence of obstruction. Scattered minimal colonic diverticulosis without evidence of diverticulitis. No pneumoperitoneum, pneumatosis or portal venous gas.  Scattered mixed calcified and noncalcified atherosclerotic plaque throughout the abdominal aorta. There is a minimal amounts of bilobed ectasia involving the infrarenal abdominal aorta with dominant cranial component measuring approximately 2.9 cm in maximal coronal diameter (coronal image 58, series 5), grossly unchanged since the 2010 examination. The major branch vessels of the abdominal aorta  appear patent on this non CTA examination.  No retroperitoneal, mesenteric, pelvic or inguinal lymphadenopathy.  Prostatic calcifications. Normal appearance of the urinary bladder given degree distention. No free fluid within the pelvis.  Limited visualization of the lower thorax demonstrates an approximately 4 mm nodule within the right lower lobe (image 4, series 4) which is unchanged since the 2010 examination and thus of benign etiology. There is minimal subsegmental atelectasis within the imaged caudal aspects of the right middle lobe and lingula. There is minimal dependent subsegmental atelectasis within the bilateral lower lobes. No discrete focal airspace opacities.  No pleural effusion.  Cardiomegaly. Unchanged trace amount of pericardial fluid. Pacemaker leads are seen within the right atrium and ventricle. Coronary artery calcifications. Calcifications with the aortic valve leaflets are suspected though incompletely evaluated.  No acute or aggressive osseus abnormalities. Moderate multilevel lumbar spine DDD, worse at L3-L4 and L4-L5 with disc space height loss, endplate irregularity and sclerosis. Mild degenerative change of the bilateral hips, right greater than left.  Moderate-sized left-sided mixed direct and indirect mesenteric fat containing inguinal hernia. Regional soft tissues appear otherwise normal.  IMPRESSION: 1. No definite explanation for patient's upper abdominal pain and shortness of breath. 2. Post removal of percutaneous biliary stents with persistent dilatation of the common bile duct measuring approximately 1.7 cm in diameter, similar to prior examinations. Expected pneumobilia post biliary sphincterotomy. 3. Minimal colonic diverticulosis without evidence of diverticulitis. 4. Coronary artery calcifications. 5. Cardiomegaly. Small amount of pericardial fluid, unchanged since the 2010 examination and less presumably physiologic in etiology. 6. Unchanged ectasia of the infrarenal abdominal aorta measuring approximately 2.9 cm in diameter, stable since the 2010 examination. Recommend follow up by [place] in 5 years. This recommendation follows ACR consensus guidelines: White Paper of the ACR Incidental Findings Committee II on Vascular Findings. J Am Coll Radiol 2013; 10:789-794.   Electronically Signed   By: Simonne Come M.D.   On: 05/28/2014 16:12   Dg Chest Portable 1 View  05/28/2014   CLINICAL DATA:  Shortness of breath and left-sided chest pain following exertion; history of coronary artery disease and diabetes  EXAM: PORTABLE CHEST - 1 VIEW  COMPARISON:  Portable chest x-ray dated October 29, 2012  FINDINGS: The lungs are well-expanded. The interstitial  markings are coarse. The cardiopericardial silhouette is enlarged. The pulmonary vascularity is mildly prominent. The permanent pacemaker is unchanged in position. The bony thorax exhibits no acute abnormalities.  IMPRESSION: COPD with low-grade CHF. There is no focal pneumonia nor alveolar edema.   Electronically Signed   By: David  [place]   On: 05/28/2014 14:16   US Abdomen Limited Ruq  05/29/2014   CLINICAL DATA:  Abdominal pain/ vomiting, status post cholecystectomy  EXAM: US ABDOMEN LIMITED - RIGHT UPPER QUADRANT  COMPARISON:  CT abdomen pelvis dated 05/28/2014  FINDINGS: Gallbladder:  Surgically absent.  Common bile duct:  Diameter: 14 mm.  Liver:  Coarse hepatic echotexture with increased parenchymal echogenicity, likely reflecting hepatic steatosis. Pneumobilia.  IMPRESSION: Status post cholecystectomy.  Common duct measures 14 mm, unchanged from CT.  Pneumobilia.  Suspected hepatic steatosis.   Electronically Signed   By: Charline Bills M.D.   On: 05/29/2014 10:04   Review of Systems  Constitutional: Positive for fever, chills and malaise/fatigue. Negative for weight loss and diaphoresis.  HENT: Negative.   Respiratory: Positive for shortness of breath.   Gastrointestinal: Positive for abdominal pain.  Musculoskeletal: Positive for joint pain.  Neurological: Positive for weakness.  Psychiatric/Behavioral: The patient is  nervous/anxious.    Blood pressure 123/70, pulse 86, temperature 98.2 F (36.8 C), temperature source Oral, resp. rate 20, height $RemoveBe'5\' 8"'[id number]$  (1.727 m), weight 93.6 kg (206 lb 5.6 oz), SpO2 98.00%. Physical Exam  Constitutional: He appears well-developed and well-nourished. He appears lethargic.  HENT:  Head: Normocephalic.  Eyes: Conjunctivae and EOM are normal. Pupils are equal, round, and reactive to light.  Neck: Normal range of motion. Neck supple.  Cardiovascular: Normal rate and regular rhythm.   Respiratory: Breath sounds normal.  GI: Bowel sounds are normal. He  exhibits no distension and no mass. There is no tenderness. There is no rebound and no guarding.  Neurological: He appears lethargic. He is disoriented.  Skin: Skin is warm and dry.   Assessment/Plan: 1) Biliary sepsis with fever, abdominal pain and abnormal LFT's. I have reviewed his chart extensively and discussed this with Dr. Lucio Edward and Dr. Wallene Dales in IR. The patient has had 3 failed ERCP's and has had pancreatic stenting in the past for recurrent pancreatitis. There is no stent present at this time. His CBD is dialated with no change from the last time he was imaged. As per my discussion with Dr. Wallene Dales, this will a very difficult procedure both from an ERCP standpoint and from an IR standpoint. The plans are therefore to continue antibiotics and monitor him closely. ERCP in AM.  2) GERD.  3) History of gallstone pancreatitis.  4) HTN/Hyperlipidemia/CHF/CAD/[employer] pacermaker . 5) AODM. 6) Chronic renal insufficiency.  Xzavion Doswell 05/29/2014, 11:26 AM

## 2014-05-29 NOTE — Progress Notes (Signed)
[facility] PROGRESS NOTE  [name] VOZ:[id number] DOB: 09-May-1927 DOA: 05/28/2014 PCP: Stephens Shire, MD  Assessment/Plan: 80 y/o male with PMH of HTN, DM, CAD, s/p PPM, COPD, dementia, h/o biliary stones s/p perc drainage and removal presents with several episodes of vomiting and some abdominal pain  1. Sepsis suspected Cholangitis, fever+abd pain, hyper bili/alk phosphatase/alt/ast; CT abd: persistent dilated CBD s/p  removal of percutaneous biliary stents post  biliary sphincterotomy -cont IV atx, IVF, cont antiemetics; consulted GI probable need ERCP vs Perc draining; pend abd [place]; keep NPO;   2.  COPD, no wheezing on exam, CXR: no clear pneumonia;  -cont bronchodilators as needed  3. HTN, hold diuretic whil eon IVF, sepsis; BOP is soft  4. CAD history; no active chest pain; cont monitoring  5. H/O DM not on meds; cont ISS:    Code Status: DNR Family Communication: d/w patient, called d/w Meiners,Lois Spouse [phone]  (indicate person spoken with, relationship, and if by phone, the number) Disposition Plan: pend clinical improvement    Consultants:  GI  Procedures:  None   Antibiotics:  Aztreonam 9/5<<<<  vanc 9/5<<<<  levoflox 9/5<<<<   (indicate start date, and stop date if known)  HPI/Subjective: Alert, but confused at baseline   Objective: Filed Vitals:   05/29/14 0832  BP:   Pulse:   Temp: 98.2 F (36.8 C)  Resp:     Intake/Output Summary (Last 24 hours) at 05/29/14 0852 Last data filed at 05/29/14 0600  Gross per 24 hour  Intake 844.17 ml  Output    580 ml  Net 264.17 ml   Filed Weights   05/28/14 1649 05/28/14 2033  Weight: 89.16 kg (196 lb 9 oz) 93.6 kg (206 lb 5.6 oz)    Exam:   General:  Alert, but confused at baseline   Cardiovascular: s1,s2 rrr  Respiratory: few crackles in LL  Abdomen: soft, distended, mild RUQ tender  Musculoskeletal: no LE edema   Data Reviewed: Basic Metabolic Panel:  Recent  Labs Lab 05/28/14 1400 05/29/14 0248  NA 138 139  K 4.3 4.3  CL 97 101  CO2 22 27  GLUCOSE 326* 166*  BUN 13 15  CREATININE 1.10 1.28  CALCIUM 9.5 8.4   Liver Function Tests:  Recent Labs Lab 05/28/14 1400 05/29/14 0248  AST 580* 476*  ALT 179* 239*  ALKPHOS 361* 258*  BILITOT 1.9* 2.6*  PROT 6.7 5.2*  ALBUMIN 3.0* 2.3*    Recent Labs Lab 05/28/14 1504  LIPASE 27   No results found for this basename: AMMONIA,  in the last 168 hours CBC:  Recent Labs Lab 05/28/14 1400 05/29/14 0248  WBC 11.7* 14.5*  NEUTROABS 11.0*  --   HGB 13.8 11.5*  HCT 41.8 35.0*  MCV 93.9 95.4  PLT 163 162   Cardiac Enzymes:  Recent Labs Lab 05/28/14 1400  TROPONINI <0.30   BNP (last 3 results)  Recent Labs  05/28/14 1400  PROBNP 434.4   CBG:  Recent Labs Lab 05/28/14 1929 05/28/14 2029 05/28/14 2208 05/29/14 0825  GLUCAP 221* 237* 209* 166*    Recent Results (from the past 240 hour(s))  MRSA PCR SCREENING     Status: None   Collection Time    05/28/14  8:42 PM      Result Value Ref Range Status   MRSA by PCR NEGATIVE  NEGATIVE Final   Comment:            The GeneXpert MRSA Assay (FDA  approved for NASAL specimens     only), is one component of a     comprehensive MRSA colonization     surveillance program. It is not     intended to diagnose MRSA     infection nor to guide or     monitor treatment for     MRSA infections.     Studies: Ct Abdomen Pelvis W Contrast  05/28/2014   CLINICAL DATA:  Shortness of breath and upper abdominal pain after mowing lawn earlier this morning.  EXAM: CT ABDOMEN AND PELVIS WITH CONTRAST  TECHNIQUE: Multidetector CT imaging of the abdomen and pelvis was performed using the standard protocol following bolus administration of intravenous contrast.  CONTRAST:  62m OMNIPAQUE IOHEXOL 300 MG/ML SOLN, 1052mOMNIPAQUE IOHEXOL 300 MG/ML SOLN  COMPARISON:  CT the abdomen pelvis - 04/19/2011; CT of the chest, abdomen pelvis -  08/26/2009; fluoroscopic guided biliary stent placement - 05/25/2011  FINDINGS: Normal hepatic contour. No discrete hepatic lesions. There has been removal of previously placed percutaneous biliary stent. Pneumobilia is seen within the persistently dilated common bile duct as well as the nondependent portions of the biliary tree of the left lobe of the liver. Again, the common bile duct appears enlarged measuring approximately 1.7 cm and maximal coronal dimension (image 51, series 5), similar to the 2010 examination. Post cholecystectomy. No ascites.  There is symmetric enhancement and excretion of the bilateral kidneys. Bilateral sub cm hypo attenuating renal lesions too small to actually characterize of favored to represent renal cysts. No definite renal stones this postcontrast examination. There is a minimal amount of grossly symmetric age and body habitus related perinephric stranding. No urinary obstruction. Normal appearance of the bilateral adrenal glands, pancreas and spleen.  Ingested enteric contrast extends to the level of the mid/distal small bowel. No evidence of enteric obstruction. Normal appearance of the appendix. Moderate colonic stool burden without evidence of obstruction. Scattered minimal colonic diverticulosis without evidence of diverticulitis. No pneumoperitoneum, pneumatosis or portal venous gas.  Scattered mixed calcified and noncalcified atherosclerotic plaque throughout the abdominal aorta. There is a minimal amounts of bilobed ectasia involving the infrarenal abdominal aorta with dominant cranial component measuring approximately 2.9 cm in maximal coronal diameter (coronal image 58, series 5), grossly unchanged since the 2010 examination. The major branch vessels of the abdominal aorta appear patent on this non CTA examination.  No retroperitoneal, mesenteric, pelvic or inguinal lymphadenopathy.  Prostatic calcifications. Normal appearance of the urinary bladder given degree  distention. No free fluid within the pelvis.  Limited visualization of the lower thorax demonstrates an approximately 4 mm nodule within the right lower lobe (image 4, series 4) which is unchanged since the 2010 examination and thus of benign etiology. There is minimal subsegmental atelectasis within the imaged caudal aspects of the right middle lobe and lingula. There is minimal dependent subsegmental atelectasis within the bilateral lower lobes. No discrete focal airspace opacities. No pleural effusion.  Cardiomegaly. Unchanged trace amount of pericardial fluid. Pacemaker leads are seen within the right atrium and ventricle. Coronary artery calcifications. Calcifications with the aortic valve leaflets are suspected though incompletely evaluated.  No acute or aggressive osseus abnormalities. Moderate multilevel lumbar spine DDD, worse at L3-L4 and L4-L5 with disc space height loss, endplate irregularity and sclerosis. Mild degenerative change of the bilateral hips, right greater than left.  Moderate-sized left-sided mixed direct and indirect mesenteric fat containing inguinal hernia. Regional soft tissues appear otherwise normal.  IMPRESSION: 1. No definite explanation for patient's upper  abdominal pain and shortness of breath. 2. Post removal of percutaneous biliary stents with persistent dilatation of the common bile duct measuring approximately 1.7 cm in diameter, similar to prior examinations. Expected pneumobilia post biliary sphincterotomy. 3. Minimal colonic diverticulosis without evidence of diverticulitis. 4. Coronary artery calcifications. 5. Cardiomegaly. Small amount of pericardial fluid, unchanged since the 2010 examination and less presumably physiologic in etiology. 6. Unchanged ectasia of the infrarenal abdominal aorta measuring approximately 2.9 cm in diameter, stable since the 2010 examination. Recommend follow up by [place] in 5 years. This recommendation follows ACR consensus guidelines: White Paper  of the ACR Incidental Findings Committee II on Vascular Findings. J Am Coll Radiol 2013; 10:789-794.   Electronically Signed   By: Sandi Mariscal M.D.   On: 05/28/2014 16:12   Dg Chest Portable 1 View  05/28/2014   CLINICAL DATA:  Shortness of breath and left-sided chest pain following exertion; history of coronary artery disease and diabetes  EXAM: PORTABLE CHEST - 1 VIEW  COMPARISON:  Portable chest x-ray dated October 29, 2012  FINDINGS: The lungs are well-expanded. The interstitial markings are coarse. The cardiopericardial silhouette is enlarged. The pulmonary vascularity is mildly prominent. The permanent pacemaker is unchanged in position. The bony thorax exhibits no acute abnormalities.  IMPRESSION: COPD with low-grade CHF. There is no focal pneumonia nor alveolar edema.   Electronically Signed   By: David  [place]   On: 05/28/2014 14:16    Scheduled Meds: . antiseptic oral rinse  7 mL Mouth Rinse BID  . aspirin  162 mg Oral Daily  . aztreonam  2 g Intravenous Q8H  . budesonide-formoterol  1 puff Inhalation BID  . donepezil  10 mg Oral QHS  . folic acid  1 mg Oral Daily  . heparin  5,000 Units Subcutaneous 3 times per day  . insulin aspart  0-15 Units Subcutaneous TID WC  . insulin aspart  0-5 Units Subcutaneous QHS  . [START ON 05/30/2014] levofloxacin (LEVAQUIN) IV  750 mg Intravenous Q48H  . Memantine HCl ER  28 mg Oral Daily  . pantoprazole  20 mg Oral BID  . polyethylene glycol  17 g Oral Daily  . simvastatin  20 mg Oral QPM  . sodium chloride  3 mL Intravenous Q12H  . tiotropium  18 mcg Inhalation Daily  . vancomycin  750 mg Intravenous Q12H   Continuous Infusions: . sodium chloride 50 mL/hr at 05/29/14 0038    Principal Problem:   Sepsis Active Problems:   DM2 (diabetes mellitus, type 2)   Hypertension   Systolic CHF, chronic   Dementia with behavioral disturbance    Time spent: >35 minutes     Kinnie Feil  [facility] Pager [phone]. If 7PM-7AM,  please contact night-coverage at [email or web address], password [facility] 05/29/2014, 8:52 AM  LOS: 1 day

## 2014-05-30 ENCOUNTER — Encounter (HOSPITAL_COMMUNITY): Payer: Self-pay | Admitting: Certified Registered"

## 2014-05-30 ENCOUNTER — Encounter (HOSPITAL_COMMUNITY): Payer: Medicare Other | Admitting: Certified Registered"

## 2014-05-30 ENCOUNTER — Inpatient Hospital Stay (HOSPITAL_COMMUNITY): Payer: Medicare Other

## 2014-05-30 ENCOUNTER — Encounter (HOSPITAL_COMMUNITY)
Admission: EM | Disposition: A | Discharge status: Home Health | Payer: Self-pay | Source: Home / Self Care | Attending: Internal Medicine

## 2014-05-30 ENCOUNTER — Inpatient Hospital Stay (HOSPITAL_COMMUNITY): Payer: Medicare Other | Admitting: Certified Registered"

## 2014-05-30 DIAGNOSIS — K8309 Other cholangitis: Secondary | ICD-10-CM

## 2014-05-30 DIAGNOSIS — R932 Abnormal findings on diagnostic imaging of liver and biliary tract: Secondary | ICD-10-CM

## 2014-05-30 DIAGNOSIS — A419 Sepsis, unspecified organism: Secondary | ICD-10-CM

## 2014-05-30 DIAGNOSIS — R945 Abnormal results of liver function studies: Secondary | ICD-10-CM

## 2014-05-30 HISTORY — PX: ERCP: SHX5425

## 2014-05-30 LAB — GLUCOSE, CAPILLARY
GLUCOSE-CAPILLARY: 177 mg/dL — AB (ref 70–99)
Glucose-Capillary: 135 mg/dL — ABNORMAL HIGH (ref 70–99)
Glucose-Capillary: 134 mg/dL — ABNORMAL HIGH (ref 70–99)

## 2014-05-30 LAB — EXPECTORATED SPUTUM ASSESSMENT W GRAM STAIN, RFLX TO RESP C

## 2014-05-30 LAB — COMPREHENSIVE METABOLIC PANEL
ALT: 163 U/L — ABNORMAL HIGH (ref 0–53)
ANION GAP: 11 (ref 5–15)
AST: 182 U/L — AB (ref 0–37)
Albumin: 2.3 g/dL — ABNORMAL LOW (ref 3.5–5.2)
Alkaline Phosphatase: 234 U/L — ABNORMAL HIGH (ref 39–117)
BUN: 14 mg/dL (ref 6–23)
CO2: 25 mEq/L (ref 19–32)
CREATININE: 1.15 mg/dL (ref 0.50–1.35)
Calcium: 8.4 mg/dL (ref 8.4–10.5)
Chloride: 101 mEq/L (ref 96–112)
GFR calc Af Amer: 66 mL/min — ABNORMAL LOW (ref >90)
GFR calc non Af Amer: 57 mL/min — ABNORMAL LOW (ref >90)
Glucose, Bld: 150 mg/dL — ABNORMAL HIGH (ref 70–99)
Potassium: 3.9 mEq/L (ref 3.7–5.3)
Sodium: 137 mEq/L (ref 137–147)
Total Bilirubin: 1.1 mg/dL (ref 0.3–1.2)
Total Protein: 5.4 g/dL — ABNORMAL LOW (ref 6.0–8.3)

## 2014-05-30 LAB — CBC
HEMATOCRIT: 36.3 % — AB (ref 39.0–52.0)
Hemoglobin: 11.9 g/dL — ABNORMAL LOW (ref 13.0–17.0)
MCH: 31.2 pg (ref 26.0–34.0)
MCHC: 32.8 g/dL (ref 30.0–36.0)
MCV: 95 fL (ref 78.0–100.0)
Platelets: 164 10*3/uL (ref 150–400)
RBC: 3.82 MIL/uL — AB (ref 4.22–5.81)
RDW: 13.9 % (ref 11.5–15.5)
WBC: 7.8 10*3/uL (ref 4.0–10.5)

## 2014-05-30 LAB — EXPECTORATED SPUTUM ASSESSMENT W REFEX TO RESP CULTURE

## 2014-05-30 SURGERY — ERCP, WITH INTERVENTION IF INDICATED
Anesthesia: General

## 2014-05-30 MED ORDER — SUCCINYLCHOLINE CHLORIDE 20 MG/ML IJ SOLN
INTRAMUSCULAR | Status: DC | PRN
  Administered 2014-05-30: 100 mg via INTRAVENOUS

## 2014-05-30 MED ORDER — OXYCODONE HCL 5 MG PO TABS
5.0000 mg | ORAL_TABLET | Freq: Once | ORAL | Status: AC | PRN
  Administered 2014-05-30: 5 mg via ORAL

## 2014-05-30 MED ORDER — GLUCAGON HCL RDNA (DIAGNOSTIC) 1 MG IJ SOLR
INTRAMUSCULAR | Status: AC
  Filled 2014-05-30: qty 1

## 2014-05-30 MED ORDER — METOPROLOL TARTRATE 1 MG/ML IV SOLN: 2.5000 mg | Freq: Four times a day (QID) | INTRAVENOUS | Status: DC | PRN

## 2014-05-30 MED ORDER — METOPROLOL TARTRATE 25 MG PO TABS
25.0000 mg | ORAL_TABLET | Freq: Two times a day (BID) | ORAL | Status: DC
  Administered 2014-05-30 (×2): 25 mg via ORAL
  Filled 2014-05-30 (×4): qty 1

## 2014-05-30 MED ORDER — OXYCODONE HCL 5 MG/5ML PO SOLN: 5.0000 mg | Freq: Once | ORAL | Status: AC | PRN

## 2014-05-30 MED ORDER — SODIUM CHLORIDE 0.9 % IV SOLN
10.0000 mg | INTRAVENOUS | Status: DC | PRN
  Administered 2014-05-30: 30 ug/min via INTRAVENOUS

## 2014-05-30 MED ORDER — SODIUM CHLORIDE 0.9 % IV SOLN
INTRAVENOUS | Status: DC | PRN
  Administered 2014-05-30: 12:00:00

## 2014-05-30 MED ORDER — LIDOCAINE HCL (CARDIAC) 20 MG/ML IV SOLN
INTRAVENOUS | Status: DC | PRN
  Administered 2014-05-30: 100 mg via INTRAVENOUS

## 2014-05-30 MED ORDER — FENTANYL CITRATE 0.05 MG/ML IJ SOLN
INTRAMUSCULAR | Status: DC | PRN
  Administered 2014-05-30: 150 ug via INTRAVENOUS

## 2014-05-30 MED ORDER — HYDROMORPHONE HCL PF 1 MG/ML IJ SOLN
0.2500 mg | INTRAMUSCULAR | Status: DC | PRN
  Administered 2014-05-30: 0.5 mg via INTRAVENOUS
  Filled 2014-05-30: qty 1

## 2014-05-30 MED ORDER — MEPERIDINE HCL 25 MG/ML IJ SOLN
6.2500 mg | INTRAMUSCULAR | Status: DC | PRN
Start: 2014-05-30 — End: 2014-05-31

## 2014-05-30 MED ORDER — LEVOFLOXACIN IN D5W 750 MG/150ML IV SOLN
750.0000 mg | INTRAVENOUS | Status: DC
  Filled 2014-05-30: qty 150

## 2014-05-30 MED ORDER — GLUCAGON HCL RDNA (DIAGNOSTIC) 1 MG IJ SOLR
INTRAMUSCULAR | Status: DC | PRN
  Administered 2014-05-30: .5 mg via INTRAVENOUS
  Administered 2014-05-30: 0.25 mg via INTRAVENOUS

## 2014-05-30 MED ORDER — PROPOFOL 10 MG/ML IV BOLUS
INTRAVENOUS | Status: DC | PRN
  Administered 2014-05-30: 100 mg via INTRAVENOUS

## 2014-05-30 MED ORDER — HYDRALAZINE HCL 20 MG/ML IJ SOLN: 10.0000 mg | Freq: Four times a day (QID) | INTRAMUSCULAR | Status: DC | PRN

## 2014-05-30 MED ORDER — ONDANSETRON HCL 4 MG/2ML IJ SOLN: 4.0000 mg | Freq: Once | INTRAMUSCULAR | Status: AC | PRN

## 2014-05-30 MED ORDER — OXYCODONE HCL 5 MG PO TABS
ORAL_TABLET | ORAL | Status: AC
  Administered 2014-05-30: 5 mg via ORAL
  Filled 2014-05-30: qty 1

## 2014-05-30 MED ORDER — PHENYLEPHRINE HCL 10 MG/ML IJ SOLN
INTRAMUSCULAR | Status: DC | PRN
  Administered 2014-05-30: 80 ug via INTRAVENOUS
  Administered 2014-05-30: 120 ug via INTRAVENOUS
  Administered 2014-05-30: 80 ug via INTRAVENOUS
  Administered 2014-05-30: 120 ug via INTRAVENOUS

## 2014-05-30 MED ORDER — ONDANSETRON HCL 4 MG/2ML IJ SOLN
INTRAMUSCULAR | Status: DC | PRN
  Administered 2014-05-30: 4 mg via INTRAVENOUS

## 2014-05-30 NOTE — Anesthesia Procedure Notes (Signed)
Procedure Name: Intubation Date/Time: 05/30/2014 11:36 AM Performed by: Julian Reil Pre-anesthesia Checklist: Patient identified, Emergency Drugs available, Suction available and Patient being monitored Patient Re-evaluated:Patient Re-evaluated prior to inductionOxygen Delivery Method: Circle system utilized Preoxygenation: Pre-oxygenation with 100% oxygen Intubation Type: IV induction Ventilation: Mask ventilation without difficulty Laryngoscope Size: Mac and 4 Grade View: Grade I Tube type: Oral Tube size: 7.5 mm Number of attempts: 1 Airway Equipment and Method: Stylet Placement Confirmation: ETT inserted through vocal cords under direct vision,  positive ETCO2 and breath sounds checked- equal and bilateral Secured at: 22 cm Tube secured with: Tape Dental Injury: Teeth and Oropharynx as per pre-operative assessment

## 2014-05-30 NOTE — Anesthesia Preprocedure Evaluation (Signed)
Anesthesia Evaluation  Patient identified by MRN, date of birth, ID band Patient awake    Reviewed: Allergy & Precautions, H&P , NPO status , Patient's Chart, lab work & pertinent test results  Airway Mallampati: I TM Distance: >3 FB Neck ROM: Full    Dental   Pulmonary former smoker,          Cardiovascular hypertension, Pt. on medications + CAD and + Past MI + dysrhythmias + pacemaker     Neuro/Psych TIA   GI/Hepatic GERD-  Medicated and Controlled,  Endo/Other  diabetes, Type 2, Oral Hypoglycemic Agents  Renal/GU CRFRenal disease     Musculoskeletal   Abdominal   Peds  Hematology   Anesthesia Other Findings   Reproductive/Obstetrics                           Anesthesia Physical Anesthesia Plan  ASA: III  Anesthesia Plan: General   Post-op Pain Management:    Induction: Intravenous  Airway Management Planned: Oral ETT  Additional Equipment:   Intra-op Plan:   Post-operative Plan: Extubation in OR  Informed Consent: I have reviewed the patients History and Physical, chart, labs and discussed the procedure including the risks, benefits and alternatives for the proposed anesthesia with the patient or authorized representative who has indicated his/her understanding and acceptance.     Plan Discussed with: CRNA and Surgeon  Anesthesia Plan Comments:         Anesthesia Quick Evaluation

## 2014-05-30 NOTE — Progress Notes (Signed)
Utilization review completed.  

## 2014-05-30 NOTE — Progress Notes (Signed)
Asked by Dr. Collene Mares to evaluate for possible ERCP. I have taken a history, examined the patient, reviewed imaging studies and discussed the case with Dr. Collene Mares and Dr. Kathlene Cote. Pt was cared for by Dr. Benson Norway in 2011 and 2012.  Biliary stricture with obstruction and cholangitis. IR placed a covered metal biliary stent in 2012 after failed attempts at ERCP (unable to locate ampulla) however the biliary stent is no longer in place. Dr. Kathlene Cote feels that PTC would be difficult with non dilated intrahepatic ducts and with air in the left intrahepatic ducts. Will attempt ERCP however cannulation may not be successful as in prior ERCP attempts. If ERCP is not successful PTC or transfer to a tertiary center will need to be considered. Wife is not available to provide consent and I given the clinical circumstances I feel it is necessary to proceed promptly. I have explained the procedure, its risks, benefits and alternatives to the patient and he consents.

## 2014-05-30 NOTE — Transfer of Care (Signed)
Immediate Anesthesia Transfer of Care Note  Patient: [name]  Procedure(s) Performed: Procedure(s): ENDOSCOPIC RETROGRADE CHOLANGIOPANCREATOGRAPHY (ERCP) (N/A)  Patient Location: PACU  Anesthesia Type:General  Level of Consciousness: awake, alert , oriented and patient cooperative  Airway & Oxygen Therapy: Patient Spontanous Breathing and Patient connected to nasal cannula oxygen  Post-op Assessment: Report given to PACU RN  Post vital signs: Reviewed and stable  Complications: No apparent anesthesia complications

## 2014-05-30 NOTE — Interval H&P Note (Signed)
History and Physical Interval Note:  05/30/2014 11:36 AM  [name]  has presented today for surgery, with the diagnosis of cholangitis, CBD obstruction  The various methods of treatment have been discussed with the patient and family. After consideration of risks, benefits and other options for treatment, the patient has consented to  Procedure(s): ENDOSCOPIC RETROGRADE CHOLANGIOPANCREATOGRAPHY (ERCP) (N/A) as a surgical intervention .  [name] history has been reviewed, patient examined, no change in status, stable for surgery.  I have reviewed the patient's chart and labs.  Questions were answered to the patient's satisfaction.     Pricilla Riffle. Fuller Plan MD

## 2014-05-30 NOTE — H&P (View-Only) (Signed)
Cross cover [facility] Reason for Consult: Biliary sepsis. Referring Physician: THP  [name] is an 80 y.o. male.  HPI: 80 year old, white male with multiple medical issues listed below, was brought to the ER by his wife as he felt weak and had shaking chills. He was found to be febrile with leukocytosis and abnormal LFT's, consistent with biliary sepsis. He has a very complicated history from a GI standpoint. He has had several bouts of recurrent pancreatitis and has had a pancreatic stent placed in the past. He has also received care at [facility] as well. Upon receiving antibiotics he has become afebrile. He is unable to give much history as he is [name], [name] and has dementia. He denies having any abdominal pain, nausea or vomiting. He has had 3 failed  ERCP's in the past. I am not sure when his stent was removed but there was no stent noted on his recent CT scan done on admission. He has pneumobilia as expect with a 17 mm CBD.   Past Medical History  Diagnosis Date  .    Marland Kitchen Coronary artery disease   . Diabetes mellitus   . Hypertension   . Hyperlipidemia   . Obesities, morbid   . Gallstone pancreatitis     recurrent  . TIA (transient ischemic attack)   . Bacteremia due to vancomycin resistant Enterococcus   . GERD (gastroesophageal reflux disease)   . Hearing impairment   . Myocardial infarction, anterior wall, subsequent care   . Second degree AV block, Mobitz type II     s/p PPM by JA 4/12  . ARF (acute renal failure)   . Cholangitis   . Duodenal ulcer   . GI bleed   . Anemia associated with acute blood loss   . 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 Dementia in Alzheimer's disease with delirium    Past Surgical History  Procedure Laterality Date  . Cholecystectomy    . Pacemaker placement  01/01/11    implanted by JA for mobitz II AV block  . Pancreatic stent     Family History  Problem Relation Age of Onset  . Hypertension Other    Social  History:  reports that he quit smoking about 18 years ago. His smoking use included Cigarettes. He has a 57 pack-year smoking history. He has never used smokeless tobacco. He reports that he does not drink alcohol or use illicit drugs.  Allergies:  Allergies  Allergen Reactions  . Doxycycline Other (See Comments)    Unknown- patient doesn't remember  . Penicillins Rash   Medications: I have reviewed the patient's current medications.  Results for orders placed during the hospital encounter of 05/28/14 (from the past 48 hour(s))  TROPONIN I     Status: None   Collection Time    05/28/14  2:00 PM      Result Value Ref Range   Troponin I <0.30  <0.30 ng/mL   Comment:            Due to the release kinetics of cTnI,     a negative result within the first hours     of the onset of symptoms does not rule out     myocardial infarction with certainty.     If myocardial infarction is still suspected,     repeat the test at appropriate intervals.  CBC WITH DIFFERENTIAL     Status: Abnormal   Collection Time    05/28/14  2:00 PM      Result Value Ref Range   WBC 11.7 (*) 4.0 - 10.5 K/uL   RBC 4.45  4.22 - 5.81 MIL/uL   Hemoglobin 13.8  13.0 - 17.0 g/dL   HCT 41.8  39.0 - 52.0 %   MCV 93.9  78.0 - 100.0 fL   MCH 31.0  26.0 - 34.0 pg   MCHC 33.0  30.0 - 36.0 g/dL   RDW 13.2  11.5 - 15.5 %   Platelets 163  150 - 400 K/uL   Neutrophils Relative % 94 (*) 43 - 77 %   Neutro Abs 11.0 (*) 1.7 - 7.7 K/uL   Lymphocytes Relative 3 (*) 12 - 46 %   Lymphs Abs 0.4 (*) 0.7 - 4.0 K/uL   Monocytes Relative 3  3 - 12 %   Monocytes Absolute 0.4  0.1 - 1.0 K/uL   Eosinophils Relative 0  0 - 5 %   Eosinophils Absolute 0.0  0.0 - 0.7 K/uL   Basophils Relative 0  0 - 1 %   Basophils Absolute 0.0  0.0 - 0.1 K/uL  COMPREHENSIVE METABOLIC PANEL     Status: Abnormal   Collection Time    05/28/14  2:00 PM      Result Value Ref Range   Sodium 138  137 - 147 mEq/L   Potassium 4.3  3.7 - 5.3 mEq/L    Chloride 97  96 - 112 mEq/L   CO2 22  19 - 32 mEq/L   Glucose, Bld 326 (*) 70 - 99 mg/dL   BUN 13  6 - 23 mg/dL   Creatinine, Ser 1.10  0.50 - 1.35 mg/dL   Calcium 9.5  8.4 - 10.5 mg/dL   Total Protein 6.7  6.0 - 8.3 g/dL   Albumin 3.0 (*) 3.5 - 5.2 g/dL   AST 580 (*) 0 - 37 U/L   Comment: SLIGHT HEMOLYSIS   ALT 179 (*) 0 - 53 U/L   Alkaline Phosphatase 361 (*) 39 - 117 U/L   Total Bilirubin 1.9 (*) 0.3 - 1.2 mg/dL   GFR calc non Af Amer 60 (*) >90 mL/min   GFR calc Af Amer 69 (*) >90 mL/min   Comment: (NOTE)     The eGFR has been calculated using the CKD EPI equation.     This calculation has not been validated in all clinical situations.     eGFR's persistently <90 mL/min signify possible Chronic Kidney     Disease.   Anion gap 19 (*) 5 - 15  PRO B NATRIURETIC PEPTIDE     Status: None   Collection Time    05/28/14  2:00 PM      Result Value Ref Range   Pro B Natriuretic peptide (BNP) 434.4  0 - 450 pg/mL  I-STAT CG4 LACTIC ACID, ED     Status: Abnormal   Collection Time    05/28/14  2:05 PM      Result Value Ref Range   Lactic Acid, Venous 3.80 (*) 0.5 - 2.2 mmol/L  I-STAT ARTERIAL BLOOD GAS, ED     Status: None   Collection Time    05/28/14  2:18 PM      Result Value Ref Range   pH, Arterial 7.422  7.350 - 7.450   pCO2 arterial 37.7  35.0 - 45.0 mmHg   pO2, Arterial 88.0  80.0 - 100.0 mmHg   Bicarbonate 24.0  20.0 - 24.0 mEq/L   TCO2 25  0 - 100 mmol/L   O2 Saturation 96.0     Acid-Base Excess 1.0  0.0 - 2.0 mmol/L   Patient temperature 103.4 F     Sample type ARTERIAL    URINALYSIS, ROUTINE W REFLEX MICROSCOPIC     Status: Abnormal   Collection Time    05/28/14  2:45 PM      Result Value Ref Range   Color, Urine YELLOW  YELLOW   APPearance CLEAR  CLEAR   Specific Gravity, Urine 1.013  1.005 - 1.030   pH 7.0  5.0 - 8.0   Glucose, UA 500 (*) NEGATIVE mg/dL   Hgb urine dipstick NEGATIVE  NEGATIVE   Bilirubin Urine SMALL (*) NEGATIVE   Ketones, ur NEGATIVE   NEGATIVE mg/dL   Protein, ur NEGATIVE  NEGATIVE mg/dL   Urobilinogen, UA 4.0 (*) 0.0 - 1.0 mg/dL   Nitrite NEGATIVE  NEGATIVE   Leukocytes, UA NEGATIVE  NEGATIVE   Comment: MICROSCOPIC NOT DONE ON URINES WITH NEGATIVE PROTEIN, BLOOD, LEUKOCYTES, NITRITE, OR GLUCOSE <1000 mg/dL.  LIPASE, BLOOD     Status: None   Collection Time    05/28/14  3:04 PM      Result Value Ref Range   Lipase 27  11 - 59 U/L  CBG MONITORING, ED     Status: Abnormal   Collection Time    05/28/14  7:29 PM      Result Value Ref Range   Glucose-Capillary 221 (*) 70 - 99 mg/dL  GLUCOSE, CAPILLARY     Status: Abnormal   Collection Time    05/28/14  8:29 PM      Result Value Ref Range   Glucose-Capillary 237 (*) 70 - 99 mg/dL  MRSA PCR SCREENING     Status: None   Collection Time    05/28/14  8:42 PM      Result Value Ref Range   MRSA by PCR NEGATIVE  NEGATIVE   Comment:            The GeneXpert MRSA Assay (FDA     approved for NASAL specimens     only), is one component of a     comprehensive MRSA colonization     surveillance program. It is not     intended to diagnose MRSA     infection nor to guide or     monitor treatment for     MRSA infections.  GLUCOSE, CAPILLARY     Status: Abnormal   Collection Time    05/28/14 10:08 PM      Result Value Ref Range   Glucose-Capillary 209 (*) 70 - 99 mg/dL  CBC     Status: Abnormal   Collection Time    05/29/14  2:48 AM      Result Value Ref Range   WBC 14.5 (*) 4.0 - 10.5 K/uL   RBC 3.67 (*) 4.22 - 5.81 MIL/uL   Hemoglobin 11.5 (*) 13.0 - 17.0 g/dL   HCT 35.0 (*) 39.0 - 52.0 %   MCV 95.4  78.0 - 100.0 fL   MCH 31.3  26.0 - 34.0 pg   MCHC 32.9  30.0 - 36.0 g/dL   RDW 13.6  11.5 - 15.5 %   Platelets 162  150 - 400 K/uL  COMPREHENSIVE METABOLIC PANEL     Status: Abnormal   Collection Time    05/29/14  2:48 AM      Result Value Ref Range   Sodium 139  137 - 147  mEq/L   Potassium 4.3  3.7 - 5.3 mEq/L   Chloride 101  96 - 112 mEq/L   CO2 27  19 - 32  mEq/L   Glucose, Bld 166 (*) 70 - 99 mg/dL   BUN 15  6 - 23 mg/dL   Creatinine, Ser 1.28  0.50 - 1.35 mg/dL   Calcium 8.4  8.4 - 10.5 mg/dL   Total Protein 5.2 (*) 6.0 - 8.3 g/dL   Albumin 2.3 (*) 3.5 - 5.2 g/dL   AST 476 (*) 0 - 37 U/L   Comment: SLIGHT HEMOLYSIS   ALT 239 (*) 0 - 53 U/L   Alkaline Phosphatase 258 (*) 39 - 117 U/L   Total Bilirubin 2.6 (*) 0.3 - 1.2 mg/dL   GFR calc non Af Amer 50 (*) >90 mL/min   GFR calc Af Amer 58 (*) >90 mL/min   Comment: (NOTE)     The eGFR has been calculated using the CKD EPI equation.     This calculation has not been validated in all clinical situations.     eGFR's persistently <90 mL/min signify possible Chronic Kidney     Disease.   Anion gap 11  5 - 15  GLUCOSE, CAPILLARY     Status: Abnormal   Collection Time    05/29/14  8:25 AM      Result Value Ref Range   Glucose-Capillary 166 (*) 70 - 99 mg/dL   Comment 1 Documented in Chart     Comment 2 Notify RN     Ct Abdomen Pelvis W Contrast  05/28/2014   CLINICAL DATA:  Shortness of breath and upper abdominal pain after mowing lawn earlier this morning.  EXAM: CT ABDOMEN AND PELVIS WITH CONTRAST  TECHNIQUE: Multidetector CT imaging of the abdomen and pelvis was performed using the standard protocol following bolus administration of intravenous contrast.  CONTRAST:  41mL OMNIPAQUE IOHEXOL 300 MG/ML SOLN, 176mL OMNIPAQUE IOHEXOL 300 MG/ML SOLN  COMPARISON:  CT the abdomen pelvis - 04/19/2011; CT of the chest, abdomen pelvis - 08/26/2009; fluoroscopic guided biliary stent placement - 05/25/2011  FINDINGS: Normal hepatic contour. No discrete hepatic lesions. There has been removal of previously placed percutaneous biliary stent. Pneumobilia is seen within the persistently dilated common bile duct as well as the nondependent portions of the biliary tree of the left lobe of the liver. Again, the common bile duct appears enlarged measuring approximately 1.7 cm and maximal coronal dimension (image 51,  series 5), similar to the 2010 examination. Post cholecystectomy. No ascites.  There is symmetric enhancement and excretion of the bilateral kidneys. Bilateral sub cm hypo attenuating renal lesions too small to actually characterize of favored to represent renal cysts. No definite renal stones this postcontrast examination. There is a minimal amount of grossly symmetric age and body habitus related perinephric stranding. No urinary obstruction. Normal appearance of the bilateral adrenal glands, pancreas and spleen.  Ingested enteric contrast extends to the level of the mid/distal small bowel. No evidence of enteric obstruction. Normal appearance of the appendix. Moderate colonic stool burden without evidence of obstruction. Scattered minimal colonic diverticulosis without evidence of diverticulitis. No pneumoperitoneum, pneumatosis or portal venous gas.  Scattered mixed calcified and noncalcified atherosclerotic plaque throughout the abdominal aorta. There is a minimal amounts of bilobed ectasia involving the infrarenal abdominal aorta with dominant cranial component measuring approximately 2.9 cm in maximal coronal diameter (coronal image 58, series 5), grossly unchanged since the 2010 examination. The major branch vessels of the abdominal aorta  appear patent on this non CTA examination.  No retroperitoneal, mesenteric, pelvic or inguinal lymphadenopathy.  Prostatic calcifications. Normal appearance of the urinary bladder given degree distention. No free fluid within the pelvis.  Limited visualization of the lower thorax demonstrates an approximately 4 mm nodule within the right lower lobe (image 4, series 4) which is unchanged since the 2010 examination and thus of benign etiology. There is minimal subsegmental atelectasis within the imaged caudal aspects of the right middle lobe and lingula. There is minimal dependent subsegmental atelectasis within the bilateral lower lobes. No discrete focal airspace opacities.  No pleural effusion.  Cardiomegaly. Unchanged trace amount of pericardial fluid. Pacemaker leads are seen within the right atrium and ventricle. Coronary artery calcifications. Calcifications with the aortic valve leaflets are suspected though incompletely evaluated.  No acute or aggressive osseus abnormalities. Moderate multilevel lumbar spine DDD, worse at L3-L4 and L4-L5 with disc space height loss, endplate irregularity and sclerosis. Mild degenerative change of the bilateral hips, right greater than left.  Moderate-sized left-sided mixed direct and indirect mesenteric fat containing inguinal hernia. Regional soft tissues appear otherwise normal.  IMPRESSION: 1. No definite explanation for patient's upper abdominal pain and shortness of breath. 2. Post removal of percutaneous biliary stents with persistent dilatation of the common bile duct measuring approximately 1.7 cm in diameter, similar to prior examinations. Expected pneumobilia post biliary sphincterotomy. 3. Minimal colonic diverticulosis without evidence of diverticulitis. 4. Coronary artery calcifications. 5. Cardiomegaly. Small amount of pericardial fluid, unchanged since the 2010 examination and less presumably physiologic in etiology. 6. Unchanged ectasia of the infrarenal abdominal aorta measuring approximately 2.9 cm in diameter, stable since the 2010 examination. Recommend follow up by [place] in 5 years. This recommendation follows ACR consensus guidelines: White Paper of the ACR Incidental Findings Committee II on Vascular Findings. J Am Coll Radiol 2013; 10:789-794.   Electronically Signed   By: Simonne Come M.D.   On: 05/28/2014 16:12   Dg Chest Portable 1 View  05/28/2014   CLINICAL DATA:  Shortness of breath and left-sided chest pain following exertion; history of coronary artery disease and diabetes  EXAM: PORTABLE CHEST - 1 VIEW  COMPARISON:  Portable chest x-ray dated October 29, 2012  FINDINGS: The lungs are well-expanded. The interstitial  markings are coarse. The cardiopericardial silhouette is enlarged. The pulmonary vascularity is mildly prominent. The permanent pacemaker is unchanged in position. The bony thorax exhibits no acute abnormalities.  IMPRESSION: COPD with low-grade CHF. There is no focal pneumonia nor alveolar edema.   Electronically Signed   By: David  [place]   On: 05/28/2014 14:16   US Abdomen Limited Ruq  05/29/2014   CLINICAL DATA:  Abdominal pain/ vomiting, status post cholecystectomy  EXAM: US ABDOMEN LIMITED - RIGHT UPPER QUADRANT  COMPARISON:  CT abdomen pelvis dated 05/28/2014  FINDINGS: Gallbladder:  Surgically absent.  Common bile duct:  Diameter: 14 mm.  Liver:  Coarse hepatic echotexture with increased parenchymal echogenicity, likely reflecting hepatic steatosis. Pneumobilia.  IMPRESSION: Status post cholecystectomy.  Common duct measures 14 mm, unchanged from CT.  Pneumobilia.  Suspected hepatic steatosis.   Electronically Signed   By: Charline Bills M.D.   On: 05/29/2014 10:04   Review of Systems  Constitutional: Positive for fever, chills and malaise/fatigue. Negative for weight loss and diaphoresis.  HENT: Negative.   Respiratory: Positive for shortness of breath.   Gastrointestinal: Positive for abdominal pain.  Musculoskeletal: Positive for joint pain.  Neurological: Positive for weakness.  Psychiatric/Behavioral: The patient is  nervous/anxious.    Blood pressure 123/70, pulse 86, temperature 98.2 F (36.8 C), temperature source Oral, resp. rate 20, height $RemoveBe'5\' 8"'uKLJsFZPo$  (1.727 m), weight 93.6 kg (206 lb 5.6 oz), SpO2 98.00%. Physical Exam  Constitutional: He appears well-developed and well-nourished. He appears lethargic.  HENT:  Head: Normocephalic.  Eyes: Conjunctivae and EOM are normal. Pupils are equal, round, and reactive to light.  Neck: Normal range of motion. Neck supple.  Cardiovascular: Normal rate and regular rhythm.   Respiratory: Breath sounds normal.  GI: Bowel sounds are normal. He  exhibits no distension and no mass. There is no tenderness. There is no rebound and no guarding.  Neurological: He appears lethargic. He is disoriented.  Skin: Skin is warm and dry.   Assessment/Plan: 1) Biliary sepsis with fever, abdominal pain and abnormal LFT's. I have reviewed his chart extensively and discussed this with Dr. Lucio Edward and Dr. Wallene Dales in IR. The patient has had 3 failed ERCP's and has had pancreatic stenting in the past for recurrent pancreatitis. There is no stent present at this time. His CBD is dialated with no change from the last time he was imaged. As per my discussion with Dr. Wallene Dales, this will a very difficult procedure both from an ERCP standpoint and from an IR standpoint. The plans are therefore to continue antibiotics and monitor him closely. ERCP in AM.  2) GERD.  3) History of gallstone pancreatitis.  4) HTN/Hyperlipidemia/CHF/CAD/[employer]'s pacermaker . 5) AODM. 6) Chronic renal insufficiency.  [name] 05/29/2014, 11:26 AM

## 2014-05-30 NOTE — Op Note (Addendum)
[facility] [place], [id number]   ERCP PROCEDURE REPORT  PATIENT: [name], [name].  MR# :[id number] BIRTHDATE: Feb 09, 1927  GENDER: Male ENDOSCOPIST: Ladene Artist, MD, [facility] REFERRED BY: [facility] PROCEDURE DATE:  05/30/2014 PROCEDURE:   ERCP with sphincterotomy/papillotomy and ERCP with biliary stent placement ASA CLASS:   Class III INDICATIONS:established ascending cholangitis.  h/o bile duct stricture.   abnormal liver function test . MEDICATIONS: General endotracheal anesthesia (GETA) (none) TOPICAL ANESTHETIC: none DESCRIPTION OF PROCEDURE:   After the risks benefits and alternatives of the procedure were thoroughly explained, informed consent was obtained.  The [id number] ([id number])  endoscope was introduced through the mouth  and advanced to the third portion of the duodenum .  1.  There was a large periampullary diverticulum in in the distal part of D2 which completely surrounded the ampulla. Ampulla was difficult to locate. It was located at the deepest point in the diverticulum. CBD was freely cannulated with a catheter and then with a guidewire. Positioning was difficult with the large periampullary diverticulum in the distal part of D2. PD was not cannulated or injected by intention. 2.  Three large filling defects were seen in the distal common bile duct-appeared to be sludge and stones. 3.  There was dilation of the common bile duct measuring 17 mm down to the level of the ampulla. Intrahepatic ducts were not dilated. 4.  With guidewire in the bile duct, a small biliary sphincterotomy was performed using the sphincterotome. I was unable to safely extend the sphincterotomy to an adequate size due to the diverticulum. The sphincterotomy was not large enough for balloon extraction of the filling defects and positioning was challenging.  5.  Under endoscopic and fluoroscopic guidance a 10 Fr x 7 cm double flap stent  was placed in the bile duct. Sludge, bile and contast slowly drained from the stent. The stent was in very good position both by fluroscopy and endoscopy. The scope was then completely withdrawn from the patient and the procedure terminated.     COMPLICATIONS: .  There were no complications.  ENDOSCOPIC IMPRESSION: 1.   Large periampullary diverticulum 2.   Filling defects in the distal common bile duct 3.   Dilation of the common bile duct 4.   Small biliary sphincterotomy performed 5.   10 Fr x 7 cm biliary stent placed  RECOMMENDATIONS: 1.  IV antibiotics 2.  trend liver enzymes   eSigned:  Ladene Artist, MD, [facility] 05/30/2014 12:46 PM Revised: 05/30/2014 12:46 PM  CC: Carol Ada, MD

## 2014-05-30 NOTE — Progress Notes (Addendum)
[place] for Vancomycin, Levaquin, Aztreonam Indication: rule out sepsis  Allergies  Allergen Reactions  . Doxycycline Other (See Comments)    Unknown- patient doesn't remember  . Penicillins Rash    Patient Measurements: Height: 5\' 8"  (172.7 cm) Weight: 206 lb 5.6 oz (93.6 kg) IBW/kg (Calculated) : 68.4 Actual Body Weight: 97.1 kg Ideal Body Weight: 66 kg Adjusted Body Weight: 78.5 kg  Vital Signs: Temp: 98 F (36.7 C) (09/06 0725) Temp src: Axillary (09/06 0725) BP: 136/97 mmHg (09/06 0725) Pulse Rate: 102 (09/06 0725) Intake/Output from previous day: 09/05 0701 - 09/06 0700 In: 3151.7 [P.O.:480; I.V.:2221.7; IV Piggyback:450] Out: 1225 [Urine:1225] Intake/Output from this shift: Total I/O In: 300 [I.V.:300] Out: 250 [Urine:250]  Labs:  Recent Labs  05/28/14 1400 05/29/14 0248 05/30/14 0331  WBC 11.7* 14.5* 7.8  HGB 13.8 11.5* 11.9*  PLT 163 162 164  CREATININE 1.10 1.28 1.15   Estimated Creatinine Clearance: 53.1 ml/min (by C-G formula based on Cr of 1.15). No results found for this basename: VANCOTROUGH, VANCOPEAK, VANCORANDOM, Terminous, GENTPEAK, GENTRANDOM, TOBRATROUGH, TOBRAPEAK, TOBRARND, AMIKACINPEAK, AMIKACINTROU, AMIKACIN,  in the last 72 hours   Microbiology: Recent Results (from the past 720 hour(s))  CULTURE, BLOOD (ROUTINE X 2)     Status: None   Collection Time    05/28/14  2:00 PM      Result Value Ref Range Status   Specimen Description BLOOD LEFT FA   Final   Special Requests BOTTLES DRAWN AEROBIC AND ANAEROBIC [facility] EACH   Final   Culture  Setup Time     Final   Value: 05/28/2014 18:40     Performed at [facility]   Culture     Final   Value:        BLOOD CULTURE RECEIVED NO GROWTH TO DATE CULTURE WILL BE HELD FOR 5 DAYS BEFORE ISSUING A FINAL NEGATIVE REPORT     Performed at [facility]   Report Status PENDING   Incomplete  CULTURE, BLOOD (ROUTINE X 2)     Status: None   Collection Time    05/28/14  2:15 PM      Result Value Ref Range Status   Specimen Description BLOOD LEFT WRIST   Final   Special Requests BOTTLES DRAWN AEROBIC AND ANAEROBIC [facility] EACH   Final   Culture  Setup Time     Final   Value: 05/28/2014 18:40     Performed at [facility]   Culture     Final   Value: GRAM NEGATIVE RODS     Note: Gram Stain Report Called to,Read Back By and Verified With: CAROL SCHILLER 05/29/14 @ 345PM BY RUSCOE A.     Performed at [facility]   Report Status PENDING   Incomplete  URINE CULTURE     Status: None   Collection Time    05/28/14  3:46 PM      Result Value Ref Range Status   Specimen Description URINE, CATHETERIZED   Final   Special Requests NONE   Final   Culture  Setup Time     Final   Value: 05/28/2014 18:43     Performed at [place]     Final   Value: NO GROWTH     Performed at [facility]   Culture     Final   Value: NO GROWTH     Performed at [facility]  Report Status 05/29/2014 FINAL   Final  MRSA PCR SCREENING     Status: None   Collection Time    05/28/14  8:42 PM      Result Value Ref Range Status   MRSA by PCR NEGATIVE  NEGATIVE Final   Comment:            The GeneXpert MRSA Assay (FDA     approved for NASAL specimens     only), is one component of a     comprehensive MRSA colonization     surveillance program. It is not     intended to diagnose MRSA     infection nor to guide or     monitor treatment for     MRSA infections.  CULTURE, EXPECTORATED SPUTUM-ASSESSMENT     Status: None   Collection Time    05/30/14  7:33 AM      Result Value Ref Range Status   Specimen Description SPUTUM   Final   Special Requests NONE   Final   Sputum evaluation     Final   Value: MICROSCOPIC FINDINGS SUGGEST THAT THIS SPECIMEN IS NOT REPRESENTATIVE OF LOWER RESPIRATORY SECRETIONS. PLEASE RECOLLECT.     CALLED TO CAROL SCHILLER AT [id number] BY [facility]   Report Status 05/30/2014  FINAL   Final   Assessment: 80yo male presented to [facility] with SOB. Pt is currently afebrile 99.2, tachycardic, WBC now down to 7.8, sCr 1.1. Started on empiric vancomycin, levaquin, and aztreonam for sepsis rule-out. Plan to continue IV abx for now.  9/6 resp cx - recollect 9/4 bld x2 - 1/2 GNR 9/4 urine - ng  Goal of Therapy:  Vancomycin trough level 15-20 mcg/ml  Plan:  Continue Vancomycin 750mg  IV q12h Continue Levaquin 750mg  IV q24h Continue Aztreonam 2g IV q8h Measure antibiotic drug levels at steady state Follow up culture results  Erin Hearing PharmD., BCPS Clinical Pharmacist Pager [phone] 05/30/2014 10:22 AM

## 2014-05-30 NOTE — Anesthesia Postprocedure Evaluation (Signed)
Anesthesia Post Note  Patient: [name]  Procedure(s) Performed: Procedure(s) (LRB): ENDOSCOPIC RETROGRADE CHOLANGIOPANCREATOGRAPHY (ERCP) (N/A)  Anesthesia type: general  Patient location: PACU  Post pain: Pain level controlled  Post assessment: Patient's Cardiovascular Status Stable  Last Vitals:  Filed Vitals:   05/30/14 1300  BP: 140/79  Pulse: 113  Temp:   Resp: 21    Post vital signs: Reviewed and stable  Level of consciousness: sedated  Complications: No apparent anesthesia complications

## 2014-05-30 NOTE — Progress Notes (Signed)
Cross cover [facility]  Subjective: Since I last evaluated the patient, he seems to be doing fairly well. Has remained afebrile.   Objective: Vital signs in last 24 hours: Temp:  [97.8 F (36.6 C)-99.2 F (37.3 C)] 98.6 F (37 C) (09/06 0414) Pulse Rate:  [81-102] 102 (09/06 0414) Resp:  [18-24] 18 (09/06 0414) BP: (118-156)/(64-98) 156/89 mmHg (09/06 0414) SpO2:  [91 %-98 %] 91 % (09/06 0414)    Intake/Output from previous day: 09/05 0701 - 09/06 0700 In: 2951.7 [P.O.:480; I.V.:2221.7; IV Piggyback:250] Out: 1225 [Urine:1225] Intake/Output this shift:   General appearance: alert, cooperative, appears stated age and no distress Resp: clear to auscultation bilaterally Cardio: regular rate and rhythm, S1, S2 normal, no murmur, click, rub or gallop GI: soft, non-tender; bowel sounds normal; no masses,  no organomegaly  Lab Results:  Recent Labs  05/28/14 1400 05/29/14 0248 05/30/14 0331  WBC 11.7* 14.5* 7.8  HGB 13.8 11.5* 11.9*  HCT 41.8 35.0* 36.3*  PLT 163 162 164   BMET  Recent Labs  05/28/14 1400 05/29/14 0248 05/30/14 0331  NA 138 139 137  K 4.3 4.3 3.9  CL 97 101 101  CO2 22 27 25   GLUCOSE 326* 166* 150*  BUN 13 15 14   CREATININE 1.10 1.28 1.15  CALCIUM 9.5 8.4 8.4   LFT  Recent Labs  05/30/14 0331  PROT 5.4*  ALBUMIN 2.3*  AST 182*  ALT 163*  ALKPHOS 234*  BILITOT 1.1   Studies/Results: Ct Abdomen Pelvis W Contrast  05/28/2014   CLINICAL DATA:  Shortness of breath and upper abdominal pain after mowing lawn earlier this morning.  EXAM: CT ABDOMEN AND PELVIS WITH CONTRAST  TECHNIQUE: Multidetector CT imaging of the abdomen and pelvis was performed using the standard protocol following bolus administration of intravenous contrast.  CONTRAST:  2mL OMNIPAQUE IOHEXOL 300 MG/ML SOLN, 165mL OMNIPAQUE IOHEXOL 300 MG/ML SOLN  COMPARISON:  CT the abdomen pelvis - 04/19/2011; CT of the chest, abdomen pelvis - 08/26/2009; fluoroscopic guided biliary stent  placement - 05/25/2011  FINDINGS: Normal hepatic contour. No discrete hepatic lesions. There has been removal of previously placed percutaneous biliary stent. Pneumobilia is seen within the persistently dilated common bile duct as well as the nondependent portions of the biliary tree of the left lobe of the liver. Again, the common bile duct appears enlarged measuring approximately 1.7 cm and maximal coronal dimension (image 51, series 5), similar to the 2010 examination. Post cholecystectomy. No ascites.  There is symmetric enhancement and excretion of the bilateral kidneys. Bilateral sub cm hypo attenuating renal lesions too small to actually characterize of favored to represent renal cysts. No definite renal stones this postcontrast examination. There is a minimal amount of grossly symmetric age and body habitus related perinephric stranding. No urinary obstruction. Normal appearance of the bilateral adrenal glands, pancreas and spleen.  Ingested enteric contrast extends to the level of the mid/distal small bowel. No evidence of enteric obstruction. Normal appearance of the appendix. Moderate colonic stool burden without evidence of obstruction. Scattered minimal colonic diverticulosis without evidence of diverticulitis. No pneumoperitoneum, pneumatosis or portal venous gas.  Scattered mixed calcified and noncalcified atherosclerotic plaque throughout the abdominal aorta. There is a minimal amounts of bilobed ectasia involving the infrarenal abdominal aorta with dominant cranial component measuring approximately 2.9 cm in maximal coronal diameter (coronal image 58, series 5), grossly unchanged since the 2010 examination. The major branch vessels of the abdominal aorta appear patent on this non CTA examination.  No retroperitoneal,  mesenteric, pelvic or inguinal lymphadenopathy.  Prostatic calcifications. Normal appearance of the urinary bladder given degree distention. No free fluid within the pelvis.  Limited  visualization of the lower thorax demonstrates an approximately 4 mm nodule within the right lower lobe (image 4, series 4) which is unchanged since the 2010 examination and thus of benign etiology. There is minimal subsegmental atelectasis within the imaged caudal aspects of the right middle lobe and lingula. There is minimal dependent subsegmental atelectasis within the bilateral lower lobes. No discrete focal airspace opacities. No pleural effusion.  Cardiomegaly. Unchanged trace amount of pericardial fluid. Pacemaker leads are seen within the right atrium and ventricle. Coronary artery calcifications. Calcifications with the aortic valve leaflets are suspected though incompletely evaluated.  No acute or aggressive osseus abnormalities. Moderate multilevel lumbar spine DDD, worse at L3-L4 and L4-L5 with disc space height loss, endplate irregularity and sclerosis. Mild degenerative change of the bilateral hips, right greater than left.  Moderate-sized left-sided mixed direct and indirect mesenteric fat containing inguinal hernia. Regional soft tissues appear otherwise normal.  IMPRESSION: 1. No definite explanation for patient's upper abdominal pain and shortness of breath. 2. Post removal of percutaneous biliary stents with persistent dilatation of the common bile duct measuring approximately 1.7 cm in diameter, similar to prior examinations. Expected pneumobilia post biliary sphincterotomy. 3. Minimal colonic diverticulosis without evidence of diverticulitis. 4. Coronary artery calcifications. 5. Cardiomegaly. Small amount of pericardial fluid, unchanged since the 2010 examination and less presumably physiologic in etiology. 6. Unchanged ectasia of the infrarenal abdominal aorta measuring approximately 2.9 cm in diameter, stable since the 2010 examination. Recommend follow up by [place] in 5 years. This recommendation follows ACR consensus guidelines: White Paper of the ACR Incidental Findings Committee II on  Vascular Findings. J Am Coll Radiol 2013; 10:789-794.   Electronically Signed   By: Sandi Mariscal M.D.   On: 05/28/2014 16:12   Dg Chest Portable 1 View  05/28/2014   CLINICAL DATA:  Shortness of breath and left-sided chest pain following exertion; history of coronary artery disease and diabetes  EXAM: PORTABLE CHEST - 1 VIEW  COMPARISON:  Portable chest x-ray dated October 29, 2012  FINDINGS: The lungs are well-expanded. The interstitial markings are coarse. The cardiopericardial silhouette is enlarged. The pulmonary vascularity is mildly prominent. The permanent pacemaker is unchanged in position. The bony thorax exhibits no acute abnormalities.  IMPRESSION: COPD with low-grade CHF. There is no focal pneumonia nor alveolar edema.   Electronically Signed   By: David  Martinique   On: 05/28/2014 14:16   US Abdomen Limited Ruq  05/29/2014   CLINICAL DATA:  Abdominal pain/ vomiting, status post cholecystectomy  EXAM: US ABDOMEN LIMITED - RIGHT UPPER QUADRANT  COMPARISON:  CT abdomen pelvis dated 05/28/2014  FINDINGS: Gallbladder:  Surgically absent.  Common bile duct:  Diameter: 14 mm.  Liver:  Coarse hepatic echotexture with increased parenchymal echogenicity, likely reflecting hepatic steatosis. Pneumobilia.  IMPRESSION: Status post cholecystectomy.  Common duct measures 14 mm, unchanged from CT.  Pneumobilia.  Suspected hepatic steatosis.   Electronically Signed   By: Julian Hy M.D.   On: 05/29/2014 10:04   Medications: I have reviewed the patient's current medications.  Assessment/Plan: Biliary sepsis-ERCP planned for today.  LOS: 2 days   Avanna Sowder 05/30/2014, 7:37 AM

## 2014-05-30 NOTE — Progress Notes (Signed)
[facility] PROGRESS NOTE  [name] ONG:[id number] DOB: 12-Mar-1927 DOA: 05/28/2014 PCP: Stephens Shire, MD  Assessment/Plan: 80 y/o male with PMH of HTN, DM, CAD, s/p PPM, COPD, dementia, h/o biliary stones s/p perc drainage and removal presents with several episodes of vomiting and some abdominal pain  1. Sepsis suspected biliary source; hemodynamically stable;  -Blood cultures: 1/2 +GNR;  -clinically improving on IV atx, IVF, cont antiemetics; IVF as needed; GI following    2. Suspected cholangitis, fever+abd pain, hyper bili/alk phosphatase/alt/ast; -CT abd: persistent dilated CBD s/p  removal of percutaneous biliary stents post  biliary sphincterotomy -labs improved, clinically better; cont IV atx, GI following for eval ERCP vs IR stent 2. COPD, no wheezing on exam, CXR: no clear pneumonia;  -cont bronchodilators as needed  3. HTN, Hold diuretic while on IVF, sepsis; BP is soft, resume BB 4. CAD history; no active chest pain; cont monitoring  5. H/o DM not on meds; cont ISS:   TF to [facility] on 9/6  Code Status: DNR Family Communication: d/w patient, called d/w Buch,Lois Spouse [phone]  (indicate person spoken with, relationship, and if by phone, the number) Disposition Plan: pend clinical improvement    Consultants:  GI  Procedures:  None   Antibiotics:  Aztreonam 9/5<<<<  vanc 9/5<<<<  levoflox 9/5<<<<   (indicate start date, and stop date if known)  HPI/Subjective: Alert, but confused at baseline   Objective: Filed Vitals:   05/30/14 0725  BP: 136/97  Pulse: 102  Temp: 98 F (36.7 C)  Resp: 15    Intake/Output Summary (Last 24 hours) at 05/30/14 0915 Last data filed at 05/30/14 0900  Gross per 24 hour  Intake 3451.67 ml  Output   1475 ml  Net 1976.67 ml   Filed Weights   05/28/14 1649 05/28/14 2033  Weight: 89.16 kg (196 lb 9 oz) 93.6 kg (206 lb 5.6 oz)    Exam:   General:  Alert, but confused at baseline    Cardiovascular: s1,s2 rrr  Respiratory: few crackles in LL  Abdomen: soft, distended, mild RUQ tender  Musculoskeletal: no LE edema   Data Reviewed: Basic Metabolic Panel:  Recent Labs Lab 05/28/14 1400 05/29/14 0248 05/30/14 0331  NA 138 139 137  K 4.3 4.3 3.9  CL 97 101 101  CO2 $Re'22 27 25  'LgE$ GLUCOSE 326* 166* 150*  BUN $Re'13 15 14  'wjm$ CREATININE 1.10 1.28 1.15  CALCIUM 9.5 8.4 8.4   Liver Function Tests:  Recent Labs Lab 05/28/14 1400 05/29/14 0248 05/30/14 0331  AST 580* 476* 182*  ALT 179* 239* 163*  ALKPHOS 361* 258* 234*  BILITOT 1.9* 2.6* 1.1  PROT 6.7 5.2* 5.4*  ALBUMIN 3.0* 2.3* 2.3*    Recent Labs Lab 05/28/14 1504  LIPASE 27   No results found for this basename: AMMONIA,  in the last 168 hours CBC:  Recent Labs Lab 05/28/14 1400 05/29/14 0248 05/30/14 0331  WBC 11.7* 14.5* 7.8  NEUTROABS 11.0*  --   --   HGB 13.8 11.5* 11.9*  HCT 41.8 35.0* 36.3*  MCV 93.9 95.4 95.0  PLT 163 162 164   Cardiac Enzymes:  Recent Labs Lab 05/28/14 1400  TROPONINI <0.30   BNP (last 3 results)  Recent Labs  05/28/14 1400  PROBNP 434.4   CBG:  Recent Labs Lab 05/29/14 0825 05/29/14 1126 05/29/14 1537 05/29/14 2139 05/30/14 0748  GLUCAP 166* 159* 130* 185* 177*    Recent Results (from the past 240 hour(s))  CULTURE,  BLOOD (ROUTINE X 2)     Status: None   Collection Time    05/28/14  2:00 PM      Result Value Ref Range Status   Specimen Description BLOOD LEFT FA   Final   Special Requests BOTTLES DRAWN AEROBIC AND ANAEROBIC [facility] EACH   Final   Culture  Setup Time     Final   Value: 05/28/2014 18:40     Performed at [facility]   Culture     Final   Value:        BLOOD CULTURE RECEIVED NO GROWTH TO DATE CULTURE WILL BE HELD FOR 5 DAYS BEFORE ISSUING A FINAL NEGATIVE REPORT     Performed at [facility]   Report Status PENDING   Incomplete  CULTURE, BLOOD (ROUTINE X 2)     Status: None   Collection Time    05/28/14  2:15  PM      Result Value Ref Range Status   Specimen Description BLOOD LEFT WRIST   Final   Special Requests BOTTLES DRAWN AEROBIC AND ANAEROBIC [facility] EACH   Final   Culture  Setup Time     Final   Value: 05/28/2014 18:40     Performed at [facility]   Culture     Final   Value: GRAM NEGATIVE RODS     Note: Gram Stain Report Called to,Read Back By and Verified With: CAROL SCHILLER 05/29/14 @ 345PM BY RUSCOE A.     Performed at [facility]   Report Status PENDING   Incomplete  URINE CULTURE     Status: None   Collection Time    05/28/14  3:46 PM      Result Value Ref Range Status   Specimen Description URINE, CATHETERIZED   Final   Special Requests NONE   Final   Culture  Setup Time     Final   Value: 05/28/2014 18:43     Performed at [place]     Final   Value: NO GROWTH     Performed at [facility]   Culture     Final   Value: NO GROWTH     Performed at [facility]   Report Status 05/29/2014 FINAL   Final  MRSA PCR SCREENING     Status: None   Collection Time    05/28/14  8:42 PM      Result Value Ref Range Status   MRSA by PCR NEGATIVE  NEGATIVE Final   Comment:            The GeneXpert MRSA Assay (FDA     approved for NASAL specimens     only), is one component of a     comprehensive MRSA colonization     surveillance program. It is not     intended to diagnose MRSA     infection nor to guide or     monitor treatment for     MRSA infections.  CULTURE, EXPECTORATED SPUTUM-ASSESSMENT     Status: None   Collection Time    05/30/14  7:33 AM      Result Value Ref Range Status   Specimen Description SPUTUM   Final   Special Requests NONE   Final   Sputum evaluation     Final   Value: MICROSCOPIC FINDINGS SUGGEST THAT THIS SPECIMEN IS NOT REPRESENTATIVE OF LOWER RESPIRATORY SECRETIONS. PLEASE RECOLLECT.     CALLED  TO CAROL SCHILLER AT 0848 BY WOOLLENK   Report Status 05/30/2014 FINAL   Final     Studies: Ct  Abdomen Pelvis W Contrast  05/28/2014   CLINICAL DATA:  Shortness of breath and upper abdominal pain after mowing lawn earlier this morning.  EXAM: CT ABDOMEN AND PELVIS WITH CONTRAST  TECHNIQUE: Multidetector CT imaging of the abdomen and pelvis was performed using the standard protocol following bolus administration of intravenous contrast.  CONTRAST:  81mL OMNIPAQUE IOHEXOL 300 MG/ML SOLN, OMNIPAQUE IOHEXOL 300 MG/ML SOLN  COMPARISON:  CT the abdomen pelvis - 04/19/2011; CT of the chest, abdomen pelvis - 08/26/2009; fluoroscopic guided biliary stent placement - 05/25/2011  FINDINGS: Normal hepatic contour. No discrete hepatic lesions. There has been removal of previously placed percutaneous biliary stent. Pneumobilia is seen within the persistently dilated common bile duct as well as the nondependent portions of the biliary tree of the left lobe of the liver. Again, the common bile duct appears enlarged measuring approximately 1.7 cm and maximal coronal dimension (image 51, series 5), similar to the 2010 examination. Post cholecystectomy. No ascites.  There is symmetric enhancement and excretion of the bilateral kidneys. Bilateral sub cm hypo attenuating renal lesions too small to actually characterize of favored to represent renal cysts. No definite renal stones this postcontrast examination. There is a minimal amount of grossly symmetric age and body habitus related perinephric stranding. No urinary obstruction. Normal appearance of the bilateral adrenal glands, pancreas and spleen.  Ingested enteric contrast extends to the level of the mid/distal small bowel. No evidence of enteric obstruction. Normal appearance of the appendix. Moderate colonic stool burden without evidence of obstruction. Scattered minimal colonic diverticulosis without evidence of diverticulitis. No pneumoperitoneum, pneumatosis or portal venous gas.  Scattered mixed calcified and noncalcified atherosclerotic plaque throughout the  abdominal aorta. There is a minimal amounts of bilobed ectasia involving the infrarenal abdominal aorta with dominant cranial component measuring approximately 2.9 cm in maximal coronal diameter (coronal image 58, series 5), grossly unchanged since the 2010 examination. The major branch vessels of the abdominal aorta appear patent on this non CTA examination.  No retroperitoneal, mesenteric, pelvic or inguinal lymphadenopathy.  Prostatic calcifications. Normal appearance of the urinary bladder given degree distention. No free fluid within the pelvis.  Limited visualization of the lower thorax demonstrates an approximately 4 mm nodule within the right lower lobe (image 4, series 4) which is unchanged since the 2010 examination and thus of benign etiology. There is minimal subsegmental atelectasis within the imaged caudal aspects of the right middle lobe and lingula. There is minimal dependent subsegmental atelectasis within the bilateral lower lobes. No discrete focal airspace opacities. No pleural effusion.  Cardiomegaly. Unchanged trace amount of pericardial fluid. Pacemaker leads are seen within the right atrium and ventricle. Coronary artery calcifications. Calcifications with the aortic valve leaflets are suspected though incompletely evaluated.  No acute or aggressive osseus abnormalities. Moderate multilevel lumbar spine DDD, worse at L3-L4 and L4-L5 with disc space height loss, endplate irregularity and sclerosis. Mild degenerative change of the bilateral hips, right greater than left.  Moderate-sized left-sided mixed direct and indirect mesenteric fat containing inguinal hernia. Regional soft tissues appear otherwise normal.  IMPRESSION: 1. No definite explanation for patient's upper abdominal pain and shortness of breath. 2. Post removal of percutaneous biliary stents with persistent dilatation of the common bile duct measuring approximately 1.7 cm in diameter, similar to prior examinations. Expected  pneumobilia post biliary sphincterotomy. 3. Minimal colonic diverticulosis without evidence of  diverticulitis. 4. Coronary artery calcifications. 5. Cardiomegaly. Small amount of pericardial fluid, unchanged since the 2010 examination and less presumably physiologic in etiology. 6. Unchanged ectasia of the infrarenal abdominal aorta measuring approximately 2.9 cm in diameter, stable since the 2010 examination. Recommend follow up by [place] in 5 years. This recommendation follows ACR consensus guidelines: White Paper of the ACR Incidental Findings Committee II on Vascular Findings. J Am Coll Radiol 2013; 10:789-794.   Electronically Signed   By: Sandi Mariscal M.D.   On: 05/28/2014 16:12   Dg Chest Portable 1 View  05/28/2014   CLINICAL DATA:  Shortness of breath and left-sided chest pain following exertion; history of coronary artery disease and diabetes  EXAM: PORTABLE CHEST - 1 VIEW  COMPARISON:  Portable chest x-ray dated October 29, 2012  FINDINGS: The lungs are well-expanded. The interstitial markings are coarse. The cardiopericardial silhouette is enlarged. The pulmonary vascularity is mildly prominent. The permanent pacemaker is unchanged in position. The bony thorax exhibits no acute abnormalities.  IMPRESSION: COPD with low-grade CHF. There is no focal pneumonia nor alveolar edema.   Electronically Signed   By: David  Martinique   On: 05/28/2014 14:16   US Abdomen Limited Ruq  05/29/2014   CLINICAL DATA:  Abdominal pain/ vomiting, status post cholecystectomy  EXAM: US ABDOMEN LIMITED - RIGHT UPPER QUADRANT  COMPARISON:  CT abdomen pelvis dated 05/28/2014  FINDINGS: Gallbladder:  Surgically absent.  Common bile duct:  Diameter: 14 mm.  Liver:  Coarse hepatic echotexture with increased parenchymal echogenicity, likely reflecting hepatic steatosis. Pneumobilia.  IMPRESSION: Status post cholecystectomy.  Common duct measures 14 mm, unchanged from CT.  Pneumobilia.  Suspected hepatic steatosis.   Electronically Signed    By: Julian Hy M.D.   On: 05/29/2014 10:04    Scheduled Meds: . antiseptic oral rinse  7 mL Mouth Rinse BID  . aspirin  162 mg Oral Daily  . aztreonam  2 g Intravenous Q8H  . budesonide-formoterol  1 puff Inhalation BID  . donepezil  10 mg Oral QHS  . folic acid  1 mg Oral Daily  . heparin  5,000 Units Subcutaneous 3 times per day  . insulin aspart  0-15 Units Subcutaneous TID WC  . levofloxacin (LEVAQUIN) IV  750 mg Intravenous Q48H  . Memantine HCl ER  28 mg Oral Daily  . pantoprazole  20 mg Oral BID  . polyethylene glycol  17 g Oral Daily  . simvastatin  20 mg Oral QPM  . sodium chloride  3 mL Intravenous Q12H  . tiotropium  18 mcg Inhalation Daily  . vancomycin  750 mg Intravenous Q12H   Continuous Infusions: . sodium chloride 100 mL/hr at 05/30/14 7782    Principal Problem:   Sepsis Active Problems:   DM2 (diabetes mellitus, type 2)   Hypertension   Systolic CHF, chronic   Dementia with behavioral disturbance    Time spent: >35 minutes     Kinnie Feil  [facility] Pager [phone]. If 7PM-7AM, please contact night-coverage at [email or web address], password [email or web address]  [facility] 05/30/2014, 9:15 AM  LOS: 2 days

## 2014-05-31 LAB — CULTURE, BLOOD (ROUTINE X 2)

## 2014-05-31 LAB — GLUCOSE, CAPILLARY
GLUCOSE-CAPILLARY: 117 mg/dL — AB (ref 70–99)
Glucose-Capillary: 182 mg/dL — ABNORMAL HIGH (ref 70–99)
Glucose-Capillary: 128 mg/dL — ABNORMAL HIGH (ref 70–99)
Glucose-Capillary: 117 mg/dL — ABNORMAL HIGH (ref 70–99)

## 2014-05-31 MED ORDER — SODIUM CHLORIDE 0.9 % IV SOLN
500.0000 mg | Freq: Three times a day (TID) | INTRAVENOUS | Status: DC
  Administered 2014-05-31 – 2014-06-04 (×12): 500 mg via INTRAVENOUS
  Filled 2014-05-31 (×14): qty 500

## 2014-05-31 MED ORDER — METOPROLOL TARTRATE 50 MG PO TABS
50.0000 mg | ORAL_TABLET | Freq: Two times a day (BID) | ORAL | Status: DC
  Administered 2014-05-31 – 2014-06-05 (×11): 50 mg via ORAL
  Filled 2014-05-31 (×12): qty 1

## 2014-05-31 NOTE — Progress Notes (Signed)
Patient's wife in to visit.  She was updated as to his condition and today's events. Patient moved back to his room but remains in his recliner.

## 2014-05-31 NOTE — Progress Notes (Signed)
Cross cover [facility] Subjective: Since I last evaluated the patient, he seems to be doing well from a GI standpoint. He has beed aferile and denies having any abdominal pain, nausea or vomiting. He however has been "talking out of his head" says his wife. She claims this happens to him often and at times he is "clear".   Objective: Vital signs in last 24 hours: Temp:  [98 F (36.7 C)-98.2 F (36.8 C)] 98.1 F (36.7 C) (09/07 0700) Pulse Rate:  [88-117] 88 (09/07 0413) Resp:  [16-23] 18 (09/07 0413) BP: (128-162)/(69-91) 148/79 mmHg (09/07 0413) SpO2:  [90 %-99 %] 97 % (09/07 0824)    Intake/Output from previous day: 09/06 0701 - 09/07 0700 In: 3178.3 [I.V.:2928.3; IV Piggyback:250] Out: 2200 [Urine:2200] Intake/Output this shift:   General appearance: cooperative, appears stated age, delirious and no distress Resp: clear to auscultation bilaterally Cardio: regular rate and rhythm, S1, S2 normal, no murmur, click, rub or gallop GI: soft, morbidly obese, non-tender; bowel sounds normal; no masses,  no organomegaly  Lab Results:  Recent Labs  05/28/14 1400 05/29/14 0248 05/30/14 0331  WBC 11.7* 14.5* 7.8  HGB 13.8 11.5* 11.9*  HCT 41.8 35.0* 36.3*  PLT 163 162 164   BMET  Recent Labs  05/28/14 1400 05/29/14 0248 05/30/14 0331  NA 138 139 137  K 4.3 4.3 3.9  CL 97 101 101  CO2 22 27 25   GLUCOSE 326* 166* 150*  BUN 13 15 14   CREATININE 1.10 1.28 1.15  CALCIUM 9.5 8.4 8.4   LFT  Recent Labs  05/30/14 0331  PROT 5.4*  ALBUMIN 2.3*  AST 182*  ALT 163*  ALKPHOS 234*  BILITOT 1.1   Studies/Results: Dg Ercp Biliary & Pancreatic Ducts  05/30/2014   CLINICAL DATA:  Right-sided abdominal pain.  Sepsis.  Cholangitis.  EXAM: ERCP  TECHNIQUE: Multiple spot images obtained with the fluoroscopic device and submitted for interpretation post-procedure.  COMPARISON:  CT abdomen pelvis -05/28/2014  FINDINGS: Thirteen spot intraoperative fluoroscopic images during ERCP are  provided for review.  Initial image demonstrates an ERCP probe overlying the right upper abdominal quadrant. Surgical clips overlie the expected location of the gallbladder fossa.  Subsequent images demonstrate selective cannulation and opacification of the common bile duct. The common bile duct appears markedly distended. There are failed persistent filling defects within distal aspect of the CBD (representative image 8).  Completion image demonstrates placement of plastic internal biliary stent overlying the distal CBD with subsequent decompression of the more central common bile duct.  There is very minimal opacification of the central aspect of the intrahepatic biliary system which appears nondilated. There is no definitive opacification of the pancreatic or residual cystic duct.  IMPRESSION: ERCP with findings of persistent ill-defined filling defects within a markedly dilated common bile duct worrisome for choledocholithiasis and/or gallbladder sludge. Post biliary stent placement.  These images were submitted for radiologic interpretation only. Please see the procedural report for the amount of contrast and the fluoroscopy time utilized.   Electronically Signed   By: Sandi Mariscal M.D.   On: 05/30/2014 12:38   US Abdomen Limited Ruq  05/29/2014   CLINICAL DATA:  Abdominal pain/ vomiting, status post cholecystectomy  EXAM: US ABDOMEN LIMITED - RIGHT UPPER QUADRANT  COMPARISON:  CT abdomen pelvis dated 05/28/2014  FINDINGS: Gallbladder:  Surgically absent.  Common bile duct:  Diameter: 14 mm.  Liver:  Coarse hepatic echotexture with increased parenchymal echogenicity, likely reflecting hepatic steatosis. Pneumobilia.  IMPRESSION: Status post  cholecystectomy.  Common duct measures 14 mm, unchanged from CT.  Pneumobilia.  Suspected hepatic steatosis.   Electronically Signed   By: Julian Hy M.D.   On: 05/29/2014 10:04   Medications: I have reviewed the patient's current  medications.  Assessment/Plan: 1) Biliary sepsis/Cholangitis-abnormal CT scan: improving after ERCP with stent placed in the CBD-sludge and stones noted on return after stent was inserted. Technically difficult procedure as the ampulla was in a diverticulum. Continue present care. LFT's improving. 2) Delirium superimposed on baseline dementia.  3) GERD. 4) COPD.  5) CAD/HTN/Hyperlipidemia. 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  7) Morbid obesity   LOS: 3 days   Sonni Barse 05/31/2014, 8:56 AM

## 2014-05-31 NOTE — Progress Notes (Signed)
ANTIBIOTIC CONSULT NOTE   Pharmacy Consult for Primaxin Indication: rule out sepsis  Allergies  Allergen Reactions  . Doxycycline Other (See Comments)    Unknown- patient doesn't remember  . Penicillins Rash    Patient Measurements: Height: 5\' 8"  (172.7 cm) Weight: 206 lb 5.6 oz (93.6 kg) IBW/kg (Calculated) : 68.4 Actual Body Weight: 97.1 kg Ideal Body Weight: 66 kg Adjusted Body Weight: 78.5 kg  Vital Signs: Temp: 98.1 F (36.7 C) (09/07 0700) Temp src: Oral (09/07 0700) BP: 148/79 mmHg (09/07 0413) Pulse Rate: 88 (09/07 0413) Intake/Output from previous day: 09/06 0701 - 09/07 0700 In: 3178.3 [I.V.:2928.3; IV Piggyback:250] Out: 2200 [Urine:2200] Intake/Output from this shift:    Labs:  Recent Labs  05/28/14 1400 05/29/14 0248 05/30/14 0331  WBC 11.7* 14.5* 7.8  HGB 13.8 11.5* 11.9*  PLT 163 162 164  CREATININE 1.10 1.28 1.15   Estimated Creatinine Clearance: 53.1 ml/min (by C-G formula based on Cr of 1.15). No results found for this basename: VANCOTROUGH, VANCOPEAK, VANCORANDOM, Sunrise, GENTPEAK, GENTRANDOM, TOBRATROUGH, TOBRAPEAK, TOBRARND, AMIKACINPEAK, AMIKACINTROU, AMIKACIN,  in the last 72 hours   Microbiology: Recent Results (from the past 720 hour(s))  CULTURE, BLOOD (ROUTINE X 2)     Status: None   Collection Time    05/28/14  2:00 PM      Result Value Ref Range Status   Specimen Description BLOOD LEFT FA   Final   Special Requests BOTTLES DRAWN AEROBIC AND ANAEROBIC [facility] EACH   Final   Culture  Setup Time     Final   Value: 05/28/2014 18:40     Performed at [facility]   Culture     Final   Value:        BLOOD CULTURE RECEIVED NO GROWTH TO DATE CULTURE WILL BE HELD FOR 5 DAYS BEFORE ISSUING A FINAL NEGATIVE REPORT     Performed at [facility]   Report Status PENDING   Incomplete  CULTURE, BLOOD (ROUTINE X 2)     Status: None   Collection Time    05/28/14  2:15 PM      Result Value Ref Range Status   Specimen  Description BLOOD LEFT WRIST   Final   Special Requests BOTTLES DRAWN AEROBIC AND ANAEROBIC [facility] EACH   Final   Culture  Setup Time     Final   Value: 05/28/2014 18:40     Performed at [facility]   Culture     Final   Value: ESCHERICHIA COLI     Note: Gram Stain Report Called to,Read Back By and Verified With: CAROL SCHILLER 05/29/14 @ 345PM BY RUSCOE A.     Performed at [facility]   Report Status 05/31/2014 FINAL   Final   Organism ID, Bacteria ESCHERICHIA COLI   Final  URINE CULTURE     Status: None   Collection Time    05/28/14  3:46 PM      Result Value Ref Range Status   Specimen Description URINE, CATHETERIZED   Final   Special Requests NONE   Final   Culture  Setup Time     Final   Value: 05/28/2014 18:43     Performed at [place]     Final   Value: NO GROWTH     Performed at [facility]   Culture     Final   Value: NO GROWTH     Performed at [facility]   Report Status 05/29/2014 FINAL   Final  MRSA PCR SCREENING     Status: None   Collection Time    05/28/14  8:42 PM      Result Value Ref Range Status   MRSA by PCR NEGATIVE  NEGATIVE Final   Comment:            The GeneXpert MRSA Assay (FDA     approved for NASAL specimens     only), is one component of a     comprehensive MRSA colonization     surveillance program. It is not     intended to diagnose MRSA     infection nor to guide or     monitor treatment for     MRSA infections.  CULTURE, EXPECTORATED SPUTUM-ASSESSMENT     Status: None   Collection Time    05/30/14  7:33 AM      Result Value Ref Range Status   Specimen Description SPUTUM   Final   Special Requests NONE   Final   Sputum evaluation     Final   Value: MICROSCOPIC FINDINGS SUGGEST THAT THIS SPECIMEN IS NOT REPRESENTATIVE OF LOWER RESPIRATORY SECRETIONS. PLEASE RECOLLECT.     CALLED TO CAROL SCHILLER AT 0848 BY [facility]   Report Status 05/30/2014 FINAL   Final   Assessment: 80yo  male presented to [facility] with SOB. Pt is currently afebrile, WBC now down to 7.8, sCr 1.1. Started on empiric vancomycin, levaquin, and aztreonam for sepsis rule-out abx now changed to primaxin for ecoli bacteremia  9/6 resp cx - recollect 9/4 bld x2 - 1/2 GNR>>ecoli - sensitive to cefepime, ceftazidime, ceftriaxone, primaxin 9/4 urine - ng  Goal of Therapy:  Vancomycin trough level 15-20 mcg/ml  Plan:  Discontinue vancomycin, aztreonam, and levaquin Primaxin 500mg  IV q8 hours Consider de-escalating to ceftriaxone based on sensitivities  Erin Hearing PharmD., BCPS Clinical Pharmacist Pager [phone] 05/31/2014 9:06 AM

## 2014-05-31 NOTE — Progress Notes (Signed)
Report called to Ebony Hail, RN on [facility]

## 2014-05-31 NOTE — Progress Notes (Signed)
[facility] PROGRESS NOTE  [name] QQV:[id number] DOB: 02/07/1927 DOA: 05/28/2014 PCP: Stephens Shire, MD  Assessment/Plan: 80 y/o male with PMH of HTN, DM, CAD, s/p PPM, COPD, dementia, h/o biliary stones s/p perc drainage and removal presents with several episodes of vomiting and some abdominal pain  1. Sepsis suspected biliary source; hemodynamically stable;  -Blood cultures: 1/2 +GNR, E Coli; clinically improving on IV atx, IVF, antiemetics;  -atx changed to Imipenem based on sensitivity on 9/7   2. Suspected cholangitis, fever+abd pain, hyper bili/alk phosphatase/alt/ast; -CT abd: persistent dilated CBD s/p  removal of percutaneous biliary stents post  biliary sphincterotomy -9/6: s/p ERCP with sphincterotomy/papillotomy and ERCP with biliary stent placement -labs improved, clinically better; cont IV atx, GI following   2. COPD, no wheezing on exam, CXR: no clear pneumonia;  -cont bronchodilators as needed  3. HTN, Hold diuretic while on IVF, sepsis; BP is soft, resume BB 4. CAD history; no active chest pain; cont monitoring  5. H/o DM not on meds; cont ISS:  6. Delirium in the context of sepsis, dementia -cont treat infection; supportive care   TF to [facility] on 9/7   1. There was a large periampullary diverticulum in in the distal  part of D2 which completely surrounded the ampulla. Ampulla was  difficult to locate. It was located at the deepest point in the  diverticulum. CBD was freely cannulated with a catheter and then  with a guidewire. Positioning was difficult with the large  periampullary diverticulum in the distal part of D2. PD was not  cannulated or injected by intention.  2. Three large filling defects were seen in the distal common bile  duct-appeared to be sludge and stones.  3. There was dilation of the common bile duct measuring 17 mm down  to the level of the ampulla. Intrahepatic ducts were not dilated.  4. With guidewire in the bile duct, a  small biliary sphincterotomy  was performed using the sphincterotome. I was unable to safely  extend the sphincterotomy to an adequate size due to the  diverticulum. The sphincterotomy was not large enough for balloon  extraction of the filling defects and positioning was challenging.  5. Under endoscopic and fluoroscopic guidance a 10 Fr x 7 cm double  flap stent was placed in the bile duct. Sludge, bile and contast  slowly drained from the stent. The stent was in very good position  both by fluroscopy and endoscopy.  The scope was then completely withdrawn from the patient and the  procedure terminated.  COMPLICATIONS:. There were no complications.  ENDOSCOPIC IMPRESSION:  1. Large periampullary diverticulum  2. Filling defects in the distal common bile duct  3. Dilation of the common bile duct  4. Small biliary sphincterotomy performed  5. 10 Fr x 7 cm biliary stent placed  RECOMMENDATIONS:  1. IV antibiotics  2. trend liver enzymes    Code Status: DNR Family Communication: d/w patient, recently called d/w Lax,Lois Spouse [phone]  (indicate person spoken with, relationship, and if by phone, the number) Disposition Plan: pend clinical improvement    Consultants:  GI  Procedures:  None   Antibiotics:  Aztreonam 9/5<<<<9/7  vanc 9/5<<<<9/7  levoflox 9/5<<<<9/7  Imipenem 9/7<<<<     (indicate start date, and stop date if known)  HPI/Subjective: Alert, but confused at baseline   Objective: Filed Vitals:   05/31/14 0413  BP: 148/79  Pulse: 88  Temp: 98 F (36.7 C)  Resp: 18    Intake/Output  Summary (Last 24 hours) at 05/31/14 0751 Last data filed at 05/31/14 0600  Gross per 24 hour  Intake 3178.33 ml  Output   1775 ml  Net 1403.33 ml   Filed Weights   05/28/14 1649 05/28/14 2033  Weight: 89.16 kg (196 lb 9 oz) 93.6 kg (206 lb 5.6 oz)    Exam:   General:  Alert, but confused at baseline   Cardiovascular: s1,s2 rrr  Respiratory:  few crackles in LL  Abdomen: soft, distended, mild RUQ tender  Musculoskeletal: no LE edema   Data Reviewed: Basic Metabolic Panel:  Recent Labs Lab 05/28/14 1400 05/29/14 0248 05/30/14 0331  NA 138 139 137  K 4.3 4.3 3.9  CL 97 101 101  CO2 $Re'22 27 25  'XeP$ GLUCOSE 326* 166* 150*  BUN $Re'13 15 14  'hBt$ CREATININE 1.10 1.28 1.15  CALCIUM 9.5 8.4 8.4   Liver Function Tests:  Recent Labs Lab 05/28/14 1400 05/29/14 0248 05/30/14 0331  AST 580* 476* 182*  ALT 179* 239* 163*  ALKPHOS 361* 258* 234*  BILITOT 1.9* 2.6* 1.1  PROT 6.7 5.2* 5.4*  ALBUMIN 3.0* 2.3* 2.3*    Recent Labs Lab 05/28/14 1504  LIPASE 27   No results found for this basename: AMMONIA,  in the last 168 hours CBC:  Recent Labs Lab 05/28/14 1400 05/29/14 0248 05/30/14 0331  WBC 11.7* 14.5* 7.8  NEUTROABS 11.0*  --   --   HGB 13.8 11.5* 11.9*  HCT 41.8 35.0* 36.3*  MCV 93.9 95.4 95.0  PLT 163 162 164   Cardiac Enzymes:  Recent Labs Lab 05/28/14 1400  TROPONINI <0.30   BNP (last 3 results)  Recent Labs  05/28/14 1400  PROBNP 434.4   CBG:  Recent Labs Lab 05/29/14 1537 05/29/14 2139 05/30/14 0748 05/30/14 1545 05/30/14 2132  GLUCAP 130* 185* 177* 135* 134*    Recent Results (from the past 240 hour(s))  CULTURE, BLOOD (ROUTINE X 2)     Status: None   Collection Time    05/28/14  2:00 PM      Result Value Ref Range Status   Specimen Description BLOOD LEFT FA   Final   Special Requests BOTTLES DRAWN AEROBIC AND ANAEROBIC [facility] EACH   Final   Culture  Setup Time     Final   Value: 05/28/2014 18:40     Performed at [facility]   Culture     Final   Value:        BLOOD CULTURE RECEIVED NO GROWTH TO DATE CULTURE WILL BE HELD FOR 5 DAYS BEFORE ISSUING A FINAL NEGATIVE REPORT     Performed at [facility]   Report Status PENDING   Incomplete  CULTURE, BLOOD (ROUTINE X 2)     Status: None   Collection Time    05/28/14  2:15 PM      Result Value Ref Range Status    Specimen Description BLOOD LEFT WRIST   Final   Special Requests BOTTLES DRAWN AEROBIC AND ANAEROBIC [facility] EACH   Final   Culture  Setup Time     Final   Value: 05/28/2014 18:40     Performed at [facility]   Culture     Final   Value: ESCHERICHIA COLI     Note: Gram Stain Report Called to,Read Back By and Verified With: CAROL SCHILLER 05/29/14 @ 345PM BY RUSCOE A.     Performed at [facility]   Report Status 05/31/2014 FINAL  Final   Organism ID, Bacteria ESCHERICHIA COLI   Final  URINE CULTURE     Status: None   Collection Time    05/28/14  3:46 PM      Result Value Ref Range Status   Specimen Description URINE, CATHETERIZED   Final   Special Requests NONE   Final   Culture  Setup Time     Final   Value: 05/28/2014 18:43     Performed at [place]     Final   Value: NO GROWTH     Performed at [facility]   Culture     Final   Value: NO GROWTH     Performed at [facility]   Report Status 05/29/2014 FINAL   Final  MRSA PCR SCREENING     Status: None   Collection Time    05/28/14  8:42 PM      Result Value Ref Range Status   MRSA by PCR NEGATIVE  NEGATIVE Final   Comment:            The GeneXpert MRSA Assay (FDA     approved for NASAL specimens     only), is one component of a     comprehensive MRSA colonization     surveillance program. It is not     intended to diagnose MRSA     infection nor to guide or     monitor treatment for     MRSA infections.  CULTURE, EXPECTORATED SPUTUM-ASSESSMENT     Status: None   Collection Time    05/30/14  7:33 AM      Result Value Ref Range Status   Specimen Description SPUTUM   Final   Special Requests NONE   Final   Sputum evaluation     Final   Value: MICROSCOPIC FINDINGS SUGGEST THAT THIS SPECIMEN IS NOT REPRESENTATIVE OF LOWER RESPIRATORY SECRETIONS. PLEASE RECOLLECT.     CALLED TO CAROL SCHILLER AT 0848 BY [facility]   Report Status 05/30/2014 FINAL   Final      Studies: Dg Ercp Biliary & Pancreatic Ducts  05/30/2014   CLINICAL DATA:  Right-sided abdominal pain.  Sepsis.  Cholangitis.  EXAM: ERCP  TECHNIQUE: Multiple spot images obtained with the fluoroscopic device and submitted for interpretation post-procedure.  COMPARISON:  CT abdomen pelvis -05/28/2014  FINDINGS: Thirteen spot intraoperative fluoroscopic images during ERCP are provided for review.  Initial image demonstrates an ERCP probe overlying the right upper abdominal quadrant. Surgical clips overlie the expected location of the gallbladder fossa.  Subsequent images demonstrate selective cannulation and opacification of the common bile duct. The common bile duct appears markedly distended. There are failed persistent filling defects within distal aspect of the CBD (representative image 8).  Completion image demonstrates placement of plastic internal biliary stent overlying the distal CBD with subsequent decompression of the more central common bile duct.  There is very minimal opacification of the central aspect of the intrahepatic biliary system which appears nondilated. There is no definitive opacification of the pancreatic or residual cystic duct.  IMPRESSION: ERCP with findings of persistent ill-defined filling defects within a markedly dilated common bile duct worrisome for choledocholithiasis and/or gallbladder sludge. Post biliary stent placement.  These images were submitted for radiologic interpretation only. Please see the procedural report for the amount of contrast and the fluoroscopy time utilized.   Electronically Signed   By: Sandi Mariscal M.D.   On: 05/30/2014 12:38  US Abdomen Limited Ruq  05/29/2014   CLINICAL DATA:  Abdominal pain/ vomiting, status post cholecystectomy  EXAM: US ABDOMEN LIMITED - RIGHT UPPER QUADRANT  COMPARISON:  CT abdomen pelvis dated 05/28/2014  FINDINGS: Gallbladder:  Surgically absent.  Common bile duct:  Diameter: 14 mm.  Liver:  Coarse hepatic echotexture with  increased parenchymal echogenicity, likely reflecting hepatic steatosis. Pneumobilia.  IMPRESSION: Status post cholecystectomy.  Common duct measures 14 mm, unchanged from CT.  Pneumobilia.  Suspected hepatic steatosis.   Electronically Signed   By: Julian Hy M.D.   On: 05/29/2014 10:04    Scheduled Meds: . antiseptic oral rinse  7 mL Mouth Rinse BID  . aspirin  162 mg Oral Daily  . aztreonam  2 g Intravenous Q8H  . budesonide-formoterol  1 puff Inhalation BID  . donepezil  10 mg Oral QHS  . folic acid  1 mg Oral Daily  . heparin  5,000 Units Subcutaneous 3 times per day  . insulin aspart  0-15 Units Subcutaneous TID WC  . levofloxacin (LEVAQUIN) IV  750 mg Intravenous Q24H  . Memantine HCl ER  28 mg Oral Daily  . metoprolol  25 mg Oral BID  . pantoprazole  20 mg Oral BID  . polyethylene glycol  17 g Oral Daily  . simvastatin  20 mg Oral QPM  . sodium chloride  3 mL Intravenous Q12H  . tiotropium  18 mcg Inhalation Daily  . vancomycin  750 mg Intravenous Q12H   Continuous Infusions: . sodium chloride 100 mL/hr at 05/30/14 2000    Principal Problem:   Sepsis Active Problems:   DM2 (diabetes mellitus, type 2)   Hypertension   Systolic CHF, chronic   Dementia with behavioral disturbance   Acute cholangitis   Nonspecific abnormal results of liver function study   Nonspecific (abnormal) findings on radiological and other examination of biliary tract    Time spent: >35 minutes     Kinnie Feil  [facility] Pager [phone]. If 7PM-7AM, please contact night-coverage at [email or web address], password [facility] 05/31/2014, 7:51 AM  LOS: 3 days

## 2014-05-31 NOTE — Progress Notes (Signed)
Patient has become increasingly confused.  He has pulled out his IV and removed his external urinary catheter.  Patient states that he is going home.  He insists that the doctor has discharged him.  Safety sitters not available from staffing. Patient placed in a recliner at the nurses station for safety.

## 2014-05-31 NOTE — Progress Notes (Signed)
Pt alert, VSS, no c/o pain, all due meds given, personal belongings with wife at bedside. Pt transferred via wheelchair to [phone]. Medications also sent to [facility].

## 2014-06-01 ENCOUNTER — Encounter (HOSPITAL_COMMUNITY): Payer: Self-pay | Admitting: Gastroenterology

## 2014-06-01 LAB — GLUCOSE, CAPILLARY
GLUCOSE-CAPILLARY: 111 mg/dL — AB (ref 70–99)
Glucose-Capillary: 110 mg/dL — ABNORMAL HIGH (ref 70–99)
Glucose-Capillary: 117 mg/dL — ABNORMAL HIGH (ref 70–99)
Glucose-Capillary: 165 mg/dL — ABNORMAL HIGH (ref 70–99)

## 2014-06-01 NOTE — Progress Notes (Signed)
The patient is well-known to me, but I have not seen him for the past couple of years.  In 2012 I had evaluated him for recurrent pancreatitis and it was thought to be from a biliary source.  The ERCP was unsuccessful and ultimately IR placed a percutaneous drain with subsequent metallic stent placement, however, I do not see any metallic stent in the CBD.  Additionally, there appears to be CBD stones and in the past there was confusion about stones in the CBD.  I had performed an EUS with findings of stones and referred him to [facility].  They failed with the ERCP and performed an EUS with negative findings of stones.  Fortunately Dr. Fuller Plan was able to place a new stent.  Since I last evaluated him he has dementia.  In the past he was able to interact in a meaningful manner and appropriately answer all my questions.  Currently he only grunts.  The biliary stent allows for temporization of his symptoms and an attempt to place a new metallic stent can be pursued in the near future as an outpatient, however, I do not know how aggressive to be with his care with his comorbidities.  His wife reports that he had been well until this recent admission.

## 2014-06-01 NOTE — Care Management Note (Signed)
CARE MANAGEMENT NOTE 06/01/2014  Patient:  [name], [name]   Account Number:  [id number]  Date Initiated:  06/01/2014  Documentation initiated by:  Lelar Farewell  Subjective/Objective Assessment:   CM following for progession and d/c planning.     Action/Plan:   06/01/14 Met with pt and IM explained, left on bedside table.   Anticipated DC Date:     Anticipated DC Plan:  SKILLED NURSING FACILITY         Choice offered to / List presented to:             Status of service:   Medicare Important Message given?  YES (If response is "NO", the following Medicare IM given date fields will be blank) Date Medicare IM given:  06/01/2014 Medicare IM given by:  Isidro Monks Date Additional Medicare IM given:   Additional Medicare IM given by:    Discharge Disposition:    Per UR Regulation:    If discussed at Long Length of Stay Meetings, dates discussed:    Comments:

## 2014-06-01 NOTE — Progress Notes (Signed)
[facility] PROGRESS NOTE  [name] XBM:[id number] DOB: Feb 13, 1927 DOA: 05/28/2014 PCP: Stephens Shire, MD  Assessment/Plan: 80 y/o male with PMH of HTN, DM, CAD, s/p PPM, COPD, dementia, h/o biliary stones s/p perc drainage and removal presents with several episodes of vomiting and some abdominal pain  1. Sepsis suspected biliary source; hemodynamically stable;  -Blood cultures: 1/2 +GNR, E Coli; clinically improving on IV atx, IVF, antiemetics;  -atx changed to Imipenem based on sensitivity on 9/7;   2. Suspected cholangitis, fever+abd pain, hyper bili/alk phosphatase/alt/ast; -CT abd: persistent dilated CBD s/p  removal of percutaneous biliary stents post  biliary sphincterotomy -9/6: s/p ERCP with sphincterotomy/papillotomy and ERCP with biliary stent placement -labs improved, f/u in AM ; clinically better; cont IV atx, GI following   2. COPD, no wheezing on exam, CXR: no clear pneumonia;  -cont bronchodilators as needed  3. HTN, Hold diuretic with sepsis; BP is soft, resume BB 4. CAD history; no active chest pain; cont monitoring  5. H/o DM not on meds; cont ISS:  6. Delirium in the context of sepsis, dementia -cont treat infection; supportive care     1. There was a large periampullary diverticulum in in the distal  part of D2 which completely surrounded the ampulla. Ampulla was  difficult to locate. It was located at the deepest point in the  diverticulum. CBD was freely cannulated with a catheter and then  with a guidewire. Positioning was difficult with the large  periampullary diverticulum in the distal part of D2. PD was not  cannulated or injected by intention.  2. Three large filling defects were seen in the distal common bile  duct-appeared to be sludge and stones.  3. There was dilation of the common bile duct measuring 17 mm down  to the level of the ampulla. Intrahepatic ducts were not dilated.  4. With guidewire in the bile duct, a small biliary  sphincterotomy  was performed using the sphincterotome. I was unable to safely  extend the sphincterotomy to an adequate size due to the  diverticulum. The sphincterotomy was not large enough for balloon  extraction of the filling defects and positioning was challenging.  5. Under endoscopic and fluoroscopic guidance a 10 Fr x 7 cm double  flap stent was placed in the bile duct. Sludge, bile and contast  slowly drained from the stent. The stent was in very good position  both by fluroscopy and endoscopy.  The scope was then completely withdrawn from the patient and the  procedure terminated.  COMPLICATIONS:. There were no complications.  ENDOSCOPIC IMPRESSION:  1. Large periampullary diverticulum  2. Filling defects in the distal common bile duct  3. Dilation of the common bile duct  4. Small biliary sphincterotomy performed  5. 10 Fr x 7 cm biliary stent placed  RECOMMENDATIONS:  1. IV antibiotics  2. trend liver enzymes    Code Status: DNR Family Communication: d/w patient, called d/w Ostrom,Lois Spouse [phone]  (indicate person spoken with, relationship, and if by phone, the number) Disposition Plan: pend clinical improvement; 2-3 days    Consultants:  GI  Procedures:  None   Antibiotics:  Aztreonam 9/5<<<<9/7  vanc 9/5<<<<9/7  levoflox 9/5<<<<9/7  Imipenem 9/7<<<<     (indicate start date, and stop date if known)  HPI/Subjective: Alert, but confused at baseline   Objective: Filed Vitals:   06/01/14 0449  BP: 127/72  Pulse: 73  Temp: 98.4 F (36.9 C)  Resp: 18    Intake/Output Summary (Last  24 hours) at 06/01/14 0809 Last data filed at 06/01/14 0500  Gross per 24 hour  Intake 1161.67 ml  Output    600 ml  Net 561.67 ml   Filed Weights   05/28/14 1649 05/28/14 2033 05/31/14 2100  Weight: 89.16 kg (196 lb 9 oz) 93.6 kg (206 lb 5.6 oz) 91.1 kg (200 lb 13.4 oz)    Exam:   General:  Alert, but confused at baseline   Cardiovascular:  s1,s2 rrr  Respiratory: few crackles in LL  Abdomen: soft, distended, mild RUQ tender  Musculoskeletal: no LE edema   Data Reviewed: Basic Metabolic Panel:  Recent Labs Lab 05/28/14 1400 05/29/14 0248 05/30/14 0331  NA 138 139 137  K 4.3 4.3 3.9  CL 97 101 101  CO2 $Re'22 27 25  'HZh$ GLUCOSE 326* 166* 150*  BUN $Re'13 15 14  'RZH$ CREATININE 1.10 1.28 1.15  CALCIUM 9.5 8.4 8.4   Liver Function Tests:  Recent Labs Lab 05/28/14 1400 05/29/14 0248 05/30/14 0331  AST 580* 476* 182*  ALT 179* 239* 163*  ALKPHOS 361* 258* 234*  BILITOT 1.9* 2.6* 1.1  PROT 6.7 5.2* 5.4*  ALBUMIN 3.0* 2.3* 2.3*    Recent Labs Lab 05/28/14 1504  LIPASE 27   No results found for this basename: AMMONIA,  in the last 168 hours CBC:  Recent Labs Lab 05/28/14 1400 05/29/14 0248 05/30/14 0331  WBC 11.7* 14.5* 7.8  NEUTROABS 11.0*  --   --   HGB 13.8 11.5* 11.9*  HCT 41.8 35.0* 36.3*  MCV 93.9 95.4 95.0  PLT 163 162 164   Cardiac Enzymes:  Recent Labs Lab 05/28/14 1400  TROPONINI <0.30   BNP (last 3 results)  Recent Labs  05/28/14 1400  PROBNP 434.4   CBG:  Recent Labs Lab 05/31/14 0806 05/31/14 1306 05/31/14 1638 05/31/14 2057 06/01/14 0734  GLUCAP 182* 117* 128* 117* 110*    Recent Results (from the past 240 hour(s))  CULTURE, BLOOD (ROUTINE X 2)     Status: None   Collection Time    05/28/14  2:00 PM      Result Value Ref Range Status   Specimen Description BLOOD LEFT FA   Final   Special Requests BOTTLES DRAWN AEROBIC AND ANAEROBIC [facility] EACH   Final   Culture  Setup Time     Final   Value: 05/28/2014 18:40     Performed at [facility]   Culture     Final   Value:        BLOOD CULTURE RECEIVED NO GROWTH TO DATE CULTURE WILL BE HELD FOR 5 DAYS BEFORE ISSUING A FINAL NEGATIVE REPORT     Performed at [facility]   Report Status PENDING   Incomplete  CULTURE, BLOOD (ROUTINE X 2)     Status: None   Collection Time    05/28/14  2:15 PM      Result  Value Ref Range Status   Specimen Description BLOOD LEFT WRIST   Final   Special Requests BOTTLES DRAWN AEROBIC AND ANAEROBIC [facility] EACH   Final   Culture  Setup Time     Final   Value: 05/28/2014 18:40     Performed at [facility]   Culture     Final   Value: ESCHERICHIA COLI     Note: Gram Stain Report Called to,Read Back By and Verified With: CAROL SCHILLER 05/29/14 @ 345PM BY RUSCOE A.     Performed at [facility]  Report Status 05/31/2014 FINAL   Final   Organism ID, Bacteria ESCHERICHIA COLI   Final  URINE CULTURE     Status: None   Collection Time    05/28/14  3:46 PM      Result Value Ref Range Status   Specimen Description URINE, CATHETERIZED   Final   Special Requests NONE   Final   Culture  Setup Time     Final   Value: 05/28/2014 18:43     Performed at [place]     Final   Value: NO GROWTH     Performed at [facility]   Culture     Final   Value: NO GROWTH     Performed at [facility]   Report Status 05/29/2014 FINAL   Final  MRSA PCR SCREENING     Status: None   Collection Time    05/28/14  8:42 PM      Result Value Ref Range Status   MRSA by PCR NEGATIVE  NEGATIVE Final   Comment:            The GeneXpert MRSA Assay (FDA     approved for NASAL specimens     only), is one component of a     comprehensive MRSA colonization     surveillance program. It is not     intended to diagnose MRSA     infection nor to guide or     monitor treatment for     MRSA infections.  CULTURE, EXPECTORATED SPUTUM-ASSESSMENT     Status: None   Collection Time    05/30/14  7:33 AM      Result Value Ref Range Status   Specimen Description SPUTUM   Final   Special Requests NONE   Final   Sputum evaluation     Final   Value: MICROSCOPIC FINDINGS SUGGEST THAT THIS SPECIMEN IS NOT REPRESENTATIVE OF LOWER RESPIRATORY SECRETIONS. PLEASE RECOLLECT.     CALLED TO CAROL SCHILLER AT 0848 BY [facility]   Report Status 05/30/2014  FINAL   Final     Studies: Dg Ercp Biliary & Pancreatic Ducts  05/30/2014   CLINICAL DATA:  Right-sided abdominal pain.  Sepsis.  Cholangitis.  EXAM: ERCP  TECHNIQUE: Multiple spot images obtained with the fluoroscopic device and submitted for interpretation post-procedure.  COMPARISON:  CT abdomen pelvis -05/28/2014  FINDINGS: Thirteen spot intraoperative fluoroscopic images during ERCP are provided for review.  Initial image demonstrates an ERCP probe overlying the right upper abdominal quadrant. Surgical clips overlie the expected location of the gallbladder fossa.  Subsequent images demonstrate selective cannulation and opacification of the common bile duct. The common bile duct appears markedly distended. There are failed persistent filling defects within distal aspect of the CBD (representative image 8).  Completion image demonstrates placement of plastic internal biliary stent overlying the distal CBD with subsequent decompression of the more central common bile duct.  There is very minimal opacification of the central aspect of the intrahepatic biliary system which appears nondilated. There is no definitive opacification of the pancreatic or residual cystic duct.  IMPRESSION: ERCP with findings of persistent ill-defined filling defects within a markedly dilated common bile duct worrisome for choledocholithiasis and/or gallbladder sludge. Post biliary stent placement.  These images were submitted for radiologic interpretation only. Please see the procedural report for the amount of contrast and the fluoroscopy time utilized.   Electronically Signed   By: Eldridge Abrahams.D.  On: 05/30/2014 12:38    Scheduled Meds: . antiseptic oral rinse  7 mL Mouth Rinse BID  . aspirin  162 mg Oral Daily  . budesonide-formoterol  1 puff Inhalation BID  . donepezil  10 mg Oral QHS  . folic acid  1 mg Oral Daily  . heparin  5,000 Units Subcutaneous 3 times per day  . imipenem-cilastatin  500 mg Intravenous 3 times  per day  . insulin aspart  0-15 Units Subcutaneous TID WC  . Memantine HCl ER  28 mg Oral Daily  . metoprolol  50 mg Oral BID  . pantoprazole  20 mg Oral BID  . polyethylene glycol  17 g Oral Daily  . simvastatin  20 mg Oral QPM  . sodium chloride  3 mL Intravenous Q12H  . tiotropium  18 mcg Inhalation Daily   Continuous Infusions: . sodium chloride 50 mL/hr at 05/31/14 2240    Principal Problem:   Sepsis Active Problems:   DM2 (diabetes mellitus, type 2)   Hypertension   Systolic CHF, chronic   Dementia with behavioral disturbance   Acute cholangitis   Nonspecific abnormal results of liver function study   Nonspecific (abnormal) findings on radiological and other examination of biliary tract    Time spent: >35 minutes     Kinnie Feil  [facility] Pager [phone]. If 7PM-7AM, please contact night-coverage at [email or web address], password [facility] 06/01/2014, 8:09 AM  LOS: 4 days

## 2014-06-02 LAB — CBC
HCT: 37.4 % — ABNORMAL LOW (ref 39.0–52.0)
Hemoglobin: 12.1 g/dL — ABNORMAL LOW (ref 13.0–17.0)
MCH: 31.1 pg (ref 26.0–34.0)
MCHC: 32.4 g/dL (ref 30.0–36.0)
MCV: 96.1 fL (ref 78.0–100.0)
PLATELETS: 186 10*3/uL (ref 150–400)
RBC: 3.89 MIL/uL — ABNORMAL LOW (ref 4.22–5.81)
RDW: 14 % (ref 11.5–15.5)
WBC: 5.6 10*3/uL (ref 4.0–10.5)

## 2014-06-02 LAB — COMPREHENSIVE METABOLIC PANEL
ALK PHOS: 172 U/L — AB (ref 39–117)
ALT: 53 U/L (ref 0–53)
AST: 39 U/L — ABNORMAL HIGH (ref 0–37)
Albumin: 2.2 g/dL — ABNORMAL LOW (ref 3.5–5.2)
Anion gap: 11 (ref 5–15)
BUN: 12 mg/dL (ref 6–23)
CO2: 24 meq/L (ref 19–32)
Calcium: 8.3 mg/dL — ABNORMAL LOW (ref 8.4–10.5)
Chloride: 106 mEq/L (ref 96–112)
Creatinine, Ser: 0.92 mg/dL (ref 0.50–1.35)
GFR calc Af Amer: 87 mL/min — ABNORMAL LOW (ref >90)
GFR calc non Af Amer: 75 mL/min — ABNORMAL LOW (ref >90)
GLUCOSE: 129 mg/dL — AB (ref 70–99)
Potassium: 3.9 mEq/L (ref 3.7–5.3)
Sodium: 141 mEq/L (ref 137–147)
Total Bilirubin: 0.4 mg/dL (ref 0.3–1.2)
Total Protein: 5.4 g/dL — ABNORMAL LOW (ref 6.0–8.3)

## 2014-06-02 LAB — GLUCOSE, CAPILLARY
Glucose-Capillary: 139 mg/dL — ABNORMAL HIGH (ref 70–99)
Glucose-Capillary: 126 mg/dL — ABNORMAL HIGH (ref 70–99)

## 2014-06-02 MED ORDER — AMLODIPINE BESYLATE 5 MG PO TABS
5.0000 mg | ORAL_TABLET | Freq: Every day | ORAL | Status: DC
  Administered 2014-06-02 – 2014-06-03 (×2): 5 mg via ORAL
  Filled 2014-06-02 (×2): qty 1

## 2014-06-02 NOTE — Progress Notes (Signed)
[facility] PROGRESS NOTE  [name] IZT:[id number] DOB: 1926-12-30 DOA: 05/28/2014 PCP: Stephens Shire, MD  Assessment/Plan:    80 y/o male with PMH of HTN, DM, CAD, s/p PPM, COPD, dementia, h/o biliary stones s/p perc drainage and removal presents with several episodes of vomiting and some abdominal pain. Found to have cholangitis with CBD stone, post ERCP with stent placement. Much improved clinically.     1. Sepsis suspected biliary source; hemodynamically stable;  -Blood cultures: Noted, Escherichia coli likely culprit. For now continue Primaxin. One set of blood culture drawn on the fourth, 5 days ago growing gram-positive cocci in pairs. This likely is contamination. We'll repeat another set today and monitor. Clinically afebrile with no leukocytosis.    2. Suspected cholangitis, fever+abd pain, hyper bili/alk phosphatase/alt/ast; -CT abd: persistent dilated CBD s/p  removal of percutaneous biliary stents post  biliary sphincterotomy -9/6: s/p ERCP with sphincterotomy/papillotomy and ERCP with biliary stent placement -labs improved, f/u in AM ; clinically better; cont IV atx, GI following     2. COPD, no wheezing on exam, CXR: no clear pneumonia;  -cont bronchodilators as needed     3. HTN - blood pressure is slightly high, continue beta blocker added Norvasc for better control.    4. CAD history; no active chest pain; cont monitoring on aspirin and beta blocker.    5. H/o DM not on meds; cont ISS:   CBG (last 3)   Recent Labs  06/01/14 1656 06/01/14 2134 06/02/14 0755  GLUCAP 165* 111* 139*      6. Delirium in the context of sepsis, dementia -cont treat infection; supportive care , wife informed personally.     ERCP   1. There was a large periampullary diverticulum in in the distal  part of D2 which completely surrounded the ampulla. Ampulla was  difficult to locate. It was located at the deepest point in the  diverticulum. CBD was  freely cannulated with a catheter and then  with a guidewire. Positioning was difficult with the large  periampullary diverticulum in the distal part of D2. PD was not  cannulated or injected by intention.  2. Three large filling defects were seen in the distal common bile  duct-appeared to be sludge and stones.  3. There was dilation of the common bile duct measuring 17 mm down  to the level of the ampulla. Intrahepatic ducts were not dilated.  4. With guidewire in the bile duct, a small biliary sphincterotomy  was performed using the sphincterotome. I was unable to safely  extend the sphincterotomy to an adequate size due to the  diverticulum. The sphincterotomy was not large enough for balloon  extraction of the filling defects and positioning was challenging.  5. Under endoscopic and fluoroscopic guidance a 10 Fr x 7 cm double  flap stent was placed in the bile duct. Sludge, bile and contast  slowly drained from the stent. The stent was in very good position  both by fluroscopy and endoscopy.  The scope was then completely withdrawn from the patient and the  procedure terminated.  COMPLICATIONS:. There were no complications.  ENDOSCOPIC IMPRESSION:  1. Large periampullary diverticulum  2. Filling defects in the distal common bile duct  3. Dilation of the common bile duct  4. Small biliary sphincterotomy performed  5. 10 Fr x 7 cm biliary stent placed  RECOMMENDATIONS:  1. IV antibiotics  2. trend liver enzymes      Code Status: DNR Family Communication: d/w wife in  detail, she confirms DO NOT RESUSCITATE, wants to take him home. Disposition Plan: Likely home, PT to evaluate.  DVT prophylaxis heparin.   Consultants:  GI     Anti-infectives   Start     Dose/Rate Route Frequency Ordered Stop   05/31/14 1000  levofloxacin (LEVAQUIN) IVPB 750 mg  Status:  Discontinued     750 mg 100 mL/hr over 90 Minutes Intravenous Every 24 hours 05/30/14 1027 05/31/14 0758    05/31/14 1000  imipenem-cilastatin (PRIMAXIN) 500 mg in sodium chloride 0.9 % 100 mL IVPB     500 mg 200 mL/hr over 30 Minutes Intravenous 3 times per day 05/31/14 0911     05/30/14 1000  levofloxacin (LEVAQUIN) IVPB 750 mg  Status:  Discontinued     750 mg 100 mL/hr over 90 Minutes Intravenous Every 48 hours 05/29/14 0847 05/30/14 1027   05/29/14 1600  levofloxacin (LEVAQUIN) IVPB 750 mg  Status:  Discontinued     750 mg 100 mL/hr over 90 Minutes Intravenous Every 24 hours 05/28/14 1602 05/29/14 0847   05/29/14 0530  vancomycin (VANCOCIN) IVPB 750 mg/150 ml premix  Status:  Discontinued     750 mg 150 mL/hr over 60 Minutes Intravenous Every 12 hours 05/28/14 1815 05/31/14 0758   05/29/14 0300  aztreonam (AZACTAM) 1 g in dextrose 5 % 50 mL IVPB  Status:  Discontinued     1 g 100 mL/hr over 30 Minutes Intravenous Every 8 hours 05/28/14 2044 05/28/14 2059   05/29/14 0300  aztreonam (AZACTAM) 2 g in dextrose 5 % 50 mL IVPB  Status:  Discontinued     2 g 100 mL/hr over 30 Minutes Intravenous Every 8 hours 05/28/14 2059 05/31/14 0758   05/28/14 1835  aztreonam (AZACTAM) 1 G injection    Comments:  [name]   : cabinet override      05/28/14 1835 05/29/14 0644   05/28/14 1545  levofloxacin (LEVAQUIN) IVPB 750 mg     750 mg 100 mL/hr over 90 Minutes Intravenous  Once 05/28/14 1540 05/28/14 1735   05/28/14 1545  aztreonam (AZACTAM) 2 g in dextrose 5 % 50 mL IVPB     2 g 100 mL/hr over 30 Minutes Intravenous  Once 05/28/14 1540 05/28/14 1918   05/28/14 1545  vancomycin (VANCOCIN) IVPB 1000 mg/200 mL premix     1,000 mg 200 mL/hr over 60 Minutes Intravenous  Once 05/28/14 1540 05/28/14 1848      HPI/Subjective:  In bed, denies any headache, no chest abdominal pain. Poor historian. Moving all 4 extremities.   Objective: Filed Vitals:   06/02/14 1011  BP: 164/79  Pulse: 74  Temp: 98.1 F (36.7 C)  Resp: 17    Intake/Output Summary (Last 24 hours) at 06/02/14 1040 Last data  filed at 06/02/14 1036  Gross per 24 hour  Intake     50 ml  Output   1000 ml  Net   -950 ml   Filed Weights   05/28/14 2033 05/31/14 2100 06/01/14 2130  Weight: 93.6 kg (206 lb 5.6 oz) 91.1 kg (200 lb 13.4 oz) 91.099 kg (200 lb 13.4 oz)    Exam:   General:  Alert, but confused at baseline   Cardiovascular: s1,s2 rrr  Respiratory: few crackles in LL  Abdomen: soft, distended, mild RUQ tender  Musculoskeletal: no LE edema   Data Reviewed: Basic Metabolic Panel:  Recent Labs Lab 05/28/14 1400 05/29/14 0248 05/30/14 0331 06/02/14 0610  NA 138 139 137 141  K 4.3 4.3 3.9 3.9  CL 97 101 101 106  CO2 22 27 25 24   GLUCOSE 326* 166* 150* 129*  BUN 13 15 14 12   CREATININE 1.10 1.28 1.15 0.92  CALCIUM 9.5 8.4 8.4 8.3*   Liver Function Tests:  Recent Labs Lab 05/28/14 1400 05/29/14 0248 05/30/14 0331 06/02/14 0610  AST 580* 476* 182* 39*  ALT 179* 239* 163* 53  ALKPHOS 361* 258* 234* 172*  BILITOT 1.9* 2.6* 1.1 0.4  PROT 6.7 5.2* 5.4* 5.4*  ALBUMIN 3.0* 2.3* 2.3* 2.2*    Recent Labs Lab 05/28/14 1504  LIPASE 27   No results found for this basename: AMMONIA,  in the last 168 hours CBC:  Recent Labs Lab 05/28/14 1400 05/29/14 0248 05/30/14 0331 06/02/14 0610  WBC 11.7* 14.5* 7.8 5.6  NEUTROABS 11.0*  --   --   --   HGB 13.8 11.5* 11.9* 12.1*  HCT 41.8 35.0* 36.3* 37.4*  MCV 93.9 95.4 95.0 96.1  PLT 163 162 164 186   Cardiac Enzymes:  Recent Labs Lab 05/28/14 1400  TROPONINI <0.30   BNP (last 3 results)  Recent Labs  05/28/14 1400  PROBNP 434.4   CBG:  Recent Labs Lab 06/01/14 0734 06/01/14 1208 06/01/14 1656 06/01/14 2134 06/02/14 0755  GLUCAP 110* 117* 165* 111* 139*    Recent Results (from the past 240 hour(s))  CULTURE, BLOOD (ROUTINE X 2)     Status: None   Collection Time    05/28/14  2:00 PM      Result Value Ref Range Status   Specimen Description BLOOD LEFT FA   Final   Special Requests BOTTLES DRAWN AEROBIC AND  ANAEROBIC [facility] EACH   Final   Culture  Setup Time     Final   Value: 05/28/2014 18:40     Performed at [facility]   Culture     Final   Value: GRAM POSITIVE COCCI IN PAIRS     Note: Gram Stain Report Called to,Read Back By and Verified With:  MICHELLE MCMILLIAN @0109  ON 06/02/14 SMIAS     Performed at [facility]   Report Status PENDING   Incomplete  CULTURE, BLOOD (ROUTINE X 2)     Status: None   Collection Time    05/28/14  2:15 PM      Result Value Ref Range Status   Specimen Description BLOOD LEFT WRIST   Final   Special Requests BOTTLES DRAWN AEROBIC AND ANAEROBIC [facility] EACH   Final   Culture  Setup Time     Final   Value: 05/28/2014 18:40     Performed at [facility]   Culture     Final   Value: ESCHERICHIA COLI     Note: Gram Stain Report Called to,Read Back By and Verified With: CAROL SCHILLER 05/29/14 @ 345PM BY RUSCOE A.     Performed at [facility]   Report Status 05/31/2014 FINAL   Final   Organism ID, Bacteria ESCHERICHIA COLI   Final  URINE CULTURE     Status: None   Collection Time    05/28/14  3:46 PM      Result Value Ref Range Status   Specimen Description URINE, CATHETERIZED   Final   Special Requests NONE   Final   Culture  Setup Time     Final   Value: 05/28/2014 18:43     Performed at [place]  Final   Value: NO GROWTH     Performed at [facility]   Culture     Final   Value: NO GROWTH     Performed at [facility]   Report Status 05/29/2014 FINAL   Final  MRSA PCR SCREENING     Status: None   Collection Time    05/28/14  8:42 PM      Result Value Ref Range Status   MRSA by PCR NEGATIVE  NEGATIVE Final   Comment:            The GeneXpert MRSA Assay (FDA     approved for NASAL specimens     only), is one component of a     comprehensive MRSA colonization     surveillance program. It is not     intended to diagnose MRSA     infection nor to guide or     monitor  treatment for     MRSA infections.  CULTURE, EXPECTORATED SPUTUM-ASSESSMENT     Status: None   Collection Time    05/30/14  7:33 AM      Result Value Ref Range Status   Specimen Description SPUTUM   Final   Special Requests NONE   Final   Sputum evaluation     Final   Value: MICROSCOPIC FINDINGS SUGGEST THAT THIS SPECIMEN IS NOT REPRESENTATIVE OF LOWER RESPIRATORY SECRETIONS. PLEASE RECOLLECT.     CALLED TO CAROL SCHILLER AT 0848 BY [facility]   Report Status 05/30/2014 FINAL   Final     Studies: No results found.  Scheduled Meds: . antiseptic oral rinse  7 mL Mouth Rinse BID  . aspirin  162 mg Oral Daily  . budesonide-formoterol  1 puff Inhalation BID  . donepezil  10 mg Oral QHS  . folic acid  1 mg Oral Daily  . heparin  5,000 Units Subcutaneous 3 times per day  . imipenem-cilastatin  500 mg Intravenous 3 times per day  . insulin aspart  0-15 Units Subcutaneous TID WC  . Memantine HCl ER  28 mg Oral Daily  . metoprolol  50 mg Oral BID  . pantoprazole  20 mg Oral BID  . polyethylene glycol  17 g Oral Daily  . simvastatin  20 mg Oral QPM  . sodium chloride  3 mL Intravenous Q12H  . tiotropium  18 mcg Inhalation Daily   Continuous Infusions:    Principal Problem:   Sepsis Active Problems:   DM2 (diabetes mellitus, type 2)   Hypertension   Systolic CHF, chronic   Dementia with behavioral disturbance   Acute cholangitis   Nonspecific abnormal results of liver function study   Nonspecific (abnormal) findings on radiological and other examination of biliary tract    Time spent: >35 minutes     [facility] Pager [phone]. If 7PM-7AM, please contact night-coverage at [email or web address], password [facility] 06/02/2014, 10:40 AM  LOS: 5 days

## 2014-06-02 NOTE — Care Management Note (Signed)
CARE MANAGEMENT NOTE 06/02/2014  Patient:  VIREN, LEBEAU   Account Number:  [id number]  Date Initiated:  06/01/2014  Documentation initiated by:  Mauri Temkin  Subjective/Objective Assessment:   CM following for progession and d/c planning.     Action/Plan:   06/01/14 Met with pt and IM explained, left on bedside table.  06/02/2014 Met with pt wife re d/c plans, pt will d/c to home she selected [facility] for [facility] no PT, has DME. IM given to wife.   Anticipated DC Date:  06/04/2014   Anticipated DC Plan:  [place]         Choice offered to / List presented to:  C-3 Spouse        HH arranged  HH-1 RN      [place].   Status of service:  In process, will continue to follow Medicare Important Message given?  YES (If response is "NO", the following Medicare IM given date fields will be blank) Date Medicare IM given:  06/01/2014 Medicare IM given by:  Sultana Tierney Date Additional Medicare IM given:  06/02/2014 Additional Medicare IM given by:  [facility]  Discharge Disposition:    Per UR Regulation:    If discussed at Long Length of Stay Meetings, dates discussed:    Comments:  06/02/2014 Pt has DME per wife, plan for d/c to home, has used [facility] previously and wishes to use them again. [place] notified of need for [facility], wife declined HHPT as pt has had previously and was not receptive. Will continue to follow. Jasmine Pang RN MPH, case manager, [phone]

## 2014-06-02 NOTE — Clinical Social Work Psychosocial (Signed)
Clinical Social Work Department BRIEF PSYCHOSOCIAL ASSESSMENT 06/02/2014  Patient:  [name], [name]     Account Number:  [id number]     Admit date:  05/28/2014  Clinical Social Worker:  Frederico Hamman  Date/Time:  06/02/2014 01:09 AM  Referred by:  Physician  Date Referred:  06/01/2014 Referred for  SNF Placement   Other Referral:   Interview type:  Family Other interview type:    PSYCHOSOCIAL DATA Living Status:  WIFE Admitted from facility:   Level of care:   Primary support name:  [name] Primary support relationship to patient:  SPOUSE Degree of support available:   Strong support from wife    CURRENT CONCERNS Current Concerns  Post-Acute Placement   Other Concerns:    SOCIAL WORK ASSESSMENT / PLAN CSW talked with wife about patient and discharge plans. Mrs. Lizer advised that prior to this admission, patient's last hospitalization was in April of 2014. Wife reported that patient is independent at home with his ADL's, but did have some confusion and a 104 degree temperature prior to admission. She indicated that he was having hallucinations, but these stopped yesterday, 9/8, and patient is now talking and "making sense", as well as eating solid foods well.  The nurse case manager mentioned the speed that patient was walking with the physical therapist today and wife responded that he always walks fast at home. Mrs. Madeira stated that patient has a cane and walker at home and they have a commode that is high.    Wife reports that they have one daughter, Jenny Reichmann who lives in [place] and works for [employer]. Per Mrs. Eustice she is available when needed. Wife also reports that they have a great support system of neighbors and church friends that are available whenever needed. Mrs. Berline Lopes plans to take her husband home at discharge and feels that he will be fine and safe at home. When asked by nurse case manager Jasmine Pang about [facility] agencies, they have used [facility] in the past.   Assessment/plan status:  No Further Intervention Required Other assessment/ plan:   Information/referral to community resources:   None needed or requested at this time.    PATIENT'S/FAMILY'S RESPONSE TO PLAN OF CARE: Mrs. Mccaster was very receptive to talking with CSW and nurse case manager and was very engaged during the conversation about her husband.  Her plan is to take patient home with [facility] nursing services.

## 2014-06-02 NOTE — ED Provider Notes (Signed)
Medical screening examination/treatment/procedure(s) were conducted as a shared visit with non-physician practitioner(s) and myself.  I personally evaluated the patient during the encounter.   EKG Interpretation   Date/Time:  Friday May 28 2014 13:45:42 EDT Ventricular Rate:  117 PR Interval:  160 QRS Duration: 108 QT Interval:  370 QTC Calculation: 516 R Axis:   -74 Text Interpretation:  Sinus tachycardia Left axis deviation Anterolateral  infarct , age undetermined Abnormal ECG ED PHYSICIAN INTERPRETATION  AVAILABLE IN [facility] Confirmed by TEST, Record ([id number]) on 05/30/2014  8:47:42 AM      See additional documentation.  Merryl Hacker, MD 06/02/14 [phone]

## 2014-06-02 NOTE — Progress Notes (Signed)
Subjective: No acute events reported by Nursing.  Objective: Vital signs in last 24 hours: Temp:  [97.7 F (36.5 C)-98.3 F (36.8 C)] 98.3 F (36.8 C) (09/09 0600) Pulse Rate:  [67-75] 69 (09/09 0600) Resp:  [18] 18 (09/09 0600) BP: (148-166)/(65-71) 166/65 mmHg (09/09 0600) SpO2:  [94 %-97 %] 96 % (09/09 0600) Weight:  [91.099 kg (200 lb 13.4 oz)] 91.099 kg (200 lb 13.4 oz) (09/08 2130)    Intake/Output from previous day: 09/08 0701 - 09/09 0700 In: -  Out: 850 [Urine:850] Intake/Output this shift:    General appearance: arousable, heard of hearing, grunting GI: soft, non-tender; bowel sounds normal; no masses,  no organomegaly  Lab Results: No results found for this basename: WBC, HGB, HCT, PLT,  in the last 72 hours BMET No results found for this basename: NA, K, CL, CO2, GLUCOSE, BUN, CREATININE, CALCIUM,  in the last 72 hours LFT No results found for this basename: PROT, ALBUMIN, AST, ALT, ALKPHOS, BILITOT, BILIDIR, IBILI,  in the last 72 hours PT/INR No results found for this basename: LABPROT, INR,  in the last 72 hours Hepatitis Panel No results found for this basename: HEPBSAG, HCVAB, HEPAIGM, HEPBIGM,  in the last 72 hours C-Diff No results found for this basename: CDIFFTOX,  in the last 72 hours Fecal Lactopherrin No results found for this basename: FECLLACTOFRN,  in the last 72 hours  Studies/Results: No results found.  Medications:  Scheduled: . antiseptic oral rinse  7 mL Mouth Rinse BID  . aspirin  162 mg Oral Daily  . budesonide-formoterol  1 puff Inhalation BID  . donepezil  10 mg Oral QHS  . folic acid  1 mg Oral Daily  . heparin  5,000 Units Subcutaneous 3 times per day  . imipenem-cilastatin  500 mg Intravenous 3 times per day  . insulin aspart  0-15 Units Subcutaneous TID WC  . Memantine HCl ER  28 mg Oral Daily  . metoprolol  50 mg Oral BID  . pantoprazole  20 mg Oral BID  . polyethylene glycol  17 g Oral Daily  . simvastatin  20 mg Oral  QPM  . sodium chloride  3 mL Intravenous Q12H  . tiotropium  18 mcg Inhalation Daily   Continuous:   Assessment/Plan: 1) Choledocholithiasis with cholangitis s/p biliary stent placement. 2) Multiple medical problems.   The patient appears stable, i.e., afebrile and moderately hypertensive.  No evidence of a persistent cholangitis.  Blood work is pending for this morning, but if there is no serological evidence of a persistent infection and his transaminases are declining, he can be discharged home.  Plan: 1) I will have him follow up as an outpatient. 2) D/C home when stable. 3) Signing off.   LOS: 5 days   Mckinna Demars D 06/02/2014, 7:36 AM

## 2014-06-02 NOTE — Evaluation (Signed)
Physical Therapy Evaluation Patient Details Name: LANG ZINGG MRN: [id number] DOB: [birth year] Today's Date: 06/02/2014   History of Present Illness    80 y/o male with PMH of HTN, DM, CAD, s/p PPM, COPD, dementia, h/o biliary stones s/p perc drainage and removal presents with several episodes of vomiting and some abdominal pain. Found to have cholangitis with CBD stone, post ERCP with stent placement. Much improved clinically.      Clinical Impression  Pt admitted with above. Pt currently with functional limitations due to balance and endurance deficits.  May need RW at home.  HHPT recommended.  Wife states pt is close to baseline.  Pt will benefit from skilled PT to increase their independence and safety with mobility to allow discharge to the venue listed below.     Follow Up Recommendations Home health PT;Supervision/Assistance - 24 hour    Equipment Recommendations  Rolling walker with 5" wheels    Recommendations for Other Services       Precautions / Restrictions Precautions Precautions: Fall Restrictions Weight Bearing Restrictions: No      Mobility  Bed Mobility Overal bed mobility: Needs Assistance Bed Mobility: Supine to Sit     Supine to sit: Min assist     General bed mobility comments: pt grabbed for PT's hand.  Wife provides this assist at home.   Transfers Overall transfer level: Needs assistance Equipment used: Rolling walker (2 wheeled);None Transfers: Sit to/from Stand Sit to Stand: Min guard         General transfer comment: Pt needed cues for hand placement.   Ambulation/Gait Ambulation/Gait assistance: Min guard;Min assist Ambulation Distance (Feet): 250 Feet Assistive device: None;Rolling walker (2 wheeled) Gait Pattern/deviations: Step-to pattern;Decreased stride length;Steppage;Wide base of support;Trunk flexed   Gait velocity interpretation: <1.8 ft/sec, indicative of risk for recurrent falls General Gait Details: Pt somewhat  impulsive with movements and needs cues to slow down.  Ambulated without device and was slightly unsteady however with RW pt was steadier but needed cues to slow down and be safe with the RW.  Wife aware that pt has safety issues and said pt staggered at times PTA.  Pt cannot withstand challenges to balance.    Stairs            Wheelchair Mobility    Modified Rankin (Stroke Patients Only)       Balance Overall balance assessment: Needs assistance;History of Falls Sitting-balance support: No upper extremity supported;Feet supported Sitting balance-Leahy Scale: Fair     Standing balance support: No upper extremity supported;During functional activity Standing balance-Leahy Scale: Fair Standing balance comment: can stand statically without UE support but needs UE support for dynamic balance.               High level balance activites: Direction changes;Turns;Sudden stops High Level Balance Comments: Needed min assist for safety with high level activities with and without RW.  Wife aware of pt instability.               Pertinent Vitals/Pain Pain Assessment: No/denies pain VSS    Home Living Family/patient expects to be discharged to:: Private residence Living Arrangements: Spouse/significant other Available Help at Discharge: Family;Available 24 hours/day Type of Home: House Home Access: Stairs to enter Entrance Stairs-Rails: Right;Left;Can reach both Entrance Stairs-Number of Steps: 2 Home Layout: One level Home Equipment: Walker - 2 wheels;Cane - single point;Grab bars - tub/shower;Grab bars - toilet      Prior Function Level of Independence: Independent  Hand Dominance   Dominant Hand: Right    Extremity/Trunk Assessment   Upper Extremity Assessment: Defer to OT evaluation           Lower Extremity Assessment: Generalized weakness      Cervical / Trunk Assessment: Kyphotic  Communication   Communication: HOH  Cognition  Arousal/Alertness: Awake/alert Behavior During Therapy: WFL for tasks assessed/performed Overall Cognitive Status: Within Functional Limits for tasks assessed                      General Comments      Exercises General Exercises - Lower Extremity Ankle Circles/Pumps: AROM;Both;10 reps;Supine Long Arc Quad: AROM;Both;10 reps;Seated      Assessment/Plan    PT Assessment Patient needs continued PT services  PT Diagnosis Generalized weakness   PT Problem List Decreased activity tolerance;Decreased balance;Decreased mobility;Decreased strength;Decreased knowledge of use of DME;Decreased safety awareness;Decreased knowledge of precautions  PT Treatment Interventions DME instruction;Gait training;Functional mobility training;Therapeutic activities;Therapeutic exercise;Balance training;Patient/family education   PT Goals (Current goals can be found in the Care Plan section) Acute Rehab PT Goals Patient Stated Goal: to go home PT Goal Formulation: With patient Time For Goal Achievement: 06/09/14 Potential to Achieve Goals: Good    Frequency Min 3X/week   Barriers to discharge        Co-evaluation               End of Session Equipment Utilized During Treatment: Gait belt Activity Tolerance: Patient limited by fatigue Patient left: in chair;with call bell/phone within reach;with family/visitor present;with nursing/sitter in room Nurse Communication: Mobility status         Time: 1150-1208 PT Time Calculation (min): 18 min   Charges:   PT Evaluation $Initial PT Evaluation Tier I: 1 Procedure PT Treatments $Gait Training: 8-22 mins   PT G Codes:          INGOLD,Maresha Anastos 06-10-2014, 1:19 PM [facility] Acute Rehabilitation [phone] (pager)

## 2014-06-03 ENCOUNTER — Ambulatory Visit: Payer: Medicare Other | Admitting: Cardiology

## 2014-06-03 LAB — GLUCOSE, CAPILLARY
GLUCOSE-CAPILLARY: 152 mg/dL — AB (ref 70–99)
GLUCOSE-CAPILLARY: 132 mg/dL — AB (ref 70–99)
GLUCOSE-CAPILLARY: 132 mg/dL — AB (ref 70–99)
GLUCOSE-CAPILLARY: 136 mg/dL — AB (ref 70–99)
GLUCOSE-CAPILLARY: 90 mg/dL (ref 70–99)
Glucose-Capillary: 151 mg/dL — ABNORMAL HIGH (ref 70–99)

## 2014-06-03 MED ORDER — AMLODIPINE BESYLATE 10 MG PO TABS
10.0000 mg | ORAL_TABLET | Freq: Every day | ORAL | Status: DC
  Administered 2014-06-04 – 2014-06-05 (×2): 10 mg via ORAL
  Filled 2014-06-03 (×2): qty 1

## 2014-06-03 NOTE — Progress Notes (Signed)
Patient pulled out panda tube. MD paged.

## 2014-06-03 NOTE — Progress Notes (Signed)
ANTIBIOTIC CONSULT NOTE - FOLLOW UP  Pharmacy Consult for Primaxin Indication: E. Coli bacteremia  Allergies  Allergen Reactions  . Doxycycline Other (See Comments)    Unknown- patient doesn't remember  . Penicillins Rash    Patient Measurements: Height: 5\' 8"  (172.7 cm) Weight: 203 lb 11.3 oz (92.4 kg) IBW/kg (Calculated) : 68.4  Vital Signs: Temp: 97.9 F (36.6 C) (09/10 1043) Temp src: Oral (09/10 1043) BP: 157/84 mmHg (09/10 1043) Pulse Rate: 81 (09/10 1043) Intake/Output from previous day: 09/09 0701 - 09/10 0700 In: 950 [P.O.:650; IV Piggyback:300] Out: 2750 [Urine:2750] Intake/Output from this shift:    Labs:  Recent Labs  06/02/14 0610  WBC 5.6  HGB 12.1*  PLT 186  CREATININE 0.92   Estimated Creatinine Clearance: 65.9 ml/min (by C-G formula based on Cr of 0.92). No results found for this basename: VANCOTROUGH, Corlis Leak, VANCORANDOM, GENTTROUGH, GENTPEAK, GENTRANDOM, TOBRATROUGH, TOBRAPEAK, TOBRARND, AMIKACINPEAK, AMIKACINTROU, AMIKACIN,  in the last 72 hours   Microbiology: Recent Results (from the past 720 hour(s))  CULTURE, BLOOD (ROUTINE X 2)     Status: None   Collection Time    05/28/14  2:00 PM      Result Value Ref Range Status   Specimen Description BLOOD LEFT FA   Final   Special Requests BOTTLES DRAWN AEROBIC AND ANAEROBIC [facility] EACH   Final   Culture  Setup Time     Final   Value: 05/28/2014 18:40     Performed at [facility]   Culture     Final   Value: GRAM POSITIVE COCCI IN PAIRS     Note: Gram Stain Report Called to,Read Back By and Verified With:  MICHELLE MCMILLIAN @0109  ON 06/02/14 SMIAS     Performed at [facility]   Report Status PENDING   Incomplete  CULTURE, BLOOD (ROUTINE X 2)     Status: None   Collection Time    05/28/14  2:15 PM      Result Value Ref Range Status   Specimen Description BLOOD LEFT WRIST   Final   Special Requests BOTTLES DRAWN AEROBIC AND ANAEROBIC [facility] EACH   Final   Culture  Setup Time      Final   Value: 05/28/2014 18:40     Performed at [facility]   Culture     Final   Value: ESCHERICHIA COLI     Note: Gram Stain Report Called to,Read Back By and Verified With: CAROL SCHILLER 05/29/14 @ 345PM BY RUSCOE A.     Performed at [facility]   Report Status 05/31/2014 FINAL   Final   Organism ID, Bacteria ESCHERICHIA COLI   Final  URINE CULTURE     Status: None   Collection Time    05/28/14  3:46 PM      Result Value Ref Range Status   Specimen Description URINE, CATHETERIZED   Final   Special Requests NONE   Final   Culture  Setup Time     Final   Value: 05/28/2014 18:43     Performed at [place]     Final   Value: NO GROWTH     Performed at [facility]   Culture     Final   Value: NO GROWTH     Performed at [facility]   Report Status 05/29/2014 FINAL   Final  MRSA PCR SCREENING     Status: None   Collection Time  05/28/14  8:42 PM      Result Value Ref Range Status   MRSA by PCR NEGATIVE  NEGATIVE Final   Comment:            The GeneXpert MRSA Assay (FDA     approved for NASAL specimens     only), is one component of a     comprehensive MRSA colonization     surveillance program. It is not     intended to diagnose MRSA     infection nor to guide or     monitor treatment for     MRSA infections.  CULTURE, EXPECTORATED SPUTUM-ASSESSMENT     Status: None   Collection Time    05/30/14  7:33 AM      Result Value Ref Range Status   Specimen Description SPUTUM   Final   Special Requests NONE   Final   Sputum evaluation     Final   Value: MICROSCOPIC FINDINGS SUGGEST THAT THIS SPECIMEN IS NOT REPRESENTATIVE OF LOWER RESPIRATORY SECRETIONS. PLEASE RECOLLECT.     CALLED TO CAROL SCHILLER AT 0848 BY [place]   Report Status 05/30/2014 FINAL   Final  CULTURE, BLOOD (ROUTINE X 2)     Status: None   Collection Time    06/02/14 12:31 PM      Result Value Ref Range Status   Specimen Description BLOOD  RIGHT HAND   Final   Special Requests BOTTLES DRAWN AEROBIC AND ANAEROBIC 10CC   Final   Culture  Setup Time     Final   Value: 06/02/2014 17:19     Performed at [facility]   Culture     Final   Value:        BLOOD CULTURE RECEIVED NO GROWTH TO DATE CULTURE WILL BE HELD FOR 5 DAYS BEFORE ISSUING A FINAL NEGATIVE REPORT     Performed at [facility]   Report Status PENDING   Incomplete  CULTURE, BLOOD (ROUTINE X 2)     Status: None   Collection Time    06/02/14 12:40 PM      Result Value Ref Range Status   Specimen Description BLOOD LEFT HAND   Final   Special Requests BOTTLES DRAWN AEROBIC ONLY 5CC   Final   Culture  Setup Time     Final   Value: 06/02/2014 17:17     Performed at [facility]   Culture     Final   Value:        BLOOD CULTURE RECEIVED NO GROWTH TO DATE CULTURE WILL BE HELD FOR 5 DAYS BEFORE ISSUING A FINAL NEGATIVE REPORT     Performed at [facility]   Report Status PENDING   Incomplete    Anti-infectives   Start     Dose/Rate Route Frequency Ordered Stop   05/31/14 1000  levofloxacin (LEVAQUIN) IVPB 750 mg  Status:  Discontinued     750 mg 100 mL/hr over 90 Minutes Intravenous Every 24 hours 05/30/14 1027 05/31/14 0758   05/31/14 1000  imipenem-cilastatin (PRIMAXIN) 500 mg in sodium chloride 0.9 % 100 mL IVPB     500 mg 200 mL/hr over 30 Minutes Intravenous 3 times per day 05/31/14 0911     05/30/14 1000  levofloxacin (LEVAQUIN) IVPB 750 mg  Status:  Discontinued     750 mg 100 mL/hr over 90 Minutes Intravenous Every 48 hours 05/29/14 0847 05/30/14 1027   05/29/14 1600  levofloxacin (LEVAQUIN) IVPB 750 mg  Status:  Discontinued     750 mg 100 mL/hr over 90 Minutes Intravenous Every 24 hours 05/28/14 1602 05/29/14 0847   05/29/14 0530  vancomycin (VANCOCIN) IVPB 750 mg/150 ml premix  Status:  Discontinued     750 mg 150 mL/hr over 60 Minutes Intravenous Every 12 hours 05/28/14 1815 05/31/14 0758   05/29/14 0300  aztreonam  (AZACTAM) 1 g in dextrose 5 % 50 mL IVPB  Status:  Discontinued     1 g 100 mL/hr over 30 Minutes Intravenous Every 8 hours 05/28/14 2044 05/28/14 2059   05/29/14 0300  aztreonam (AZACTAM) 2 g in dextrose 5 % 50 mL IVPB  Status:  Discontinued     2 g 100 mL/hr over 30 Minutes Intravenous Every 8 hours 05/28/14 2059 05/31/14 0758   05/28/14 1835  aztreonam (AZACTAM) 1 G injection    Comments:  [name]   : cabinet override      05/28/14 1835 05/29/14 0644   05/28/14 1545  levofloxacin (LEVAQUIN) IVPB 750 mg     750 mg 100 mL/hr over 90 Minutes Intravenous  Once 05/28/14 1540 05/28/14 1735   05/28/14 1545  aztreonam (AZACTAM) 2 g in dextrose 5 % 50 mL IVPB     2 g 100 mL/hr over 30 Minutes Intravenous  Once 05/28/14 1540 05/28/14 1918   05/28/14 1545  vancomycin (VANCOCIN) IVPB 1000 mg/200 mL premix     1,000 mg 200 mL/hr over 60 Minutes Intravenous  Once 05/28/14 1540 05/28/14 1848      Assessment: 86 YOM who continues on Primaxin for E. Coli bactermia thought to be d/t biliary source. The E. Coli is noted to be sensitive to alternative antibiotics that are not as broad (i.e Rocephin) - communication has been made to consider de-escalation. Afebrile, WBC wnl, renal function stable - dose remains appropriate.   Goal of Therapy:  Proper antibiotics for infection/cultures adjusted for renal/hepatic function   Plan:  1. Continue Primaxin 500 mg IV every 8 hours 2. Consider de-escalation of antibiotic therapy 3. Will continue to follow renal function, culture results, LOT, and antibiotic de-escalation plans   Alycia Rossetti, PharmD, BCPS Clinical Pharmacist Pager: [phone] 06/03/2014 1:55 PM

## 2014-06-03 NOTE — Progress Notes (Signed)
[facility] PROGRESS NOTE  [name] UMP:[id number] DOB: [birth year] DOA: 05/28/2014 PCP: Stephens Shire, MD  Assessment/Plan:    80 y/o male with PMH of HTN, DM, CAD, s/p PPM, COPD, dementia, h/o biliary stones s/p perc drainage and removal presents with several episodes of vomiting and some abdominal pain. Found to have cholangitis with CBD stone, post ERCP with stent placement. Much improved clinically.     1. Sepsis suspected biliary source; hemodynamically stable;  -Blood cultures: Noted, Escherichia coli likely culprit. For now continue Primaxin. One set of blood culture drawn on the fourth, 5 days ago growing gram-positive cocci in pairs. This likely is contamination. Repeated  another set today and monitor. Clinically afebrile with no leukocytosis.    2. Suspected cholangitis, fever+abd pain, hyper bili/alk phosphatase/alt/ast; -CT abd: persistent dilated CBD s/p  removal of percutaneous biliary stents post  biliary sphincterotomy -9/6: s/p ERCP with sphincterotomy/papillotomy and ERCP with biliary stent placement -labs improved, f/u in AM ; clinically better; cont IV atx, GI following     2. COPD, no wheezing on exam, CXR: no clear pneumonia;  -cont bronchodilators as needed     3. HTN - blood pressure is slightly high, continue beta blocker added Norvasc for better control.    4. CAD history; no active chest pain; cont monitoring on aspirin and beta blocker.    5. H/o DM not on meds; cont ISS:   CBG (last 3)   Recent Labs  06/02/14 1728 06/02/14 2119 06/03/14 0750  GLUCAP 152* 132* 132*      6. Delirium in the context of sepsis, dementia -cont treat infection; supportive care , wife informed personally.     ERCP   1. There was a large periampullary diverticulum in in the distal  part of D2 which completely surrounded the ampulla. Ampulla was  difficult to locate. It was located at the deepest point in the  diverticulum. CBD was  freely cannulated with a catheter and then  with a guidewire. Positioning was difficult with the large  periampullary diverticulum in the distal part of D2. PD was not  cannulated or injected by intention.  2. Three large filling defects were seen in the distal common bile  duct-appeared to be sludge and stones.  3. There was dilation of the common bile duct measuring 17 mm down  to the level of the ampulla. Intrahepatic ducts were not dilated.  4. With guidewire in the bile duct, a small biliary sphincterotomy  was performed using the sphincterotome. I was unable to safely  extend the sphincterotomy to an adequate size due to the  diverticulum. The sphincterotomy was not large enough for balloon  extraction of the filling defects and positioning was challenging.  5. Under endoscopic and fluoroscopic guidance a 10 Fr x 7 cm double  flap stent was placed in the bile duct. Sludge, bile and contast  slowly drained from the stent. The stent was in very good position  both by fluroscopy and endoscopy.  The scope was then completely withdrawn from the patient and the  procedure terminated.  COMPLICATIONS:. There were no complications.  ENDOSCOPIC IMPRESSION:  1. Large periampullary diverticulum  2. Filling defects in the distal common bile duct  3. Dilation of the common bile duct  4. Small biliary sphincterotomy performed  5. 10 Fr x 7 cm biliary stent placed  RECOMMENDATIONS:  1. IV antibiotics  2. trend liver enzymes      Code Status: DNR Family Communication: d/w wife in  detail, she confirms DO NOT RESUSCITATE, wants to take him home. Disposition Plan: Likely home, PT to evaluate.  DVT prophylaxis heparin.   Consultants:  GI     Anti-infectives   Start     Dose/Rate Route Frequency Ordered Stop   05/31/14 1000  levofloxacin (LEVAQUIN) IVPB 750 mg  Status:  Discontinued     750 mg 100 mL/hr over 90 Minutes Intravenous Every 24 hours 05/30/14 1027 05/31/14 0758    05/31/14 1000  imipenem-cilastatin (PRIMAXIN) 500 mg in sodium chloride 0.9 % 100 mL IVPB     500 mg 200 mL/hr over 30 Minutes Intravenous 3 times per day 05/31/14 0911     05/30/14 1000  levofloxacin (LEVAQUIN) IVPB 750 mg  Status:  Discontinued     750 mg 100 mL/hr over 90 Minutes Intravenous Every 48 hours 05/29/14 0847 05/30/14 1027   05/29/14 1600  levofloxacin (LEVAQUIN) IVPB 750 mg  Status:  Discontinued     750 mg 100 mL/hr over 90 Minutes Intravenous Every 24 hours 05/28/14 1602 05/29/14 0847   05/29/14 0530  vancomycin (VANCOCIN) IVPB 750 mg/150 ml premix  Status:  Discontinued     750 mg 150 mL/hr over 60 Minutes Intravenous Every 12 hours 05/28/14 1815 05/31/14 0758   05/29/14 0300  aztreonam (AZACTAM) 1 g in dextrose 5 % 50 mL IVPB  Status:  Discontinued     1 g 100 mL/hr over 30 Minutes Intravenous Every 8 hours 05/28/14 2044 05/28/14 2059   05/29/14 0300  aztreonam (AZACTAM) 2 g in dextrose 5 % 50 mL IVPB  Status:  Discontinued     2 g 100 mL/hr over 30 Minutes Intravenous Every 8 hours 05/28/14 2059 05/31/14 0758   05/28/14 1835  aztreonam (AZACTAM) 1 G injection    Comments:  [name]   : cabinet override      05/28/14 1835 05/29/14 0644   05/28/14 1545  levofloxacin (LEVAQUIN) IVPB 750 mg     750 mg 100 mL/hr over 90 Minutes Intravenous  Once 05/28/14 1540 05/28/14 1735   05/28/14 1545  aztreonam (AZACTAM) 2 g in dextrose 5 % 50 mL IVPB     2 g 100 mL/hr over 30 Minutes Intravenous  Once 05/28/14 1540 05/28/14 1918   05/28/14 1545  vancomycin (VANCOCIN) IVPB 1000 mg/200 mL premix     1,000 mg 200 mL/hr over 60 Minutes Intravenous  Once 05/28/14 1540 05/28/14 1848      HPI/Subjective:  In bed, denies any headache, no chest abdominal pain. Poor historian. Moving all 4 extremities.   Objective: Filed Vitals:   06/03/14 0500  BP: 182/90  Pulse: 77  Temp: 97.3 F (36.3 C)  Resp: 22    Intake/Output Summary (Last 24 hours) at 06/03/14 1015 Last data  filed at 06/03/14 1610  Gross per 24 hour  Intake    900 ml  Output   2750 ml  Net  -1850 ml   Filed Weights   05/31/14 2100 06/01/14 2130 06/02/14 2100  Weight: 91.1 kg (200 lb 13.4 oz) 91.099 kg (200 lb 13.4 oz) 92.4 kg (203 lb 11.3 oz)    Exam:   General:  Alert, but confused at baseline   Cardiovascular: s1,s2 rrr  Respiratory: few crackles in LL  Abdomen: soft, distended, mild RUQ tender  Musculoskeletal: no LE edema   Data Reviewed: Basic Metabolic Panel:  Recent Labs Lab 05/28/14 1400 05/29/14 0248 05/30/14 0331 06/02/14 0610  NA 138 139 137 141  K 4.3  4.3 3.9 3.9  CL 97 101 101 106  CO2 22 27 25 24   GLUCOSE 326* 166* 150* 129*  BUN 13 15 14 12   CREATININE 1.10 1.28 1.15 0.92  CALCIUM 9.5 8.4 8.4 8.3*   Liver Function Tests:  Recent Labs Lab 05/28/14 1400 05/29/14 0248 05/30/14 0331 06/02/14 0610  AST 580* 476* 182* 39*  ALT 179* 239* 163* 53  ALKPHOS 361* 258* 234* 172*  BILITOT 1.9* 2.6* 1.1 0.4  PROT 6.7 5.2* 5.4* 5.4*  ALBUMIN 3.0* 2.3* 2.3* 2.2*    Recent Labs Lab 05/28/14 1504  LIPASE 27   No results found for this basename: AMMONIA,  in the last 168 hours CBC:  Recent Labs Lab 05/28/14 1400 05/29/14 0248 05/30/14 0331 06/02/14 0610  WBC 11.7* 14.5* 7.8 5.6  NEUTROABS 11.0*  --   --   --   HGB 13.8 11.5* 11.9* 12.1*  HCT 41.8 35.0* 36.3* 37.4*  MCV 93.9 95.4 95.0 96.1  PLT 163 162 164 186   Cardiac Enzymes:  Recent Labs Lab 05/28/14 1400  TROPONINI <0.30   BNP (last 3 results)  Recent Labs  05/28/14 1400  PROBNP 434.4   CBG:  Recent Labs Lab 06/02/14 0755 06/02/14 1207 06/02/14 1728 06/02/14 2119 06/03/14 0750  GLUCAP 139* 126* 152* 132* 132*    Recent Results (from the past 240 hour(s))  CULTURE, BLOOD (ROUTINE X 2)     Status: None   Collection Time    05/28/14  2:00 PM      Result Value Ref Range Status   Specimen Description BLOOD LEFT FA   Final   Special Requests BOTTLES DRAWN AEROBIC AND  ANAEROBIC [facility] EACH   Final   Culture  Setup Time     Final   Value: 05/28/2014 18:40     Performed at [facility]   Culture     Final   Value: GRAM POSITIVE COCCI IN PAIRS     Note: Gram Stain Report Called to,Read Back By and Verified With:  MICHELLE MCMILLIAN @0109  ON 06/02/14 SMIAS     Performed at [facility]   Report Status PENDING   Incomplete  CULTURE, BLOOD (ROUTINE X 2)     Status: None   Collection Time    05/28/14  2:15 PM      Result Value Ref Range Status   Specimen Description BLOOD LEFT WRIST   Final   Special Requests BOTTLES DRAWN AEROBIC AND ANAEROBIC [facility] EACH   Final   Culture  Setup Time     Final   Value: 05/28/2014 18:40     Performed at [facility]   Culture     Final   Value: ESCHERICHIA COLI     Note: Gram Stain Report Called to,Read Back By and Verified With: CAROL SCHILLER 05/29/14 @ 345PM BY RUSCOE A.     Performed at [facility]   Report Status 05/31/2014 FINAL   Final   Organism ID, Bacteria ESCHERICHIA COLI   Final  URINE CULTURE     Status: None   Collection Time    05/28/14  3:46 PM      Result Value Ref Range Status   Specimen Description URINE, CATHETERIZED   Final   Special Requests NONE   Final   Culture  Setup Time     Final   Value: 05/28/2014 18:43     Performed at [place]     Final  Value: NO GROWTH     Performed at [facility]     Final   Value: NO GROWTH     Performed at [facility]   Report Status 05/29/2014 FINAL   Final  MRSA PCR SCREENING     Status: None   Collection Time    05/28/14  8:42 PM      Result Value Ref Range Status   MRSA by PCR NEGATIVE  NEGATIVE Final   Comment:            The GeneXpert MRSA Assay (FDA     approved for NASAL specimens     only), is one component of a     comprehensive MRSA colonization     surveillance program. It is not     intended to diagnose MRSA     infection nor to guide or     monitor  treatment for     MRSA infections.  CULTURE, EXPECTORATED SPUTUM-ASSESSMENT     Status: None   Collection Time    05/30/14  7:33 AM      Result Value Ref Range Status   Specimen Description SPUTUM   Final   Special Requests NONE   Final   Sputum evaluation     Final   Value: MICROSCOPIC FINDINGS SUGGEST THAT THIS SPECIMEN IS NOT REPRESENTATIVE OF LOWER RESPIRATORY SECRETIONS. PLEASE RECOLLECT.     CALLED TO CAROL SCHILLER AT 0848 BY [facility]   Report Status 05/30/2014 FINAL   Final     Studies: No results found.  Scheduled Meds: . amLODipine  5 mg Oral Daily  . antiseptic oral rinse  7 mL Mouth Rinse BID  . aspirin  162 mg Oral Daily  . budesonide-formoterol  1 puff Inhalation BID  . donepezil  10 mg Oral QHS  . folic acid  1 mg Oral Daily  . heparin  5,000 Units Subcutaneous 3 times per day  . imipenem-cilastatin  500 mg Intravenous 3 times per day  . insulin aspart  0-15 Units Subcutaneous TID WC  . Memantine HCl ER  28 mg Oral Daily  . metoprolol  50 mg Oral BID  . pantoprazole  20 mg Oral BID  . polyethylene glycol  17 g Oral Daily  . simvastatin  20 mg Oral QPM  . sodium chloride  3 mL Intravenous Q12H  . tiotropium  18 mcg Inhalation Daily   Continuous Infusions:    Principal Problem:   Sepsis Active Problems:   DM2 (diabetes mellitus, type 2)   Hypertension   Systolic CHF, chronic   Dementia with behavioral disturbance   Acute cholangitis   Nonspecific abnormal results of liver function study   Nonspecific (abnormal) findings on radiological and other examination of biliary tract    Time spent: >35 minutes     [facility] Pager [phone]. If 7PM-7AM, please contact night-coverage at [email or web address], password [facility] 06/03/2014, 10:15 AM  LOS: 6 days

## 2014-06-03 NOTE — Progress Notes (Signed)
Physical Therapy Treatment Patient Details Name: [name] MRN: [id number] DOB: [birth year] Today's Date: 06/03/2014    History of Present Illness admitted with vomiting and diarrhea.  Found to have cholangitis with CBD stone.  Poste ERCP with stent.      PT Comments    Pt. Unsteady even with RW.  Is impulsive which wife says is his baseline.  Educated wife that he needs min assist/close supervision for mobility in the home.    Follow Up Recommendations  Home health PT;Supervision/Assistance - 24 hour;Supervision for mobility/OOB     Equipment Recommendations  Rolling walker with 5" wheels    Recommendations for Other Services       Precautions / Restrictions Precautions Precautions: Fall Precaution Comments: Pt. quite impulsive Restrictions Weight Bearing Restrictions: No    Mobility  Bed Mobility Overal bed mobility: Needs Assistance Bed Mobility: Supine to Sit     Supine to sit: Min assist     General bed mobility comments: Wife present and reaching to help pull pt. toward EOB.  Educated pt. and wife on  encouraging pt. to use UEs to move self to EOB instead of reaching.  Transfers Overall transfer level: Needs assistance Equipment used: Rolling walker (2 wheeled);None Transfers: Sit to/from Stand Sit to Stand: Min guard         General transfer comment: hand placement cues as pt. tended to reach for RW for pull up  Ambulation/Gait Ambulation/Gait assistance: Min assist Ambulation Distance (Feet): 250 Feet Assistive device: Rolling walker (2 wheeled) Gait Pattern/deviations: Step-through pattern     General Gait Details: Safety cues to slow down and for keeping RW in contact with floor during turns.  Unsteadiness noted even with RW.     Stairs            Wheelchair Mobility    Modified Rankin (Stroke Patients Only)       Balance                                    Cognition Arousal/Alertness: Awake/alert Behavior  During Therapy: WFL for tasks assessed/performed Overall Cognitive Status: Within Functional Limits for tasks assessed                      Exercises      General Comments        Pertinent Vitals/Pain Pain Assessment: No/denies pain    Home Living                      Prior Function            PT Goals (current goals can now be found in the care plan section) Progress towards PT goals: Progressing toward goals    Frequency  Min 3X/week    PT Plan Current plan remains appropriate    Co-evaluation             End of Session Equipment Utilized During Treatment: Gait belt Activity Tolerance: Patient tolerated treatment well Patient left: in chair;with call bell/phone within reach;with family/visitor present (wife present;says she will call RN beore she leaves for back to bed and that she will not be leaving pt. In room alone     Time: 2876-8115 PT Time Calculation (min): 12 min  Charges:  $Gait Training: 8-22 mins  G CodesLadona Ridgel 06/03/2014, 4:15 PM [facility] [phone] [place] [phone]

## 2014-06-04 DIAGNOSIS — B952 Enterococcus as the cause of diseases classified elsewhere: Secondary | ICD-10-CM

## 2014-06-04 DIAGNOSIS — R7881 Bacteremia: Secondary | ICD-10-CM

## 2014-06-04 DIAGNOSIS — F0390 Unspecified dementia without behavioral disturbance: Secondary | ICD-10-CM

## 2014-06-04 DIAGNOSIS — Z950 Presence of cardiac pacemaker: Secondary | ICD-10-CM

## 2014-06-04 DIAGNOSIS — A498 Other bacterial infections of unspecified site: Secondary | ICD-10-CM

## 2014-06-04 LAB — CULTURE, BLOOD (ROUTINE X 2)

## 2014-06-04 LAB — GLUCOSE, CAPILLARY
Glucose-Capillary: 147 mg/dL — ABNORMAL HIGH (ref 70–99)
Glucose-Capillary: 162 mg/dL — ABNORMAL HIGH (ref 70–99)
Glucose-Capillary: 155 mg/dL — ABNORMAL HIGH (ref 70–99)
Glucose-Capillary: 126 mg/dL — ABNORMAL HIGH (ref 70–99)

## 2014-06-04 MED ORDER — CEFUROXIME AXETIL 500 MG PO TABS
500.0000 mg | ORAL_TABLET | Freq: Two times a day (BID) | ORAL | Status: DC
  Administered 2014-06-04 – 2014-06-05 (×3): 500 mg via ORAL
  Filled 2014-06-04 (×5): qty 1

## 2014-06-04 MED ORDER — VANCOMYCIN HCL IN DEXTROSE 1-5 GM/200ML-% IV SOLN
1000.0000 mg | Freq: Two times a day (BID) | INTRAVENOUS | Status: DC
Start: 2014-06-04 — End: 2014-06-05

## 2014-06-04 MED ORDER — VANCOMYCIN HCL IN DEXTROSE 1-5 GM/200ML-% IV SOLN
1000.0000 mg | Freq: Two times a day (BID) | INTRAVENOUS | Status: DC
  Administered 2014-06-04 – 2014-06-05 (×3): 1000 mg via INTRAVENOUS
  Filled 2014-06-04 (×4): qty 200

## 2014-06-04 MED ORDER — CEFUROXIME AXETIL 500 MG PO TABS: 500.0000 mg | ORAL_TABLET | Freq: Two times a day (BID) | ORAL | Status: DC

## 2014-06-04 NOTE — Progress Notes (Signed)
ANTIBIOTIC CONSULT NOTE - INITIAL  Pharmacy Consult for Vancomycin  Indication: bacteremia  Allergies  Allergen Reactions  . Doxycycline Other (See Comments)    Unknown- patient doesn't remember  . Penicillins Rash    Patient Measurements: Height: 5\' 8"  (172.7 cm) Weight: 202 lb 13.2 oz (92 kg) IBW/kg (Calculated) : 68.4  Vital Signs: Temp: 97.5 F (36.4 C) (09/11 0920) Temp src: Oral (09/11 0920) BP: 162/86 mmHg (09/11 0920) Pulse Rate: 78 (09/11 0920) Intake/Output from previous day: 09/10 0701 - 09/11 0700 In: -  Out: 900 [Urine:900] Intake/Output from this shift:    Labs:  Recent Labs  06/02/14 0610  WBC 5.6  HGB 12.1*  PLT 186  CREATININE 0.92   Estimated Creatinine Clearance: 65.8 ml/min (by C-G formula based on Cr of 0.92).    Microbiology: Recent Results (from the past 720 hour(s))  CULTURE, BLOOD (ROUTINE X 2)     Status: None   Collection Time    05/28/14  2:00 PM      Result Value Ref Range Status   Specimen Description BLOOD LEFT FA   Final   Special Requests BOTTLES DRAWN AEROBIC AND ANAEROBIC [facility] EACH   Final   Culture  Setup Time     Final   Value: 05/28/2014 18:40     Performed at [facility]   Culture     Final   Value: ENTEROCOCCUS SPECIES     Note: Gram Stain Report Called to,Read Back By and Verified With:  MICHELLE MCMILLIAN @0109  ON 06/02/14 SMIAS     Performed at [facility]   Report Status 06/04/2014 FINAL   Final   Organism ID, Bacteria ENTEROCOCCUS SPECIES   Final  CULTURE, BLOOD (ROUTINE X 2)     Status: None   Collection Time    05/28/14  2:15 PM      Result Value Ref Range Status   Specimen Description BLOOD LEFT WRIST   Final   Special Requests BOTTLES DRAWN AEROBIC AND ANAEROBIC [facility] EACH   Final   Culture  Setup Time     Final   Value: 05/28/2014 18:40     Performed at [facility]   Culture     Final   Value: ESCHERICHIA COLI     Note: Gram Stain Report Called to,Read Back By and Verified  With: CAROL SCHILLER 05/29/14 @ 345PM BY RUSCOE A.     Performed at [facility]   Report Status 05/31/2014 FINAL   Final   Organism ID, Bacteria ESCHERICHIA COLI   Final  URINE CULTURE     Status: None   Collection Time    05/28/14  3:46 PM      Result Value Ref Range Status   Specimen Description URINE, CATHETERIZED   Final   Special Requests NONE   Final   Culture  Setup Time     Final   Value: 05/28/2014 18:43     Performed at [place]     Final   Value: NO GROWTH     Performed at [facility]   Culture     Final   Value: NO GROWTH     Performed at [facility]   Report Status 05/29/2014 FINAL   Final  MRSA PCR SCREENING     Status: None   Collection Time    05/28/14  8:42 PM      Result Value Ref Range Status   MRSA  by PCR NEGATIVE  NEGATIVE Final   Comment:            The GeneXpert MRSA Assay (FDA     approved for NASAL specimens     only), is one component of a     comprehensive MRSA colonization     surveillance program. It is not     intended to diagnose MRSA     infection nor to guide or     monitor treatment for     MRSA infections.  CULTURE, EXPECTORATED SPUTUM-ASSESSMENT     Status: None   Collection Time    05/30/14  7:33 AM      Result Value Ref Range Status   Specimen Description SPUTUM   Final   Special Requests NONE   Final   Sputum evaluation     Final   Value: MICROSCOPIC FINDINGS SUGGEST THAT THIS SPECIMEN IS NOT REPRESENTATIVE OF LOWER RESPIRATORY SECRETIONS. PLEASE RECOLLECT.     CALLED TO CAROL SCHILLER AT 0848 BY [place]   Report Status 05/30/2014 FINAL   Final  CULTURE, BLOOD (ROUTINE X 2)     Status: None   Collection Time    06/02/14 12:31 PM      Result Value Ref Range Status   Specimen Description BLOOD RIGHT HAND   Final   Special Requests BOTTLES DRAWN AEROBIC AND ANAEROBIC 10CC   Final   Culture  Setup Time     Final   Value: 06/02/2014 17:19     Performed at [facility]    Culture     Final   Value:        BLOOD CULTURE RECEIVED NO GROWTH TO DATE CULTURE WILL BE HELD FOR 5 DAYS BEFORE ISSUING A FINAL NEGATIVE REPORT     Performed at [facility]   Report Status PENDING   Incomplete  CULTURE, BLOOD (ROUTINE X 2)     Status: None   Collection Time    06/02/14 12:40 PM      Result Value Ref Range Status   Specimen Description BLOOD LEFT HAND   Final   Special Requests BOTTLES DRAWN AEROBIC ONLY 5CC   Final   Culture  Setup Time     Final   Value: 06/02/2014 17:17     Performed at [facility]   Culture     Final   Value:        BLOOD CULTURE RECEIVED NO GROWTH TO DATE CULTURE WILL BE HELD FOR 5 DAYS BEFORE ISSUING A FINAL NEGATIVE REPORT     Performed at [facility]   Report Status PENDING   Incomplete    Medical History: Past Medical History  Diagnosis Date  . Chest discomfort   . Coronary artery disease   . Diabetes mellitus   . Hypertension   . Hyperlipidemia   . Obesities, morbid   . Gallstone pancreatitis     recurrent  . TIA (transient ischemic attack)   . Bacteremia due to vancomycin resistant Enterococcus   . GERD (gastroesophageal reflux disease)   . Hearing impairment   . Myocardial infarction, anterior wall, subsequent care   . Second degree AV block, Mobitz type II     s/p PPM by JA 4/12  . ARF (acute renal failure)   . Cholangitis   . Duodenal ulcer   . GI bleed   . Anemia associated with acute blood loss   . 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 amLODipine  10 mg Oral Daily  . antiseptic oral rinse  7 mL Mouth Rinse BID  . aspirin  162 mg Oral Daily  . budesonide-formoterol  1 puff Inhalation BID  . cefUROXime  500 mg Oral BID WC  . donepezil  10 mg Oral QHS  . folic acid  1 mg Oral Daily  . heparin  5,000 Units Subcutaneous 3 times per day  . insulin aspart  0-15 Units Subcutaneous  TID WC  . Memantine HCl ER  28 mg Oral Daily  . metoprolol  50 mg Oral BID  . pantoprazole  20 mg Oral BID  . polyethylene glycol  17 g Oral Daily  . simvastatin  20 mg Oral QPM  . sodium chloride  3 mL Intravenous Q12H  . tiotropium  18 mcg Inhalation Daily   Assessment: 80 yo M on day#4 of treatment Ecoli bacteremia.  Now blood cx also growing Enterococcus.  Plans to start IV Vancomycin and place PICC line for 14d IV abx therapy.  Pt was on Vancomycin earlier in admission (9/4>>9/7) but no drug levels were obtained at that time.  SCr 0.92, CrCl ~ 65.  Goal of Therapy:  Vancomycin trough level 15-20 mcg/ml  Plan:  Begin Vancomycin 1gm IV q12h Would recommend outpt Vancomycin trough level Sunday AM (prior to 5th dose). Will also need periodic monitoring of SCr and CBC.  [employer], Pharm.D., BCPS Clinical Pharmacist Pager [phone] 06/04/2014 12:56 PM

## 2014-06-04 NOTE — Progress Notes (Signed)
[facility] PROGRESS NOTE  [name] KCL:[id number] DOB: 1927-08-28 DOA: 05/28/2014 PCP: Stephens Shire, MD  Assessment/Plan:    80 y/o male with PMH of HTN, DM, CAD, s/p PPM, COPD, dementia, h/o biliary stones s/p perc drainage and removal presents with several episodes of vomiting and some abdominal pain. Found to have cholangitis with CBD stone, post ERCP with stent placement. Much improved clinically.     1. Sepsis suspected biliary source; hemodynamically stable;  -Blood cultures: Noted, and Escherichia coli and enterococcus now. Discussed with ID physician Dr. Johnnye Sima. Will be placed on IV vancomycin for enterococcus and Ceftin for Escherichia coli. Total course 10 days for Ceftin in 14 days for vancomycin. We'll place PICC line in order home health nurse as well. Clinically afebrile with no leukocytosis. Repeat cultures drawn on 992 and 15 are negative to date.    2. Suspected cholangitis, fever+abd pain, hyper bili/alk phosphatase/alt/ast; -CT abd: persistent dilated CBD s/p  removal of percutaneous biliary stents post  biliary sphincterotomy -9/6: s/p ERCP with sphincterotomy/papillotomy and ERCP with biliary stent placement -labs improved, f/u in AM ; clinically better; cont IV atx, GI following     2. COPD, no wheezing on exam, CXR: no clear pneumonia;  -cont bronchodilators as needed     3. HTN - blood pressure is slightly high, continue beta blocker added Norvasc for better control.    4. CAD history; no active chest pain; cont monitoring on aspirin and beta blocker.    5. H/o DM not on meds; cont ISS:   CBG (last 3)   Recent Labs  06/03/14 1742 06/03/14 2104 06/04/14 0757  GLUCAP 136* 90 147*      6. Delirium in the context of sepsis, dementia -cont treat infection; supportive care , wife informed personally.     ERCP   1. There was a large periampullary diverticulum in in the distal  part of D2 which completely surrounded the  ampulla. Ampulla was  difficult to locate. It was located at the deepest point in the  diverticulum. CBD was freely cannulated with a catheter and then  with a guidewire. Positioning was difficult with the large  periampullary diverticulum in the distal part of D2. PD was not  cannulated or injected by intention.  2. Three large filling defects were seen in the distal common bile  duct-appeared to be sludge and stones.  3. There was dilation of the common bile duct measuring 17 mm down  to the level of the ampulla. Intrahepatic ducts were not dilated.  4. With guidewire in the bile duct, a small biliary sphincterotomy  was performed using the sphincterotome. I was unable to safely  extend the sphincterotomy to an adequate size due to the  diverticulum. The sphincterotomy was not large enough for balloon  extraction of the filling defects and positioning was challenging.  5. Under endoscopic and fluoroscopic guidance a 10 Fr x 7 cm double  flap stent was placed in the bile duct. Sludge, bile and contast  slowly drained from the stent. The stent was in very good position  both by fluroscopy and endoscopy.  The scope was then completely withdrawn from the patient and the  procedure terminated.  COMPLICATIONS:. There were no complications.  ENDOSCOPIC IMPRESSION:  1. Large periampullary diverticulum  2. Filling defects in the distal common bile duct  3. Dilation of the common bile duct  4. Small biliary sphincterotomy performed  5. 10 Fr x 7 cm biliary stent placed  RECOMMENDATIONS:  1. IV antibiotics  2. trend liver enzymes    PICC line ordered 06/04/2014    Code Status: DNR Family Communication: d/w wife in detail, she confirms DO NOT RESUSCITATE, wants to take him home. Left message for wife on 06/04/2014. Disposition Plan: Likely home, PT to evaluate.  DVT prophylaxis heparin.   Consultants:  GI     Anti-infectives   Start     Dose/Rate Route Frequency Ordered Stop    06/04/14 0800  cefUROXime (CEFTIN) tablet 500 mg     500 mg Oral 2 times daily with meals 06/04/14 0716     05/31/14 1000  levofloxacin (LEVAQUIN) IVPB 750 mg  Status:  Discontinued     750 mg 100 mL/hr over 90 Minutes Intravenous Every 24 hours 05/30/14 1027 05/31/14 0758   05/31/14 1000  imipenem-cilastatin (PRIMAXIN) 500 mg in sodium chloride 0.9 % 100 mL IVPB  Status:  Discontinued     500 mg 200 mL/hr over 30 Minutes Intravenous 3 times per day 05/31/14 0911 06/04/14 0716   05/30/14 1000  levofloxacin (LEVAQUIN) IVPB 750 mg  Status:  Discontinued     750 mg 100 mL/hr over 90 Minutes Intravenous Every 48 hours 05/29/14 0847 05/30/14 1027   05/29/14 1600  levofloxacin (LEVAQUIN) IVPB 750 mg  Status:  Discontinued     750 mg 100 mL/hr over 90 Minutes Intravenous Every 24 hours 05/28/14 1602 05/29/14 0847   05/29/14 0530  vancomycin (VANCOCIN) IVPB 750 mg/150 ml premix  Status:  Discontinued     750 mg 150 mL/hr over 60 Minutes Intravenous Every 12 hours 05/28/14 1815 05/31/14 0758   05/29/14 0300  aztreonam (AZACTAM) 1 g in dextrose 5 % 50 mL IVPB  Status:  Discontinued     1 g 100 mL/hr over 30 Minutes Intravenous Every 8 hours 05/28/14 2044 05/28/14 2059   05/29/14 0300  aztreonam (AZACTAM) 2 g in dextrose 5 % 50 mL IVPB  Status:  Discontinued     2 g 100 mL/hr over 30 Minutes Intravenous Every 8 hours 05/28/14 2059 05/31/14 0758   05/28/14 1835  aztreonam (AZACTAM) 1 G injection    Comments:  [name]   : cabinet override      05/28/14 1835 05/29/14 0644   05/28/14 1545  levofloxacin (LEVAQUIN) IVPB 750 mg     750 mg 100 mL/hr over 90 Minutes Intravenous  Once 05/28/14 1540 05/28/14 1735   05/28/14 1545  aztreonam (AZACTAM) 2 g in dextrose 5 % 50 mL IVPB     2 g 100 mL/hr over 30 Minutes Intravenous  Once 05/28/14 1540 05/28/14 1918   05/28/14 1545  vancomycin (VANCOCIN) IVPB 1000 mg/200 mL premix     1,000 mg 200 mL/hr over 60 Minutes Intravenous  Once 05/28/14 1540  05/28/14 1848      HPI/Subjective:  In bed, denies any headache, no chest abdominal pain. Poor historian. Moving all 4 extremities.   Objective: Filed Vitals:   06/04/14 0920  BP: 162/86  Pulse: 78  Temp: 97.5 F (36.4 C)  Resp: 19    Intake/Output Summary (Last 24 hours) at 06/04/14 1116 Last data filed at 06/03/14 1500  Gross per 24 hour  Intake      0 ml  Output    900 ml  Net   -900 ml   Filed Weights   06/01/14 2130 06/02/14 2100 06/03/14 2100  Weight: 91.099 kg (200 lb 13.4 oz) 92.4 kg (203 lb 11.3 oz) 92 kg (202 lb  13.2 oz)    Exam:   General:  Alert, but confused at baseline   Cardiovascular: s1,s2 rrr  Respiratory: few crackles in LL  Abdomen: soft, distended, mild RUQ tender  Musculoskeletal: no LE edema   Data Reviewed: Basic Metabolic Panel:  Recent Labs Lab 05/28/14 1400 05/29/14 0248 05/30/14 0331 06/02/14 0610  NA 138 139 137 141  K 4.3 4.3 3.9 3.9  CL 97 101 101 106  CO2 22 27 25 24   GLUCOSE 326* 166* 150* 129*  BUN 13 15 14 12   CREATININE 1.10 1.28 1.15 0.92  CALCIUM 9.5 8.4 8.4 8.3*   Liver Function Tests:  Recent Labs Lab 05/28/14 1400 05/29/14 0248 05/30/14 0331 06/02/14 0610  AST 580* 476* 182* 39*  ALT 179* 239* 163* 53  ALKPHOS 361* 258* 234* 172*  BILITOT 1.9* 2.6* 1.1 0.4  PROT 6.7 5.2* 5.4* 5.4*  ALBUMIN 3.0* 2.3* 2.3* 2.2*    Recent Labs Lab 05/28/14 1504  LIPASE 27   No results found for this basename: AMMONIA,  in the last 168 hours CBC:  Recent Labs Lab 05/28/14 1400 05/29/14 0248 05/30/14 0331 06/02/14 0610  WBC 11.7* 14.5* 7.8 5.6  NEUTROABS 11.0*  --   --   --   HGB 13.8 11.5* 11.9* 12.1*  HCT 41.8 35.0* 36.3* 37.4*  MCV 93.9 95.4 95.0 96.1  PLT 163 162 164 186   Cardiac Enzymes:  Recent Labs Lab 05/28/14 1400  TROPONINI <0.30   BNP (last 3 results)  Recent Labs  05/28/14 1400  PROBNP 434.4   CBG:  Recent Labs Lab 06/03/14 0750 06/03/14 1139 06/03/14 1742  06/03/14 2104 06/04/14 0757  GLUCAP 132* 151* 136* 90 147*    Recent Results (from the past 240 hour(s))  CULTURE, BLOOD (ROUTINE X 2)     Status: None   Collection Time    05/28/14  2:00 PM      Result Value Ref Range Status   Specimen Description BLOOD LEFT FA   Final   Special Requests BOTTLES DRAWN AEROBIC AND ANAEROBIC [facility] EACH   Final   Culture  Setup Time     Final   Value: 05/28/2014 18:40     Performed at [facility]   Culture     Final   Value: ENTEROCOCCUS SPECIES     Note: Gram Stain Report Called to,Read Back By and Verified With:  MICHELLE MCMILLIAN @0109  ON 06/02/14 SMIAS     Performed at [facility]   Report Status 06/04/2014 FINAL   Final   Organism ID, Bacteria ENTEROCOCCUS SPECIES   Final  CULTURE, BLOOD (ROUTINE X 2)     Status: None   Collection Time    05/28/14  2:15 PM      Result Value Ref Range Status   Specimen Description BLOOD LEFT WRIST   Final   Special Requests BOTTLES DRAWN AEROBIC AND ANAEROBIC [facility] EACH   Final   Culture  Setup Time     Final   Value: 05/28/2014 18:40     Performed at [facility]   Culture     Final   Value: ESCHERICHIA COLI     Note: Gram Stain Report Called to,Read Back By and Verified With: CAROL SCHILLER 05/29/14 @ 345PM BY RUSCOE A.     Performed at [facility]   Report Status 05/31/2014 FINAL   Final   Organism ID, Bacteria ESCHERICHIA COLI   Final  URINE CULTURE  Status: None   Collection Time    05/28/14  3:46 PM      Result Value Ref Range Status   Specimen Description URINE, CATHETERIZED   Final   Special Requests NONE   Final   Culture  Setup Time     Final   Value: 05/28/2014 18:43     Performed at [place]     Final   Value: NO GROWTH     Performed at [facility]   Culture     Final   Value: NO GROWTH     Performed at [facility]   Report Status 05/29/2014 FINAL   Final  MRSA PCR SCREENING     Status: None   Collection  Time    05/28/14  8:42 PM      Result Value Ref Range Status   MRSA by PCR NEGATIVE  NEGATIVE Final   Comment:            The GeneXpert MRSA Assay (FDA     approved for NASAL specimens     only), is one component of a     comprehensive MRSA colonization     surveillance program. It is not     intended to diagnose MRSA     infection nor to guide or     monitor treatment for     MRSA infections.  CULTURE, EXPECTORATED SPUTUM-ASSESSMENT     Status: None   Collection Time    05/30/14  7:33 AM      Result Value Ref Range Status   Specimen Description SPUTUM   Final   Special Requests NONE   Final   Sputum evaluation     Final   Value: MICROSCOPIC FINDINGS SUGGEST THAT THIS SPECIMEN IS NOT REPRESENTATIVE OF LOWER RESPIRATORY SECRETIONS. PLEASE RECOLLECT.     CALLED TO CAROL SCHILLER AT 0848 BY [place]   Report Status 05/30/2014 FINAL   Final  CULTURE, BLOOD (ROUTINE X 2)     Status: None   Collection Time    06/02/14 12:31 PM      Result Value Ref Range Status   Specimen Description BLOOD RIGHT HAND   Final   Special Requests BOTTLES DRAWN AEROBIC AND ANAEROBIC 10CC   Final   Culture  Setup Time     Final   Value: 06/02/2014 17:19     Performed at [facility]   Culture     Final   Value:        BLOOD CULTURE RECEIVED NO GROWTH TO DATE CULTURE WILL BE HELD FOR 5 DAYS BEFORE ISSUING A FINAL NEGATIVE REPORT     Performed at [facility]   Report Status PENDING   Incomplete  CULTURE, BLOOD (ROUTINE X 2)     Status: None   Collection Time    06/02/14 12:40 PM      Result Value Ref Range Status   Specimen Description BLOOD LEFT HAND   Final   Special Requests BOTTLES DRAWN AEROBIC ONLY 5CC   Final   Culture  Setup Time     Final   Value: 06/02/2014 17:17     Performed at [facility]   Culture     Final   Value:        BLOOD CULTURE RECEIVED NO GROWTH TO DATE CULTURE WILL BE HELD FOR 5 DAYS BEFORE ISSUING A FINAL NEGATIVE REPORT     Performed at [facility]  Report Status PENDING   Incomplete     Studies: No results found.  Scheduled Meds: . amLODipine  10 mg Oral Daily  . antiseptic oral rinse  7 mL Mouth Rinse BID  . aspirin  162 mg Oral Daily  . budesonide-formoterol  1 puff Inhalation BID  . cefUROXime  500 mg Oral BID WC  . donepezil  10 mg Oral QHS  . folic acid  1 mg Oral Daily  . heparin  5,000 Units Subcutaneous 3 times per day  . insulin aspart  0-15 Units Subcutaneous TID WC  . Memantine HCl ER  28 mg Oral Daily  . metoprolol  50 mg Oral BID  . pantoprazole  20 mg Oral BID  . polyethylene glycol  17 g Oral Daily  . simvastatin  20 mg Oral QPM  . sodium chloride  3 mL Intravenous Q12H  . tiotropium  18 mcg Inhalation Daily   Continuous Infusions:    Principal Problem:   Sepsis Active Problems:   DM2 (diabetes mellitus, type 2)   Hypertension   Systolic CHF, chronic   Dementia with behavioral disturbance   Acute cholangitis   Nonspecific abnormal results of liver function study   Nonspecific (abnormal) findings on radiological and other examination of biliary tract    Time spent: >35 minutes     [facility] Pager [phone]. If 7PM-7AM, please contact night-coverage at [email or web address], password [facility] 06/04/2014, 11:16 AM  LOS: 7 days

## 2014-06-04 NOTE — Consult Note (Signed)
[facility] for Infectious Disease  Date of Admission:  05/28/2014  Date of Consult:  06/04/2014  Reason for Consult: Bacteremia Referring Physician: Tivis Ringer  Impression/Recommendation Bacteremia  E coli  Enterococcus Pacemaker Dementia   Would Treat his E coli with ceftin to 9-18 Treat his enterococcus with vanco til 9-25 Palliative care eval? Consider repeat BCx after he completes his anbx  Comment- I am not clear that a full endocarditis w/u (in the face of his dementia) is indicated.   Thank you so much for this consult,   Bobby Rumpf (pager) [phone] [email or web address]  BASIR NIVEN is an 80 y.o. male.  HPI: 80 yo M with hx of dementia, drain of biliary stones on (2010/2011?) after having recurrent pancreatitis. He returned with n/v/chills and found to have temp 103.4, tachypnea and tachycardia. He was started on levaquin, vanco, aztreonam. Ct abd/pelivs were (-).  He underwent ERCP on 9-6 and had sphincterotomy, papillotomy, and stent placement.  His BCx grew 1/2 E coli. He was started on imipenem.  His course is now complicated by a BCx from admission being + for Enterococcus.   His hx is also notable for placement of permanent pacer 12-2010.   Past Medical History  Diagnosis Date  . Chest discomfort   . Coronary artery disease   . Diabetes mellitus   . Hypertension   . Hyperlipidemia   . Obesities, morbid   . Gallstone pancreatitis     recurrent  . TIA (transient ischemic attack)   . Bacteremia due to vancomycin resistant Enterococcus   . GERD (gastroesophageal reflux disease)   . Hearing impairment   . Myocardial infarction, anterior wall, subsequent care   . Second degree AV block, Mobitz type II     s/p PPM by JA 4/12  . ARF (acute renal failure)   . Cholangitis   . Duodenal ulcer   . GI bleed   . Anemia associated with acute blood loss   . 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 Dementia in  Alzheimer's disease with delirium     Past Surgical History  Procedure Laterality Date  . Cholecystectomy    . Pacemaker placement  01/01/11    implanted by JA for mobitz II AV block  . Pancreatic stent    . Ercp N/A 05/30/2014    Procedure: ENDOSCOPIC RETROGRADE CHOLANGIOPANCREATOGRAPHY (ERCP);  Surgeon: Ladene Artist, MD;  Location[facility];  Service: Endoscopy;  Laterality: N/A;     Allergies  Allergen Reactions  . Doxycycline Other (See Comments)    Unknown- patient doesn't remember  . Penicillins Rash    Medications:  Scheduled: . amLODipine  10 mg Oral Daily  . antiseptic oral rinse  7 mL Mouth Rinse BID  . aspirin  162 mg Oral Daily  . budesonide-formoterol  1 puff Inhalation BID  . cefUROXime  500 mg Oral BID WC  . donepezil  10 mg Oral QHS  . folic acid  1 mg Oral Daily  . heparin  5,000 Units Subcutaneous 3 times per day  . insulin aspart  0-15 Units Subcutaneous TID WC  . Memantine HCl ER  28 mg Oral Daily  . metoprolol  50 mg Oral BID  . pantoprazole  20 mg Oral BID  . polyethylene glycol  17 g Oral Daily  . simvastatin  20 mg Oral QPM  . sodium chloride  3 mL Intravenous Q12H  . tiotropium  18 mcg Inhalation Daily  . vancomycin  1,000 mg Intravenous BID    Abtx:  Anti-infectives   Start     Dose/Rate Route Frequency Ordered Stop   06/04/14 1300  vancomycin (VANCOCIN) IVPB 1000 mg/200 mL premix     1,000 mg 200 mL/hr over 60 Minutes Intravenous 2 times daily 06/04/14 1257     06/04/14 0800  cefUROXime (CEFTIN) tablet 500 mg     500 mg Oral 2 times daily with meals 06/04/14 0716     05/31/14 1000  levofloxacin (LEVAQUIN) IVPB 750 mg  Status:  Discontinued     750 mg 100 mL/hr over 90 Minutes Intravenous Every 24 hours 05/30/14 1027 05/31/14 0758   05/31/14 1000  imipenem-cilastatin (PRIMAXIN) 500 mg in sodium chloride 0.9 % 100 mL IVPB  Status:  Discontinued     500 mg 200 mL/hr over 30 Minutes Intravenous 3 times per day 05/31/14 0911 06/04/14 0716    05/30/14 1000  levofloxacin (LEVAQUIN) IVPB 750 mg  Status:  Discontinued     750 mg 100 mL/hr over 90 Minutes Intravenous Every 48 hours 05/29/14 0847 05/30/14 1027   05/29/14 1600  levofloxacin (LEVAQUIN) IVPB 750 mg  Status:  Discontinued     750 mg 100 mL/hr over 90 Minutes Intravenous Every 24 hours 05/28/14 1602 05/29/14 0847   05/29/14 0530  vancomycin (VANCOCIN) IVPB 750 mg/150 ml premix  Status:  Discontinued     750 mg 150 mL/hr over 60 Minutes Intravenous Every 12 hours 05/28/14 1815 05/31/14 0758   05/29/14 0300  aztreonam (AZACTAM) 1 g in dextrose 5 % 50 mL IVPB  Status:  Discontinued     1 g 100 mL/hr over 30 Minutes Intravenous Every 8 hours 05/28/14 2044 05/28/14 2059   05/29/14 0300  aztreonam (AZACTAM) 2 g in dextrose 5 % 50 mL IVPB  Status:  Discontinued     2 g 100 mL/hr over 30 Minutes Intravenous Every 8 hours 05/28/14 2059 05/31/14 0758   05/28/14 1835  aztreonam (AZACTAM) 1 G injection    Comments:  [name]   : cabinet override      05/28/14 1835 05/29/14 0644   05/28/14 1545  levofloxacin (LEVAQUIN) IVPB 750 mg     750 mg 100 mL/hr over 90 Minutes Intravenous  Once 05/28/14 1540 05/28/14 1735   05/28/14 1545  aztreonam (AZACTAM) 2 g in dextrose 5 % 50 mL IVPB     2 g 100 mL/hr over 30 Minutes Intravenous  Once 05/28/14 1540 05/28/14 1918   05/28/14 1545  vancomycin (VANCOCIN) IVPB 1000 mg/200 mL premix     1,000 mg 200 mL/hr over 60 Minutes Intravenous  Once 05/28/14 1540 05/28/14 1848      Total days of antibiotics  9-4 vanco 9-7 9-11  "     " 9-11 Ceftin 9-7 Imipenem 9-11 9-4 Aztreonam 9-7  9-4 levoflox 9-7            Social History:  reports that he quit smoking about 18 years ago. His smoking use included Cigarettes. He has a 57 pack-year smoking history. He has never used smokeless tobacco. He reports that he does not drink alcohol or use illicit drugs.  Family History  Problem Relation Age of Onset  . Hypertension Other      General ROS: unobtainable, pt only grunts.   Blood pressure 162/86, pulse 78, temperature 97.5 F (36.4 C), temperature source Oral, resp. rate 19, height 5\' 8"  (1.727 m), weight 92 kg (202 lb 13.2 oz), SpO2 96.00%. General appearance:  alert, no distress, pale and slowed mentation Eyes: negative findings: pupils equal, round, reactive to light and accomodation Throat: normal findings: oropharynx pink & moist without lesions or evidence of thrush Neck: no adenopathy and supple, symmetrical, trachea midline Lungs: clear to auscultation bilaterally Chest wall: no tenderness, L upper chest pacer is non-tender, no fluctuance.  Heart: regular rate and rhythm Abdomen: normal findings: bowel sounds normal and soft, non-tender Extremities: edema trace   Results for orders placed during the hospital encounter of 05/28/14 (from the past 48 hour(s))  GLUCOSE, CAPILLARY     Status: Abnormal   Collection Time    06/02/14  5:28 PM      Result Value Ref Range   Glucose-Capillary 152 (*) 70 - 99 mg/dL  GLUCOSE, CAPILLARY     Status: Abnormal   Collection Time    06/02/14  9:19 PM      Result Value Ref Range   Glucose-Capillary 132 (*) 70 - 99 mg/dL  GLUCOSE, CAPILLARY     Status: Abnormal   Collection Time    06/03/14  7:50 AM      Result Value Ref Range   Glucose-Capillary 132 (*) 70 - 99 mg/dL  GLUCOSE, CAPILLARY     Status: Abnormal   Collection Time    06/03/14 11:39 AM      Result Value Ref Range   Glucose-Capillary 151 (*) 70 - 99 mg/dL  GLUCOSE, CAPILLARY     Status: Abnormal   Collection Time    06/03/14  5:42 PM      Result Value Ref Range   Glucose-Capillary 136 (*) 70 - 99 mg/dL  GLUCOSE, CAPILLARY     Status: None   Collection Time    06/03/14  9:04 PM      Result Value Ref Range   Glucose-Capillary 90  70 - 99 mg/dL  GLUCOSE, CAPILLARY     Status: Abnormal   Collection Time    06/04/14  7:57 AM      Result Value Ref Range   Glucose-Capillary 147 (*) 70 - 99 mg/dL   GLUCOSE, CAPILLARY     Status: Abnormal   Collection Time    06/04/14 12:05 PM      Result Value Ref Range   Glucose-Capillary 162 (*) 70 - 99 mg/dL      Component Value Date/Time   SDES BLOOD LEFT HAND 06/02/2014 1240   SPECREQUEST BOTTLES DRAWN AEROBIC ONLY 5CC 06/02/2014 1240   CULT  Value:        BLOOD CULTURE RECEIVED NO GROWTH TO DATE CULTURE WILL BE HELD FOR 5 DAYS BEFORE ISSUING A FINAL NEGATIVE REPORT Performed at [facility] 06/02/2014 1240   REPTSTATUS PENDING 06/02/2014 1240   No results found. Recent Results (from the past 240 hour(s))  CULTURE, BLOOD (ROUTINE X 2)     Status: None   Collection Time    05/28/14  2:00 PM      Result Value Ref Range Status   Specimen Description BLOOD LEFT FA   Final   Special Requests BOTTLES DRAWN AEROBIC AND ANAEROBIC [facility] EACH   Final   Culture  Setup Time     Final   Value: 05/28/2014 18:40     Performed at [facility]   Culture     Final   Value: ENTEROCOCCUS SPECIES     Note: Gram Stain Report Called to,Read Back By and Verified With:  MICHELLE MCMILLIAN @0109  ON 06/02/14 SMIAS     Performed at  [facility]   Report Status 06/04/2014 FINAL   Final   Organism ID, Bacteria ENTEROCOCCUS SPECIES   Final  CULTURE, BLOOD (ROUTINE X 2)     Status: None   Collection Time    05/28/14  2:15 PM      Result Value Ref Range Status   Specimen Description BLOOD LEFT WRIST   Final   Special Requests BOTTLES DRAWN AEROBIC AND ANAEROBIC [facility] EACH   Final   Culture  Setup Time     Final   Value: 05/28/2014 18:40     Performed at [facility]   Culture     Final   Value: ESCHERICHIA COLI     Note: Gram Stain Report Called to,Read Back By and Verified With: CAROL SCHILLER 05/29/14 @ 345PM BY RUSCOE A.     Performed at [facility]   Report Status 05/31/2014 FINAL   Final   Organism ID, Bacteria ESCHERICHIA COLI   Final  URINE CULTURE     Status: None   Collection Time    05/28/14  3:46 PM      Result Value  Ref Range Status   Specimen Description URINE, CATHETERIZED   Final   Special Requests NONE   Final   Culture  Setup Time     Final   Value: 05/28/2014 18:43     Performed at [place]     Final   Value: NO GROWTH     Performed at [facility]   Culture     Final   Value: NO GROWTH     Performed at [facility]   Report Status 05/29/2014 FINAL   Final  MRSA PCR SCREENING     Status: None   Collection Time    05/28/14  8:42 PM      Result Value Ref Range Status   MRSA by PCR NEGATIVE  NEGATIVE Final   Comment:            The GeneXpert MRSA Assay (FDA     approved for NASAL specimens     only), is one component of a     comprehensive MRSA colonization     surveillance program. It is not     intended to diagnose MRSA     infection nor to guide or     monitor treatment for     MRSA infections.  CULTURE, EXPECTORATED SPUTUM-ASSESSMENT     Status: None   Collection Time    05/30/14  7:33 AM      Result Value Ref Range Status   Specimen Description SPUTUM   Final   Special Requests NONE   Final   Sputum evaluation     Final   Value: MICROSCOPIC FINDINGS SUGGEST THAT THIS SPECIMEN IS NOT REPRESENTATIVE OF LOWER RESPIRATORY SECRETIONS. PLEASE RECOLLECT.     CALLED TO CAROL SCHILLER AT 0848 BY [place]   Report Status 05/30/2014 FINAL   Final  CULTURE, BLOOD (ROUTINE X 2)     Status: None   Collection Time    06/02/14 12:31 PM      Result Value Ref Range Status   Specimen Description BLOOD RIGHT HAND   Final   Special Requests BOTTLES DRAWN AEROBIC AND ANAEROBIC 10CC   Final   Culture  Setup Time     Final   Value: 06/02/2014 17:19     Performed at [facility]  Final   Value:        BLOOD CULTURE RECEIVED NO GROWTH TO DATE CULTURE WILL BE HELD FOR 5 DAYS BEFORE ISSUING A FINAL NEGATIVE REPORT     Performed at [facility]   Report Status PENDING   Incomplete  CULTURE, BLOOD (ROUTINE X 2)     Status: None    Collection Time    06/02/14 12:40 PM      Result Value Ref Range Status   Specimen Description BLOOD LEFT HAND   Final   Special Requests BOTTLES DRAWN AEROBIC ONLY 5CC   Final   Culture  Setup Time     Final   Value: 06/02/2014 17:17     Performed at [facility]   Culture     Final   Value:        BLOOD CULTURE RECEIVED NO GROWTH TO DATE CULTURE WILL BE HELD FOR 5 DAYS BEFORE ISSUING A FINAL NEGATIVE REPORT     Performed at [facility]   Report Status PENDING   Incomplete      06/04/2014, 3:09 PM     LOS: 7 days

## 2014-06-04 NOTE — Progress Notes (Addendum)
Physical Therapy Treatment Patient Details Name: [name] MRN: [id number] DOB: October 01, 1926 Today's Date: 06/04/2014    History of Present Illness admitted with vomiting and diarrhea.  Found to have cholangitis with CBD stone.  [place] ERCP with stent.      PT Comments    Pt. With improving stability today with RW and cueing to slow his pace.  He needs 24 hour assist and close contact from family for all mobility.  Wife not present today during session.  I reiterated this to the patient.  I would recommend that the nursing staff ambulate patient multiple times today and over the weekend.    Follow Up Recommendations  Home health PT;Supervision/Assistance - 24 hour;Supervision for mobility/OOB     Equipment Recommendations  Rolling walker with 5" wheels    Recommendations for Other Services       Precautions / Restrictions Precautions Precautions: Fall Precaution Comments: impulsive Restrictions Weight Bearing Restrictions: No    Mobility  Bed Mobility Overal bed mobility: Needs Assistance Bed Mobility: Supine to Sit     Supine to sit: Supervision;HOB elevated     General bed mobility comments: pt. needed use of bed rails and HOB up but complete without physical assist  Transfers Overall transfer level: Needs assistance Equipment used: Rolling walker (2 wheeled);None Transfers: Sit to/from Stand Sit to Stand: Min guard         General transfer comment: hand placement and safety cues  Ambulation/Gait Ambulation/Gait assistance: Min assist Ambulation Distance (Feet): 200 Feet Assistive device: Rolling walker (2 wheeled) Gait Pattern/deviations: Step-through pattern;Wide base of support;Trunk flexed     General Gait Details: Pt. given cues before walk to take a slower pace than his ususal for increased safety.  Pt did maintain a slower pace and was overall safer during walk with RW than yesterday.  Needed technique cueing to keep RW on floor as he made turns.   He tends to pick RW up for turning   Stairs            Wheelchair Mobility    Modified Rankin (Stroke Patients Only)       Balance                                    Cognition Arousal/Alertness: Awake/alert Behavior During Therapy: WFL for tasks assessed/performed Overall Cognitive Status: Within Functional Limits for tasks assessed                      Exercises      General Comments        Pertinent Vitals/Pain Pain Assessment: No/denies pain; post ambulation sats 93% on RA thought pt. With 2/4 dyspnea.  He says he feels he is breathing "just fine"    Home Living                      Prior Function            PT Goals (current goals can now be found in the care plan section) Progress towards PT goals: Progressing toward goals    Frequency  Min 3X/week    PT Plan Current plan remains appropriate    Co-evaluation             End of Session Equipment Utilized During Treatment: Gait belt Activity Tolerance: Patient tolerated treatment well Patient left: in bed;with call bell/phone within reach;with bed  alarm set     Time: 1015-1030 PT Time Calculation (min): 15 min  Charges:  $Gait Training: 8-22 mins                    G Codes:      Ladona Ridgel 06/04/2014, 10:45 AM Gerlean Ren PT Acute Rehab Services [phone] [place] [phone]

## 2014-06-04 NOTE — Progress Notes (Signed)
Dr. Candiss Norse called and pt had positive blood cultures.  Picc Line placement today and home tomorrow for 14 days of IV vancomycin.

## 2014-06-04 NOTE — Progress Notes (Signed)
CARE MANAGEMENT NOTE 06/04/2014  Patient:  [name], [name]   Account Number:  [id number]  Date Initiated:  06/01/2014  Documentation initiated by:  ROYAL,CHERYL  Subjective/Objective Assessment:   CM following for progession and d/c planning.     Action/Plan:   06/01/14 Met with pt and IM explained, left on bedside table.  06/02/2014 Met with pt wife re d/c plans, pt will d/c to home she selected [facility] for [facility] no PT, has DME. IM given to wife.   Anticipated DC Date:  06/04/2014   Anticipated DC Plan:  [place]  CM consult      [facility] Choice  HOME HEALTH   Choice offered to / List presented to:  C-3 Spouse        HH arranged  HH-1 RN  IV Antibiotics      [place].   Status of service:  Completed, signed off Medicare Important Message given?  YES (If response is "NO", the following Medicare IM given date fields will be blank) Date Medicare IM given:  06/01/2014 Medicare IM given by:  ROYAL,CHERYL Date Additional Medicare IM given:  06/02/2014 Additional Medicare IM given by:  [facility]  Discharge Disposition:  [place]  Per UR Regulation:    If discussed at Long Length of Stay Meetings, dates discussed:   06/03/2014    Comments:  06/04/2014 1600 [facility] has IV abx Rx for dc tomorrow. Waiting PICC line placement. Spoke to pt's wife [name] # [phone] or cell # [phone]. Pt has RW, cane and elevated commode seat at home. No DME requested by wife. Jonnie Finner RN CCM Case Mgmt phone [phone]  06/04/2014 1200 [facility] notified [facility] for dc home with IV abx. Jonnie Finner RN CCM Case Mgmt phone [phone]  06/02/2014 Pt has DME per wife, plan for d/c to home, has used [facility] previously and wishes to use them again. [place] notified of need for [facility], wife declined HHPT as pt has had previously and was not receptive. Will continue to follow. Jasmine Pang RN MPH, case manager, [phone]

## 2014-06-05 LAB — GLUCOSE, CAPILLARY: Glucose-Capillary: 148 mg/dL — ABNORMAL HIGH (ref 70–99)

## 2014-06-05 MED ORDER — SODIUM CHLORIDE 0.9 % IJ SOLN: 10.0000 mL | Freq: Two times a day (BID) | INTRAMUSCULAR | Status: DC

## 2014-06-05 MED ORDER — HEPARIN SOD (PORK) LOCK FLUSH 100 UNIT/ML IV SOLN
250.0000 [IU] | INTRAVENOUS | Status: DC | PRN
  Administered 2014-06-05: 250 [IU]
  Filled 2014-06-05: qty 3

## 2014-06-05 MED ORDER — HEPARIN SOD (PORK) LOCK FLUSH 100 UNIT/ML IV SOLN
250.0000 [IU] | Freq: Every day | INTRAVENOUS | Status: DC
  Filled 2014-06-05: qty 3

## 2014-06-05 MED ORDER — AMLODIPINE BESYLATE 10 MG PO TABS: 10.0000 mg | ORAL_TABLET | Freq: Every day | ORAL | Status: DC

## 2014-06-05 MED ORDER — SODIUM CHLORIDE 0.9 % IJ SOLN
10.0000 mL | INTRAMUSCULAR | Status: DC | PRN
  Administered 2014-06-05: 10 mL

## 2014-06-05 MED ORDER — VANCOMYCIN HCL IN DEXTROSE 1-5 GM/200ML-% IV SOLN: INTRAVENOUS | Status: DC

## 2014-06-05 MED ORDER — FUROSEMIDE 10 MG/ML IJ SOLN
20.0000 mg | Freq: Once | INTRAMUSCULAR | Status: DC
  Filled 2014-06-05: qty 2

## 2014-06-05 MED ORDER — SODIUM CHLORIDE 0.9 % IJ SOLN: 10.0000 mL | INTRAMUSCULAR | Status: DC | PRN

## 2014-06-05 NOTE — Care Management Note (Signed)
    Page 1 of 2   06/05/2014     5:25:31 PM CARE MANAGEMENT NOTE 06/05/2014  Patient:  [name], [name]   Account Number:  [id number]  Date Initiated:  06/01/2014  Documentation initiated by:  ROYAL,CHERYL  Subjective/Objective Assessment:   CM following for progession and d/c planning.     Action/Plan:   06/01/14 Met with pt and IM explained, left on bedside table.  06/02/2014 Met with pt wife re d/c plans, pt will d/c to home she selected [facility] for [facility] no PT, has DME. IM given to wife.   Anticipated DC Date:  06/04/2014   Anticipated DC Plan:  [place]  CM consult      [facility] Choice  HOME HEALTH   Choice offered to / List presented to:  C-3 Spouse        HH arranged  HH-1 RN  IV Antibiotics      [place].   Status of service:  Completed, signed off Medicare Important Message given?  YES (If response is "NO", the following Medicare IM given date fields will be blank) Date Medicare IM given:  06/01/2014 Medicare IM given by:  ROYAL,CHERYL Date Additional Medicare IM given:  06/02/2014 Additional Medicare IM given by:  [facility]  Discharge Disposition:  [place]  Per UR Regulation:    If discussed at Long Length of Stay Meetings, dates discussed:   06/03/2014    Comments:  06/05/14 11;40 CM spoke with [facility] rep, Kristen to confirm all was in place and [place] states they have all they need. No other CM needs were communicated.  Mariane Masters, BSN, [place].  06/04/2014 1600 [facility] has IV abx Rx for dc tomorrow. Waiting PICC line placement. Spoke to pt's wife [name] # [phone] or cell # [phone]. Pt has RW, cane and elevated commode seat at home. No DME requested by wife. Jonnie Finner RN CCM Case Mgmt phone [phone]  06/04/2014 1200 NCM notified [facility] for dc home with IV abx. Jonnie Finner RN CCM Case Mgmt phone [phone]  06/02/2014 Pt has DME per wife, plan for d/c  to home, has used [facility] previously and wishes to use them again. [place] notified of need for [facility], wife declined HHPT as pt has had previously and was not receptive. Will continue to follow. Jasmine Pang RN MPH, case manager, [phone]

## 2014-06-05 NOTE — Progress Notes (Signed)
Discharge instruction discussed with wife.   Prescriptions given with discharge summary.  Wife verbalized understanding of change in vancomycin dose to twice daily.  All questions answered.

## 2014-06-05 NOTE — Discharge Summary (Signed)
[name], is a 80 y.o. male  DOB 01-Mar-1927  MRN [id number].  Admission date:  05/28/2014  Admitting Physician  Charlynne Cousins, MD  Discharge Date:  06/05/2014   Primary MD  Stephens Shire, MD  Recommendations for primary care physician for things to follow:   Check CBC, CMP and vancomycin levels closely. He must follow with GI and ID on a close basis.   Admission Diagnosis  Sepsis, due to unspecified organism [038.9, 995.91]   Discharge Diagnosis  Sepsis, due to unspecified organism [038.9, 995.91]    Principal Problem:   Sepsis Active Problems:   DM2 (diabetes mellitus, type 2)   Hypertension   Systolic CHF, chronic   Dementia with behavioral disturbance   Acute cholangitis   Nonspecific abnormal results of liver function study   Nonspecific (abnormal) findings on radiological and other examination of biliary tract      Past Medical History  Diagnosis Date  . Chest discomfort   . Coronary artery disease   . Diabetes mellitus   . Hypertension   . Hyperlipidemia   . Obesities, morbid   . Gallstone pancreatitis     recurrent  . TIA (transient ischemic attack)   . Bacteremia due to vancomycin resistant Enterococcus   . GERD (gastroesophageal reflux disease)   . Hearing impairment   . Myocardial infarction, anterior wall, subsequent care   . Second degree AV block, Mobitz type II     s/p PPM by JA 4/12  . ARF (acute renal failure)   . Cholangitis   . Duodenal ulcer   . GI bleed   . Anemia associated with acute blood loss   . 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 Dementia in Alzheimer's disease with delirium     Past Surgical History  Procedure Laterality Date  . Cholecystectomy    . Pacemaker placement  01/01/11    implanted by JA for mobitz II AV block  .  Pancreatic stent    . Ercp N/A 05/30/2014    Procedure: ENDOSCOPIC RETROGRADE CHOLANGIOPANCREATOGRAPHY (ERCP);  Surgeon: Ladene Artist, MD;  Location: [facility] ENDOSCOPY;  Service: Endoscopy;  Laterality: N/A;       History of present illness and  Hospital Course:     Kindly see H&P for history of present illness and admission details, please review complete Labs, Consult reports and Test reports for all details in brief  HPI     80 y/o male with PMH of HTN, DM, CAD, s/p PPM, COPD, dementia, h/o biliary stones s/p perc drainage and removal presents with several episodes of vomiting and some abdominal pain. Found to have cholangitis with CBD stone, post ERCP with stent placement. Much improved clinically.    Hospital Course    1. Sepsis suspected biliary source; hemodynamically stable;  -Blood cultures: Noted, and Escherichia coli and enterococcus now. Consulted ID physician Dr. Johnnye Sima. Will be placed on IV vancomycin for enterococcus and Ceftin for Escherichia coli. Total course 10 days for Ceftin and  14 days for vancomycin.   C. right arm PICC line, home health nursing arranged, we'll request home health nurse to draw vancomycin levels and forwarded to home health pharmacy, PCP and Dr. Johnnye Sima ID.     2. Suspected cholangitis, fever+abd pain, hyper bili/alk phosphatase/alt/ast;  -CT abd: persistent dilated CBD s/p removal of percutaneous biliary stents post biliary sphincterotomy  -9/6: s/p ERCP with sphincterotomy/papillotomy and ERCP with biliary stent placement  -labs improved, antibiotics as above will follow with PCP and GI outpatient post discharge.    2. COPD, no wheezing on exam, CXR: no clear pneumonia;  -cont bronchodilators as needed    3. HTN - none comminution of beta blocker and Norvasc .  4. CAD history; no active chest pain; cont monitoring on aspirin and beta blocker.    5. H/o DM2 not on meds; that control continue at home, follow with PCP   6. Delirium  in the context of sepsis, dementia  -cont treat infection; supportive care , wife informed personally.    Discharge Condition: stable   Follow UP  Follow-up Information   Follow up with [place] RN, Physical Therapy and aide)    Contact information:   [address] [place] [address] [phone]       Follow up with BURNETT,BRENT A, MD. Schedule an appointment as soon as possible for a visit in 3 days.   Specialty:  Family Medicine   Contact information:   [place] [address] [phone]       Follow up with Bobby Rumpf, MD. Schedule an appointment as soon as possible for a visit in 1 week.   Specialty:  Infectious Diseases   Contact information:   [address]  [place] [address] [phone]       Follow up with Norberto Sorenson T. Fuller Plan, MD. Schedule an appointment as soon as possible for a visit in 1 week.   Specialty:  Gastroenterology   Contact information:   [address] [address] [phone]         Discharge Instructions  and  Discharge Medications      Discharge Instructions   Discharge instructions    Complete by:  As directed   Total 13 more days, home RN to draw vancomycin levels forwarded to PCP and Dr. Johnnye Sima infectious disease. Home health to monitor vancomycin levels.     Increase activity slowly    Complete by:  As directed             Medication List    STOP taking these medications       ipratropium-albuterol 0.5-2.5 (3) MG/3ML Soln  Commonly known as:  DUONEB      TAKE these medications       albuterol 108 (90 BASE) MCG/ACT inhaler  Commonly known as:  PROVENTIL HFA;VENTOLIN HFA  Inhale 2 puffs into the lungs every 4 (four) hours as needed. For shortness of breath     amLODipine 10 MG tablet  Commonly known as:  NORVASC  Take 1 tablet (10 mg total) by mouth daily.     aspirin 325 MG tablet  Take 162.5 mg by mouth  daily.     budesonide-formoterol 160-4.5 MCG/ACT inhaler  Commonly known as:  SYMBICORT  Inhale 1 puff into the lungs 2 (two) times daily.     cefUROXime 500 MG tablet  Commonly known as:  CEFTIN  Take 1 tablet (500  mg total) by mouth 2 (two) times daily with a meal.     CENTRUM SILVER PO  Take 1 tablet by mouth every morning.     donepezil 10 MG tablet  Commonly known as:  ARICEPT  Take 1 tablet (10 mg total) by mouth at bedtime.     FISH OIL PO  Take 2 capsules by mouth daily.     folic acid 1 MG tablet  Commonly known as:  FOLVITE  Take 1 mg by mouth daily.     hydrochlorothiazide 25 MG tablet  Commonly known as:  HYDRODIURIL  Take 12.5 mg by mouth daily.     losartan 50 MG tablet  Commonly known as:  COZAAR  Take 1 tablet (50 mg total) by mouth daily.     MAGNESIUM SULFATE PO  Take 1 tablet by mouth daily.     metoprolol 50 MG tablet  Commonly known as:  LOPRESSOR  Take 50 mg by mouth 2 (two) times daily.     NAMENDA XR 28 MG Cp24  Generic drug:  Memantine HCl ER  Take 28 mg by mouth daily.     omeprazole 20 MG tablet  Commonly known as:  PRILOSEC OTC  Take 20 mg by mouth 2 (two) times daily.     polyethylene glycol packet  Commonly known as:  MIRALAX / GLYCOLAX  Take 17 g by mouth daily.     simvastatin 20 MG tablet  Commonly known as:  ZOCOR  Take 20 mg by mouth every evening.     SYSTANE 0.4-0.3 % Soln  Generic drug:  Polyethyl Glycol-Propyl Glycol  Apply 1 drop to eye daily as needed (for redness).     tiotropium 18 MCG inhalation capsule  Commonly known as:  SPIRIVA  Place 18 mcg into inhaler and inhale daily.     vancomycin 1 GM/200ML Soln  Commonly known as:  VANCOCIN  Total 13 more days, home RN to draw vancomycin levels forwarded to PCP and Dr. Johnnye Sima infectious disease. Home health to monitor vancomycin levels.          Diet and Activity recommendation: See Discharge Instructions above   Consults obtained - ID, GI  Major  procedures and Radiology Reports - PLEASE review detailed and final reports for all details, in brief -     R arm PICC   ERCP  1. There was a large periampullary diverticulum in in the distal  part of D2 which completely surrounded the ampulla. Ampulla was  difficult to locate. It was located at the deepest point in the  diverticulum. CBD was freely cannulated with a catheter and then  with a guidewire. Positioning was difficult with the large  periampullary diverticulum in the distal part of D2. PD was not  cannulated or injected by intention.  2. Three large filling defects were seen in the distal common bile  duct-appeared to be sludge and stones.  3. There was dilation of the common bile duct measuring 17 mm down  to the level of the ampulla. Intrahepatic ducts were not dilated.  4. With guidewire in the bile duct, a small biliary sphincterotomy  was performed using the sphincterotome. I was unable to safely  extend the sphincterotomy to an adequate size due to the  diverticulum. The sphincterotomy was not large enough for balloon  extraction of the filling defects and positioning was challenging.  5. Under endoscopic and fluoroscopic guidance a 10 Fr x 7 cm double  flap stent was placed in  the bile duct. Sludge, bile and contast  slowly drained from the stent. The stent was in very good position  both by fluroscopy and endoscopy.  The scope was then completely withdrawn from the patient and the  procedure terminated.  COMPLICATIONS:. There were no complications.  ENDOSCOPIC IMPRESSION:  1. Large periampullary diverticulum  2. Filling defects in the distal common bile duct  3. Dilation of the common bile duct  4. Small biliary sphincterotomy performed  5. 10 Fr x 7 cm biliary stent placed  RECOMMENDATIONS:  1. IV antibiotics  2. trend liver enzymes      Ct Abdomen Pelvis W Contrast  05/28/2014   CLINICAL DATA:  Shortness of breath and upper abdominal pain after mowing  lawn earlier this morning.  EXAM: CT ABDOMEN AND PELVIS WITH CONTRAST  TECHNIQUE: Multidetector CT imaging of the abdomen and pelvis was performed using the standard protocol following bolus administration of intravenous contrast.  CONTRAST:  4mL OMNIPAQUE IOHEXOL 300 MG/ML SOLN, 134mL OMNIPAQUE IOHEXOL 300 MG/ML SOLN  COMPARISON:  CT the abdomen pelvis - 04/19/2011; CT of the chest, abdomen pelvis - 08/26/2009; fluoroscopic guided biliary stent placement - 05/25/2011  FINDINGS: Normal hepatic contour. No discrete hepatic lesions. There has been removal of previously placed percutaneous biliary stent. Pneumobilia is seen within the persistently dilated common bile duct as well as the nondependent portions of the biliary tree of the left lobe of the liver. Again, the common bile duct appears enlarged measuring approximately 1.7 cm and maximal coronal dimension (image 51, series 5), similar to the 2010 examination. Post cholecystectomy. No ascites.  There is symmetric enhancement and excretion of the bilateral kidneys. Bilateral sub cm hypo attenuating renal lesions too small to actually characterize of favored to represent renal cysts. No definite renal stones this postcontrast examination. There is a minimal amount of grossly symmetric age and body habitus related perinephric stranding. No urinary obstruction. Normal appearance of the bilateral adrenal glands, pancreas and spleen.  Ingested enteric contrast extends to the level of the mid/distal small bowel. No evidence of enteric obstruction. Normal appearance of the appendix. Moderate colonic stool burden without evidence of obstruction. Scattered minimal colonic diverticulosis without evidence of diverticulitis. No pneumoperitoneum, pneumatosis or portal venous gas.  Scattered mixed calcified and noncalcified atherosclerotic plaque throughout the abdominal aorta. There is a minimal amounts of bilobed ectasia involving the infrarenal abdominal aorta with dominant  cranial component measuring approximately 2.9 cm in maximal coronal diameter (coronal image 58, series 5), grossly unchanged since the 2010 examination. The major branch vessels of the abdominal aorta appear patent on this non CTA examination.  No retroperitoneal, mesenteric, pelvic or inguinal lymphadenopathy.  Prostatic calcifications. Normal appearance of the urinary bladder given degree distention. No free fluid within the pelvis.  Limited visualization of the lower thorax demonstrates an approximately 4 mm nodule within the right lower lobe (image 4, series 4) which is unchanged since the 2010 examination and thus of benign etiology. There is minimal subsegmental atelectasis within the imaged caudal aspects of the right middle lobe and lingula. There is minimal dependent subsegmental atelectasis within the bilateral lower lobes. No discrete focal airspace opacities. No pleural effusion.  Cardiomegaly. Unchanged trace amount of pericardial fluid. Pacemaker leads are seen within the right atrium and ventricle. Coronary artery calcifications. Calcifications with the aortic valve leaflets are suspected though incompletely evaluated.  No acute or aggressive osseus abnormalities. Moderate multilevel lumbar spine DDD, worse at L3-L4 and L4-L5 with disc space height loss, endplate irregularity  and sclerosis. Mild degenerative change of the bilateral hips, right greater than left.  Moderate-sized left-sided mixed direct and indirect mesenteric fat containing inguinal hernia. Regional soft tissues appear otherwise normal.  IMPRESSION: 1. No definite explanation for patient's upper abdominal pain and shortness of breath. 2. Post removal of percutaneous biliary stents with persistent dilatation of the common bile duct measuring approximately 1.7 cm in diameter, similar to prior examinations. Expected pneumobilia post biliary sphincterotomy. 3. Minimal colonic diverticulosis without evidence of diverticulitis. 4. Coronary  artery calcifications. 5. Cardiomegaly. Small amount of pericardial fluid, unchanged since the 2010 examination and less presumably physiologic in etiology. 6. Unchanged ectasia of the infrarenal abdominal aorta measuring approximately 2.9 cm in diameter, stable since the 2010 examination. Recommend follow up by [place] in 5 years. This recommendation follows ACR consensus guidelines: White Paper of the ACR Incidental Findings Committee II on Vascular Findings. J Am Coll Radiol 2013; 10:789-794.   Electronically Signed   By: Simonne Come M.D.   On: 05/28/2014 16:12   Dg Chest Portable 1 View  05/28/2014   CLINICAL DATA:  Shortness of breath and left-sided chest pain following exertion; history of coronary artery disease and diabetes  EXAM: PORTABLE CHEST - 1 VIEW  COMPARISON:  Portable chest x-ray dated October 29, 2012  FINDINGS: The lungs are well-expanded. The interstitial markings are coarse. The cardiopericardial silhouette is enlarged. The pulmonary vascularity is mildly prominent. The permanent pacemaker is unchanged in position. The bony thorax exhibits no acute abnormalities.  IMPRESSION: COPD with low-grade CHF. There is no focal pneumonia nor alveolar edema.   Electronically Signed   By: David  [place]   On: 05/28/2014 14:16   Dg Ercp Biliary & Pancreatic Ducts  05/30/2014   CLINICAL DATA:  Right-sided abdominal pain.  Sepsis.  Cholangitis.  EXAM: ERCP  TECHNIQUE: Multiple spot images obtained with the fluoroscopic device and submitted for interpretation post-procedure.  COMPARISON:  CT abdomen pelvis -05/28/2014  FINDINGS: Thirteen spot intraoperative fluoroscopic images during ERCP are provided for review.  Initial image demonstrates an ERCP probe overlying the right upper abdominal quadrant. Surgical clips overlie the expected location of the gallbladder fossa.  Subsequent images demonstrate selective cannulation and opacification of the common bile duct. The common bile duct appears markedly distended.  There are failed persistent filling defects within distal aspect of the CBD (representative image 8).  Completion image demonstrates placement of plastic internal biliary stent overlying the distal CBD with subsequent decompression of the more central common bile duct.  There is very minimal opacification of the central aspect of the intrahepatic biliary system which appears nondilated. There is no definitive opacification of the pancreatic or residual cystic duct.  IMPRESSION: ERCP with findings of persistent ill-defined filling defects within a markedly dilated common bile duct worrisome for choledocholithiasis and/or gallbladder sludge. Post biliary stent placement.  These images were submitted for radiologic interpretation only. Please see the procedural report for the amount of contrast and the fluoroscopy time utilized.   Electronically Signed   By: Simonne Come M.D.   On: 05/30/2014 12:38   US Abdomen Limited Ruq  05/29/2014   CLINICAL DATA:  Abdominal pain/ vomiting, status post cholecystectomy  EXAM: US ABDOMEN LIMITED - RIGHT UPPER QUADRANT  COMPARISON:  CT abdomen pelvis dated 05/28/2014  FINDINGS: Gallbladder:  Surgically absent.  Common bile duct:  Diameter: 14 mm.  Liver:  Coarse hepatic echotexture with increased parenchymal echogenicity, likely reflecting hepatic steatosis. Pneumobilia.  IMPRESSION: Status post cholecystectomy.  Common duct measures  14 mm, unchanged from CT.  Pneumobilia.  Suspected hepatic steatosis.   Electronically Signed   By: Julian Hy M.D.   On: 05/29/2014 10:04    Micro Results      Recent Results (from the past 240 hour(s))  CULTURE, BLOOD (ROUTINE X 2)     Status: None   Collection Time    05/28/14  2:00 PM      Result Value Ref Range Status   Specimen Description BLOOD LEFT FA   Final   Special Requests BOTTLES DRAWN AEROBIC AND ANAEROBIC [facility] EACH   Final   Culture  Setup Time     Final   Value: 05/28/2014 18:40     Performed at [facility]    Culture     Final   Value: ENTEROCOCCUS SPECIES     Note: Gram Stain Report Called to,Read Back By and Verified With:  MICHELLE MCMILLIAN @0109  ON 06/02/14 SMIAS     Performed at [facility]   Report Status 06/04/2014 FINAL   Final   Organism ID, Bacteria ENTEROCOCCUS SPECIES   Final  CULTURE, BLOOD (ROUTINE X 2)     Status: None   Collection Time    05/28/14  2:15 PM      Result Value Ref Range Status   Specimen Description BLOOD LEFT WRIST   Final   Special Requests BOTTLES DRAWN AEROBIC AND ANAEROBIC [facility] EACH   Final   Culture  Setup Time     Final   Value: 05/28/2014 18:40     Performed at [facility]   Culture     Final   Value: ESCHERICHIA COLI     Note: Gram Stain Report Called to,Read Back By and Verified With: CAROL SCHILLER 05/29/14 @ 345PM BY RUSCOE A.     Performed at [facility]   Report Status 05/31/2014 FINAL   Final   Organism ID, Bacteria ESCHERICHIA COLI   Final  URINE CULTURE     Status: None   Collection Time    05/28/14  3:46 PM      Result Value Ref Range Status   Specimen Description URINE, CATHETERIZED   Final   Special Requests NONE   Final   Culture  Setup Time     Final   Value: 05/28/2014 18:43     Performed at [place]     Final   Value: NO GROWTH     Performed at [facility]   Culture     Final   Value: NO GROWTH     Performed at [facility]   Report Status 05/29/2014 FINAL   Final  MRSA PCR SCREENING     Status: None   Collection Time    05/28/14  8:42 PM      Result Value Ref Range Status   MRSA by PCR NEGATIVE  NEGATIVE Final   Comment:            The GeneXpert MRSA Assay (FDA     approved for NASAL specimens     only), is one component of a     comprehensive MRSA colonization     surveillance program. It is not     intended to diagnose MRSA     infection nor to guide or     monitor treatment for     MRSA infections.  CULTURE, EXPECTORATED SPUTUM-ASSESSMENT      Status: None   Collection Time  05/30/14  7:33 AM      Result Value Ref Range Status   Specimen Description SPUTUM   Final   Special Requests NONE   Final   Sputum evaluation     Final   Value: MICROSCOPIC FINDINGS SUGGEST THAT THIS SPECIMEN IS NOT REPRESENTATIVE OF LOWER RESPIRATORY SECRETIONS. PLEASE RECOLLECT.     CALLED TO CAROL SCHILLER AT 0848 BY [place]OOLLENK   Report Status 05/30/2014 FINAL   Final  CULTURE, BLOOD (ROUTINE X 2)     Status: None   Collection Time    06/02/14 12:31 PM      Result Value Ref Range Status   Specimen Description BLOOD RIGHT HAND   Final   Special Requests BOTTLES DRAWN AEROBIC AND ANAEROBIC 10CC   Final   Culture  Setup Time     Final   Value: 06/02/2014 17:19     Performed at [facility]   Culture     Final   Value:        BLOOD CULTURE RECEIVED NO GROWTH TO DATE CULTURE WILL BE HELD FOR 5 DAYS BEFORE ISSUING A FINAL NEGATIVE REPORT     Performed at [facility]   Report Status PENDING   Incomplete  CULTURE, BLOOD (ROUTINE X 2)     Status: None   Collection Time    06/02/14 12:40 PM      Result Value Ref Range Status   Specimen Description BLOOD LEFT HAND   Final   Special Requests BOTTLES DRAWN AEROBIC ONLY 5CC   Final   Culture  Setup Time     Final   Value: 06/02/2014 17:17     Performed at [facility]   Culture     Final   Value:        BLOOD CULTURE RECEIVED NO GROWTH TO DATE CULTURE WILL BE HELD FOR 5 DAYS BEFORE ISSUING A FINAL NEGATIVE REPORT     Performed at [facility]   Report Status PENDING   Incomplete       Today   Subjective:   [name] today has no headache,no chest abdominal pain,no new weakness tingling or numbness, feels much better wants to go home today.    Objective:   Blood pressure 157/74, pulse 72, temperature 97.3 F (36.3 C), temperature source Oral, resp. rate 20, height 5\' 8"  (1.727 m), weight 90.1 kg (198 lb 10.2 oz), SpO2 98.00%.   Intake/Output Summary (Last  24 hours) at 06/05/14 1102 Last data filed at 06/05/14 0554  Gross per 24 hour  Intake    500 ml  Output   1000 ml  Net   -500 ml    Exam Awake Alert, Oriented x 1, No new F.N deficits, Normal affect [place].AT,PERRAL Supple Neck,No JVD, No cervical lymphadenopathy appriciated.  Symmetrical Chest wall movement, Good air movement bilaterally, CTAB RRR,No Gallops,Rubs or new Murmurs, No Parasternal Heave +ve B.Sounds, Abd Soft, Non tender, No organomegaly appriciated, No rebound -guarding or rigidity. No Cyanosis, Clubbing or edema, No new Rash or bruise  Data Review   CBC w Diff: Lab Results  Component Value Date   WBC 5.6 06/02/2014   HGB 12.1* 06/02/2014   HCT 37.4* 06/02/2014   PLT 186 06/02/2014   LYMPHOPCT 3* 05/28/2014   MONOPCT 3 05/28/2014   EOSPCT 0 05/28/2014   BASOPCT 0 05/28/2014    CMP: Lab Results  Component Value Date   NA 141 06/02/2014   K 3.9 06/02/2014   CL  106 06/02/2014   CO2 24 06/02/2014   BUN 12 06/02/2014   CREATININE 0.92 06/02/2014   PROT 5.4* 06/02/2014   ALBUMIN 2.2* 06/02/2014   BILITOT 0.4 06/02/2014   ALKPHOS 172* 06/02/2014   AST 39* 06/02/2014   ALT 53 06/02/2014  .   Total Time in preparing paper work, data evaluation and todays exam - 35 minutes  Thurnell Lose M.D on 06/05/2014 at 11:02 AM  [facility] Office  [phone]   **Disclaimer: This note may have been dictated with voice recognition software. Similar sounding words can inadvertently be transcribed and this note may contain transcription errors which may not have been corrected upon publication of note.**

## 2014-06-05 NOTE — Discharge Instructions (Signed)
Follow with Primary MD BURNETT,BRENT A, MD in 3 days   Get CBC, CMP, 2 view Chest X ray checked  by Primary MD next visit.    Activity: As tolerated with Full fall precautions use walker/cane & assistance as needed   Disposition Home     Diet: Heart Healthy  - Low Carb  For Heart failure patients - Check your Weight same time everyday, if you gain over 2 pounds, or you develop in leg swelling, experience more shortness of breath or chest pain, call your Primary MD immediately. Follow Cardiac Low Salt Diet and 1.8 lit/day fluid restriction.   On your next visit with her primary care physician please Get Medicines reviewed and adjusted.  Please request your Prim.MD to go over all Hospital Tests and Procedure/Radiological results at the follow up, please get all Hospital records sent to your Prim MD by signing hospital release before you go home.   If you experience worsening of your admission symptoms, develop shortness of breath, life threatening emergency, suicidal or homicidal thoughts you must seek medical attention immediately by calling 911 or calling your MD immediately  if symptoms less severe.  You Must read complete instructions/literature along with all the possible adverse reactions/side effects for all the Medicines you take and that have been prescribed to you. Take any new Medicines after you have completely understood and accpet all the possible adverse reactions/side effects.   Do not drive, operating heavy machinery, perform activities at heights, swimming or participation in water activities or provide baby sitting services if your were admitted for syncope or siezures until you have seen by Primary MD or a Neurologist and advised to do so again.  Do not drive when taking Pain medications.    Do not take more than prescribed Pain, Sleep and Anxiety Medications  Special Instructions: If you have smoked or chewed Tobacco  in the last 2 yrs please stop smoking, stop any  regular Alcohol  and or any Recreational drug use.  Wear Seat belts while driving.   Please note  You were cared for by a hospitalist during your hospital stay. If you have any questions about your discharge medications or the care you received while you were in the hospital after you are discharged, you can call the unit and asked to speak with the hospitalist on call if the hospitalist that took care of you is not available. Once you are discharged, your primary care physician will handle any further medical issues. Please note that NO REFILLS for any discharge medications will be authorized once you are discharged, as it is imperative that you return to your primary care physician (or establish a relationship with a primary care physician if you do not have one) for your aftercare needs so that they can reassess your need for medications and monitor your lab values.

## 2014-06-05 NOTE — Progress Notes (Signed)
Peripherally Inserted Central Catheter/Midline Placement  The IV Nurse has discussed with the patient and/or persons authorized to consent for the patient, the purpose of this procedure and the potential benefits and risks involved with this procedure.  The benefits include less needle sticks, lab draws from the catheter and patient may be discharged home with the catheter.  Risks include, but not limited to, infection, bleeding, blood clot (thrombus formation), and puncture of an artery; nerve damage and irregular heat beat.  Alternatives to this procedure were also discussed.  PICC/Midline Placement Documentation  PICC / Midline Single Lumen 06/05/14 PICC Right Brachial 43 cm 0 cm (Active)  Indication for Insertion or Continuance of Line Home intravenous therapies (PICC only) 06/05/2014  9:28 AM  Exposed Catheter (cm) 0 cm 06/05/2014  9:28 AM  Site Assessment Clean;Dry;Intact 06/05/2014  9:28 AM  Line Status Flushed;Saline locked;Blood return noted 06/05/2014  9:28 AM  Dressing Type Transparent 06/05/2014  9:28 AM  Dressing Status Clean;Dry;Intact;Antimicrobial disc in place 06/05/2014  9:28 AM  Line Care Connections checked and tightened 06/05/2014  9:28 AM  Line Adjustment (NICU/IV Team Only) No 06/05/2014  9:28 AM  Dressing Intervention New dressing 06/05/2014  9:28 AM  Dressing Change Due 06/12/14 06/05/2014  9:28 AM       Rolena Infante 06/05/2014, 9:28 AM

## 2014-06-08 LAB — CULTURE, BLOOD (ROUTINE X 2)
CULTURE: NO GROWTH
Culture: NO GROWTH

## 2014-06-14 ENCOUNTER — Other Ambulatory Visit: Payer: Self-pay | Admitting: Gastroenterology

## 2014-06-15 ENCOUNTER — Telehealth: Payer: Self-pay | Admitting: *Deleted

## 2014-06-15 NOTE — Telephone Encounter (Signed)
Elevated vancomycin trough 06/14/14.  Spoke with Jeani Hawking, [facility] pharmacist.  Dose was adjusted 06/14/14 to q48h.  Repeat trough to be obtained 06/18/14.

## 2014-06-17 ENCOUNTER — Encounter (HOSPITAL_COMMUNITY): Payer: Self-pay | Admitting: Pharmacy Technician

## 2014-06-24 ENCOUNTER — Encounter (HOSPITAL_COMMUNITY): Payer: Self-pay | Admitting: *Deleted

## 2014-07-07 ENCOUNTER — Encounter: Payer: Self-pay | Admitting: Infectious Diseases

## 2014-07-09 ENCOUNTER — Ambulatory Visit (HOSPITAL_COMMUNITY): Payer: Medicare Other | Admitting: Anesthesiology

## 2014-07-09 ENCOUNTER — Ambulatory Visit (HOSPITAL_COMMUNITY)
Admission: RE | Admit: 2014-07-09 | Discharge: 2014-07-09 | Disposition: A | Discharge status: Home / Self Care | Payer: Medicare Other | Source: Ambulatory Visit | Attending: Gastroenterology | Admitting: Gastroenterology

## 2014-07-09 ENCOUNTER — Ambulatory Visit (HOSPITAL_COMMUNITY): Payer: Medicare Other

## 2014-07-09 ENCOUNTER — Encounter (HOSPITAL_COMMUNITY): Payer: Self-pay | Admitting: *Deleted

## 2014-07-09 ENCOUNTER — Encounter (HOSPITAL_COMMUNITY)
Admission: RE | Disposition: A | Discharge status: Home / Self Care | Payer: Self-pay | Source: Ambulatory Visit | Attending: Gastroenterology

## 2014-07-09 ENCOUNTER — Encounter (HOSPITAL_COMMUNITY): Payer: Medicare Other | Admitting: Anesthesiology

## 2014-07-09 DIAGNOSIS — E785 Hyperlipidemia, unspecified: Secondary | ICD-10-CM | POA: Diagnosis not present

## 2014-07-09 DIAGNOSIS — I2510 Atherosclerotic heart disease of native coronary artery without angina pectoris: Secondary | ICD-10-CM | POA: Diagnosis not present

## 2014-07-09 DIAGNOSIS — Z8673 Personal history of transient ischemic attack (TIA), and cerebral infarction without residual deficits: Secondary | ICD-10-CM | POA: Insufficient documentation

## 2014-07-09 DIAGNOSIS — Z9689 Presence of other specified functional implants: Secondary | ICD-10-CM | POA: Diagnosis not present

## 2014-07-09 DIAGNOSIS — K219 Gastro-esophageal reflux disease without esophagitis: Secondary | ICD-10-CM | POA: Insufficient documentation

## 2014-07-09 DIAGNOSIS — F0280 Dementia in other diseases classified elsewhere without behavioral disturbance: Secondary | ICD-10-CM | POA: Insufficient documentation

## 2014-07-09 DIAGNOSIS — I441 Atrioventricular block, second degree: Secondary | ICD-10-CM | POA: Diagnosis not present

## 2014-07-09 DIAGNOSIS — Z6831 Body mass index (BMI) 31.0-31.9, adult: Secondary | ICD-10-CM | POA: Diagnosis not present

## 2014-07-09 DIAGNOSIS — I10 Essential (primary) hypertension: Secondary | ICD-10-CM | POA: Insufficient documentation

## 2014-07-09 DIAGNOSIS — Z4689 Encounter for fitting and adjustment of other specified devices: Secondary | ICD-10-CM | POA: Diagnosis not present

## 2014-07-09 DIAGNOSIS — J449 Chronic obstructive pulmonary disease, unspecified: Secondary | ICD-10-CM | POA: Diagnosis not present

## 2014-07-09 DIAGNOSIS — K8051 Calculus of bile duct without cholangitis or cholecystitis with obstruction: Secondary | ICD-10-CM | POA: Diagnosis present

## 2014-07-09 DIAGNOSIS — G309 Alzheimer's disease, unspecified: Secondary | ICD-10-CM | POA: Insufficient documentation

## 2014-07-09 DIAGNOSIS — Z880 Allergy status to penicillin: Secondary | ICD-10-CM | POA: Insufficient documentation

## 2014-07-09 DIAGNOSIS — I252 Old myocardial infarction: Secondary | ICD-10-CM | POA: Insufficient documentation

## 2014-07-09 DIAGNOSIS — H9190 Unspecified hearing loss, unspecified ear: Secondary | ICD-10-CM | POA: Diagnosis not present

## 2014-07-09 DIAGNOSIS — K831 Obstruction of bile duct: Secondary | ICD-10-CM

## 2014-07-09 DIAGNOSIS — E119 Type 2 diabetes mellitus without complications: Secondary | ICD-10-CM | POA: Insufficient documentation

## 2014-07-09 DIAGNOSIS — Z87891 Personal history of nicotine dependence: Secondary | ICD-10-CM | POA: Insufficient documentation

## 2014-07-09 DIAGNOSIS — Z881 Allergy status to other antibiotic agents status: Secondary | ICD-10-CM | POA: Insufficient documentation

## 2014-07-09 HISTORY — PX: ERCP: SHX5425

## 2014-07-09 HISTORY — DX: Malignant (primary) neoplasm, unspecified: C80.1

## 2014-07-09 HISTORY — PX: BILIARY STENT PLACEMENT: SHX5538

## 2014-07-09 LAB — GLUCOSE, CAPILLARY: Glucose-Capillary: 145 mg/dL — ABNORMAL HIGH (ref 70–99)

## 2014-07-09 SURGERY — ERCP, WITH INTERVENTION IF INDICATED
Anesthesia: General

## 2014-07-09 MED ORDER — ONDANSETRON HCL 4 MG/2ML IJ SOLN
INTRAMUSCULAR | Status: DC | PRN
  Administered 2014-07-09: 4 mg via INTRAVENOUS

## 2014-07-09 MED ORDER — GLUCAGON HCL RDNA (DIAGNOSTIC) 1 MG IJ SOLR
INTRAMUSCULAR | Status: AC
  Filled 2014-07-09: qty 1

## 2014-07-09 MED ORDER — ONDANSETRON HCL 4 MG/2ML IJ SOLN
INTRAMUSCULAR | Status: AC
  Filled 2014-07-09: qty 2

## 2014-07-09 MED ORDER — SODIUM CHLORIDE 0.9 % IV SOLN
INTRAVENOUS | Status: DC | PRN
  Administered 2014-07-09: 11:00:00

## 2014-07-09 MED ORDER — CIPROFLOXACIN IN D5W 400 MG/200ML IV SOLN
400.0000 mg | Freq: Once | INTRAVENOUS | Status: AC
  Administered 2014-07-09 (×2): 400 mg via INTRAVENOUS

## 2014-07-09 MED ORDER — LACTATED RINGERS IV SOLN
INTRAVENOUS | Status: DC
  Administered 2014-07-09: 1000 mL via INTRAVENOUS

## 2014-07-09 MED ORDER — CIPROFLOXACIN IN D5W 400 MG/200ML IV SOLN
INTRAVENOUS | Status: AC
  Filled 2014-07-09: qty 200

## 2014-07-09 MED ORDER — PROPOFOL 10 MG/ML IV BOLUS
INTRAVENOUS | Status: DC | PRN
  Administered 2014-07-09: 200 mg via INTRAVENOUS

## 2014-07-09 MED ORDER — SODIUM CHLORIDE 0.9 % IV SOLN: INTRAVENOUS | Status: DC

## 2014-07-09 MED ORDER — SUCCINYLCHOLINE CHLORIDE 20 MG/ML IJ SOLN
INTRAMUSCULAR | Status: DC | PRN
  Administered 2014-07-09: 100 mg via INTRAVENOUS

## 2014-07-09 MED ORDER — PHENYLEPHRINE HCL 10 MG/ML IJ SOLN
INTRAMUSCULAR | Status: DC | PRN
  Administered 2014-07-09: 40 ug via INTRAVENOUS
  Administered 2014-07-09 (×3): 80 ug via INTRAVENOUS

## 2014-07-09 MED ORDER — LIDOCAINE HCL (CARDIAC) 20 MG/ML IV SOLN
INTRAVENOUS | Status: DC | PRN
  Administered 2014-07-09: 100 mg via INTRAVENOUS

## 2014-07-09 MED ORDER — PROPOFOL 10 MG/ML IV BOLUS
INTRAVENOUS | Status: AC
  Filled 2014-07-09: qty 20

## 2014-07-09 MED ORDER — EPHEDRINE SULFATE 50 MG/ML IJ SOLN
INTRAMUSCULAR | Status: DC | PRN
  Administered 2014-07-09: 5 mg via INTRAVENOUS
  Administered 2014-07-09: 10 mg via INTRAVENOUS

## 2014-07-09 SURGICAL SUPPLY — 1 items: wallflex fully covered 10mm-40mm ×2 IMPLANT

## 2014-07-09 NOTE — Discharge Instructions (Addendum)

## 2014-07-09 NOTE — Anesthesia Preprocedure Evaluation (Addendum)
Anesthesia Evaluation  Patient identified by MRN, date of birth, ID band Patient awake    Reviewed: Allergy & Precautions, H&P , NPO status , Patient's Chart, lab work & pertinent test results  Airway Mallampati: II TM Distance: >3 FB Neck ROM: Full    Dental  (+) Upper Dentures, Lower Dentures   Pulmonary COPD COPD inhaler, former smoker,  breath sounds clear to auscultation  Pulmonary exam normal       Cardiovascular hypertension, Pt. on medications and Pt. on home beta blockers + CAD, + Past MI and +CHF + dysrhythmias + pacemaker Rhythm:Regular Rate:Normal     Neuro/Psych PSYCHIATRIC DISORDERS TIA   GI/Hepatic Neg liver ROS, PUD, GERD-  Medicated,  Endo/Other  diabetes, Type 2  Renal/GU Renal InsufficiencyRenal disease  negative genitourinary   Musculoskeletal negative musculoskeletal ROS (+)   Abdominal (+) + obese,   Peds negative pediatric ROS (+)  Hematology  (+) anemia ,   Anesthesia Other Findings   Reproductive/Obstetrics negative OB ROS                          Anesthesia Physical Anesthesia Plan  ASA: III  Anesthesia Plan: General   Post-op Pain Management:    Induction: Intravenous  Airway Management Planned: Oral ETT  Additional Equipment:   Intra-op Plan:   Post-operative Plan: Extubation in OR  Informed Consent: I have reviewed the patients History and Physical, chart, labs and discussed the procedure including the risks, benefits and alternatives for the proposed anesthesia with the patient or authorized representative who has indicated his/her understanding and acceptance.   Dental advisory given  Plan Discussed with: CRNA  Anesthesia Plan Comments:         Anesthesia Quick Evaluation

## 2014-07-09 NOTE — Anesthesia Postprocedure Evaluation (Signed)
  Anesthesia Post-op Note  Patient: [name]  Procedure(s) Performed: Procedure(s) (LRB): ENDOSCOPIC RETROGRADE CHOLANGIOPANCREATOGRAPHY (ERCP) (N/A) BILIARY STENT PLACEMENT (N/A)  Patient Location: PACU  Anesthesia Type: General  Level of Consciousness: awake and alert   Airway and Oxygen Therapy: Patient Spontanous Breathing  Post-op Pain: mild  Post-op Assessment: Post-op Vital signs reviewed, Patient's Cardiovascular Status Stable, Respiratory Function Stable, Patent Airway and No signs of Nausea or vomiting  Last Vitals:  Filed Vitals:   07/09/14 1150  BP:   Pulse: 62  Temp:   Resp: 12    Post-op Vital Signs: stable   Complications: No apparent anesthesia complications

## 2014-07-09 NOTE — Op Note (Signed)
[facility] [place], [address]   ERCP PROCEDURE REPORT  PATIENT: [name], [name]  MR# :[id number] BIRTHDATE: Jan 04, 1927  GENDER: male ENDOSCOPIST: Carol Ada, MD REFERRED BY: PROCEDURE DATE:  07/09/2014 PROCEDURE:   ERCP with stone extraction and stent placement ASA CLASS:    Class III INDICATIONS: Choledocholithiasis MEDICATIONS:    General Anesthesia  DESCRIPTION OF PROCEDURE:   After the risks benefits and alternatives of the procedure were thoroughly explained, informed consent was obtained.  The duodenoscope [id number]  endoscope was introduced through the mouth and advanced to the second portion of the duodenum.  The plastic biliary stent was identified and the guidewire was able to be advanced along side of the stent.  The guidewire was secured in the right intrahepatic ducts.  The biliary stent was then captured with a snare and extraction through the duodenoscope was attempted.  Unfortunately the stent lodged in the scope channel.  The entire duodenoscope was removed and the stent was then removed from the scope.  Reinsertion of the scope was performed and fortunately cannulation of the CBD was achieved with ease.  The guidewire was again secured in the right intrahepatic ducts.  Contrast injection revealed multiple and irregular filling defects.  The CBD was dilated to 15 cm.  Using the extraction balloon multiple sweeps were performed.  Cholesterol stones and sludge were removed.  A final occlusion cholangiogram was performed and there was no signficant retention of any sludge/stones in the CBD.  A 10 cm x 4 cm covered metallic stent was placed.  Excellent positioning of the stent was obtained and contrast drained easily from the CBD.  The distal portion of the stent was approximately 1.5-2 cm beyond the papilla, but it was on par with the mouth of the diverticulum.  A metallic stent was placed as he has recurrent episodes of  cholangitis from distal CBD obstruction.  I believe this is secondary to the large diverticulum.  At this point the procedure was terminated.       COMPLICATIONS:   None  ENDOSCOPIC IMPRESSION: 1) Choledocholithiasis s/p stone extraction and stent placement.  RECOMMENDATIONS: 1) Follow up PRN.    _______________________________ eSignedCarol Ada, MD 07/09/2014 10:55 AM   CC:

## 2014-07-09 NOTE — Transfer of Care (Signed)
Immediate Anesthesia Transfer of Care Note  Patient: [name]  Procedure(s) Performed: Procedure(s): ENDOSCOPIC RETROGRADE CHOLANGIOPANCREATOGRAPHY (ERCP) (N/A) BILIARY STENT PLACEMENT (N/A)  Patient Location: PACU  Anesthesia Type:General  Level of Consciousness: sedated  Airway & Oxygen Therapy: Patient Spontanous Breathing and Patient connected to face mask oxygen  Post-op Assessment: Report given to PACU RN and Post -op Vital signs reviewed and stable  Post vital signs: Reviewed and stable  Complications: No apparent anesthesia complications

## 2014-07-09 NOTE — H&P (Signed)
  [name] HPI: [name] is an 80 year old male who is well-known to me.  He recent represented with cholangitis.  In the past he underwent placement of a metallic stent with extraction of a CBD stone by IR.  During that procedure there was no evidence of any CBD stones, but a stent was placed as there was a persistent stenosis in the distal CBD.  For reasons that are unclear, his metallic stent migrated out of the CBD.  This precipitated the recurrent cholangitis.  Dr. Fuller Plan was able to place a 10 Fr x 7 cm stent and there were findings of CBD stones.  As a result of the stenting his symptoms of cholangitis resolved.  He is here today for a repeat placement of a metallic CBD stent and extraction of the CBD stones.  Past Medical History  Diagnosis Date  . Chest discomfort   . Coronary artery disease   . Diabetes mellitus   . Hypertension   . Hyperlipidemia   . Obesities, morbid   . Gallstone pancreatitis     recurrent  . TIA (transient ischemic attack)   . Bacteremia due to vancomycin resistant Enterococcus   . GERD (gastroesophageal reflux disease)   . Hearing impairment   . Myocardial infarction, anterior wall, subsequent care   . Second degree AV block, Mobitz type II     s/p PPM by JA 4/12  . ARF (acute renal failure)   . Cholangitis   . Duodenal ulcer   . GI bleed   . Anemia associated with acute blood loss   . 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 Dementia in Alzheimer's disease with delirium   . Cancer     precancer skin lesions    Past Surgical History  Procedure Laterality Date  . Cholecystectomy    . Pacemaker placement  01/01/11    implanted by JA for mobitz II AV block  . Pancreatic stent    . Ercp N/A 05/30/2014    Procedure: ENDOSCOPIC RETROGRADE CHOLANGIOPANCREATOGRAPHY (ERCP);  Surgeon: Ladene Artist, MD;  Location: [facility] ENDOSCOPY;  Service: Endoscopy;  Laterality: N/A;    Family History  Problem Relation Age of Onset   . Hypertension Other     Social History:  reports that he quit smoking about 18 years ago. His smoking use included Cigarettes. He has a 57 pack-year smoking history. He has never used smokeless tobacco. He reports that he does not drink alcohol or use illicit drugs.  Allergies:  Allergies  Allergen Reactions  . Doxycycline Other (See Comments)    Unknown- patient doesn't remember  . Penicillins Rash    Medications:  Scheduled:  Continuous: . sodium chloride    . lactated ringers      No results found for this or any previous visit (from the past 24 hour(s)).   No results found.  ROS:  As stated above in the HPI otherwise negative.  There were no vitals taken for this visit.    PE: Gen: NAD, Alert and Oriented HEENT:  [place]/AT, EOMI Neck: Supple, no LAD Lungs: CTA Bilaterally CV: RRR without M/G/R ABM: Soft, NTND, +BS Ext: No C/C/E  Assessment/Plan: 1) CBD stones and distal CBD stricture.  Plan: 1) ERCP with metallic stent placement and hopefully extraction of the CBD stones.  [name] D 07/09/2014, 8:27 AM

## 2014-07-12 ENCOUNTER — Encounter (HOSPITAL_COMMUNITY): Payer: Self-pay | Admitting: Gastroenterology

## 2014-07-19 ENCOUNTER — Encounter: Payer: Self-pay | Admitting: Internal Medicine

## 2014-07-19 ENCOUNTER — Ambulatory Visit (INDEPENDENT_AMBULATORY_CARE_PROVIDER_SITE_OTHER): Payer: Medicare Other | Admitting: *Deleted

## 2014-07-19 DIAGNOSIS — I441 Atrioventricular block, second degree: Secondary | ICD-10-CM

## 2014-07-19 NOTE — Progress Notes (Signed)
Remote pacemaker transmission.   

## 2014-07-21 ENCOUNTER — Telehealth: Payer: Self-pay | Admitting: *Deleted

## 2014-07-21 LAB — MDC_IDC_ENUM_SESS_TYPE_REMOTE
Battery Remaining Longevity: 95 mo
Battery Remaining Percentage: 72 %
Battery Voltage: 2.95 V
Brady Statistic AP VP Percent: 1 %
Brady Statistic AS VP Percent: 1 %
Brady Statistic RV Percent Paced: 1 %
Implantable Pulse Generator Model: 2210
Implantable Pulse Generator Serial Number: 7228914
Lead Channel Impedance Value: 380 Ohm
Lead Channel Impedance Value: 530 Ohm
Lead Channel Pacing Threshold Amplitude: 0.5 V
Lead Channel Pacing Threshold Amplitude: 2.125 V
Lead Channel Pacing Threshold Pulse Width: 0.4 ms
Lead Channel Pacing Threshold Pulse Width: 0.4 ms
Lead Channel Sensing Intrinsic Amplitude: 4.4 mV
Lead Channel Setting Pacing Amplitude: 1.5 V
Lead Channel Setting Pacing Amplitude: 2.375
Lead Channel Setting Pacing Pulse Width: 0.4 ms
Lead Channel Setting Sensing Sensitivity: 2 mV
MDC IDC MSMT LEADCHNL RV SENSING INTR AMPL: 12 mV
MDC IDC SESS DTM: 20151026061108
MDC IDC STAT BRADY AP VS PERCENT: 2.7 %
MDC IDC STAT BRADY AS VS PERCENT: 97 %
MDC IDC STAT BRADY RA PERCENT PACED: 2.7 %

## 2014-07-21 NOTE — Telephone Encounter (Signed)
Spoke to wife regarding recent v. Noise reversions. Wife states that the patient wasn't doing anything on Sunday to cause the episodes during that time. I assured her that we will call her back if JA has any recommendations.

## 2014-07-23 ENCOUNTER — Other Ambulatory Visit: Payer: Self-pay | Admitting: Pulmonary Disease

## 2014-07-28 ENCOUNTER — Encounter: Payer: Self-pay | Admitting: Cardiology

## 2014-09-15 ENCOUNTER — Encounter: Payer: Self-pay | Admitting: Infectious Diseases

## 2014-09-22 ENCOUNTER — Ambulatory Visit (INDEPENDENT_AMBULATORY_CARE_PROVIDER_SITE_OTHER): Payer: Medicare Other | Admitting: Cardiology

## 2014-09-22 ENCOUNTER — Encounter: Payer: Self-pay | Admitting: Cardiology

## 2014-09-22 ENCOUNTER — Other Ambulatory Visit: Payer: Self-pay | Admitting: Cardiology

## 2014-09-22 VITALS — BP 147/84 | HR 76 | Ht 66.0 in | Wt 206.5 lb

## 2014-09-22 DIAGNOSIS — J439 Emphysema, unspecified: Secondary | ICD-10-CM

## 2014-09-22 DIAGNOSIS — N182 Chronic kidney disease, stage 2 (mild): Secondary | ICD-10-CM

## 2014-09-22 DIAGNOSIS — I5022 Chronic systolic (congestive) heart failure: Secondary | ICD-10-CM

## 2014-09-22 DIAGNOSIS — N189 Chronic kidney disease, unspecified: Secondary | ICD-10-CM

## 2014-09-22 DIAGNOSIS — I441 Atrioventricular block, second degree: Secondary | ICD-10-CM

## 2014-09-22 DIAGNOSIS — I252 Old myocardial infarction: Secondary | ICD-10-CM

## 2014-09-22 MED ORDER — METOPROLOL TARTRATE 50 MG PO TABS: 50.0000 mg | ORAL_TABLET | Freq: Two times a day (BID) | ORAL | Status: DC

## 2014-09-22 NOTE — Progress Notes (Signed)
[name] Date of Birth: Jan 10, 1927   History of Present Illness: [name] is seen for followup of CAD and pacemaker. He is status post acute anterior myocardial infarction in April of 2012 treated with direct stenting of the mid LAD. He also had second-degree heart block with significant bradycardia that was symptomatic and he underwent a permanent pacemaker. He is seen today in follow up with his wife. He is extremely hard of hearing. He has lost 8 lbs. His wife states he  is very sedentary. He was admitted in September with bacteremia and cholangitis. He was treated with IV antibiotics and had a biliary stent placed. He has recovered well from that. He does have SOB and wheezing but will not use his albuterol inhaler. No chest pain.  Current Outpatient Prescriptions on File Prior to Visit  Medication Sig Dispense Refill  . albuterol (PROVENTIL HFA;VENTOLIN HFA) 108 (90 BASE) MCG/ACT inhaler Inhale 2 puffs into the lungs every 4 (four) hours as needed. For shortness of breath    . aspirin 325 MG tablet Take 162.5 mg by mouth daily.     Marland Kitchen donepezil (ARICEPT) 10 MG tablet Take 10 mg by mouth at bedtime.    . folic acid (FOLVITE) 1 MG tablet Take 1 mg by mouth daily.    . hydrochlorothiazide (HYDRODIURIL) 25 MG tablet Take 12.5 mg by mouth every morning.     Marland Kitchen losartan (COZAAR) 50 MG tablet Take 50 mg by mouth every morning.    Marland Kitchen MAGNESIUM SULFATE PO Take 1 tablet by mouth daily.    . Memantine HCl ER (NAMENDA XR) 28 MG CP24 Take 28 mg by mouth every morning.     . Multiple Vitamins-Minerals (CENTRUM SILVER PO) Take 1 tablet by mouth every morning.     . Omega-3 Fatty Acids (FISH OIL PO) Take 2 capsules by mouth daily.    Marland Kitchen omeprazole (PRILOSEC OTC) 20 MG tablet Take 20 mg by mouth 2 (two) times daily.    . polyethylene glycol (MIRALAX / GLYCOLAX) packet Take 17 g by mouth daily.    . simvastatin (ZOCOR) 20 MG tablet Take 20 mg by mouth every evening.     . SYMBICORT 160-4.5 MCG/ACT  inhaler INHALE 1 PUFF INTO LUNGS TWICE A DAY 1 Inhaler 2  . tiotropium (SPIRIVA) 18 MCG inhalation capsule Place 18 mcg into inhaler and inhale daily.     No current facility-administered medications on file prior to visit.    Allergies  Allergen Reactions  . Doxycycline Other (See Comments)    Unknown- patient doesn't remember  . Penicillins Rash    Past Medical History  Diagnosis Date  . Chest discomfort   . Coronary artery disease   . Diabetes mellitus   . Hypertension   . Hyperlipidemia   . Obesities, morbid   . Gallstone pancreatitis     recurrent  . TIA (transient ischemic attack)   . Bacteremia due to vancomycin resistant Enterococcus   . GERD (gastroesophageal reflux disease)   . Hearing impairment   . Myocardial infarction, anterior wall, subsequent care   . Second degree AV block, Mobitz type II     s/p PPM by JA 4/12  . ARF (acute renal failure)   . Cholangitis   . Duodenal ulcer   . GI bleed   . Anemia associated with acute blood loss   . 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 Dementia in Alzheimer's disease with delirium   .  Cancer     precancer skin lesions    Past Surgical History  Procedure Laterality Date  . Cholecystectomy    . Pacemaker placement  01/01/11    implanted by JA for mobitz II AV block  . Pancreatic stent    . Ercp N/A 05/30/2014    Procedure: ENDOSCOPIC RETROGRADE CHOLANGIOPANCREATOGRAPHY (ERCP);  Surgeon: Ladene Artist, MD;  Location: [facility] ENDOSCOPY;  Service: Endoscopy;  Laterality: N/A;  . Ercp N/A 07/09/2014    Procedure: ENDOSCOPIC RETROGRADE CHOLANGIOPANCREATOGRAPHY (ERCP);  Surgeon: Beryle Beams, MD;  Location: [facility] ENDOSCOPY;  Service: Endoscopy;  Laterality: N/A;  . Biliary stent placement N/A 07/09/2014    Procedure: BILIARY STENT PLACEMENT;  Surgeon: Beryle Beams, MD;  Location: [facility] ENDOSCOPY;  Service: Endoscopy;  Laterality: N/A;    History  Smoking status  . Former Smoker -- 1.00  packs/day for 57 years  . Types: Cigarettes  . Quit date: 01/12/1996  Smokeless tobacco  . Never Used    History  Alcohol Use No    Family History  Problem Relation Age of Onset  . Hypertension Other     Review of Systems: The review of systems is as noted in HPI. He is very hard of hearing.  All other systems were reviewed and are negative.  Physical Exam: BP 147/84 mmHg  Pulse 76  Ht 5\' 6"  (1.676 m)  Wt 206 lb 8 oz (93.668 kg)  BMI 33.35 kg/m2 He is an elderly overweight white male who is very hard of hearing. He is in no distress. HEENT exam reveals he is wearing hearing aid. His pupils are equal round and reactive. Sclera are clear. Oropharynx is clear. Neck is without JVD, adenopathy, or bruits. Lungs are clear with diminished breath sounds throughout, few wheezes. Cardiac exam reveals a regular rate and rhythm without gallop, murmur, or click. His pacemaker site is normal. Abdomen is soft and nontender without masses or hepatosplenomegaly. Femoral and pedal pulses are 2+ and symmetric. He has no edema. Neurologic exam is nonfocal.  LABORATORY DATA:  Pacemaker check in October was satisfactory.  Assessment / Plan: 1. Coronary disease status post anterior myocardial infarction treated with stenting of the LAD. He is asymptomatic. We will continue with aspirin, metoprolol, and statin therapy.   2. Severe COPD.  3. Morbid obesity. He has gained 10 pounds since his last visit. Encouraged him to eat less and increase his activity.  4. AV block s/p pacemaker implant. Continue followup in pacemaker clinic.   5. CBD stones with bacteremia. S/p biliary stent. Resolved.  4. Hyperlipidemia. Continue statin therapy.  5. Chronic diastolic congestive heart failure. He appears to be well compensated on his current medical therapy.  I have requested his most recent lab work from his primary care. I will see in 6 months.

## 2014-09-22 NOTE — Patient Instructions (Signed)
Continue your current therapy  I will see you in 6 months.   

## 2014-10-06 ENCOUNTER — Ambulatory Visit (INDEPENDENT_AMBULATORY_CARE_PROVIDER_SITE_OTHER): Payer: Medicare Other | Admitting: Pulmonary Disease

## 2014-10-06 ENCOUNTER — Encounter: Payer: Self-pay | Admitting: Pulmonary Disease

## 2014-10-06 VITALS — BP 120/60 | HR 76 | Ht 67.0 in | Wt 204.0 lb

## 2014-10-06 DIAGNOSIS — J432 Centrilobular emphysema: Secondary | ICD-10-CM

## 2014-10-06 NOTE — Patient Instructions (Signed)
Follow up in 1 year.

## 2014-10-06 NOTE — Progress Notes (Signed)
Chief Complaint  Patient presents with  . Follow-up    Denies any breathing issues since last OV. No complaints.     History of Present Illness: [name] is a 81 y.o. male former smoker with severe COPD/emphysema.  His breathing has been okay.  He does not have wheeze.  He gets occasional cough with clear sputum.  He is not very active >> he gets unsteady when he walks.  His dementia and hearing are getting worse.  He also has more trouble with is vision.  He is only using albuterol occasionally.  TESTS: CT chest 08/26/09 >> mild centrilobular emphysema, 3 mm RUL and 4 mm RLL nodules CT chest 12/17/09 >> no change CT chest 05/03/10 >> RLL nodule 5.4 mm Echo 12/20/11 >> EF 55 to 60%, mild AS, grade 1 diastolic dysfx Spirometry 11/24/12 >> FEV1 0.89, FEV1% 57  PMHx >> CAD, HTN, Mobitz II s/p PPM, HLD, TIA, GERD, PUD, DM, Alzheimer's dementia  PSHx, Medications, Allergies, Fhx, Shx reviewed.   Physical Exam: Blood pressure 120/60, pulse 76, height 5\' 7"  (1.702 m), weight 204 lb (92.534 kg), SpO2 94 %. Body mass index is 31.94 kg/(m^2).  General - No distress ENT - No sinus tenderness, no oral exudate, no LAN Cardiac - s1s2 regular, no murmur Chest - Decreased breath sounds, no wheeze Back - No focal tenderness Abd - Soft, non-tender Ext - No edema Neuro - Normal strength, poor hearing Skin - No rashes Psych - normal mood, and behavior   Assessment/Plan:  COPD with emphysema. Plan: - he is to continue spiriva, and symbicort - he can continue albuterol prn   Chesley Mires, MD [place] [facility] Pager:  [phone] 10/06/2014, 3:59 PM

## 2014-10-10 ENCOUNTER — Other Ambulatory Visit: Payer: Self-pay | Admitting: Pulmonary Disease

## 2014-10-20 ENCOUNTER — Encounter: Payer: Self-pay | Admitting: Internal Medicine

## 2014-10-20 ENCOUNTER — Telehealth: Payer: Self-pay | Admitting: Cardiology

## 2014-10-20 ENCOUNTER — Ambulatory Visit (INDEPENDENT_AMBULATORY_CARE_PROVIDER_SITE_OTHER): Payer: Medicare Other | Admitting: *Deleted

## 2014-10-20 DIAGNOSIS — I441 Atrioventricular block, second degree: Secondary | ICD-10-CM

## 2014-10-20 LAB — MDC_IDC_ENUM_SESS_TYPE_REMOTE
Battery Remaining Longevity: 94 mo
Battery Remaining Percentage: 70 %
Battery Voltage: 2.95 V
Brady Statistic AP VS Percent: 4.2 %
Brady Statistic AS VP Percent: 1 %
Brady Statistic RA Percent Paced: 3.9 %
Brady Statistic RV Percent Paced: 1 %
Implantable Pulse Generator Model: 2210
Implantable Pulse Generator Serial Number: 7228914
Lead Channel Impedance Value: 360 Ohm
Lead Channel Impedance Value: 510 Ohm
Lead Channel Pacing Threshold Pulse Width: 0.4 ms
Lead Channel Sensing Intrinsic Amplitude: 12 mV
Lead Channel Sensing Intrinsic Amplitude: 3.5 mV
Lead Channel Setting Pacing Amplitude: 1.5 V
Lead Channel Setting Pacing Pulse Width: 0.4 ms
Lead Channel Setting Sensing Sensitivity: 2 mV
MDC IDC MSMT LEADCHNL RA PACING THRESHOLD AMPLITUDE: 0.5 V
MDC IDC MSMT LEADCHNL RA PACING THRESHOLD PULSEWIDTH: 0.4 ms
MDC IDC MSMT LEADCHNL RV PACING THRESHOLD AMPLITUDE: 2 V
MDC IDC SESS DTM: 20160127204411
MDC IDC SET LEADCHNL RV PACING AMPLITUDE: 2.25 V
MDC IDC STAT BRADY AP VP PERCENT: 1 %
MDC IDC STAT BRADY AS VS PERCENT: 95 %

## 2014-10-20 NOTE — Telephone Encounter (Signed)
Confirmed remote transmission w/ pt wife.   

## 2014-10-20 NOTE — Progress Notes (Signed)
Remote pacemaker transmission.   

## 2014-11-10 ENCOUNTER — Encounter: Payer: Self-pay | Admitting: Cardiology

## 2014-12-16 ENCOUNTER — Telehealth: Payer: Self-pay | Admitting: Cardiology

## 2014-12-17 NOTE — Telephone Encounter (Signed)
Close ecounter

## 2015-01-10 ENCOUNTER — Other Ambulatory Visit: Payer: Self-pay

## 2015-01-12 ENCOUNTER — Encounter: Payer: Self-pay | Admitting: Internal Medicine

## 2015-01-12 ENCOUNTER — Other Ambulatory Visit: Payer: Self-pay

## 2015-01-12 ENCOUNTER — Ambulatory Visit (INDEPENDENT_AMBULATORY_CARE_PROVIDER_SITE_OTHER): Payer: Medicare Other | Admitting: Internal Medicine

## 2015-01-12 VITALS — BP 134/70 | HR 75 | Ht 67.0 in | Wt 203.4 lb

## 2015-01-12 DIAGNOSIS — I441 Atrioventricular block, second degree: Secondary | ICD-10-CM

## 2015-01-12 DIAGNOSIS — I10 Essential (primary) hypertension: Secondary | ICD-10-CM | POA: Diagnosis not present

## 2015-01-12 LAB — MDC_IDC_ENUM_SESS_TYPE_INCLINIC
Battery Remaining Longevity: 132 mo
Brady Statistic RA Percent Paced: 4.4 %
Brady Statistic RV Percent Paced: 0.19 %
Date Time Interrogation Session: 20160420125148
Implantable Pulse Generator Model: 2210
Implantable Pulse Generator Serial Number: 7228914
Lead Channel Pacing Threshold Amplitude: 0.5 V
Lead Channel Pacing Threshold Amplitude: 1.5 V
Lead Channel Pacing Threshold Pulse Width: 0.4 ms
Lead Channel Pacing Threshold Pulse Width: 0.4 ms
Lead Channel Sensing Intrinsic Amplitude: 12 mV
Lead Channel Sensing Intrinsic Amplitude: 3.9 mV
Lead Channel Setting Pacing Amplitude: 1.5 V
Lead Channel Setting Pacing Pulse Width: 0.4 ms
Lead Channel Setting Sensing Sensitivity: 2 mV
MDC IDC MSMT BATTERY VOLTAGE: 2.95 V
MDC IDC MSMT LEADCHNL RA IMPEDANCE VALUE: 350 Ohm
MDC IDC MSMT LEADCHNL RA PACING THRESHOLD AMPLITUDE: 0.5 V
MDC IDC MSMT LEADCHNL RV IMPEDANCE VALUE: 475 Ohm
MDC IDC MSMT LEADCHNL RV PACING THRESHOLD PULSEWIDTH: 0.8 ms
MDC IDC SET LEADCHNL RV PACING AMPLITUDE: 2.25 V

## 2015-01-12 NOTE — Patient Instructions (Signed)
Medication Instructions:  Your physician recommends that you continue on your current medications as directed. Please refer to the Current Medication list given to you today.   Labwork: None ordered  Testing/Procedures: None ordered  Follow-Up: Remote monitoring is used to monitor your Pacemaker or ICD from home. This monitoring reduces the number of office visits required to check your device to one time per year. It allows [place] to keep an eye on the functioning of your device to ensure it is working properly. You are scheduled for a device check from home on 04/13/15. You may send your transmission at any time that day. If you have a wireless device, the transmission will be sent automatically. After your physician reviews your transmission, you will receive a postcard with your next transmission date.  Your physician wants you to follow-up in: 12 months with Chanetta Marshall, NP You will receive a reminder letter in the mail two months in advance. If you don't receive a letter, please call our office to schedule the follow-up appointment.     Any Other Special Instructions Will Be Listed Below (If Applicable).

## 2015-01-12 NOTE — Progress Notes (Signed)
Electrophysiology Office Note   Date:  01/12/2015   ID:  [name], DOB 03/27/1927, MRN [id number]  PCP:  Stephens Shire, MD   Primary Electrophysiologist: Thompson Grayer, MD    Chief Complaint  Patient presents with  . Appointment    Mobitz II 2nd degree AV block     History of Present Illness: [name] is a 81 y.o. male who presents today for electrophysiology evaluation.   He is doing well.  Chronic SOB (COPD).  Does not smoke.   Today, he denies symptoms of palpitations, chest pain,  orthopnea, PND, lower extremity edema, claudication, dizziness, presyncope, syncope, bleeding, or neurologic sequela. The patient is tolerating medications without difficulties and is otherwise without complaint today.    Past Medical History  Diagnosis Date  . Chest discomfort   . Coronary artery disease   . Diabetes mellitus   . Hypertension   . Hyperlipidemia   . Obesities, morbid   . Gallstone pancreatitis     recurrent  . TIA (transient ischemic attack)   . Bacteremia due to vancomycin resistant Enterococcus   . GERD (gastroesophageal reflux disease)   . Hearing impairment   . Myocardial infarction, anterior wall, subsequent care   . Second degree AV block, Mobitz type II     s/p PPM by JA 4/12  . ARF (acute renal failure)   . Cholangitis   . Duodenal ulcer   . GI bleed   . Anemia associated with acute blood loss   . 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 Dementia in Alzheimer's disease with delirium   . Cancer     precancer skin lesions   Past Surgical History  Procedure Laterality Date  . Cholecystectomy    . Pacemaker placement  01/01/11    implanted by JA for mobitz II AV block  . Pancreatic stent    . Ercp N/A 05/30/2014    Procedure: ENDOSCOPIC RETROGRADE CHOLANGIOPANCREATOGRAPHY (ERCP);  Surgeon: Ladene Artist, MD;  Location: [facility] ENDOSCOPY;  Service: Endoscopy;  Laterality: N/A;  . Ercp N/A 07/09/2014    Procedure:  ENDOSCOPIC RETROGRADE CHOLANGIOPANCREATOGRAPHY (ERCP);  Surgeon: Beryle Beams, MD;  Location: [facility] ENDOSCOPY;  Service: Endoscopy;  Laterality: N/A;  . Biliary stent placement N/A 07/09/2014    Procedure: BILIARY STENT PLACEMENT;  Surgeon: Beryle Beams, MD;  Location: [facility] ENDOSCOPY;  Service: Endoscopy;  Laterality: N/A;     Current Outpatient Prescriptions  Medication Sig Dispense Refill  . albuterol (PROVENTIL HFA;VENTOLIN HFA) 108 (90 BASE) MCG/ACT inhaler Inhale 2 puffs into the lungs every 4 (four) hours as needed. For shortness of breath    . aspirin 325 MG tablet Take 162.5 mg by mouth daily.     Marland Kitchen donepezil (ARICEPT) 10 MG tablet Take 10 mg by mouth at bedtime.    . folic acid (FOLVITE) 1 MG tablet Take 1 mg by mouth daily.    . hydrochlorothiazide (HYDRODIURIL) 25 MG tablet Take 12.5 mg by mouth every morning.     Marland Kitchen losartan (COZAAR) 50 MG tablet Take 50 mg by mouth every morning.    Marland Kitchen MAGNESIUM SULFATE PO Take 1 tablet by mouth daily.    . Memantine HCl ER (NAMENDA XR) 28 MG CP24 Take 28 mg by mouth every morning.     . metoprolol (LOPRESSOR) 50 MG tablet Take 1 tablet (50 mg total) by mouth 2 (two) times daily. 180 tablet 3  . Multiple Vitamins-Minerals (CENTRUM SILVER PO) Take 1  tablet by mouth every morning.     . Omega-3 Fatty Acids (FISH OIL PO) Take 2 capsules by mouth daily.    Marland Kitchen omeprazole (PRILOSEC OTC) 20 MG tablet Take 20 mg by mouth 2 (two) times daily.    . polyethylene glycol (MIRALAX / GLYCOLAX) packet Take 17 g by mouth daily.    . simvastatin (ZOCOR) 20 MG tablet Take 20 mg by mouth every evening.     . SYMBICORT 160-4.5 MCG/ACT inhaler INHALE 1 PUFF INTO THE LUNGS TWICE A DAY 1 Inhaler 0  . tiotropium (SPIRIVA) 18 MCG inhalation capsule Place 18 mcg into inhaler and inhale daily.    Marland Kitchen triamcinolone cream (KENALOG) 0.1 % Apply 1 application topically daily as needed (itching).   5   No current facility-administered medications for this visit.    Allergies:    Doxycycline and Penicillins   Social History:  The patient  reports that he quit smoking about 19 years ago. His smoking use included Cigarettes. He has a 57 pack-year smoking history. He has never used smokeless tobacco. He reports that he does not drink alcohol or use illicit drugs.   Family History:  [name] family history includes Hypertension in his other.    ROS:  Please see the history of present illness.   All other systems are reviewed and negative.    PHYSICAL EXAM: VS:  BP 134/70 mmHg  Pulse 75  Ht 5\' 7"  (1.702 m)  Wt 203 lb 6.4 oz (92.262 kg)  BMI 31.85 kg/m2 , BMI Body mass index is 31.85 kg/(m^2). GEN: Well nourished, well developed, in no acute distress HEENT: normal Neck: no JVD, carotid bruits, or masses Cardiac: RRR; no murmurs, rubs, or gallops,no edema  Respiratory:  clear to auscultation bilaterally with prolonged expiratory phase, normal work of breathing GI: soft, nontender, nondistended, + BS MS: no deformity or atrophy Skin: warm and dry, device pocket is well healed Neuro:  Strength and sensation are intact Psych: euthymic mood, full affect  Device interrogation is reviewed today in detail.  See PaceArt for details.   Recent Labs: 05/28/2014: Pro B Natriuretic peptide (BNP) 434.4 06/02/2014: ALT 53; BUN 12; Creatinine 0.92; Hemoglobin 12.1*; Platelets 186; Potassium 3.9; Sodium 141    Lipid Panel     Component Value Date/Time   CHOL 212* 03/12/2011 0935   TRIG 115.0 03/12/2011 0935   HDL 65.40 03/12/2011 0935   CHOLHDL 3 03/12/2011 0935   VLDL 23.0 03/12/2011 0935   LDLCALC  01/19/2011 0340    91        Total Cholesterol/HDL:CHD Risk Coronary Heart Disease Risk Table                     Men   Women  1/2 Average Risk   3.4   3.3  Average Risk       5.0   4.4  2 X Average Risk   9.6   7.1  3 X Average Risk  23.4   11.0        Use the calculated Patient Ratio above and the CHD Risk Table to determine the patient's CHD Risk.        ATP  III CLASSIFICATION (LDL):  <100     mg/dL   Optimal  100-129  mg/dL   Near or Above                    Optimal  130-159  mg/dL   Borderline  160-189  mg/dL   High  >190     mg/dL   Very High   LDLDIRECT 126.1 03/12/2011 0935     Wt Readings from Last 3 Encounters:  01/12/15 203 lb 6.4 oz (92.262 kg)  10/06/14 204 lb (92.534 kg)  09/22/14 206 lb 8 oz (93.668 kg)      ASSESSMENT AND PLAN:  1. Mobitz II second degree AV block Normal pacemaker function See Pace Art report No changes today RV lead threshold is chronically elevated but stable. He does not V pace presently.  RV noise is noted today but not clinically causing issues.  Could be reproduced with isometric exercise today suggesting likely an abrasion.  Will turn autosensitivity off and fix sensitivity.  Given advanced age would avoid revision if possible. Follow closely with Merlin  2. Htn Stable No change required today  3. Cad Stable No change required today   Current medicines are reviewed at length with the patient today.   The patient does not have concerns regarding his medicines.  The following changes were made today:  none  Follow-up: merlin,  Return to see EP NP in 1 year  Signed, Thompson Grayer, MD  01/12/2015 11:41 AM     [facility] [address] [place] [address] [phone] (office) [phone] (fax)

## 2015-03-08 ENCOUNTER — Encounter (HOSPITAL_BASED_OUTPATIENT_CLINIC_OR_DEPARTMENT_OTHER): Payer: Self-pay

## 2015-03-08 ENCOUNTER — Emergency Department (HOSPITAL_BASED_OUTPATIENT_CLINIC_OR_DEPARTMENT_OTHER): Payer: Medicare Other

## 2015-03-08 ENCOUNTER — Emergency Department (HOSPITAL_BASED_OUTPATIENT_CLINIC_OR_DEPARTMENT_OTHER)
Admission: EM | Admit: 2015-03-08 | Discharge: 2015-03-08 | Disposition: A | Discharge status: Home / Self Care | Payer: Medicare Other | Attending: Emergency Medicine | Admitting: Emergency Medicine

## 2015-03-08 DIAGNOSIS — Z87448 Personal history of other diseases of urinary system: Secondary | ICD-10-CM | POA: Diagnosis not present

## 2015-03-08 DIAGNOSIS — Z7982 Long term (current) use of aspirin: Secondary | ICD-10-CM | POA: Diagnosis not present

## 2015-03-08 DIAGNOSIS — G309 Alzheimer's disease, unspecified: Secondary | ICD-10-CM | POA: Diagnosis not present

## 2015-03-08 DIAGNOSIS — I252 Old myocardial infarction: Secondary | ICD-10-CM | POA: Diagnosis not present

## 2015-03-08 DIAGNOSIS — W01198A Fall on same level from slipping, tripping and stumbling with subsequent striking against other object, initial encounter: Secondary | ICD-10-CM | POA: Diagnosis not present

## 2015-03-08 DIAGNOSIS — D62 Acute posthemorrhagic anemia: Secondary | ICD-10-CM | POA: Diagnosis not present

## 2015-03-08 DIAGNOSIS — Z8673 Personal history of transient ischemic attack (TIA), and cerebral infarction without residual deficits: Secondary | ICD-10-CM | POA: Diagnosis not present

## 2015-03-08 DIAGNOSIS — F0280 Dementia in other diseases classified elsewhere without behavioral disturbance: Secondary | ICD-10-CM | POA: Insufficient documentation

## 2015-03-08 DIAGNOSIS — K219 Gastro-esophageal reflux disease without esophagitis: Secondary | ICD-10-CM | POA: Diagnosis not present

## 2015-03-08 DIAGNOSIS — Y998 Other external cause status: Secondary | ICD-10-CM | POA: Diagnosis not present

## 2015-03-08 DIAGNOSIS — Z8619 Personal history of other infectious and parasitic diseases: Secondary | ICD-10-CM | POA: Diagnosis not present

## 2015-03-08 DIAGNOSIS — Z87891 Personal history of nicotine dependence: Secondary | ICD-10-CM | POA: Insufficient documentation

## 2015-03-08 DIAGNOSIS — I2510 Atherosclerotic heart disease of native coronary artery without angina pectoris: Secondary | ICD-10-CM | POA: Insufficient documentation

## 2015-03-08 DIAGNOSIS — E785 Hyperlipidemia, unspecified: Secondary | ICD-10-CM | POA: Insufficient documentation

## 2015-03-08 DIAGNOSIS — Z85828 Personal history of other malignant neoplasm of skin: Secondary | ICD-10-CM | POA: Insufficient documentation

## 2015-03-08 DIAGNOSIS — I10 Essential (primary) hypertension: Secondary | ICD-10-CM | POA: Insufficient documentation

## 2015-03-08 DIAGNOSIS — Z79899 Other long term (current) drug therapy: Secondary | ICD-10-CM | POA: Diagnosis not present

## 2015-03-08 DIAGNOSIS — H9190 Unspecified hearing loss, unspecified ear: Secondary | ICD-10-CM | POA: Diagnosis not present

## 2015-03-08 DIAGNOSIS — Y9289 Other specified places as the place of occurrence of the external cause: Secondary | ICD-10-CM | POA: Diagnosis not present

## 2015-03-08 DIAGNOSIS — Z950 Presence of cardiac pacemaker: Secondary | ICD-10-CM | POA: Diagnosis not present

## 2015-03-08 DIAGNOSIS — J449 Chronic obstructive pulmonary disease, unspecified: Secondary | ICD-10-CM | POA: Insufficient documentation

## 2015-03-08 DIAGNOSIS — E119 Type 2 diabetes mellitus without complications: Secondary | ICD-10-CM | POA: Insufficient documentation

## 2015-03-08 DIAGNOSIS — S2232XA Fracture of one rib, left side, initial encounter for closed fracture: Secondary | ICD-10-CM | POA: Insufficient documentation

## 2015-03-08 DIAGNOSIS — Z880 Allergy status to penicillin: Secondary | ICD-10-CM | POA: Insufficient documentation

## 2015-03-08 DIAGNOSIS — Y9389 Activity, other specified: Secondary | ICD-10-CM | POA: Diagnosis not present

## 2015-03-08 DIAGNOSIS — M549 Dorsalgia, unspecified: Secondary | ICD-10-CM

## 2015-03-08 DIAGNOSIS — S3992XA Unspecified injury of lower back, initial encounter: Secondary | ICD-10-CM | POA: Diagnosis present

## 2015-03-08 MED ORDER — ACETAMINOPHEN 325 MG PO TABS
650.0000 mg | ORAL_TABLET | Freq: Once | ORAL | Status: AC
  Administered 2015-03-08: 650 mg via ORAL
  Filled 2015-03-08: qty 2

## 2015-03-08 NOTE — Discharge Instructions (Signed)

## 2015-03-08 NOTE — ED Notes (Signed)
MD at bedside. To discuss x-ray results with pt and family

## 2015-03-08 NOTE — ED Notes (Signed)
Pt states pt fell up against a door facing a week ago, bruising noted to lt upper back

## 2015-03-08 NOTE — ED Notes (Signed)
MD at bedside. 

## 2015-03-08 NOTE — ED Provider Notes (Signed)
CSN: [id number]     Arrival date & time 03/08/15  9833 History   First MD Initiated Contact with Patient 03/08/15 0710     Chief Complaint  Patient presents with  . Back Pain     (Consider location/radiation/quality/duration/timing/severity/associated sxs/prior Treatment) Patient is a 81 y.o. male presenting with back pain. The history is provided by the patient and a significant other.  Back Pain Pain severity:  Mild Onset quality:  Sudden Timing:  Sporadic Chronicity:  New Associated symptoms: no chest pain and no fever     [name] is an 81 yo M PMH Mobitz II 2nd degree AV block, s/p pacemaker, dementia and COPD p/w with left sided flank pain. History obtained mostly through his wife.   The pain started after he fell against the door frame about a week ago. He has been complaining of mild pain when he gets up since then until today.  He woke his wife up this morning around 6 AM.  He says the pain is worst.  His wife states that he hasn't been complaining of SOB, chest pain, dysuria, diarrhea, constipation or fevers.    Past Medical History  Diagnosis Date  . Chest discomfort   . Coronary artery disease   . Diabetes mellitus   . Hypertension   . Hyperlipidemia   . Obesities, morbid   . Gallstone pancreatitis     recurrent  . TIA (transient ischemic attack)   . Bacteremia due to vancomycin resistant Enterococcus   . GERD (gastroesophageal reflux disease)   . Hearing impairment   . Myocardial infarction, anterior wall, subsequent care   . Second degree AV block, Mobitz type II     s/p PPM by JA 4/12  . ARF (acute renal failure)   . Cholangitis   . Duodenal ulcer   . GI bleed   . Anemia associated with acute blood loss   . 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 Dementia in Alzheimer's disease with delirium   . Cancer     precancer skin lesions   Past Surgical History  Procedure Laterality Date  . Cholecystectomy    . Pacemaker  placement  01/01/11    implanted by JA for mobitz II AV block  . Pancreatic stent    . Ercp N/A 05/30/2014    Procedure: ENDOSCOPIC RETROGRADE CHOLANGIOPANCREATOGRAPHY (ERCP);  Surgeon: Ladene Artist, MD;  Location: [facility] ENDOSCOPY;  Service: Endoscopy;  Laterality: N/A;  . Ercp N/A 07/09/2014    Procedure: ENDOSCOPIC RETROGRADE CHOLANGIOPANCREATOGRAPHY (ERCP);  Surgeon: Beryle Beams, MD;  Location: [facility] ENDOSCOPY;  Service: Endoscopy;  Laterality: N/A;  . Biliary stent placement N/A 07/09/2014    Procedure: BILIARY STENT PLACEMENT;  Surgeon: Beryle Beams, MD;  Location: [facility] ENDOSCOPY;  Service: Endoscopy;  Laterality: N/A;   Family History  Problem Relation Age of Onset  . Hypertension Other    History  Substance Use Topics  . Smoking status: Former Smoker -- 1.00 packs/day for 57 years    Types: Cigarettes    Quit date: 01/12/1996  . Smokeless tobacco: Never Used  . Alcohol Use: No    Review of Systems  Constitutional: Negative for fever, chills and diaphoresis.  Respiratory: Negative for cough.   Cardiovascular: Negative for chest pain.  Gastrointestinal: Negative for nausea, vomiting, diarrhea and constipation.  Musculoskeletal: Positive for back pain.  Skin: Positive for color change. Negative for rash and wound.      Allergies  Doxycycline and Penicillins  Home Medications   Prior to Admission medications   Medication Sig Start Date End Date Taking? Authorizing Provider  albuterol (PROVENTIL HFA;VENTOLIN HFA) 108 (90 BASE) MCG/ACT inhaler Inhale 2 puffs into the lungs every 4 (four) hours as needed. For shortness of breath    Historical Provider, MD  aspirin 325 MG tablet Take 162.5 mg by mouth daily.     Historical Provider, MD  donepezil (ARICEPT) 10 MG tablet Take 10 mg by mouth at bedtime.    Historical Provider, MD  folic acid (FOLVITE) 1 MG tablet Take 1 mg by mouth daily.    Historical Provider, MD  hydrochlorothiazide (HYDRODIURIL) 25 MG tablet Take 12.5 mg by  mouth every morning.     Historical Provider, MD  losartan (COZAAR) 50 MG tablet Take 50 mg by mouth every morning.    Historical Provider, MD  MAGNESIUM SULFATE PO Take 1 tablet by mouth daily.    Historical Provider, MD  Memantine HCl ER (NAMENDA XR) 28 MG CP24 Take 28 mg by mouth every morning.     Historical Provider, MD  metoprolol (LOPRESSOR) 50 MG tablet Take 1 tablet (50 mg total) by mouth 2 (two) times daily. 09/22/14   [name], MD  Multiple Vitamins-Minerals (CENTRUM SILVER PO) Take 1 tablet by mouth every morning.     Historical Provider, MD  Omega-3 Fatty Acids (FISH OIL PO) Take 2 capsules by mouth daily.    Historical Provider, MD  omeprazole (PRILOSEC OTC) 20 MG tablet Take 20 mg by mouth 2 (two) times daily.    Historical Provider, MD  polyethylene glycol (MIRALAX / GLYCOLAX) packet Take 17 g by mouth daily.    Historical Provider, MD  simvastatin (ZOCOR) 20 MG tablet Take 20 mg by mouth every evening.     Historical Provider, MD  SYMBICORT 160-4.5 MCG/ACT inhaler INHALE 1 PUFF INTO THE LUNGS TWICE A DAY 10/13/14   Chesley Mires, MD  tiotropium (SPIRIVA) 18 MCG inhalation capsule Place 18 mcg into inhaler and inhale daily.    Historical Provider, MD  triamcinolone cream (KENALOG) 0.1 % Apply 1 application topically daily as needed (itching).  09/01/14   Historical Provider, MD   BP 178/97 mmHg  Pulse 82  Temp(Src) 98.3 F (36.8 C) (Oral)  Resp 22  SpO2 94% Physical Exam  Constitutional: He appears well-developed. No distress.  HENT:  Head: Normocephalic and atraumatic.  Eyes: Conjunctivae and EOM are normal.  Neck: Normal range of motion.  Cardiovascular: Normal rate and normal heart sounds.   Pulmonary/Chest: Effort normal. He has no wheezes. He exhibits no tenderness.  Abdominal: Soft. Bowel sounds are normal. He exhibits no distension. There is no tenderness. There is no rebound.  Musculoskeletal:       Back:  Neurovascularly intact  Normal grip strength    Normal  Back: ecchymosis along his left flank.  TTP along bruising and his left mid axilla line  No crepitus  No pain with shoulder flexion or abduction   Neurological: He is alert.  Skin: Skin is warm.    ED Course  Procedures (including critical care time) Labs Review Labs Reviewed - No data to display  Imaging Review Dg Ribs Unilateral W/chest Left  03/08/2015   CLINICAL DATA:  [place] against a door 1 week ago, left upper back bruising  EXAM: LEFT RIBS AND CHEST - 3+ VIEW  COMPARISON:  05/28/2014  FINDINGS: Four views left ribs submitted. No acute infiltrate or pulmonary edema. No pneumothorax. Borderline cardiomegaly again noted.  Mild hyperinflation. Stable dual lead cardiac pacemaker position. Question subtle nondisplaced fracture of the left ninth rib. No pneumothorax.  IMPRESSION: No acute infiltrate or pulmonary edema. Stable dual lead cardiac pacemaker position. Question subtle nondisplaced fracture of the left ninth rib. No pneumothorax. Mild hyperinflation.   Electronically Signed   By: Lahoma Crocker M.D.   On: 03/08/2015 08:19   Dg Lumbar Spine Complete  03/08/2015   CLINICAL DATA:  Fall against a door 1 week ago, left upper back bruising  EXAM: LUMBAR SPINE - COMPLETE 4+ VIEW  COMPARISON:  05/28/2014  FINDINGS: Five views of lumbar spine submitted. No acute fracture or subluxation. Again noted anterior spurring lower endplate of L2, upper and lower endplate of L3 and L4 vertebral body. Mild anterior spurring upper endplate of L5 vertebral body. Mild disc space flattening with anterior spurring at L3-L4 and L4-L5 level. Atherosclerotic calcifications of abdominal aorta and iliac arteries again noted. Again noted aneurysmal dilatation of distal abdominal aorta measures 3.1 cm in diameter without change from prior exam.  IMPRESSION: No acute fracture or subluxation. Degenerative changes as described above. Atherosclerotic calcifications of abdominal aorta and iliac arteries.    Electronically Signed   By: Lahoma Crocker M.D.   On: 03/08/2015 08:22     EKG Interpretation None      Medications  acetaminophen (TYLENOL) tablet 650 mg (650 mg Oral Given 03/08/15 0903)     MDM   Final diagnoses:  Back pain   Mr. Gramling is p/w with back pain most likely for a 9th rib fracture. He was given tylenol and was able to sleep. Will advise tylenol for pain going forward. Patient and wife agreeable with plan and discharge.   Rosemarie Ax, MD PGY-2, [facility] 03/08/2015, 9:49 AM      Rosemarie Ax, MD 03/08/15 [place], MD 03/09/15 [phone]

## 2015-03-08 NOTE — ED Notes (Signed)
Bruised noted at top of left scapula down to left flank, light yellow/ purplish in color. Tender to touch

## 2015-03-08 NOTE — ED Notes (Signed)
Wife in room with pt, side rails x 2 up, fall risk identifier placed on pt.

## 2015-03-23 ENCOUNTER — Ambulatory Visit (INDEPENDENT_AMBULATORY_CARE_PROVIDER_SITE_OTHER): Payer: Medicare Other | Admitting: Cardiology

## 2015-03-23 ENCOUNTER — Encounter: Payer: Self-pay | Admitting: Cardiology

## 2015-03-23 VITALS — BP 138/72 | HR 76 | Ht 65.0 in | Wt 193.9 lb

## 2015-03-23 DIAGNOSIS — N189 Chronic kidney disease, unspecified: Secondary | ICD-10-CM | POA: Diagnosis not present

## 2015-03-23 DIAGNOSIS — N182 Chronic kidney disease, stage 2 (mild): Secondary | ICD-10-CM

## 2015-03-23 DIAGNOSIS — I5022 Chronic systolic (congestive) heart failure: Secondary | ICD-10-CM

## 2015-03-23 DIAGNOSIS — Z950 Presence of cardiac pacemaker: Secondary | ICD-10-CM

## 2015-03-23 DIAGNOSIS — I441 Atrioventricular block, second degree: Secondary | ICD-10-CM | POA: Diagnosis not present

## 2015-03-23 DIAGNOSIS — I252 Old myocardial infarction: Secondary | ICD-10-CM

## 2015-03-23 NOTE — Progress Notes (Signed)
[name] Date of Birth: [birth year]   History of Present Illness: Mr. [name] is seen for followup of CAD and pacemaker. He is status post acute anterior myocardial infarction in April of 2012 treated with direct stenting of the mid LAD. He also had second-degree heart block with significant bradycardia that was symptomatic and he underwent a permanent pacemaker. He is seen today in follow up with his wife. He is extremely hard of hearing. He has lost 13 lbs. His wife states he  is very sedentary. He is not eating well. He was admitted in September 2015 with bacteremia and cholangitis. He was treated with IV antibiotics and had a biliary stent placed. He has recovered well from that and denies any abdominal complaints. States chronic SOB is unchanged.  No chest pain.  Current Outpatient Prescriptions on File Prior to Visit  Medication Sig Dispense Refill  . albuterol (PROVENTIL HFA;VENTOLIN HFA) 108 (90 BASE) MCG/ACT inhaler Inhale 2 puffs into the lungs every 4 (four) hours as needed. For shortness of breath    . aspirin 325 MG tablet Take 162.5 mg by mouth daily.     Marland Kitchen donepezil (ARICEPT) 10 MG tablet Take 10 mg by mouth at bedtime.    . folic acid (FOLVITE) 1 MG tablet Take 1 mg by mouth daily.    . hydrochlorothiazide (HYDRODIURIL) 25 MG tablet Take 12.5 mg by mouth every morning.     Marland Kitchen losartan (COZAAR) 50 MG tablet Take 50 mg by mouth every morning.    Marland Kitchen MAGNESIUM SULFATE PO Take 1 tablet by mouth daily.    . metoprolol (LOPRESSOR) 50 MG tablet Take 1 tablet (50 mg total) by mouth 2 (two) times daily. 180 tablet 3  . Multiple Vitamins-Minerals (CENTRUM SILVER PO) Take 1 tablet by mouth every morning.     . Omega-3 Fatty Acids (FISH OIL PO) Take 2 capsules by mouth daily.    Marland Kitchen omeprazole (PRILOSEC OTC) 20 MG tablet Take 20 mg by mouth 2 (two) times daily.    . polyethylene glycol (MIRALAX / GLYCOLAX) packet Take 17 g by mouth daily.    . simvastatin (ZOCOR) 20 MG tablet Take 20 mg  by mouth every evening.     . SYMBICORT 160-4.5 MCG/ACT inhaler INHALE 1 PUFF INTO THE LUNGS TWICE A DAY 1 Inhaler 0  . tiotropium (SPIRIVA) 18 MCG inhalation capsule Place 18 mcg into inhaler and inhale daily.    Marland Kitchen triamcinolone cream (KENALOG) 0.1 % Apply 1 application topically daily as needed (itching).   5   No current facility-administered medications on file prior to visit.    Allergies  Allergen Reactions  . Doxycycline Other (See Comments)    Unknown- patient doesn't remember  . Penicillins Rash    Past Medical History  Diagnosis Date  . Chest discomfort   . Coronary artery disease   . Diabetes mellitus   . Hypertension   . Hyperlipidemia   . Obesities, morbid   . Gallstone pancreatitis     recurrent  . TIA (transient ischemic attack)   . Bacteremia due to vancomycin resistant Enterococcus   . GERD (gastroesophageal reflux disease)   . Hearing impairment   . Myocardial infarction, anterior wall, subsequent care   . Second degree AV block, Mobitz type II     s/p PPM by JA 4/12  . ARF (acute renal failure)   . Cholangitis   . Duodenal ulcer   . GI bleed   . Anemia associated with acute  blood loss   . 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 Dementia in Alzheimer's disease with delirium   . Cancer     precancer skin lesions    Past Surgical History  Procedure Laterality Date  . Cholecystectomy    . Pacemaker placement  01/01/11    implanted by JA for mobitz II AV block  . Pancreatic stent    . Ercp N/A 05/30/2014    Procedure: ENDOSCOPIC RETROGRADE CHOLANGIOPANCREATOGRAPHY (ERCP);  Surgeon: Ladene Artist, MD;  Location: [facility] ENDOSCOPY;  Service: Endoscopy;  Laterality: N/A;  . Ercp N/A 07/09/2014    Procedure: ENDOSCOPIC RETROGRADE CHOLANGIOPANCREATOGRAPHY (ERCP);  Surgeon: Beryle Beams, MD;  Location: [facility] ENDOSCOPY;  Service: Endoscopy;  Laterality: N/A;  . Biliary stent placement N/A 07/09/2014    Procedure: BILIARY STENT  PLACEMENT;  Surgeon: Beryle Beams, MD;  Location: [facility] ENDOSCOPY;  Service: Endoscopy;  Laterality: N/A;    History  Smoking status  . Former Smoker -- 1.00 packs/day for 57 years  . Types: Cigarettes  . Quit date: 01/12/1996  Smokeless tobacco  . Never Used    History  Alcohol Use No    Family History  Problem Relation Age of Onset  . Hypertension Other     Review of Systems: The review of systems is as noted in HPI. He is very hard of hearing.  All other systems were reviewed and are negative.  Physical Exam: BP 138/72 mmHg  Pulse 76  Ht 5\' 5"  (1.651 m)  Wt 87.952 kg (193 lb 14.4 oz)  BMI 32.27 kg/m2 He is an elderly overweight white male who is very hard of hearing. He is in no distress. HEENT exam reveals he is wearing hearing aid. His pupils are equal round and reactive. Sclera are clear. Oropharynx is clear. Neck is without JVD, adenopathy, or bruits. Lungs are clear with diminished breath sounds throughout, few wheezes. Cardiac exam reveals a regular rate and rhythm without gallop, murmur, or click. His pacemaker site is normal. Abdomen is soft and nontender without masses or hepatosplenomegaly. Femoral and pedal pulses are 2+ and symmetric. He has no edema. Neurologic exam is nonfocal.  LABORATORY DATA:  Pacemaker check in April was satisfactory.  Assessment / Plan: 1. Coronary disease status post anterior myocardial infarction treated with stenting of the LAD. He is asymptomatic. We will continue with aspirin, metoprolol, and statin therapy.   2. Severe COPD.  3. Morbid obesity. He is losing weight   4. AV block s/p pacemaker implant. Continue followup in pacemaker clinic.   5. CBD stones with bacteremia. S/p biliary stent. Resolved.  4. Hyperlipidemia. Continue statin therapy.  5. Chronic diastolic congestive heart failure. He appears to be euvolemic on his current medical therapy.

## 2015-03-23 NOTE — Patient Instructions (Signed)
Continue your current therapy  I will see you in 6 months.   

## 2015-04-13 ENCOUNTER — Encounter: Payer: Self-pay | Admitting: Internal Medicine

## 2015-04-13 ENCOUNTER — Ambulatory Visit (INDEPENDENT_AMBULATORY_CARE_PROVIDER_SITE_OTHER): Payer: Medicare Other | Admitting: *Deleted

## 2015-04-13 DIAGNOSIS — I441 Atrioventricular block, second degree: Secondary | ICD-10-CM | POA: Diagnosis not present

## 2015-04-13 NOTE — Progress Notes (Signed)
Remote pacemaker transmission.   

## 2015-04-22 LAB — CUP PACEART REMOTE DEVICE CHECK
Battery Remaining Percentage: 91 %
Brady Statistic AP VP Percent: 1 %
Brady Statistic AP VS Percent: 2.9 %
Brady Statistic AS VP Percent: 1 %
Brady Statistic RA Percent Paced: 2.8 %
Date Time Interrogation Session: 20160720074315
Lead Channel Impedance Value: 380 Ohm
Lead Channel Pacing Threshold Amplitude: 0.5 V
Lead Channel Sensing Intrinsic Amplitude: 12 mV
Lead Channel Sensing Intrinsic Amplitude: 4.1 mV
Lead Channel Setting Pacing Amplitude: 1.5 V
Lead Channel Setting Pacing Amplitude: 2.25 V
Lead Channel Setting Pacing Pulse Width: 0.4 ms
MDC IDC MSMT BATTERY REMAINING LONGEVITY: 120 mo
MDC IDC MSMT BATTERY VOLTAGE: 2.95 V
MDC IDC MSMT LEADCHNL RA PACING THRESHOLD PULSEWIDTH: 0.4 ms
MDC IDC MSMT LEADCHNL RV IMPEDANCE VALUE: 510 Ohm
MDC IDC MSMT LEADCHNL RV PACING THRESHOLD AMPLITUDE: 2 V
MDC IDC MSMT LEADCHNL RV PACING THRESHOLD PULSEWIDTH: 0.4 ms
MDC IDC PG SERIAL: 7228914
MDC IDC SET LEADCHNL RV SENSING SENSITIVITY: 4 mV
MDC IDC STAT BRADY AS VS PERCENT: 97 %
MDC IDC STAT BRADY RV PERCENT PACED: 1 %
Pulse Gen Model: 2210

## 2015-05-09 ENCOUNTER — Encounter: Payer: Self-pay | Admitting: Cardiology

## 2015-05-11 ENCOUNTER — Other Ambulatory Visit: Payer: Self-pay | Admitting: Diagnostic Neuroimaging

## 2015-06-14 ENCOUNTER — Encounter (HOSPITAL_BASED_OUTPATIENT_CLINIC_OR_DEPARTMENT_OTHER): Payer: Self-pay

## 2015-06-14 ENCOUNTER — Emergency Department (HOSPITAL_BASED_OUTPATIENT_CLINIC_OR_DEPARTMENT_OTHER): Payer: Medicare Other

## 2015-06-14 ENCOUNTER — Emergency Department (HOSPITAL_BASED_OUTPATIENT_CLINIC_OR_DEPARTMENT_OTHER)
Admission: EM | Admit: 2015-06-14 | Discharge: 2015-06-14 | Disposition: A | Discharge status: Home / Self Care | Payer: Medicare Other | Attending: Emergency Medicine | Admitting: Emergency Medicine

## 2015-06-14 DIAGNOSIS — H9190 Unspecified hearing loss, unspecified ear: Secondary | ICD-10-CM | POA: Insufficient documentation

## 2015-06-14 DIAGNOSIS — K219 Gastro-esophageal reflux disease without esophagitis: Secondary | ICD-10-CM | POA: Insufficient documentation

## 2015-06-14 DIAGNOSIS — I2510 Atherosclerotic heart disease of native coronary artery without angina pectoris: Secondary | ICD-10-CM | POA: Insufficient documentation

## 2015-06-14 DIAGNOSIS — I252 Old myocardial infarction: Secondary | ICD-10-CM | POA: Diagnosis not present

## 2015-06-14 DIAGNOSIS — J449 Chronic obstructive pulmonary disease, unspecified: Secondary | ICD-10-CM | POA: Diagnosis not present

## 2015-06-14 DIAGNOSIS — Z79899 Other long term (current) drug therapy: Secondary | ICD-10-CM | POA: Insufficient documentation

## 2015-06-14 DIAGNOSIS — Z87448 Personal history of other diseases of urinary system: Secondary | ICD-10-CM | POA: Diagnosis not present

## 2015-06-14 DIAGNOSIS — I10 Essential (primary) hypertension: Secondary | ICD-10-CM | POA: Diagnosis not present

## 2015-06-14 DIAGNOSIS — F0281 Dementia in other diseases classified elsewhere with behavioral disturbance: Secondary | ICD-10-CM | POA: Diagnosis not present

## 2015-06-14 DIAGNOSIS — D62 Acute posthemorrhagic anemia: Secondary | ICD-10-CM | POA: Diagnosis not present

## 2015-06-14 DIAGNOSIS — Z7982 Long term (current) use of aspirin: Secondary | ICD-10-CM | POA: Insufficient documentation

## 2015-06-14 DIAGNOSIS — Z950 Presence of cardiac pacemaker: Secondary | ICD-10-CM | POA: Insufficient documentation

## 2015-06-14 DIAGNOSIS — E119 Type 2 diabetes mellitus without complications: Secondary | ICD-10-CM | POA: Diagnosis not present

## 2015-06-14 DIAGNOSIS — Z85828 Personal history of other malignant neoplasm of skin: Secondary | ICD-10-CM | POA: Diagnosis not present

## 2015-06-14 DIAGNOSIS — F05 Delirium due to known physiological condition: Secondary | ICD-10-CM | POA: Insufficient documentation

## 2015-06-14 DIAGNOSIS — Z87891 Personal history of nicotine dependence: Secondary | ICD-10-CM | POA: Insufficient documentation

## 2015-06-14 DIAGNOSIS — Z8673 Personal history of transient ischemic attack (TIA), and cerebral infarction without residual deficits: Secondary | ICD-10-CM | POA: Insufficient documentation

## 2015-06-14 DIAGNOSIS — R42 Dizziness and giddiness: Secondary | ICD-10-CM | POA: Diagnosis present

## 2015-06-14 DIAGNOSIS — Z8619 Personal history of other infectious and parasitic diseases: Secondary | ICD-10-CM | POA: Insufficient documentation

## 2015-06-14 DIAGNOSIS — Z880 Allergy status to penicillin: Secondary | ICD-10-CM | POA: Diagnosis not present

## 2015-06-14 DIAGNOSIS — E785 Hyperlipidemia, unspecified: Secondary | ICD-10-CM | POA: Diagnosis not present

## 2015-06-14 DIAGNOSIS — G309 Alzheimer's disease, unspecified: Secondary | ICD-10-CM | POA: Insufficient documentation

## 2015-06-14 LAB — CBC WITH DIFFERENTIAL/PLATELET
BASOS ABS: 0 10*3/uL (ref 0.0–0.1)
Basophils Relative: 0 %
EOS PCT: 1 %
Eosinophils Absolute: 0.1 10*3/uL (ref 0.0–0.7)
HCT: 41.1 % (ref 39.0–52.0)
Hemoglobin: 13.6 g/dL (ref 13.0–17.0)
LYMPHS ABS: 3 10*3/uL (ref 0.7–4.0)
LYMPHS PCT: 32 %
MCH: 31.5 pg (ref 26.0–34.0)
MCHC: 33.1 g/dL (ref 30.0–36.0)
MCV: 95.1 fL (ref 78.0–100.0)
MONO ABS: 0.5 10*3/uL (ref 0.1–1.0)
MONOS PCT: 5 %
Neutro Abs: 5.7 10*3/uL (ref 1.7–7.7)
Neutrophils Relative %: 62 %
PLATELETS: 258 10*3/uL (ref 150–400)
RBC: 4.32 MIL/uL (ref 4.22–5.81)
RDW: 13.1 % (ref 11.5–15.5)
WBC: 9.3 10*3/uL (ref 4.0–10.5)

## 2015-06-14 LAB — COMPREHENSIVE METABOLIC PANEL
ALBUMIN: 3.2 g/dL — AB (ref 3.5–5.0)
ALK PHOS: 132 U/L — AB (ref 38–126)
ALT: 13 U/L — ABNORMAL LOW (ref 17–63)
AST: 25 U/L (ref 15–41)
Anion gap: 11 (ref 5–15)
BILIRUBIN TOTAL: 0.7 mg/dL (ref 0.3–1.2)
BUN: 9 mg/dL (ref 6–20)
CO2: 29 mmol/L (ref 22–32)
Calcium: 9 mg/dL (ref 8.9–10.3)
Chloride: 98 mmol/L — ABNORMAL LOW (ref 101–111)
Creatinine, Ser: 1.11 mg/dL (ref 0.61–1.24)
GFR calc Af Amer: >60 mL/min (ref >60)
GFR calc non Af Amer: 59 mL/min — ABNORMAL LOW (ref >60)
GLUCOSE: 176 mg/dL — AB (ref 65–99)
POTASSIUM: 3.7 mmol/L (ref 3.5–5.1)
Sodium: 138 mmol/L (ref 135–145)
TOTAL PROTEIN: 6.6 g/dL (ref 6.5–8.1)

## 2015-06-14 LAB — URINALYSIS, ROUTINE W REFLEX MICROSCOPIC
BILIRUBIN URINE: NEGATIVE
GLUCOSE, UA: NEGATIVE mg/dL
HGB URINE DIPSTICK: NEGATIVE
KETONES UR: NEGATIVE mg/dL
Leukocytes, UA: NEGATIVE
Nitrite: NEGATIVE
PH: 7 (ref 5.0–8.0)
Protein, ur: NEGATIVE mg/dL
Specific Gravity, Urine: 1.008 (ref 1.005–1.030)
Urobilinogen, UA: 2 mg/dL — ABNORMAL HIGH (ref 0.0–1.0)

## 2015-06-14 NOTE — ED Notes (Signed)
Patient transported to CT via stretcher, sr x 2 up  

## 2015-06-14 NOTE — ED Provider Notes (Signed)
CSN: [id number]     Arrival date & time 06/14/15  0650 History   First MD Initiated Contact with Patient 06/14/15 [phone]     Chief Complaint  Patient presents with  . Dizziness    Low 5 caveat due to dementia. (Consider location/radiation/quality/duration/timing/severity/associated sxs/prior Treatment) Patient is a 81 y.o. male presenting with dizziness. The history is provided by the patient and a relative.  Dizziness  patient has reportedly had dizziness. The feeling that the room is spinning. May began a couple days ago. Patient states began longer than that but his wife says it began a couple days ago. Patient required assistance to get to the bed but patient's wife states he was up and walking last night. Denies headache. Does have some urinary urgency. He has a pacemaker.  Past Medical History  Diagnosis Date  . Chest discomfort   . Coronary artery disease   . Diabetes mellitus   . Hypertension   . Hyperlipidemia   . Obesities, morbid   . Gallstone pancreatitis     recurrent  . TIA (transient ischemic attack)   . Bacteremia due to vancomycin resistant Enterococcus   . GERD (gastroesophageal reflux disease)   . Hearing impairment   . Myocardial infarction, anterior wall, subsequent care   . Second degree AV block, Mobitz type II     s/p PPM by JA 4/12  . ARF (acute renal failure)   . Cholangitis   . Duodenal ulcer   . GI bleed   . Anemia associated with acute blood loss   . 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 Dementia in Alzheimer's disease with delirium   . Cancer     precancer skin lesions   Past Surgical History  Procedure Laterality Date  . Cholecystectomy    . Pacemaker placement  01/01/11    implanted by JA for mobitz II AV block  . Pancreatic stent    . Ercp N/A 05/30/2014    Procedure: ENDOSCOPIC RETROGRADE CHOLANGIOPANCREATOGRAPHY (ERCP);  Surgeon: Ladene Artist, MD;  Location: [facility] ENDOSCOPY;  Service: Endoscopy;   Laterality: N/A;  . Ercp N/A 07/09/2014    Procedure: ENDOSCOPIC RETROGRADE CHOLANGIOPANCREATOGRAPHY (ERCP);  Surgeon: Beryle Beams, MD;  Location: [facility] ENDOSCOPY;  Service: Endoscopy;  Laterality: N/A;  . Biliary stent placement N/A 07/09/2014    Procedure: BILIARY STENT PLACEMENT;  Surgeon: Beryle Beams, MD;  Location: [facility] ENDOSCOPY;  Service: Endoscopy;  Laterality: N/A;   Family History  Problem Relation Age of Onset  . Hypertension Other    Social History  Substance Use Topics  . Smoking status: Former Smoker -- 1.00 packs/day for 57 years    Types: Cigarettes    Quit date: 01/12/1996  . Smokeless tobacco: Never Used  . Alcohol Use: No    Review of Systems  Unable to perform ROS Neurological: Positive for dizziness.      Allergies  Doxycycline and Penicillins  Home Medications   Prior to Admission medications   Medication Sig Start Date End Date Taking? Authorizing Provider  albuterol (PROVENTIL HFA;VENTOLIN HFA) 108 (90 BASE) MCG/ACT inhaler Inhale 2 puffs into the lungs every 4 (four) hours as needed. For shortness of breath    Historical Provider, MD  aspirin 325 MG tablet Take 162.5 mg by mouth daily.     Historical Provider, MD  donepezil (ARICEPT) 10 MG tablet TAKE 1 TABLET (10 MG TOTAL) BY MOUTH AT BEDTIME. 05/11/15   Penni Bombard, MD  folic acid (  FOLVITE) 1 MG tablet Take 1 mg by mouth daily.    Historical Provider, MD  hydrochlorothiazide (HYDRODIURIL) 25 MG tablet Take 12.5 mg by mouth every morning.     Historical Provider, MD  losartan (COZAAR) 50 MG tablet Take 50 mg by mouth every morning.    Historical Provider, MD  MAGNESIUM SULFATE PO Take 1 tablet by mouth daily.    Historical Provider, MD  metoprolol (LOPRESSOR) 50 MG tablet Take 1 tablet (50 mg total) by mouth 2 (two) times daily. 09/22/14   [name], MD  Multiple Vitamins-Minerals (CENTRUM SILVER PO) Take 1 tablet by mouth every morning.     Historical Provider, MD  Omega-3 Fatty Acids  (FISH OIL PO) Take 2 capsules by mouth daily.    Historical Provider, MD  omeprazole (PRILOSEC OTC) 20 MG tablet Take 20 mg by mouth 2 (two) times daily.    Historical Provider, MD  omeprazole (PRILOSEC) 20 MG capsule Take 20 mg by mouth 2 (two) times daily. 03/01/15   Historical Provider, MD  polyethylene glycol (MIRALAX / GLYCOLAX) packet Take 17 g by mouth daily.    Historical Provider, MD  simvastatin (ZOCOR) 20 MG tablet Take 20 mg by mouth every evening.     Historical Provider, MD  SYMBICORT 160-4.5 MCG/ACT inhaler INHALE 1 PUFF INTO THE LUNGS TWICE A DAY 10/13/14   Chesley Mires, MD  tiotropium (SPIRIVA) 18 MCG inhalation capsule Place 18 mcg into inhaler and inhale daily.    Historical Provider, MD  triamcinolone cream (KENALOG) 0.1 % Apply 1 application topically daily as needed (itching).  09/01/14   Historical Provider, MD   BP 164/78 mmHg  Pulse 94  Temp(Src) 98 F (36.7 C) (Oral)  Resp 20  Ht 5\' 6"  (1.676 m)  Wt 200 lb (90.719 kg)  BMI 32.30 kg/m2  SpO2 96% Physical Exam  Constitutional: He appears well-developed.  HENT:  Head: Normocephalic.  Bilateral TMs normal.  Neck: Neck supple.  Cardiovascular: Normal rate.   Pulmonary/Chest: Effort normal.  Abdominal: Soft. There is no tenderness.  Neurological: He is alert.  Patient is very hard of hearing. Finger-nose appears intact bilaterally. Good grips bilaterally. Extraocular movements intact.  Skin: Skin is warm.    ED Course  Procedures (including critical care time) Labs Review Labs Reviewed  COMPREHENSIVE METABOLIC PANEL - Abnormal; Notable for the following:    Chloride 98 (*)    Glucose, Bld 176 (*)    Albumin 3.2 (*)    ALT 13 (*)    Alkaline Phosphatase 132 (*)    GFR calc non Af Amer 59 (*)    All other components within normal limits  URINALYSIS, ROUTINE W REFLEX MICROSCOPIC (NOT AT [facility]) - Abnormal; Notable for the following:    Urobilinogen, UA 2.0 (*)    All other components within normal limits  CBC  WITH DIFFERENTIAL/PLATELET    Imaging Review Dg Chest 2 View  06/14/2015   CLINICAL DATA:  Dizziness and chest pain since 06/12/2015.  EXAM: CHEST  2 VIEW  COMPARISON:  Single view of the chest 05/28/2014 in 03/08/2015. CT chest 12/07/2013.  FINDINGS: Pacing device is in place. There is cardiomegaly without edema. The lungs are clear. No pneumothorax or pleural effusion.  IMPRESSION: Cardiomegaly without acute disease.   Electronically Signed   By: Inge Rise M.D.   On: 06/14/2015 08:05   Ct Head Wo Contrast  06/14/2015   CLINICAL DATA:  Dizziness.  EXAM: CT HEAD WITHOUT CONTRAST  TECHNIQUE: Contiguous  axial images were obtained from the base of the skull through the vertex without intravenous contrast.  COMPARISON:  CT scan of December 04, 2013.  FINDINGS: Bony calvarium appears intact. Mild diffuse cortical atrophy is noted. Mild chronic ischemic white matter disease is noted. No mass effect or midline shift is noted. Ventricular size is within normal limits. There is no evidence of mass lesion, hemorrhage or acute infarction.  IMPRESSION: Mild diffuse cortical atrophy. Mild chronic ischemic white matter disease. No acute intracranial abnormality seen.   Electronically Signed   By: Marijo Conception, M.D.   On: 06/14/2015 08:07   I have personally reviewed and evaluated these images and lab results as part of my medical decision-making.   EKG Interpretation   Date/Time:  Tuesday June 14 2015 07:30:47 EDT Ventricular Rate:  92 PR Interval:  196 QRS Duration: 90 QT Interval:  396 QTC Calculation: 489 R Axis:   -79 Text Interpretation:  Normal sinus rhythm Left axis deviation  Anterolateral infarct , age undetermined Abnormal ECG No significant  change since last tracing Reconfirmed by [facility]  MD, Ovid Curd [phone]) on  06/15/2015 4:13:41 PM     ED ECG REPORT   Date: 06/14/2015  Rate: 92  Rhythm: normal sinus rhythm  QRS Axis: left  Intervals: normal  ST/T Wave abnormalities:  normal  Conduction Disutrbances:none  Narrative Interpretation:   Old EKG Reviewed: unchanged    MDM   Final diagnoses:  Dizziness   Chronic patient with some dizziness.head CT reassuring. Overall nonfocal exam. Does not appear to be infected. Has dementia. Patient has a pacemaker and cannot get an MRI. Patient has had falls were not be a candidate for more anticoagulation. Discussed with patient's wife and patient will be discharged home. Stroke considered but could not be actively treated.    Davonna Belling, MD 06/15/15 [phone]

## 2015-06-14 NOTE — ED Notes (Addendum)
High Fall Risk Interventions in place, sr x 2 up, fall risk bracelet on, yellow sock on, bed in lowest position

## 2015-06-14 NOTE — ED Notes (Signed)
12 Lead EKG done, to EDP for evaluation

## 2015-06-14 NOTE — ED Notes (Signed)
Pt has been c/o dizziness since Sunday, states he "feels like my head is swimming."  Pt has dementia, wife denies any syncopal episodes, denies him c/o chest pain and he has not hit his head or fallen recently.  Grip strength is equal, no facial asymmetry or weakness currently.  He is at his baseline per his wife.

## 2015-06-14 NOTE — ED Notes (Signed)
MD at bedside. 

## 2015-06-14 NOTE — Discharge Instructions (Signed)

## 2015-06-14 NOTE — ED Notes (Signed)
FAST: negative 

## 2015-06-14 NOTE — ED Notes (Signed)
States has been "dizzy" since Sunday, denies chest pain, has had no falls, "just need to be checked out"

## 2015-06-14 NOTE — ED Notes (Signed)
Gait noted to be unsteady

## 2015-06-14 NOTE — ED Notes (Signed)
Returns from radiology, high fall risk measures remain in place, placed back on cont cardiac monitoring

## 2015-07-09 ENCOUNTER — Other Ambulatory Visit: Payer: Self-pay | Admitting: Diagnostic Neuroimaging

## 2015-07-18 ENCOUNTER — Ambulatory Visit (INDEPENDENT_AMBULATORY_CARE_PROVIDER_SITE_OTHER): Payer: Medicare Other | Admitting: *Deleted

## 2015-07-18 DIAGNOSIS — I441 Atrioventricular block, second degree: Secondary | ICD-10-CM | POA: Diagnosis not present

## 2015-07-19 NOTE — Progress Notes (Signed)
Remote pacemaker transmission.   

## 2015-07-21 ENCOUNTER — Encounter: Payer: Self-pay | Admitting: Cardiology

## 2015-07-21 LAB — CUP PACEART REMOTE DEVICE CHECK
Battery Remaining Percentage: 91 %
Battery Voltage: 2.95 V
Brady Statistic AP VP Percent: 1 %
Brady Statistic AP VS Percent: 2.4 %
Brady Statistic AS VP Percent: 1 %
Brady Statistic AS VS Percent: 97 %
Implantable Lead Implant Date: 20120405
Implantable Lead Location: 753860
Lead Channel Pacing Threshold Amplitude: 0.625 V
Lead Channel Pacing Threshold Amplitude: 2.125 V
Lead Channel Pacing Threshold Pulse Width: 0.4 ms
Lead Channel Sensing Intrinsic Amplitude: 3.7 mV
Lead Channel Setting Pacing Amplitude: 2.375
Lead Channel Setting Pacing Pulse Width: 0.4 ms
MDC IDC LEAD IMPLANT DT: 20120405
MDC IDC LEAD LOCATION: 753859
MDC IDC LEAD MODEL: 1948
MDC IDC MSMT BATTERY REMAINING LONGEVITY: 118 mo
MDC IDC MSMT LEADCHNL RA IMPEDANCE VALUE: 350 Ohm
MDC IDC MSMT LEADCHNL RA PACING THRESHOLD PULSEWIDTH: 0.4 ms
MDC IDC MSMT LEADCHNL RV IMPEDANCE VALUE: 480 Ohm
MDC IDC MSMT LEADCHNL RV SENSING INTR AMPL: 12 mV
MDC IDC PG SERIAL: 7228914
MDC IDC SESS DTM: 20161024101418
MDC IDC SET LEADCHNL RA PACING AMPLITUDE: 1.625
MDC IDC SET LEADCHNL RV SENSING SENSITIVITY: 4 mV
MDC IDC STAT BRADY RA PERCENT PACED: 2.3 %
MDC IDC STAT BRADY RV PERCENT PACED: 1 %

## 2015-07-22 ENCOUNTER — Other Ambulatory Visit: Payer: Self-pay

## 2015-08-24 ENCOUNTER — Other Ambulatory Visit: Payer: Self-pay | Admitting: Pulmonary Disease

## 2015-09-14 ENCOUNTER — Encounter: Payer: Self-pay | Admitting: *Deleted

## 2015-09-28 ENCOUNTER — Ambulatory Visit (INDEPENDENT_AMBULATORY_CARE_PROVIDER_SITE_OTHER): Payer: Medicare HMO | Admitting: Cardiology

## 2015-09-28 ENCOUNTER — Encounter: Payer: Self-pay | Admitting: Cardiology

## 2015-09-28 VITALS — BP 107/71 | HR 81 | Ht 68.0 in | Wt 190.4 lb

## 2015-09-28 DIAGNOSIS — I441 Atrioventricular block, second degree: Secondary | ICD-10-CM | POA: Diagnosis not present

## 2015-09-28 DIAGNOSIS — I252 Old myocardial infarction: Secondary | ICD-10-CM

## 2015-09-28 DIAGNOSIS — I5022 Chronic systolic (congestive) heart failure: Secondary | ICD-10-CM | POA: Diagnosis not present

## 2015-09-28 NOTE — Patient Instructions (Signed)
Continue your current therapy  I will see you in 6 months.   

## 2015-09-28 NOTE — Progress Notes (Signed)
[name] Date of Birth: [birth year]   History of Present Illness: [name] is seen for followup of CAD and pacemaker. He is status post acute anterior myocardial infarction in April of 2012 treated with direct stenting of the mid LAD. He also had second-degree heart block with significant bradycardia that was symptomatic and he underwent a permanent pacemaker.  He is seen today in follow up with his wife. He is extremely hard of hearing and has dementia. He has lost 10 lbs. His wife states he  is very sedentary. He is  eating well. States chronic SOB is unchanged.  No chest pain.  Current Outpatient Prescriptions on File Prior to Visit  Medication Sig Dispense Refill  . albuterol (PROVENTIL HFA;VENTOLIN HFA) 108 (90 BASE) MCG/ACT inhaler Inhale 2 puffs into the lungs every 4 (four) hours as needed. For shortness of breath    . aspirin 325 MG tablet Take 162.5 mg by mouth daily.     Marland Kitchen donepezil (ARICEPT) 10 MG tablet TAKE 1 TABLET (10 MG TOTAL) BY MOUTH AT BEDTIME. 30 tablet 0  . folic acid (FOLVITE) 1 MG tablet Take 1 mg by mouth daily.    . hydrochlorothiazide (HYDRODIURIL) 25 MG tablet Take 12.5 mg by mouth every morning.     Marland Kitchen losartan (COZAAR) 50 MG tablet Take 50 mg by mouth every morning.    Marland Kitchen MAGNESIUM SULFATE PO Take 1 tablet by mouth daily.    . metoprolol (LOPRESSOR) 50 MG tablet Take 1 tablet (50 mg total) by mouth 2 (two) times daily. 180 tablet 3  . Multiple Vitamins-Minerals (CENTRUM SILVER PO) Take 1 tablet by mouth every morning.     . Omega-3 Fatty Acids (FISH OIL PO) Take 2 capsules by mouth daily.    Marland Kitchen omeprazole (PRILOSEC OTC) 20 MG tablet Take 20 mg by mouth 2 (two) times daily.    Marland Kitchen omeprazole (PRILOSEC) 20 MG capsule Take 20 mg by mouth 2 (two) times daily.  4  . polyethylene glycol (MIRALAX / GLYCOLAX) packet Take 17 g by mouth daily.    . simvastatin (ZOCOR) 20 MG tablet Take 20 mg by mouth every evening.     . SYMBICORT 160-4.5 MCG/ACT inhaler INHALE 1  PUFF INTO THE LUNGS TWICE A DAY **NEEDS OV** 10.2 Inhaler 2  . tiotropium (SPIRIVA) 18 MCG inhalation capsule Place 18 mcg into inhaler and inhale daily.    Marland Kitchen triamcinolone cream (KENALOG) 0.1 % Apply 1 application topically daily as needed (itching).   5   No current facility-administered medications on file prior to visit.    Allergies  Allergen Reactions  . Doxycycline Other (See Comments)    Unknown- patient doesn't remember  . Penicillins Rash    Past Medical History  Diagnosis Date  . Chest discomfort   . Coronary artery disease   . Diabetes mellitus   . Hypertension   . Hyperlipidemia   . Obesities, morbid ([place])   . Gallstone pancreatitis     recurrent  . TIA (transient ischemic attack)   . Bacteremia due to vancomycin resistant Enterococcus   . GERD (gastroesophageal reflux disease)   . Hearing impairment   . Myocardial infarction, anterior wall, subsequent care   . Second degree AV block, Mobitz type II     s/p PPM by JA 4/12  . ARF (acute renal failure) ([place])   . Cholangitis   . Duodenal ulcer   . GI bleed   . Anemia associated with acute blood loss   .  COPD (chronic obstructive pulmonary disease) with chronic bronchitis ([place])   . Dementia in Alzheimer's disease with delirium   . Cancer [facility])     precancer skin lesions    Past Surgical History  Procedure Laterality Date  . Cholecystectomy    . Pacemaker placement  01/01/11    implanted by JA for mobitz II AV block  . Pancreatic stent    . Ercp N/A 05/30/2014    Procedure: ENDOSCOPIC RETROGRADE CHOLANGIOPANCREATOGRAPHY (ERCP);  Surgeon: Ladene Artist, MD;  Location: [facility] ENDOSCOPY;  Service: Endoscopy;  Laterality: N/A;  . Ercp N/A 07/09/2014    Procedure: ENDOSCOPIC RETROGRADE CHOLANGIOPANCREATOGRAPHY (ERCP);  Surgeon: Beryle Beams, MD;  Location: [facility] ENDOSCOPY;  Service: Endoscopy;  Laterality: N/A;  . Biliary stent placement N/A 07/09/2014    Procedure: BILIARY STENT PLACEMENT;  Surgeon: Beryle Beams,  MD;  Location: [facility] ENDOSCOPY;  Service: Endoscopy;  Laterality: N/A;    History  Smoking status  . Former Smoker -- 1.00 packs/day for 57 years  . Types: Cigarettes  . Quit date: 01/12/1996  Smokeless tobacco  . Never Used    History  Alcohol Use No    Family History  Problem Relation Age of Onset  . Hypertension Other   . Sudden Cardiac Death Father   . Cerebral aneurysm Mother     Review of Systems: The review of systems is as noted in HPI. He is very hard of hearing.  All other systems were reviewed and are negative.  Physical Exam: BP 107/71 mmHg  Pulse 81  Ht 5\' 8"  (1.727 m)  Wt 86.365 kg (190 lb 6.4 oz)  BMI 28.96 kg/m2 He is an elderly overweight white male who is very hard of hearing. He is in no distress. HEENT exam reveals he is wearing hearing aid. His pupils are equal round and reactive. Sclera are clear. Oropharynx is clear. Neck is without JVD, adenopathy, or bruits. Lungs are clear with diminished breath sounds throughout, few wheezes. Cardiac exam reveals a regular rate and rhythm with gr 1-2/6 SEM RUSB. His pacemaker site is normal. Abdomen is soft and nontender without masses or hepatosplenomegaly. Femoral and pedal pulses are 2+ and symmetric. He has no edema. Neurologic exam is nonfocal.  LABORATORY DATA:  Pacemaker check in October was satisfactory.  Assessment / Plan: 1. Coronary disease status post anterior myocardial infarction in 2012 treated with stenting of the LAD. He is asymptomatic. We will continue with aspirin, metoprolol, and statin therapy.   2. Severe COPD.  3. Morbid obesity. He is losing weight   4. AV block s/p pacemaker implant. Continue followup in pacemaker clinic.   5. Hyperlipidemia. Continue statin therapy.  6. Chronic diastolic congestive heart failure. He appears to be euvolemic on his current medical therapy.  I will follow up in 6 months. Labs followed by primary care.

## 2015-10-14 ENCOUNTER — Other Ambulatory Visit: Payer: Self-pay | Admitting: *Deleted

## 2015-10-14 MED ORDER — METOPROLOL TARTRATE 50 MG PO TABS: 50.0000 mg | ORAL_TABLET | Freq: Two times a day (BID) | ORAL | Status: DC

## 2015-10-15 IMAGING — DX DG CHEST 1V PORT
1 series · 1 of 1 positions shown · non-contrast
Comparison: Portable exam 5355 hr compared to 10/29/2012.

CLINICAL DATA: Shortness of breath, fatigue, altered mental status,
history hypertension, coronary artery disease, diabetes, COPD

EXAM:
PORTABLE CHEST - 1 VIEW

[portable]
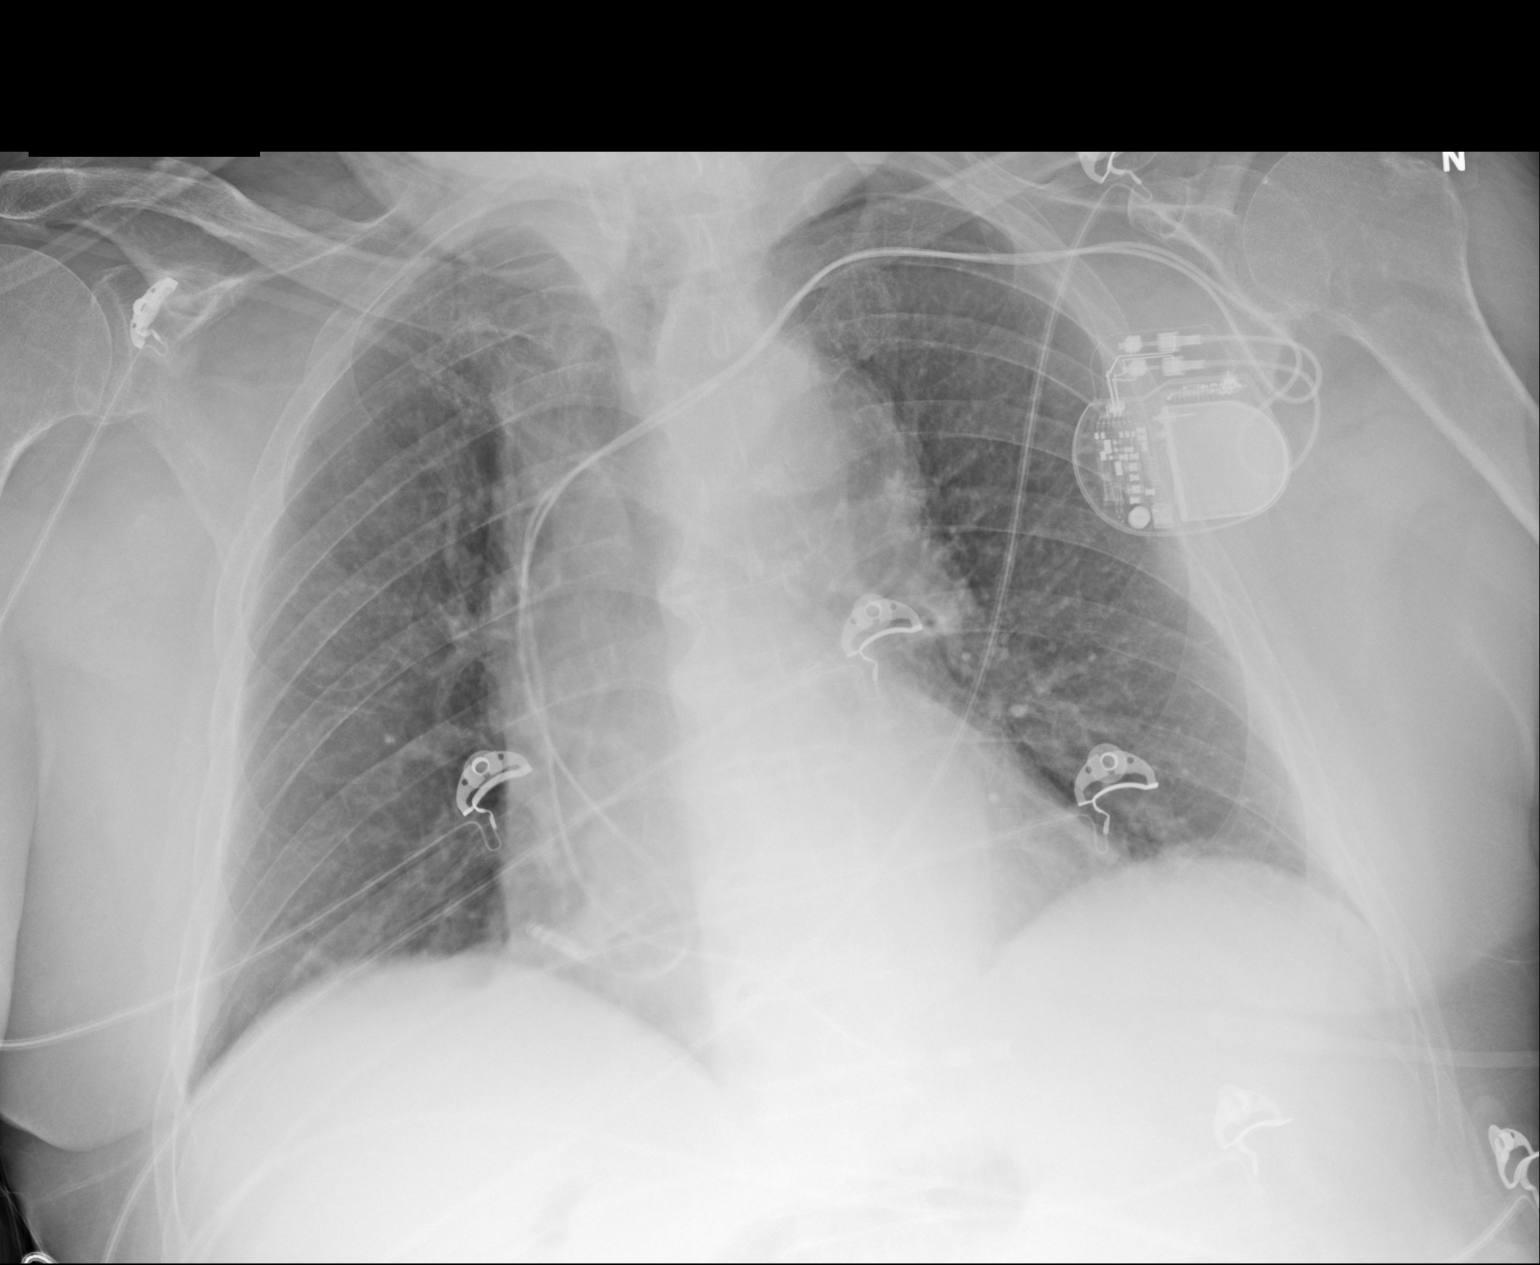

[1 of 1 positions shown; findings below may reference images not displayed]

FINDINGS: Left subclavian transvenous pacemaker leads project over right
atrium right ventricle.

Normal heart size, mediastinal contours and pulmonary vascularity.

Lungs clear.

No pleural effusion or pneumothorax.

Bones demineralized.
IMPRESSION: No acute abnormalities.

## 2015-10-17 ENCOUNTER — Ambulatory Visit (INDEPENDENT_AMBULATORY_CARE_PROVIDER_SITE_OTHER): Payer: Medicare HMO | Admitting: *Deleted

## 2015-10-17 DIAGNOSIS — I441 Atrioventricular block, second degree: Secondary | ICD-10-CM | POA: Diagnosis not present

## 2015-10-18 NOTE — Progress Notes (Signed)
Remote pacemaker transmission.   

## 2015-10-20 LAB — CUP PACEART REMOTE DEVICE CHECK
Battery Remaining Longevity: 106 mo
Battery Voltage: 2.93 V
Brady Statistic RA Percent Paced: 1.7 %
Brady Statistic RV Percent Paced: 1 %
Date Time Interrogation Session: 20170123163416
Implantable Lead Implant Date: 20120405
Implantable Lead Implant Date: 20120405
Implantable Lead Location: 753860
Implantable Lead Model: 1948
Lead Channel Impedance Value: 350 Ohm
Lead Channel Pacing Threshold Amplitude: 0.625 V
Lead Channel Pacing Threshold Amplitude: 2 V
Lead Channel Pacing Threshold Pulse Width: 0.4 ms
Lead Channel Setting Pacing Amplitude: 2.25 V
Lead Channel Setting Pacing Pulse Width: 0.4 ms
Lead Channel Setting Sensing Sensitivity: 4 mV
MDC IDC LEAD LOCATION: 753859
MDC IDC MSMT BATTERY REMAINING PERCENTAGE: 81 %
MDC IDC MSMT LEADCHNL RV IMPEDANCE VALUE: 460 Ohm
MDC IDC MSMT LEADCHNL RV PACING THRESHOLD PULSEWIDTH: 0.4 ms
MDC IDC PG SERIAL: 7228914
MDC IDC SET LEADCHNL RA PACING AMPLITUDE: 1.625
MDC IDC STAT BRADY AP VP PERCENT: 1 %
MDC IDC STAT BRADY AP VS PERCENT: 1.7 %
MDC IDC STAT BRADY AS VP PERCENT: 1 %
MDC IDC STAT BRADY AS VS PERCENT: 98 %

## 2015-10-26 ENCOUNTER — Encounter: Payer: Self-pay | Admitting: Cardiology

## 2015-11-10 ENCOUNTER — Ambulatory Visit (INDEPENDENT_AMBULATORY_CARE_PROVIDER_SITE_OTHER): Payer: Medicare HMO | Admitting: Pulmonary Disease

## 2015-11-10 ENCOUNTER — Encounter: Payer: Self-pay | Admitting: Pulmonary Disease

## 2015-11-10 VITALS — BP 104/68 | HR 94 | Ht 67.0 in | Wt 193.0 lb

## 2015-11-10 DIAGNOSIS — J432 Centrilobular emphysema: Secondary | ICD-10-CM | POA: Diagnosis not present

## 2015-11-10 MED ORDER — FLUTICASONE FUROATE-VILANTEROL 100-25 MCG/INH IN AEPB: 1.0000 | INHALATION_SPRAY | Freq: Every day | RESPIRATORY_TRACT | Status: DC

## 2015-11-10 NOTE — Patient Instructions (Signed)
Breo one puff daily >> rinse mouth after each use  Follow up in 1 year

## 2015-11-10 NOTE — Progress Notes (Signed)
Current Outpatient Prescriptions on File Prior to Visit  Medication Sig  . albuterol (PROVENTIL HFA;VENTOLIN HFA) 108 (90 BASE) MCG/ACT inhaler Inhale 2 puffs into the lungs every 4 (four) hours as needed. For shortness of breath  . aspirin 325 MG tablet Take 162.5 mg by mouth daily.   Marland Kitchen donepezil (ARICEPT) 10 MG tablet TAKE 1 TABLET (10 MG TOTAL) BY MOUTH AT BEDTIME.  . folic acid (FOLVITE) 1 MG tablet Take 1 mg by mouth daily.  . hydrochlorothiazide (HYDRODIURIL) 25 MG tablet Take 12.5 mg by mouth every morning.   Marland Kitchen losartan (COZAAR) 50 MG tablet Take 50 mg by mouth every morning.  Marland Kitchen MAGNESIUM SULFATE PO Take 1 tablet by mouth daily.  . metoprolol (LOPRESSOR) 50 MG tablet Take 1 tablet (50 mg total) by mouth 2 (two) times daily.  . Multiple Vitamins-Minerals (CENTRUM SILVER PO) Take 1 tablet by mouth every morning.   . Omega-3 Fatty Acids (FISH OIL PO) Take 2 capsules by mouth daily.  Marland Kitchen omeprazole (PRILOSEC OTC) 20 MG tablet Take 20 mg by mouth 2 (two) times daily.  Marland Kitchen omeprazole (PRILOSEC) 20 MG capsule Take 20 mg by mouth 2 (two) times daily.  . polyethylene glycol (MIRALAX / GLYCOLAX) packet Take 17 g by mouth daily.  . simvastatin (ZOCOR) 20 MG tablet Take 20 mg by mouth every evening.   . tiotropium (SPIRIVA) 18 MCG inhalation capsule Place 18 mcg into inhaler and inhale daily.   No current facility-administered medications on file prior to visit.     Chief Complaint  Patient presents with  . Follow-up    No new complaints. Symbicort is no longer covered by insurance: alternatives are Advair and Breo.     Tests CT chest 08/26/09 >> mild centrilobular emphysema, 3 mm RUL and 4 mm RLL nodules CT chest 12/17/09 >> no change CT chest 05/03/10 >> RLL nodule 5.4 mm Echo 12/20/11 >> EF 55 to 60%, mild AS, grade 1 diastolic dysfx Spirometry 11/24/12 >> FEV1 0.89, FEV1% 57  Past medical hx CAD, HTN, Mobitz II s/p PPM, HLD, TIA, GERD, PUD, DM, Alzheimer's dementia  Past surgical hx,  Allergies, Family hx, Social hx all reviewed.  Vital Signs BP 104/68 mmHg  Pulse 94  Ht 5\' 7"  (1.702 m)  Wt 193 lb (87.544 kg)  BMI 30.22 kg/m2  SpO2 93%  History of Present Illness [name] is a 82 y.o. male former smoker with severe COPD/emphysema.  His breathing has been okay.  He is not having cough, wheeze, or sputum.  He denies chest pain.  He is not very active.  He uses spiriva daily.  He was told symbicort is no longer covered by his insurance.  He has not needed albuterol much.  Physical Exam  General - No distress ENT - No sinus tenderness, no oral exudate, no LAN Cardiac - s1s2 regular, no murmur Chest - No wheeze/rales/dullness Back - No focal tenderness Abd - Soft, non-tender Ext - No edema Neuro - Normal strength Skin - No rashes Psych - normal mood, and behavior   Assessment/Plan  COPD with emphysema. Plan: - will change to Breo in place of symbicort due to insurance coverage - continue spiriva and prn albuterol    Patient Instructions  Breo one puff daily >> rinse mouth after each use  Follow up in 1 year     Chesley Mires, MD [place] Pager:  [phone]

## 2015-12-21 ENCOUNTER — Other Ambulatory Visit: Payer: Self-pay | Admitting: Pulmonary Disease

## 2015-12-21 MED ORDER — FLUTICASONE FUROATE-VILANTEROL 100-25 MCG/INH IN AEPB: 1.0000 | INHALATION_SPRAY | Freq: Every day | RESPIRATORY_TRACT | Status: DC

## 2016-01-25 ENCOUNTER — Encounter: Payer: Self-pay | Admitting: Nurse Practitioner

## 2016-01-25 NOTE — Progress Notes (Signed)
Electrophysiology Office Note Date: 01/26/2016  ID:  [name], DOB October 07, 1926, MRN [id number]  PCP: Stephens Shire, MD Primary Cardiologist: [place] Electrophysiologist: Allred  CC: Pacemaker follow-up  [name] is a 82 y.o. male seen today for Dr [name].  He presents today for routine electrophysiology followup.  Since last being seen in our clinic, the patient reports doing reasonably well.  He is seen with his wife today and is extremely hard of hearing. She provides ROS data.  He denies chest pain, palpitations, dyspnea, PND, orthopnea, nausea, vomiting, dizziness, syncope, edema, weight gain, or early satiety.  Device History: STJ dual chamber PPM implanted 2012 for Mobitz II    Past Medical History  Diagnosis Date  . Coronary artery disease   . Diabetes mellitus   . Hypertension   . Hyperlipidemia   . Obesities, morbid ([place])   . Gallstone pancreatitis     recurrent  . TIA (transient ischemic attack)   . Bacteremia due to vancomycin resistant Enterococcus   . GERD (gastroesophageal reflux disease)   . Hearing impairment   . Myocardial infarction, anterior wall, subsequent care   . Second degree AV block, Mobitz type II     a. s/p STJ dual chamber PPM  . Duodenal ulcer   . GI bleed   . COPD (chronic obstructive pulmonary disease) with chronic bronchitis ([place])   . Dementia in Alzheimer's disease with delirium   . Cancer [facility])     precancer skin lesions   Past Surgical History  Procedure Laterality Date  . Cholecystectomy    . Pacemaker placement  01/01/11    STJ dual chamber PPM implanted by Dr [name] for Mobitz II   . Pancreatic stent    . Ercp N/A 05/30/2014    Procedure: ENDOSCOPIC RETROGRADE CHOLANGIOPANCREATOGRAPHY (ERCP);  Surgeon: Ladene Artist, MD;  Location: [facility] ENDOSCOPY;  Service: Endoscopy;  Laterality: N/A;  . Ercp N/A 07/09/2014    Procedure: ENDOSCOPIC RETROGRADE CHOLANGIOPANCREATOGRAPHY (ERCP);  Surgeon: Beryle Beams, MD;  Location:  [facility] ENDOSCOPY;  Service: Endoscopy;  Laterality: N/A;  . Biliary stent placement N/A 07/09/2014    Procedure: BILIARY STENT PLACEMENT;  Surgeon: Beryle Beams, MD;  Location: [facility] ENDOSCOPY;  Service: Endoscopy;  Laterality: N/A;    Current Outpatient Prescriptions  Medication Sig Dispense Refill  . albuterol (PROVENTIL HFA;VENTOLIN HFA) 108 (90 BASE) MCG/ACT inhaler Inhale 2 puffs into the lungs every 4 (four) hours as needed. For shortness of breath    . aspirin 325 MG tablet Take 162.5 mg by mouth daily.     . budesonide-formoterol (SYMBICORT) 160-4.5 MCG/ACT inhaler Inhale 2 puffs into the lungs 2 (two) times daily.    . fluticasone furoate-vilanterol (BREO ELLIPTA) 100-25 MCG/INH AEPB Inhale 1 puff into the lungs daily. 1 each 11  . folic acid (FOLVITE) 1 MG tablet Take 1 mg by mouth daily.    . hydrochlorothiazide (HYDRODIURIL) 25 MG tablet Take 12.5 mg by mouth every morning.     Marland Kitchen losartan (COZAAR) 50 MG tablet Take 50 mg by mouth every morning.    Marland Kitchen MAGNESIUM SULFATE PO Take 1 tablet by mouth daily.    . memantine (NAMENDA XR) 28 MG CP24 24 hr capsule Take 28 mg by mouth daily.    . metoprolol (LOPRESSOR) 50 MG tablet Take 1 tablet (50 mg total) by mouth 2 (two) times daily. 180 tablet 3  . Multiple Vitamins-Minerals (CENTRUM SILVER PO) Take 1 tablet by mouth every morning.     Marland Kitchen  Omega-3 Fatty Acids (FISH OIL PO) Take 2 capsules by mouth daily.    Marland Kitchen omeprazole (PRILOSEC) 20 MG capsule Take 20 mg by mouth 2 (two) times daily.  4  . polyethylene glycol (MIRALAX / GLYCOLAX) packet Take 17 g by mouth daily.    . simvastatin (ZOCOR) 20 MG tablet Take 20 mg by mouth every evening.     . tiotropium (SPIRIVA) 18 MCG inhalation capsule Place 18 mcg into inhaler and inhale daily.     No current facility-administered medications for this visit.    Allergies:   Doxycycline and Penicillins   Social History: Social History   Social History  . Marital Status: Married    Spouse Name: [name]    . Number of Children: 1  . Years of Education: 6   Occupational History  . 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 Smoking status: Former Smoker -- 1.00 packs/day for 57 years    Types: Cigarettes    Quit date: 01/12/1996  . Smokeless tobacco: Never Used  . Alcohol Use: No  . Drug Use: No  . Sexual Activity: Not on file   Other Topics Concern  . Not on file   Social History Narrative   Patient is married [name]) and lives with his wife.   Patient has one child.   Patient is retired.   Patient has a 6th grade education.   Patient is right-handed.   Patient drinks caffeine daily.    Family History: Family History  Problem Relation Age of Onset  . Hypertension Other   . Sudden Cardiac Death Father   . Cerebral aneurysm Mother      Review of Systems: All other systems reviewed and are otherwise negative except as noted above.   Physical Exam: VS:  BP 115/68 mmHg  Pulse 67  Ht 5\' 7"  (1.702 m)  Wt 192 lb (87.091 kg)  BMI 30.06 kg/m2  SpO2 97% , BMI Body mass index is 30.06 kg/(m^2).  GEN- The patient is elderly appearing, extremely hard of hearing   HEENT: normocephalic, atraumatic; sclera clear, conjunctiva pink; hearing intact; oropharynx clear; neck supple,  Lungs- Clear to ausculation bilaterally, normal work of breathing.  No wheezes, rales, rhonchi Heart- Regular rate and rhythm  GI- soft, non-tender, non-distended, bowel sounds present  Extremities- no clubbing, cyanosis, or edema  MS- no significant deformity or atrophy Skin- warm and dry, no rash or lesion; PPM pocket well healed Psych- euthymic mood, flat affect Neuro- strength and sensation are intact  PPM Interrogation- reviewed in detail today,  See PACEART report  EKG:  EKG is not ordered today.  Recent Labs: 06/14/2015: ALT 13*; BUN 9; Creatinine, Ser 1.11; Hemoglobin 13.6; Platelets 258; Potassium 3.7; Sodium 138   Wt Readings from Last 3 Encounters:  01/26/16  192 lb (87.091 kg)  11/10/15 193 lb (87.544 kg)  09/28/15 190 lb 6.4 oz (86.365 kg)     Other studies Reviewed: Additional studies/ records that were reviewed today include: Dr [name] and Dr Doug Sou office notes  Assessment and Plan:  1.  Mobitz II Normal PPM function See Pace Art report No changes today Pt with chronically elevated RV threshold. RV pacing <1% of the time today. RV noise stable with sensitivity adjustments at last office visit with Dr [name]. Would prefer to avoid procedures if possible given advanced age, will follow for now  2.  HTN Stable No change required today  3.  CAD  No recent ischemic symptoms Continue medical therapy  Current medicines are reviewed at length with the patient today.   The patient does not have concerns regarding his medicines.  The following changes were made today:  none  Labs/ tests ordered today include: none    Disposition:   Follow up with Dr Martinique as scheduled, Merlin, me in 1 year     Signed, Chanetta Marshall, NP 01/26/2016 11:03 AM  [place] [address] [place] [address] [phone] (office) [phone] (fax)

## 2016-01-26 ENCOUNTER — Encounter: Payer: Self-pay | Admitting: Nurse Practitioner

## 2016-01-26 ENCOUNTER — Ambulatory Visit (INDEPENDENT_AMBULATORY_CARE_PROVIDER_SITE_OTHER): Payer: Medicare HMO | Admitting: Nurse Practitioner

## 2016-01-26 ENCOUNTER — Encounter: Payer: Self-pay | Admitting: Internal Medicine

## 2016-01-26 VITALS — BP 115/68 | HR 67 | Ht 67.0 in | Wt 192.0 lb

## 2016-01-26 DIAGNOSIS — I441 Atrioventricular block, second degree: Secondary | ICD-10-CM | POA: Diagnosis not present

## 2016-01-26 DIAGNOSIS — I10 Essential (primary) hypertension: Secondary | ICD-10-CM

## 2016-01-26 LAB — CUP PACEART INCLINIC DEVICE CHECK
Battery Remaining Longevity: 116.4
Battery Voltage: 2.93 V
Brady Statistic RA Percent Paced: 1.3 %
Implantable Lead Implant Date: 20120405
Implantable Lead Implant Date: 20120405
Implantable Lead Location: 753859
Lead Channel Impedance Value: 512.5 Ohm
Lead Channel Pacing Threshold Amplitude: 0.625 V
Lead Channel Pacing Threshold Amplitude: 1.5 V
Lead Channel Pacing Threshold Pulse Width: 0.4 ms
Lead Channel Pacing Threshold Pulse Width: 0.4 ms
Lead Channel Sensing Intrinsic Amplitude: 12 mV
Lead Channel Setting Pacing Amplitude: 1.625
Lead Channel Setting Pacing Amplitude: 1.75 V
Lead Channel Setting Sensing Sensitivity: 4 mV
MDC IDC LEAD LOCATION: 753860
MDC IDC LEAD MODEL: 1948
MDC IDC MSMT LEADCHNL RA IMPEDANCE VALUE: 375 Ohm
MDC IDC MSMT LEADCHNL RA SENSING INTR AMPL: 3.2 mV
MDC IDC PG SERIAL: 7228914
MDC IDC SESS DTM: 20170504135629
MDC IDC SET LEADCHNL RV PACING PULSEWIDTH: 0.4 ms
MDC IDC STAT BRADY RV PERCENT PACED: 0.26 %
Pulse Gen Model: 2210

## 2016-01-26 NOTE — Patient Instructions (Signed)
Medication Instructions:   Your physician recommends that you continue on your current medications as directed. Please refer to the Current Medication list given to you today.    If you need a refill on your cardiac medications before your next appointment, please call your pharmacy.  Labwork:  NONE ORDER TODAY    Testing/Procedures: .NONE ORDER TODAY    Follow-Up: Your physician wants you to follow-up in: [place] will receive a reminder letter in the mail two months in advance. If you don't receive a letter, please call our office to schedule the follow-up appointment.   Remote monitoring is used to monitor your Pacemaker of ICD from home. This monitoring reduces the number of office visits required to check your device to one time per year. It allows [place] to keep an eye on the functioning of your device to ensure it is working properly. You are scheduled for a device check from home on . 04/28/2016..You may send your transmission at any time that day. If you have a wireless device, the transmission will be sent automatically. After your physician reviews your transmission, you will receive a postcard with your next transmission date.   Any Other Special Instructions Will Be Listed Below (If Applicable).

## 2016-01-27 ENCOUNTER — Telehealth: Payer: Self-pay | Admitting: *Deleted

## 2016-01-27 NOTE — Telephone Encounter (Signed)
Initiated PA for Symbicort thru CMM. Key: [place] for review.  Pt ID: [name] (p) [phone]  (f) [phone]

## 2016-01-27 NOTE — Telephone Encounter (Signed)
Symbicort has been approved until 09/23/2016. Pharmacy informed. Nothing further needed.

## 2016-03-24 DIAGNOSIS — A419 Sepsis, unspecified organism: Secondary | ICD-10-CM

## 2016-03-24 HISTORY — DX: Sepsis, unspecified organism: A41.9

## 2016-03-26 ENCOUNTER — Emergency Department (HOSPITAL_BASED_OUTPATIENT_CLINIC_OR_DEPARTMENT_OTHER): Payer: Medicare HMO

## 2016-03-26 ENCOUNTER — Inpatient Hospital Stay (HOSPITAL_BASED_OUTPATIENT_CLINIC_OR_DEPARTMENT_OTHER)
Admission: EM | Admit: 2016-03-26 | Discharge: 2016-03-30 | DRG: 871 | Disposition: A | Discharge status: Home / Self Care | Payer: Medicare HMO | Attending: Internal Medicine | Admitting: Internal Medicine

## 2016-03-26 ENCOUNTER — Encounter (HOSPITAL_BASED_OUTPATIENT_CLINIC_OR_DEPARTMENT_OTHER): Payer: Self-pay | Admitting: Emergency Medicine

## 2016-03-26 DIAGNOSIS — Z8249 Family history of ischemic heart disease and other diseases of the circulatory system: Secondary | ICD-10-CM | POA: Diagnosis not present

## 2016-03-26 DIAGNOSIS — Z66 Do not resuscitate: Secondary | ICD-10-CM | POA: Diagnosis present

## 2016-03-26 DIAGNOSIS — G9340 Encephalopathy, unspecified: Secondary | ICD-10-CM | POA: Diagnosis present

## 2016-03-26 DIAGNOSIS — K219 Gastro-esophageal reflux disease without esophagitis: Secondary | ICD-10-CM | POA: Diagnosis present

## 2016-03-26 DIAGNOSIS — R740 Nonspecific elevation of levels of transaminase and lactic acid dehydrogenase [LDH]: Secondary | ICD-10-CM | POA: Diagnosis present

## 2016-03-26 DIAGNOSIS — J449 Chronic obstructive pulmonary disease, unspecified: Secondary | ICD-10-CM | POA: Diagnosis present

## 2016-03-26 DIAGNOSIS — J439 Emphysema, unspecified: Secondary | ICD-10-CM | POA: Diagnosis not present

## 2016-03-26 DIAGNOSIS — I5022 Chronic systolic (congestive) heart failure: Secondary | ICD-10-CM | POA: Diagnosis present

## 2016-03-26 DIAGNOSIS — F0281 Dementia in other diseases classified elsewhere with behavioral disturbance: Secondary | ICD-10-CM | POA: Diagnosis present

## 2016-03-26 DIAGNOSIS — E1122 Type 2 diabetes mellitus with diabetic chronic kidney disease: Secondary | ICD-10-CM | POA: Diagnosis present

## 2016-03-26 DIAGNOSIS — N179 Acute kidney failure, unspecified: Secondary | ICD-10-CM | POA: Diagnosis present

## 2016-03-26 DIAGNOSIS — Z7951 Long term (current) use of inhaled steroids: Secondary | ICD-10-CM | POA: Diagnosis not present

## 2016-03-26 DIAGNOSIS — A419 Sepsis, unspecified organism: Principal | ICD-10-CM | POA: Diagnosis present

## 2016-03-26 DIAGNOSIS — Z8673 Personal history of transient ischemic attack (TIA), and cerebral infarction without residual deficits: Secondary | ICD-10-CM

## 2016-03-26 DIAGNOSIS — N182 Chronic kidney disease, stage 2 (mild): Secondary | ICD-10-CM | POA: Diagnosis present

## 2016-03-26 DIAGNOSIS — Z9049 Acquired absence of other specified parts of digestive tract: Secondary | ICD-10-CM | POA: Diagnosis not present

## 2016-03-26 DIAGNOSIS — Z87891 Personal history of nicotine dependence: Secondary | ICD-10-CM | POA: Diagnosis not present

## 2016-03-26 DIAGNOSIS — I252 Old myocardial infarction: Secondary | ICD-10-CM

## 2016-03-26 DIAGNOSIS — E860 Dehydration: Secondary | ICD-10-CM | POA: Diagnosis present

## 2016-03-26 DIAGNOSIS — I10 Essential (primary) hypertension: Secondary | ICD-10-CM | POA: Diagnosis present

## 2016-03-26 DIAGNOSIS — I2510 Atherosclerotic heart disease of native coronary artery without angina pectoris: Secondary | ICD-10-CM | POA: Diagnosis present

## 2016-03-26 DIAGNOSIS — I130 Hypertensive heart and chronic kidney disease with heart failure and stage 1 through stage 4 chronic kidney disease, or unspecified chronic kidney disease: Secondary | ICD-10-CM | POA: Diagnosis present

## 2016-03-26 DIAGNOSIS — Z7982 Long term (current) use of aspirin: Secondary | ICD-10-CM | POA: Diagnosis not present

## 2016-03-26 DIAGNOSIS — R6510 Systemic inflammatory response syndrome (SIRS) of non-infectious origin without acute organ dysfunction: Secondary | ICD-10-CM | POA: Diagnosis present

## 2016-03-26 DIAGNOSIS — E119 Type 2 diabetes mellitus without complications: Secondary | ICD-10-CM

## 2016-03-26 DIAGNOSIS — Z950 Presence of cardiac pacemaker: Secondary | ICD-10-CM

## 2016-03-26 DIAGNOSIS — H9190 Unspecified hearing loss, unspecified ear: Secondary | ICD-10-CM | POA: Diagnosis present

## 2016-03-26 DIAGNOSIS — G309 Alzheimer's disease, unspecified: Secondary | ICD-10-CM | POA: Diagnosis present

## 2016-03-26 DIAGNOSIS — K838 Other specified diseases of biliary tract: Secondary | ICD-10-CM

## 2016-03-26 DIAGNOSIS — I441 Atrioventricular block, second degree: Secondary | ICD-10-CM | POA: Diagnosis present

## 2016-03-26 DIAGNOSIS — J432 Centrilobular emphysema: Secondary | ICD-10-CM | POA: Diagnosis not present

## 2016-03-26 DIAGNOSIS — Z880 Allergy status to penicillin: Secondary | ICD-10-CM

## 2016-03-26 DIAGNOSIS — F03918 Unspecified dementia, unspecified severity, with other behavioral disturbance: Secondary | ICD-10-CM | POA: Diagnosis present

## 2016-03-26 DIAGNOSIS — E785 Hyperlipidemia, unspecified: Secondary | ICD-10-CM | POA: Diagnosis present

## 2016-03-26 DIAGNOSIS — Z79899 Other long term (current) drug therapy: Secondary | ICD-10-CM | POA: Diagnosis not present

## 2016-03-26 DIAGNOSIS — Z881 Allergy status to other antibiotic agents status: Secondary | ICD-10-CM

## 2016-03-26 DIAGNOSIS — N189 Chronic kidney disease, unspecified: Secondary | ICD-10-CM | POA: Diagnosis not present

## 2016-03-26 DIAGNOSIS — N2889 Other specified disorders of kidney and ureter: Secondary | ICD-10-CM | POA: Diagnosis present

## 2016-03-26 DIAGNOSIS — F0391 Unspecified dementia with behavioral disturbance: Secondary | ICD-10-CM | POA: Diagnosis present

## 2016-03-26 HISTORY — DX: Chronic kidney disease, unspecified: N18.9

## 2016-03-26 HISTORY — DX: Sepsis, unspecified organism: A41.9

## 2016-03-26 LAB — COMPREHENSIVE METABOLIC PANEL
ALT: 77 U/L — ABNORMAL HIGH (ref 17–63)
ANION GAP: 13 (ref 5–15)
AST: 136 U/L — ABNORMAL HIGH (ref 15–41)
Albumin: 3.3 g/dL — ABNORMAL LOW (ref 3.5–5.0)
Alkaline Phosphatase: 152 U/L — ABNORMAL HIGH (ref 38–126)
BUN: 17 mg/dL (ref 6–20)
CALCIUM: 9 mg/dL (ref 8.9–10.3)
CHLORIDE: 99 mmol/L — AB (ref 101–111)
CO2: 24 mmol/L (ref 22–32)
Creatinine, Ser: 1.42 mg/dL — ABNORMAL HIGH (ref 0.61–1.24)
GFR calc non Af Amer: 43 mL/min — ABNORMAL LOW (ref >60)
GFR, EST AFRICAN AMERICAN: 50 mL/min — AB (ref >60)
Glucose, Bld: 248 mg/dL — ABNORMAL HIGH (ref 65–99)
Potassium: 4 mmol/L (ref 3.5–5.1)
SODIUM: 136 mmol/L (ref 135–145)
Total Bilirubin: 1.7 mg/dL — ABNORMAL HIGH (ref 0.3–1.2)
Total Protein: 6.9 g/dL (ref 6.5–8.1)

## 2016-03-26 LAB — LIPASE, BLOOD: LIPASE: 14 U/L (ref 11–51)

## 2016-03-26 LAB — CBC WITH DIFFERENTIAL/PLATELET
BASOS ABS: 0 10*3/uL (ref 0.0–0.1)
Basophils Relative: 0 %
EOS ABS: 0 10*3/uL (ref 0.0–0.7)
Eosinophils Relative: 0 %
HCT: 42.6 % (ref 39.0–52.0)
HEMOGLOBIN: 14.5 g/dL (ref 13.0–17.0)
LYMPHS ABS: 1.8 10*3/uL (ref 0.7–4.0)
LYMPHS PCT: 14 %
MCH: 32.5 pg (ref 26.0–34.0)
MCHC: 34 g/dL (ref 30.0–36.0)
MCV: 95.5 fL (ref 78.0–100.0)
Monocytes Absolute: 0.7 10*3/uL (ref 0.1–1.0)
Monocytes Relative: 5 %
NEUTROS PCT: 81 %
Neutro Abs: 10.3 10*3/uL — ABNORMAL HIGH (ref 1.7–7.7)
Platelets: 305 10*3/uL (ref 150–400)
RBC: 4.46 MIL/uL (ref 4.22–5.81)
RDW: 13.2 % (ref 11.5–15.5)
WBC: 12.7 10*3/uL — AB (ref 4.0–10.5)

## 2016-03-26 LAB — URINALYSIS, ROUTINE W REFLEX MICROSCOPIC
GLUCOSE, UA: NEGATIVE mg/dL
HGB URINE DIPSTICK: NEGATIVE
Ketones, ur: NEGATIVE mg/dL
Leukocytes, UA: NEGATIVE
Nitrite: NEGATIVE
PH: 7 (ref 5.0–8.0)
Protein, ur: NEGATIVE mg/dL
SPECIFIC GRAVITY, URINE: 1.02 (ref 1.005–1.030)

## 2016-03-26 LAB — I-STAT CG4 LACTIC ACID, ED
LACTIC ACID, VENOUS: 1.6 mmol/L (ref 0.5–1.9)
Lactic Acid, Venous: 3.35 mmol/L (ref 0.5–1.9)

## 2016-03-26 LAB — TROPONIN I: TROPONIN I: 0.03 ng/mL — AB (ref <0.03)

## 2016-03-26 MED ORDER — ACETAMINOPHEN 650 MG RE SUPP
650.0000 mg | Freq: Once | RECTAL | Status: AC
  Administered 2016-03-26: 650 mg via RECTAL
  Filled 2016-03-26: qty 1

## 2016-03-26 MED ORDER — SODIUM CHLORIDE 0.9 % IV BOLUS (SEPSIS)
1000.0000 mL | Freq: Once | INTRAVENOUS | Status: AC
Start: 2016-03-26 — End: 2016-03-26
  Administered 2016-03-26: 1000 mL via INTRAVENOUS

## 2016-03-26 MED ORDER — IOPAMIDOL (ISOVUE-300) INJECTION 61%
75.0000 mL | Freq: Once | INTRAVENOUS | Status: AC | PRN
  Administered 2016-03-26: 75 mL via INTRAVENOUS

## 2016-03-26 MED ORDER — DEXTROSE 5 % IV SOLN
1.0000 g | Freq: Once | INTRAVENOUS | Status: AC
  Administered 2016-03-26: 1 g via INTRAVENOUS
  Filled 2016-03-26: qty 10

## 2016-03-26 MED ORDER — SODIUM CHLORIDE 0.9 % IV SOLN
INTRAVENOUS | Status: AC
  Administered 2016-03-26: 22:00:00 via INTRAVENOUS

## 2016-03-26 MED ORDER — HALOPERIDOL LACTATE 5 MG/ML IJ SOLN
1.0000 mg | Freq: Four times a day (QID) | INTRAMUSCULAR | Status: AC | PRN
  Administered 2016-03-26 – 2016-03-28 (×2): 1 mg via INTRAVENOUS
  Filled 2016-03-26 (×2): qty 1

## 2016-03-26 MED ORDER — SODIUM CHLORIDE 0.9 % IV BOLUS (SEPSIS)
1000.0000 mL | Freq: Once | INTRAVENOUS | Status: AC
  Administered 2016-03-26: 1000 mL via INTRAVENOUS

## 2016-03-26 MED ORDER — METRONIDAZOLE IN NACL 5-0.79 MG/ML-% IV SOLN
500.0000 mg | Freq: Once | INTRAVENOUS | Status: AC
  Administered 2016-03-26: 500 mg via INTRAVENOUS
  Filled 2016-03-26: qty 100

## 2016-03-26 MED ORDER — SODIUM CHLORIDE 0.9 % IV BOLUS (SEPSIS)
500.0000 mL | Freq: Once | INTRAVENOUS | Status: AC
  Administered 2016-03-26: 500 mL via INTRAVENOUS

## 2016-03-26 NOTE — ED Notes (Signed)
Pt with large semiformed stool - perineal area cleaned with 3 staff assist.

## 2016-03-26 NOTE — ED Notes (Signed)
Pt currently responding with nods more appropriately, following some commands.

## 2016-03-26 NOTE — ED Notes (Signed)
Patient transported to X-ray via stretcher per tech. 

## 2016-03-26 NOTE — ED Provider Notes (Signed)
CSN: [id number]     Arrival date & time 03/26/16  1422 History  By signing my name below, I, [name], attest that this documentation has been prepared under the direction and in the presence of Todd Dessert, MD . Electronically Signed: Dyke Rich, Scribe. 03/26/2016. 3:42 PM.   Chief Complaint  Patient presents with  . Fatigue   The history is provided by the spouse. No language interpreter was used.    HPI Comments:  [name] is a 82 y.o. male with PMHx of dementia, COPD, HTN, and CAD who presents to the Emergency Department with wife complaining of sudden onset fatigue. She states he has been "acting differently" since yesterday. She reports difficulty ambulating. Per wife, pt is experiencing associated appetite change, foul smelling urine, and dark urine. She states it is very abnormal for him to not eat.  Spouse reports she noticed a change in his urine a few days ago. Spouse states pt fell a few days ago, but has not fallen since. Wife states he was off of metoprolol, but has resumed two days ago and has no other changes in medication. Wife denies any incontinence or cough.   Past Medical History  Diagnosis Date  . Coronary artery disease   . Diabetes mellitus   . Hypertension   . Hyperlipidemia   . Obesities, morbid ([place])   . Gallstone pancreatitis     recurrent  . TIA (transient ischemic attack)   . Bacteremia due to vancomycin resistant Enterococcus   . GERD (gastroesophageal reflux disease)   . Hearing impairment   . Myocardial infarction, anterior wall, subsequent care   . Second degree AV block, Mobitz type II     a. s/p STJ dual chamber PPM  . Duodenal ulcer   . GI bleed   . 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 Dementia in Alzheimer's disease with delirium   . Cancer [facility])     precancer skin lesions   Past Surgical History  Procedure Laterality Date  . Cholecystectomy    . Pacemaker placement  01/01/11     STJ dual chamber PPM implanted by Dr Rayann Heman for Mobitz II   . Pancreatic stent    . Ercp N/A 05/30/2014    Procedure: ENDOSCOPIC RETROGRADE CHOLANGIOPANCREATOGRAPHY (ERCP);  Surgeon: Ladene Artist, MD;  Location: [facility] ENDOSCOPY;  Service: Endoscopy;  Laterality: N/A;  . Ercp N/A 07/09/2014    Procedure: ENDOSCOPIC RETROGRADE CHOLANGIOPANCREATOGRAPHY (ERCP);  Surgeon: Beryle Beams, MD;  Location: [facility] ENDOS[facility];  Service: Endoscopy;  Laterality: N/A;  . Biliary stent placement N/A 07/09/2014    Procedure: BILIARY STENT PLACEMENT;  Surgeon: Beryle Beams, MD;  Location: [facility] ENDOSCOPY;  Service: Endoscopy;  Laterality: N/A;   Family History  Problem Relation Age of Onset  . Hypertension Other   . Sudden Cardiac Death Father   . Cerebral aneurysm Mother    Social History  Substance Use Topics  . Smoking status: Former Smoker -- 1.00 packs/day for 57 years    Types: Cigarettes    Quit date: 01/12/1996  . Smokeless tobacco: Never Used  . Alcohol Use: No    Review of Systems  Constitutional: Positive for fever, activity change, appetite change and fatigue.  Respiratory: Negative for cough.   Genitourinary:       -incontinence    Allergies  Doxycycline and Penicillins  Home Medications   Prior to Admission medications   Medication Sig Start Date End Date Taking? Authorizing  Provider  albuterol (PROVENTIL HFA;VENTOLIN HFA) 108 (90 BASE) MCG/ACT inhaler Inhale 2 puffs into the lungs every 4 (four) hours as needed. For shortness of breath    Historical Provider, MD  aspirin 325 MG tablet Take 162.5 mg by mouth daily.     Historical Provider, MD  budesonide-formoterol (SYMBICORT) 160-4.5 MCG/ACT inhaler Inhale 2 puffs into the lungs 2 (two) times daily.    Historical Provider, MD  fluticasone furoate-vilanterol (BREO ELLIPTA) 100-25 MCG/INH AEPB Inhale 1 puff into the lungs daily. 12/21/15   Chesley Mires, MD  folic acid (FOLVITE) 1 MG tablet Take 1 mg by mouth daily.    Historical Provider,  MD  hydrochlorothiazide (HYDRODIURIL) 25 MG tablet Take 12.5 mg by mouth every morning.     Historical Provider, MD  losartan (COZAAR) 50 MG tablet Take 50 mg by mouth every morning.    Historical Provider, MD  MAGNESIUM SULFATE PO Take 1 tablet by mouth daily.    Historical Provider, MD  memantine (NAMENDA XR) 28 MG CP24 24 hr capsule Take 28 mg by mouth daily.    Historical Provider, MD  metoprolol (LOPRESSOR) 50 MG tablet Take 1 tablet (50 mg total) by mouth 2 (two) times daily. 10/14/15   Todd M Martinique, MD  Multiple Vitamins-Minerals (CENTRUM SILVER PO) Take 1 tablet by mouth every morning.     Historical Provider, MD  Omega-3 Fatty Acids (FISH OIL PO) Take 2 capsules by mouth daily.    Historical Provider, MD  omeprazole (PRILOSEC) 20 MG capsule Take 20 mg by mouth 2 (two) times daily. 03/01/15   Historical Provider, MD  polyethylene glycol (MIRALAX / GLYCOLAX) packet Take 17 g by mouth daily.    Historical Provider, MD  simvastatin (ZOCOR) 20 MG tablet Take 20 mg by mouth every evening.     Historical Provider, MD  tiotropium (SPIRIVA) 18 MCG inhalation capsule Place 18 mcg into inhaler and inhale daily.    Historical Provider, MD   BP 90/76 mmHg  Pulse 107  Temp(Src) 100.9 F (38.3 C) (Rectal)  Resp 20  Ht 5\' 4"  (1.626 m)  Wt 180 lb (81.647 kg)  BMI 30.88 kg/m2  SpO2 95% Physical Exam  Constitutional: He appears well-developed and well-nourished. No distress.  HENT:  Head: Normocephalic and atraumatic.  Right Ear: Hearing normal.  Left Ear: Hearing normal.  Nose: Nose normal.  Mouth/Throat: Oropharynx is clear and moist and mucous membranes are normal.  Dry mucus membranes Bilateral conjuntial injection  Eyes: Conjunctivae and EOM are normal. Pupils are equal, round, and reactive to light.  Neck: Normal range of motion. Neck supple.  Cardiovascular: Regular rhythm, S1 normal and S2 normal.  Exam reveals no gallop and no friction rub.   No murmur heard. Tachycardic     Pulmonary/Chest: No respiratory distress. He exhibits no tenderness.  Decreased breath sounds globally related to effort  Abdominal: Soft. Normal appearance and bowel sounds are normal. There is no hepatosplenomegaly. There is no tenderness. There is no rebound, no guarding, no tenderness at McBurney's point and negative Murphy's sign. No hernia.  Pt winces when palpating entire abdomen  Musculoskeletal: Normal range of motion. He exhibits no edema.  Neurological: He is alert. He has normal strength. No cranial nerve deficit or sensory deficit. Coordination normal. GCS eye subscore is 4. GCS verbal subscore is 5. GCS motor subscore is 6.  Non verbal  Skin: Skin is warm, dry and intact. No rash noted. No cyanosis.  Psychiatric: He has a normal mood and  affect. His speech is normal and behavior is normal. Thought content normal.  Nursing note and vitals reviewed.   ED Course  Procedures  DIAGNOSTIC STUDIES:  Oxygen Saturation is 95% on RA, adequate by my interpretation.    COORDINATION OF CARE:  3:30 Will order lipase, CT head, and CT abdomen. Discussed treatment plan with pt at bedside and pt agreed to plan.   Labs Review Labs Reviewed  COMPREHENSIVE METABOLIC PANEL - Abnormal; Notable for the following:    Chloride 99 (*)    Glucose, Bld 248 (*)    Creatinine, Ser 1.42 (*)    Albumin 3.3 (*)    AST 136 (*)    ALT 77 (*)    Alkaline Phosphatase 152 (*)    Total Bilirubin 1.7 (*)    GFR calc non Af Amer 43 (*)    GFR calc Af Amer 50 (*)    All other components within normal limits  CBC WITH DIFFERENTIAL/PLATELET - Abnormal; Notable for the following:    WBC 12.7 (*)    Neutro Abs 10.3 (*)    All other components within normal limits  URINALYSIS, ROUTINE W REFLEX MICROSCOPIC (NOT AT [facility]) - Abnormal; Notable for the following:    Color, Urine AMBER (*)    Bilirubin Urine SMALL (*)    All other components within normal limits  TROPONIN I - Abnormal; Notable for the  following:    Troponin I 0.03 (*)    All other components within normal limits  I-STAT CG4 LACTIC ACID, ED - Abnormal; Notable for the following:    Lactic Acid, Venous 3.35 (*)    All other components within normal limits  CULTURE, BLOOD (ROUTINE X 2)  CULTURE, BLOOD (ROUTINE X 2)  URINE CULTURE  LIPASE, BLOOD  I-STAT CG4 LACTIC ACID, ED  I-STAT CG4 LACTIC ACID, ED  I-STAT CG4 LACTIC ACID, ED    Imaging Review Dg Chest 2 View  03/26/2016  CLINICAL DATA:  Dementia, shortness of breath, coronary disease, diabetes mellitus, hypertension EXAM: CHEST  2 VIEW COMPARISON:  06/14/2015 FINDINGS: LEFT subclavian transvenous pacemaker leads project at RIGHT atrium and RIGHT ventricle. Enlargement of cardiac silhouette with slight vascular congestion. Atherosclerotic calcification aorta. Motion artifacts degrade lateral view. Emphysematous changes without infiltrate, pleural effusion, or pneumothorax. Bones demineralized. IMPRESSION: Enlargement of cardiac silhouette with pulmonary vascular congestion post pacemaker. COPD changes without acute infiltrate. Aortic atherosclerosis. Electronically Signed   By: Lavonia Dana M.D.   On: 03/26/2016 15:15   Ct Head Wo Contrast  03/26/2016  CLINICAL DATA:  Altered mental status, difficulty with ambulation. EXAM: CT HEAD WITHOUT CONTRAST TECHNIQUE: Contiguous axial images were obtained from the base of the skull through the vertex without intravenous contrast. COMPARISON:  Head CT 06/14/2015 FINDINGS: Brain: Stable atrophy and chronic small vessel ischemia. No intracranial hemorrhage, mass effect, or midline shift. Chronic ischemia versus remote lacunar infarct in the pons, unchanged. No hydrocephalus. The basilar cisterns are patent. No evidence of territorial infarct. No intracranial fluid collection. Vascular: No hyperdense vessel or abnormal calcification. Skull:  Calvarium is intact. Sinuses/Orbits: Included paranasal sinuses and mastoid air cells are well aerated.  Other: None. IMPRESSION: Stable atrophy and chronic small vessel ischemia. No acute intracranial abnormality. Electronically Signed   By: Jeb Levering M.D.   On: 03/26/2016 18:13   Ct Abdomen Pelvis W Contrast  03/26/2016  CLINICAL DATA:  Dementia.  Decreased appetite. EXAM: CT ABDOMEN AND PELVIS WITH CONTRAST TECHNIQUE: Multidetector CT imaging of the abdomen and pelvis  was performed using the standard protocol following bolus administration of intravenous contrast. CONTRAST:  75 mL Isovue-300 IV. COMPARISON:  05/28/2014 and 04/19/2011 FINDINGS: Lower chest: Lung bases demonstrate minimal scarring. Cardiac pacer leads are present. There is a small amount of pericardial fluid unchanged. Sub cm right pericardial phrenic lymph node unchanged. Hepatobiliary: Previous cholecystectomy. Biliary stent in place new since the prior exam. Prominence of the common bile duct measuring 1.7 cm slightly worse. Minimal air within the common duct and left intrahepatic biliary tree unchanged. No focal liver mass. Pancreas: No mass, inflammatory changes, or other significant abnormality. Spleen: Within normal limits in size and appearance. Adrenals/Urinary Tract: Adrenal glands are normal. Kidneys are normal in size with a 1.3 cm cyst over the mid pole. Couple sub cm left renal cortical hypodensities too small to characterize but likely cysts. No hydronephrosis or nephrolithiasis. Ureters are normal in caliber and otherwise unremarkable. Bladder is normal. Stomach/Bowel: Stomach and small bowel are within normal. Appendix is normal. There is diverticulosis of the colon. Vascular/Lymphatic: No pathologically enlarged lymph nodes. Moderate calcified plaque over the abdominal aorta. Stable 3.2 cm aneurysm 8 dilatation of the distal abdominal aorta. Reproductive: No mass or other significant abnormality. Other: Left inguinal hernia containing only peritoneal fat. Musculoskeletal:  Degenerative change of the spine and hips.  IMPRESSION: No acute findings in the abdomen/pelvis. Dilatation of the common bile duct measuring 1.7 cm with biliary stent in place. Mild pneumobilia unchanged. 1.3 cm right renal cyst. Sub cm left renal cortical hypodensities too small to characterize but likely cysts. Aortic atherosclerosis. Stable 3.2 cm aneurysm of the distal abdominal aorta. Diverticulosis of the colon. Small pericardial effusion unchanged. Left inguinal hernia containing only peritoneal fat. Electronically Signed   By: [place] Olp M.D.   On: 03/26/2016 18:18   I have personally reviewed and evaluated these images and lab results as part of my medical decision-making.   EKG Interpretation   Date/Time:  Monday March 26 2016 15:17:30 EDT Ventricular Rate:  105 PR Interval:    QRS Duration: 113 QT Interval:  344 QTC Calculation: 455 R Axis:   -73 Text Interpretation:  Sinus or ectopic atrial tachycardia LVH with  secondary repolarization abnormality Inferior infarct, old Anterior  infarct, old No significant change since last tracing Confirmed by  [facility]  MD, Loree Fee ([id number]) on 03/26/2016 3:29:16 PM      MDM   Final diagnoses:  Sepsis, due to unspecified organism [facility])    Patient is an elderly male with a history of coronary artery disease, diabetes, hypertension, gallstone pancreatitis status post cholecystectomy, COPD presenting today with symptoms concerning for sepsis. Upon arrival patient was tachycardic, tachypnea, altered, febrile and mildly hypotensive. He was awake and alert but given his history of dementia was not able to answer any questions. Patient has clear breath sounds but diminished, no evidence of overt edema but appears to have abdominal pain on palpation. EKG unchanged from prior, chest x-ray without acute findings, UA within normal limits, lactic acid elevated at 3.35, CMP with new elevation of his LFT's AST 136, ALT 77, total bilirubin 1.7 and new acute kidney injury 1.42. Lipase within normal  limits. CBC with mild leukocytosis of 12.7.  Patient was covered with Rocephin and Flagyl for concern for abdominal pathology. He was given a bolus as indicated per weight by sepsis protocol. CT of the abdomen and pelvis, head CT and troponin pending  CT neg for acute pathology.  Patient improved after fluids and repeat lactate within normal limits.  Patient received Rocephin and Flagyl concern for an abdominal process but at this time he has a fever of unknown origin. Patient will be admitted to the hospitalist service.  I personally performed the services described in this documentation, which was scribed in my presence.  The recorded information has been reviewed and considered.     Todd Dessert, MD 03/26/16 [phone]

## 2016-03-26 NOTE — ED Notes (Signed)
Patient transported to CT via stretcher per tech. 

## 2016-03-26 NOTE — Plan of Care (Signed)
82 yo M  from [facility] PCP [place] Hx of dementia at baseline.  Not feeling well more confused In ER meeting sepsis criteria  questionable Abd pain UA neg WBC 12.5 Lactic acid 3.5 but after sluids down below 2 CT abd non acute. LFT's AST 135 ALt 177 no gallbladder, no change in the stent Trop 0.03 HR 98  124/79 at 19:08 SIRS no source, but much better with fluids. He is DNR  Jauna Raczynski 7:11 PM

## 2016-03-26 NOTE — ED Notes (Signed)
MD at bedside. 

## 2016-03-26 NOTE — ED Notes (Signed)
Per spouse patient has hx of dementia but has been acting differently today.  States decreased appetite.  Reports this is very abnormal for him.  Reports difficulty with ambulation.  States patient has walker but can get around okay using rails on wall and hasn't been able to today.  Sent from PCP

## 2016-03-26 NOTE — ED Notes (Signed)
Palma Holter and JC were given verbal report.

## 2016-03-26 NOTE — ED Notes (Signed)
Spoke with MD regarding fluid orders after cxr results - instructed to give fluids as ordered.

## 2016-03-27 ENCOUNTER — Encounter (HOSPITAL_COMMUNITY): Payer: Self-pay | Admitting: Internal Medicine

## 2016-03-27 DIAGNOSIS — J439 Emphysema, unspecified: Secondary | ICD-10-CM

## 2016-03-27 DIAGNOSIS — I10 Essential (primary) hypertension: Secondary | ICD-10-CM

## 2016-03-27 DIAGNOSIS — G9340 Encephalopathy, unspecified: Secondary | ICD-10-CM | POA: Diagnosis present

## 2016-03-27 DIAGNOSIS — E860 Dehydration: Secondary | ICD-10-CM | POA: Diagnosis present

## 2016-03-27 DIAGNOSIS — I2510 Atherosclerotic heart disease of native coronary artery without angina pectoris: Secondary | ICD-10-CM

## 2016-03-27 DIAGNOSIS — R6510 Systemic inflammatory response syndrome (SIRS) of non-infectious origin without acute organ dysfunction: Secondary | ICD-10-CM

## 2016-03-27 LAB — COMPREHENSIVE METABOLIC PANEL
ALBUMIN: 2.5 g/dL — AB (ref 3.5–5.0)
ALT: 60 U/L (ref 17–63)
ANION GAP: 9 (ref 5–15)
AST: 80 U/L — ABNORMAL HIGH (ref 15–41)
Alkaline Phosphatase: 116 U/L (ref 38–126)
BILIRUBIN TOTAL: 1 mg/dL (ref 0.3–1.2)
BUN: 12 mg/dL (ref 6–20)
CO2: 23 mmol/L (ref 22–32)
Calcium: 8.5 mg/dL — ABNORMAL LOW (ref 8.9–10.3)
Chloride: 107 mmol/L (ref 101–111)
Creatinine, Ser: 1.09 mg/dL (ref 0.61–1.24)
GFR calc Af Amer: >60 mL/min (ref >60)
GFR, EST NON AFRICAN AMERICAN: 59 mL/min — AB (ref >60)
GLUCOSE: 189 mg/dL — AB (ref 65–99)
POTASSIUM: 3.5 mmol/L (ref 3.5–5.1)
Sodium: 139 mmol/L (ref 135–145)
TOTAL PROTEIN: 5.3 g/dL — AB (ref 6.5–8.1)

## 2016-03-27 LAB — GLUCOSE, CAPILLARY
GLUCOSE-CAPILLARY: 163 mg/dL — AB (ref 65–99)
Glucose-Capillary: 198 mg/dL — ABNORMAL HIGH (ref 65–99)
Glucose-Capillary: 154 mg/dL — ABNORMAL HIGH (ref 65–99)
Glucose-Capillary: 208 mg/dL — ABNORMAL HIGH (ref 65–99)
Glucose-Capillary: 168 mg/dL — ABNORMAL HIGH (ref 65–99)

## 2016-03-27 LAB — CBC
HEMATOCRIT: 37.9 % — AB (ref 39.0–52.0)
HEMOGLOBIN: 12.4 g/dL — AB (ref 13.0–17.0)
MCH: 32 pg (ref 26.0–34.0)
MCHC: 32.7 g/dL (ref 30.0–36.0)
MCV: 97.7 fL (ref 78.0–100.0)
Platelets: 210 10*3/uL (ref 150–400)
RBC: 3.88 MIL/uL — ABNORMAL LOW (ref 4.22–5.81)
RDW: 13.4 % (ref 11.5–15.5)
WBC: 6.8 10*3/uL (ref 4.0–10.5)

## 2016-03-27 LAB — PHOSPHORUS: PHOSPHORUS: 2.8 mg/dL (ref 2.5–4.6)

## 2016-03-27 LAB — MAGNESIUM: MAGNESIUM: 1.7 mg/dL (ref 1.7–2.4)

## 2016-03-27 LAB — URINE CULTURE: Culture: NO GROWTH

## 2016-03-27 LAB — PROCALCITONIN: PROCALCITONIN: 0.63 ng/mL

## 2016-03-27 LAB — TSH: TSH: 4.325 u[IU]/mL (ref 0.350–4.500)

## 2016-03-27 MED ORDER — ACETAMINOPHEN 650 MG RE SUPP: 650.0000 mg | Freq: Four times a day (QID) | RECTAL | Status: DC | PRN

## 2016-03-27 MED ORDER — ASPIRIN 325 MG PO TABS
162.5000 mg | ORAL_TABLET | Freq: Every day | ORAL | Status: DC
  Administered 2016-03-27 – 2016-03-30 (×4): 162.5 mg via ORAL
  Filled 2016-03-27 (×4): qty 1

## 2016-03-27 MED ORDER — ONDANSETRON HCL 4 MG PO TABS: 4.0000 mg | ORAL_TABLET | Freq: Four times a day (QID) | ORAL | Status: DC | PRN

## 2016-03-27 MED ORDER — PANTOPRAZOLE SODIUM 40 MG PO TBEC
40.0000 mg | DELAYED_RELEASE_TABLET | Freq: Every day | ORAL | Status: DC
  Administered 2016-03-27 – 2016-03-30 (×4): 40 mg via ORAL
  Filled 2016-03-27 (×4): qty 1

## 2016-03-27 MED ORDER — SODIUM CHLORIDE 0.9% FLUSH
3.0000 mL | Freq: Two times a day (BID) | INTRAVENOUS | Status: DC
  Administered 2016-03-27 – 2016-03-30 (×5): 3 mL via INTRAVENOUS

## 2016-03-27 MED ORDER — ALBUTEROL SULFATE (2.5 MG/3ML) 0.083% IN NEBU: 2.5000 mg | INHALATION_SOLUTION | RESPIRATORY_TRACT | Status: DC | PRN

## 2016-03-27 MED ORDER — VANCOMYCIN HCL 10 G IV SOLR
1250.0000 mg | INTRAVENOUS | Status: DC
  Administered 2016-03-27 – 2016-03-29 (×3): 1250 mg via INTRAVENOUS
  Filled 2016-03-27 (×3): qty 1250

## 2016-03-27 MED ORDER — TIOTROPIUM BROMIDE MONOHYDRATE 18 MCG IN CAPS
18.0000 ug | ORAL_CAPSULE | Freq: Every day | RESPIRATORY_TRACT | Status: DC
  Administered 2016-03-27 – 2016-03-30 (×3): 18 ug via RESPIRATORY_TRACT
  Filled 2016-03-27: qty 5

## 2016-03-27 MED ORDER — INSULIN ASPART 100 UNIT/ML ~~LOC~~ SOLN
0.0000 [IU] | Freq: Three times a day (TID) | SUBCUTANEOUS | Status: DC
  Administered 2016-03-27 (×2): 3 [IU] via SUBCUTANEOUS
  Administered 2016-03-27 – 2016-03-28 (×2): 2 [IU] via SUBCUTANEOUS
  Administered 2016-03-28: 1 [IU] via SUBCUTANEOUS
  Administered 2016-03-28: 2 [IU] via SUBCUTANEOUS
  Administered 2016-03-29: 3 [IU] via SUBCUTANEOUS
  Administered 2016-03-29: 2 [IU] via SUBCUTANEOUS
  Administered 2016-03-29: 1 [IU] via SUBCUTANEOUS
  Administered 2016-03-30: 2 [IU] via SUBCUTANEOUS

## 2016-03-27 MED ORDER — AZTREONAM 2 G IJ SOLR
2.0000 g | Freq: Once | INTRAMUSCULAR | Status: AC
  Administered 2016-03-27: 2 g via INTRAVENOUS
  Filled 2016-03-27: qty 2

## 2016-03-27 MED ORDER — POLYETHYLENE GLYCOL 3350 17 G PO PACK
17.0000 g | PACK | Freq: Every day | ORAL | Status: DC
  Administered 2016-03-27 – 2016-03-30 (×4): 17 g via ORAL
  Filled 2016-03-27 (×4): qty 1

## 2016-03-27 MED ORDER — DEXTROSE 5 % IV SOLN
2.0000 g | Freq: Three times a day (TID) | INTRAVENOUS | Status: DC
  Administered 2016-03-27 – 2016-03-28 (×4): 2 g via INTRAVENOUS
  Filled 2016-03-27 (×5): qty 2

## 2016-03-27 MED ORDER — SODIUM CHLORIDE 0.9 % IV BOLUS (SEPSIS)
250.0000 mL | Freq: Once | INTRAVENOUS | Status: AC
  Administered 2016-03-27: 250 mL via INTRAVENOUS

## 2016-03-27 MED ORDER — ACETAMINOPHEN 325 MG PO TABS: 650.0000 mg | ORAL_TABLET | Freq: Four times a day (QID) | ORAL | Status: DC | PRN

## 2016-03-27 MED ORDER — ONDANSETRON HCL 4 MG/2ML IJ SOLN: 4.0000 mg | Freq: Four times a day (QID) | INTRAMUSCULAR | Status: DC | PRN

## 2016-03-27 MED ORDER — SIMVASTATIN 20 MG PO TABS
20.0000 mg | ORAL_TABLET | Freq: Every evening | ORAL | Status: DC
  Administered 2016-03-27 – 2016-03-29 (×3): 20 mg via ORAL
  Filled 2016-03-27 (×3): qty 1

## 2016-03-27 MED ORDER — FLUTICASONE FUROATE-VILANTEROL 100-25 MCG/INH IN AEPB: 1.0000 | INHALATION_SPRAY | Freq: Every day | RESPIRATORY_TRACT | Status: DC

## 2016-03-27 MED ORDER — MOMETASONE FURO-FORMOTEROL FUM 200-5 MCG/ACT IN AERO
2.0000 | INHALATION_SPRAY | Freq: Two times a day (BID) | RESPIRATORY_TRACT | Status: DC
  Administered 2016-03-27 – 2016-03-30 (×6): 2 via RESPIRATORY_TRACT
  Filled 2016-03-27: qty 8.8

## 2016-03-27 MED ORDER — MEMANTINE HCL ER 28 MG PO CP24
28.0000 mg | ORAL_CAPSULE | Freq: Every day | ORAL | Status: DC
  Administered 2016-03-27 – 2016-03-30 (×4): 28 mg via ORAL
  Filled 2016-03-27 (×4): qty 1

## 2016-03-27 MED ORDER — ENOXAPARIN SODIUM 40 MG/0.4ML ~~LOC~~ SOLN
40.0000 mg | SUBCUTANEOUS | Status: DC
  Administered 2016-03-27 – 2016-03-30 (×4): 40 mg via SUBCUTANEOUS
  Filled 2016-03-27 (×4): qty 0.4

## 2016-03-27 MED ORDER — VANCOMYCIN HCL IN DEXTROSE 1-5 GM/200ML-% IV SOLN: 1000.0000 mg | Freq: Once | INTRAVENOUS | Status: DC

## 2016-03-27 MED ORDER — INSULIN ASPART 100 UNIT/ML ~~LOC~~ SOLN: 0.0000 [IU] | Freq: Every day | SUBCUTANEOUS | Status: DC

## 2016-03-27 NOTE — H&P (Signed)
[name] [id number] DOB: 03-22-1927 DOA: 03/26/2016     PCP: Stephens Shire, MD   Outpatient Specialists: Pulmonology Sood, Cardiology [place] [facility], Neurology [place] Patient coming from:   home Lives  With family   Chief Complaint: fatigue and confusion worsening than baseline  HPI: [name] is a 82 y.o. male with medical history significant of CAD sp MI, DM, HTn, HL, TIA  Second degree AV block mild type II status post dual chamber pace maker, COPD, dementia history of recurrent gallstone induced pancreatitis status post cholecystectomy now  Presented with he has been acting more confused lately  Had not been eating well has been having trouble ambulating. He usually  Just walks holding onto rails but lately had required a walker in emergency department required 3 people assist. She noticed some foul smell in the urine. Reports that a few days ago he has fallen down .but not today His medications  Have changed he hasn't been taking his metoprolol. Wife did not notice any coughing of fevers Regarding pertinent Chronic problems: patient has been followed by cardiology last time pacemaker was interrogated was in May 2017 is found to be normal.  Regarding coronary artery disease currently on medical therapy only.  In the past he had history of Enterobacter bacteremia He is followed by pulmonology for his COPD  Maintained on DuoNebSymbicort   IN ER: presented  to [facility] initially febrile up to 100.9 heart rate up to 107 respirations up to 28 blood pressure now 90/76 Lactic acid 3.35 WBC 12.7  Hemoglobin 14.5 platelets 305 creatinine 1.42 which is slightly above baseline and troponin 0.03She was aggressively rehydrated repeat lactic acid was down to 1.6 And blood pressure up to 120s patient felt much better and started to be less confused Chest x-ray showing enlarged cardiac silhouette and pulmonary vascular congestion no  Infiltrate CT of the abdomen non  acute CT head stable Patient was given  Tylenol, Rocephin IV, Flagyl IV,, Haldol 1 mg, he was given total of 2.5 L of normal saline patient was transferred from [facility] cone accepted to telemetry floor given rapid improvement     Hospitalist was called for admission for SIRS and dehydration  Review of Systems:    Pertinent positives include: confusion fatigue, weight loss   Constitutional:  No weight loss, night sweats, Fevers, chills, change in color of urine HEENT:  No headaches, Difficulty swallowing,Tooth/dental problems,Sore throat,  No sneezing, itching, ear ache, nasal congestion, post nasal drip,  Cardio-vascular:  No chest pain, Orthopnea, PND, anasarca, dizziness, palpitations.no Bilateral lower extremity swelling  GI:  No heartburn, indigestion, abdominal pain, nausea, vomiting, diarrhea, change in bowel habits, loss of appetite, melena, blood in stool, hematemesis Resp:  no shortness of breath at rest. No dyspnea on exertion, No excess mucus, no productive cough, No non-productive cough, No coughing up of blood.No change in color of mucus.No wheezing. Skin:  no rash or lesions. No jaundice GU:  no dysuria, , no urgency or frequency. No straining to urinate.  No flank pain.  Musculoskeletal:  No joint pain or no joint swelling. No decreased range of motion. No back pain.  Psych:  No change in mood or affect. No depression or anxiety. No memory loss.  Neuro: no localizing neurological complaints, no tingling, no weakness, no double vision, no gait abnormality, no slurred speech,    As per HPI otherwise 10 point review of systems negative.   Past Medical History: Past Medical History  Diagnosis Date  . Coronary artery disease   . Diabetes mellitus   . Hypertension   . Hyperlipidemia   . Obesities, morbid ([place])   . Gallstone pancreatitis     recurrent  . TIA (transient ischemic attack)   . Bacteremia due to vancomycin resistant Enterococcus   . GERD  (gastroesophageal reflux disease)   . Hearing impairment   . Myocardial infarction, anterior wall, subsequent care   . Second degree AV block, Mobitz type II     a. s/p STJ dual chamber PPM  . Duodenal ulcer   . GI bleed   . COPD (chronic obstructive pulmonary disease) with chronic bronchitis ([place])   . Dementia in Alzheimer's disease with delirium   . Cancer [facility])     precancer skin lesions   Past Surgical History  Procedure Laterality Date  . Cholecystectomy    . Pacemaker placement  01/01/11    STJ dual chamber PPM implanted by Dr Rayann Heman for Mobitz II   . Pancreatic stent    . Ercp N/A 05/30/2014    Procedure: ENDOSCOPIC RETROGRADE CHOLANGIOPANCREATOGRAPHY (ERCP);  Surgeon: Ladene Artist, MD;  Location: [facility] ENDOSCOPY;  Service: Endoscopy;  Laterality: N/A;  . Ercp N/A 07/09/2014    Procedure: ENDOSCOPIC RETROGRADE CHOLANGIOPANCREATOGRAPHY (ERCP);  Surgeon: Beryle Beams, MD;  Location: [facility] ENDOSCOPY;  Service: Endoscopy;  Laterality: N/A;  . Biliary stent placement N/A 07/09/2014    Procedure: BILIARY STENT PLACEMENT;  Surgeon: Beryle Beams, MD;  Location: [facility] ENDOSCOPY;  Service: Endoscopy;  Laterality: N/A;     Social History:  Ambulatory  walker      reports that he quit smoking about 20 years ago. His smoking use included Cigarettes. He has a 57 pack-year smoking history. He has never used smokeless tobacco. He reports that he does not drink alcohol or use illicit drugs.  Allergies:   Allergies  Allergen Reactions  . Doxycycline Other (See Comments)    Unknown- patient doesn't remember  . Penicillins Rash    Family History:    Family History  Problem Relation Age of Onset  . Hypertension Other   . Sudden Cardiac Death Father   . Cerebral aneurysm Mother     Medications: Prior to Admission medications   Medication Sig Start Date End Date Taking? Authorizing Provider  albuterol (PROVENTIL HFA;VENTOLIN HFA) 108 (90 BASE) MCG/ACT inhaler Inhale 2 puffs into the  lungs every 4 (four) hours as needed. For shortness of breath    Historical Provider, MD  aspirin 325 MG tablet Take 162.5 mg by mouth daily.     Historical Provider, MD  budesonide-formoterol (SYMBICORT) 160-4.5 MCG/ACT inhaler Inhale 2 puffs into the lungs 2 (two) times daily.    Historical Provider, MD  fluticasone furoate-vilanterol (BREO ELLIPTA) 100-25 MCG/INH AEPB Inhale 1 puff into the lungs daily. 12/21/15   Chesley Mires, MD  folic acid (FOLVITE) 1 MG tablet Take 1 mg by mouth daily.    Historical Provider, MD  hydrochlorothiazide (HYDRODIURIL) 25 MG tablet Take 12.5 mg by mouth every morning.     Historical Provider, MD  losartan (COZAAR) 50 MG tablet Take 50 mg by mouth every morning.    Historical Provider, MD  MAGNESIUM SULFATE PO Take 1 tablet by mouth daily.    Historical Provider, MD  memantine (NAMENDA XR) 28 MG CP24 24 hr capsule Take 28 mg by mouth daily.    Historical Provider, MD  metoprolol (LOPRESSOR) 50 MG tablet Take 1 tablet (50 mg total) by mouth  2 (two) times daily. 10/14/15   [name], MD  Multiple Vitamins-Minerals (CENTRUM SILVER PO) Take 1 tablet by mouth every morning.     Historical Provider, MD  Omega-3 Fatty Acids (FISH OIL PO) Take 2 capsules by mouth daily.    Historical Provider, MD  omeprazole (PRILOSEC) 20 MG capsule Take 20 mg by mouth 2 (two) times daily. 03/01/15   Historical Provider, MD  polyethylene glycol (MIRALAX / GLYCOLAX) packet Take 17 g by mouth daily.    Historical Provider, MD  simvastatin (ZOCOR) 20 MG tablet Take 20 mg by mouth every evening.     Historical Provider, MD  tiotropium (SPIRIVA) 18 MCG inhalation capsule Place 18 mcg into inhaler and inhale daily.    Historical Provider, MD    Physical Exam: Patient Vitals for the past 24 hrs:  BP Temp Temp src Pulse Resp SpO2 Height Weight  03/26/16 2126 140/81 mmHg 98.6 F (37 C) Oral 95 18 98 % 5\' 9"  (1.753 m) 85.276 kg (188 lb)  03/26/16 2002 - 98.1 F (36.7 C) Oral - - - - -    03/26/16 1908 124/79 mmHg - - 98 21 96 % - -  03/26/16 1831 - 98.6 F (37 C) Oral 101 23 94 % - -  03/26/16 1730 108/95 mmHg - - - - - - -  03/26/16 1726 - 97.8 F (36.6 C) Oral - - - - -  03/26/16 1700 112/75 mmHg - - - 22 - - -  03/26/16 1630 120/82 mmHg - - 103 25 93 % - -  03/26/16 1616 108/74 mmHg - - 106 (!) 28 94 % - -  03/26/16 1527 - - - - - - - 90.719 kg (200 lb)  03/26/16 1515 117/79 mmHg - - 105 26 95 % - -  03/26/16 1445 90/76 mmHg 100.9 F (38.3 C) Rectal 107 20 95 % 5\' 4"  (1.626 m) 81.647 kg (180 lb)    1. General:  in No Acute distress 2. Psychological: somnolent not Oriented 3. Head/ENT:   Dry Mucous Membranes                          Head Non traumatic, neck supple                            Poor Dentition 4. SKIN: normal  Skin turgor,  Skin clean Dry and intact no rash 5. Heart: Regular rate and rhythm no  Murmur, Rub or gallop 6. Lungs: snoring respirations no wheezes or crackles   7. Abdomen: Soft, non-tender, distended 8. Lower extremities: no clubbing, cyanosis, or edema 9. Neurologically Grossly intact, moving all 4 extremities equally 10. MSK: Normal range of motion   body mass index is 27.75 kg/(m^2).  Labs on Admission:   Labs on Admission: I have personally reviewed following labs and imaging studies  CBC:  Recent Labs Lab 03/26/16 1430  WBC 12.7*  NEUTROABS 10.3*  HGB 14.5  HCT 42.6  MCV 95.5  PLT 123456   Basic Metabolic Panel:  Recent Labs Lab 03/26/16 1430  NA 136  K 4.0  CL 99*  CO2 24  GLUCOSE 248*  BUN 17  CREATININE 1.42*  CALCIUM 9.0   GFR: Estimated Creatinine Clearance: 40.4 mL/min (by C-G formula based on Cr of 1.42). Liver Function Tests:  Recent Labs Lab 03/26/16 1430  AST 136*  ALT 77*  ALKPHOS 152*  BILITOT 1.7*  PROT 6.9  ALBUMIN 3.3*    Recent Labs Lab 03/26/16 1430  LIPASE 14   No results for input(s): AMMONIA in the last 168 hours. Coagulation Profile: No results for input(s): INR,  PROTIME in the last 168 hours. Cardiac Enzymes:  Recent Labs Lab 03/26/16 1430  TROPONINI 0.03*   BNP (last 3 results) No results for input(s): PROBNP in the last 8760 hours. HbA1C: No results for input(s): HGBA1C in the last 72 hours. CBG: No results for input(s): GLUCAP in the last 168 hours. Lipid Profile: No results for input(s): CHOL, HDL, LDLCALC, TRIG, CHOLHDL, LDLDIRECT in the last 72 hours. Thyroid Function Tests: No results for input(s): TSH, T4TOTAL, FREET4, T3FREE, THYROIDAB in the last 72 hours. Anemia Panel: No results for input(s): VITAMINB12, FOLATE, FERRITIN, TIBC, IRON, RETICCTPCT in the last 72 hours. Urine analysis:    Component Value Date/Time   COLORURINE AMBER* 03/26/2016 [place] 03/26/2016 1515   LABSPEC 1.020 03/26/2016 1515   PHURINE 7.0 03/26/2016 1515   GLUCOSEU NEGATIVE 03/26/2016 1515   HGBUR NEGATIVE 03/26/2016 1515   BILIRUBINUR SMALL* 03/26/2016 1515   KETONESUR NEGATIVE 03/26/2016 1515   PROTEINUR NEGATIVE 03/26/2016 1515   UROBILINOGEN 2.0* 06/14/2015 0840   NITRITE NEGATIVE 03/26/2016 1515   LEUKOCYTESUR NEGATIVE 03/26/2016 1515   Sepsis Labs: @LABRCNTIP (procalcitonin:4,lacticidven:4) ) Recent Results (from the past 240 hour(s))  Culture, blood (Routine x 2)     Status: None (Preliminary result)   Collection Time: 03/26/16  2:40 PM  Result Value Ref Range Status   Specimen Description   Final    BLOOD RIGHT ANTECUBITAL Performed at [facility]    Special Requests BOTTLES DRAWN AEROBIC AND ANAEROBIC [facility]  Final   Culture PENDING  Incomplete   Report Status PENDING  Incomplete  Culture, blood (Routine x 2)     Status: None (Preliminary result)   Collection Time: 03/26/16  2:50 PM  Result Value Ref Range Status   Specimen Description   Final    BLOOD LEFT ANTECUBITAL Performed at [facility]    Special Requests BOTTLES DRAWN AEROBIC AND ANAEROBIC [facility]  Final   Culture PENDING   Incomplete   Report Status PENDING  Incomplete      UA    no evidence of UTI  Lab Results  Component Value Date   HGBA1C 7.6* 12/04/2013    Estimated Creatinine Clearance: 40.4 mL/min (by C-G formula based on Cr of 1.42).  BNP (last 3 results) No results for input(s): PROBNP in the last 8760 hours.   ECG REPORT  Independently reviewed Rate:105  Rhythm:  Atrial tachycardia with LVH ST&T Change: No acute ischemic changes   QTC 455  Filed Weights   03/26/16 1445 03/26/16 1527 03/26/16 2126  Weight: 81.647 kg (180 lb) 90.719 kg (200 lb) 85.276 kg (188 lb)     Cultures:    Component Value Date/Time   SDES  03/26/2016 1450    BLOOD LEFT ANTECUBITAL Performed at [place] 03/26/2016 1450   CULT PENDING 03/26/2016 1450   REPTSTATUS PENDING 03/26/2016 1450     Radiological Exams on Admission: Dg Chest 2 View  03/26/2016  CLINICAL DATA:  Dementia, shortness of breath, coronary disease, diabetes mellitus, hypertension EXAM: CHEST  2 VIEW COMPARISON:  06/14/2015 FINDINGS: LEFT subclavian transvenous pacemaker leads project at RIGHT atrium and RIGHT ventricle. Enlargement of cardiac silhouette with slight vascular congestion.  Atherosclerotic calcification aorta. Motion artifacts degrade lateral view. Emphysematous changes without infiltrate, pleural effusion, or pneumothorax. Bones demineralized. IMPRESSION: Enlargement of cardiac silhouette with pulmonary vascular congestion post pacemaker. COPD changes without acute infiltrate. Aortic atherosclerosis. Electronically Signed   By: Lavonia Dana M.D.   On: 03/26/2016 15:15   Ct Head Wo Contrast  03/26/2016  CLINICAL DATA:  Altered mental status, difficulty with ambulation. EXAM: CT HEAD WITHOUT CONTRAST TECHNIQUE: Contiguous axial images were obtained from the base of the skull through the vertex without intravenous contrast. COMPARISON:  Head CT 06/14/2015 FINDINGS:  Brain: Stable atrophy and chronic small vessel ischemia. No intracranial hemorrhage, mass effect, or midline shift. Chronic ischemia versus remote lacunar infarct in the pons, unchanged. No hydrocephalus. The basilar cisterns are patent. No evidence of territorial infarct. No intracranial fluid collection. Vascular: No hyperdense vessel or abnormal calcification. Skull:  Calvarium is intact. Sinuses/Orbits: Included paranasal sinuses and mastoid air cells are well aerated. Other: None. IMPRESSION: Stable atrophy and chronic small vessel ischemia. No acute intracranial abnormality. Electronically Signed   By: Jeb Levering M.D.   On: 03/26/2016 18:13   Ct Abdomen Pelvis W Contrast  03/26/2016  CLINICAL DATA:  Dementia.  Decreased appetite. EXAM: CT ABDOMEN AND PELVIS WITH CONTRAST TECHNIQUE: Multidetector CT imaging of the abdomen and pelvis was performed using the standard protocol following bolus administration of intravenous contrast. CONTRAST:  75 mL Isovue-300 IV. COMPARISON:  05/28/2014 and 04/19/2011 FINDINGS: Lower chest: Lung bases demonstrate minimal scarring. Cardiac pacer leads are present. There is a small amount of pericardial fluid unchanged. Sub cm right pericardial phrenic lymph node unchanged. Hepatobiliary: Previous cholecystectomy. Biliary stent in place new since the prior exam. Prominence of the common bile duct measuring 1.7 cm slightly worse. Minimal air within the common duct and left intrahepatic biliary tree unchanged. No focal liver mass. Pancreas: No mass, inflammatory changes, or other significant abnormality. Spleen: Within normal limits in size and appearance. Adrenals/Urinary Tract: Adrenal glands are normal. Kidneys are normal in size with a 1.3 cm cyst over the mid pole. Couple sub cm left renal cortical hypodensities too small to characterize but likely cysts. No hydronephrosis or nephrolithiasis. Ureters are normal in caliber and otherwise unremarkable. Bladder is normal.  Stomach/Bowel: Stomach and small bowel are within normal. Appendix is normal. There is diverticulosis of the colon. Vascular/Lymphatic: No pathologically enlarged lymph nodes. Moderate calcified plaque over the abdominal aorta. Stable 3.2 cm aneurysm 8 dilatation of the distal abdominal aorta. Reproductive: No mass or other significant abnormality. Other: Left inguinal hernia containing only peritoneal fat. Musculoskeletal:  Degenerative change of the spine and hips. IMPRESSION: No acute findings in the abdomen/pelvis. Dilatation of the common bile duct measuring 1.7 cm with biliary stent in place. Mild pneumobilia unchanged. 1.3 cm right renal cyst. Sub cm left renal cortical hypodensities too small to characterize but likely cysts. Aortic atherosclerosis. Stable 3.2 cm aneurysm of the distal abdominal aorta. Diverticulosis of the colon. Small pericardial effusion unchanged. Left inguinal hernia containing only peritoneal fat. Electronically Signed   By: Marin Olp M.D.   On: 03/26/2016 18:18    Chart has been reviewed    Assessment/Plan  82 y.o. male with medical history significant of CAD sp MI, DM, HTn, HL, TIA  Second degree AV block mild type II status post dual chamber pace maker, COPD, dementia history of recurrent gallstone induced pancreatitis status post cholecystectomy here with Dehydration and SIRS  Present on Admission:  . SIRS (systemic inflammatory response syndrome) (HCC) -  unclear source. For now overnight covered with history in am and think. Await results of blood cultures possibly more related to dehydration but given the low-grade fever will observe carefully . Chronic renal insufficiency, stage II (mild)currently stable . COPD with emphysema (HCC)n - stable continue home medications currently does not appear to be exacerbation . Coronary artery diseases - stable continue home medications . Dementia with behavioral disturbance - anticipate some sundowning responded well to  Haldol will continue for now  On a transmitted . Systolic CHF, chronic (HCC) - chronic opioid fluid overload monitor for any signs . Hypertension - hold home medications given history of tension initially restart when  able . Second degree AV block, Mobitz type II - stable status post pacemaker . Acute encephalopathy - issues with chronic history of dementia somewhat worse  Encephalopathy in the setting of SIRS and dehydration currently improving . Dehydration - will rehydrate Debility   - would benefit from PT OT evaluation once able to tolerate  Other plan as per orders.  DVT prophylaxis:    Lovenox     Code Status:   DNR/DNI as per ER MD  Family Communication:   Family not  at  Bedside    Disposition Plan:   likely will need placement for rehabilitation                          Consults called: none  Admission status:    inpatient        Level of care    tele         Frannie 03/27/2016, 1:19 AM    [facility]  Pager [phone]   after 2 AM please page floor coverage PA If 7AM-7PM, please contact the day team taking care of the patient  [email or web address]  Password [email or web address]

## 2016-03-27 NOTE — Progress Notes (Signed)
Patient admitted earlier today by Dr. Toy Baker. See full H&P for details.  82 year old with history of dementia, coronary artery disease, diabetes, hypertension, dementia, that presented with increased confusion and trouble ambulating. Patient had presented [facility] [place] was found to be septic with a fever of 100.75F, tachycardia, tachypnea, elevated lactic acid level and leukocytosis. Chest x-ray showed enlarged cardiac silhouette and pulmonary vascular congestion, no infiltrate. CT of the abdomen unremarkable, CT of the head unremarkable. Patient was admitted with SIRS/dehydration secondary to unclear etiology. Lactic acid and leukocytosis resolved. Patient was placed on empiric antibiotics, vancomycin and aztreonam (penicillin allergy). Blood and urine cultures pending.  Time spent: 30 minutes  Afreen Siebels D.O. [facility] Pager [phone]  If 7PM-7AM, please contact night-coverage [email or web address] Password [email or web address] 03/27/2016, 12:07 PM

## 2016-03-27 NOTE — Progress Notes (Addendum)
Pt received via EMS around 10 pm, pt was confused, agitated and hostile on receiving, has a history of baseline dementia, vitals stable, on Tele,  running NSR, called dr for PRN haldol and 1 dose provided, after that pt is resting well, IV fluid continue @75cc /hr, MD aware pt's arrival, to verify home medicine called pharmacy but they said that nobody is available till tomorrow am, and pt's family members are not in bed side also. Unable to do admission process since pt is unable to answer the questions, will continue to monitor the patient.

## 2016-03-27 NOTE — Progress Notes (Signed)
Pharmacy Antibiotic Note  [name] is a 82 y.o. male admitted on 03/26/2016 with sepsis.  Pharmacy has been consulted for Vancocin and Azactam dosing.  Plan: Vancomycin 1250mg  IV every 24 hours.  Goal trough 15-20 mcg/mL. Aztreonam 2g IV every 8 hours.  Height: 5\' 9"  (175.3 cm) Weight: 188 lb (85.276 kg) IBW/kg (Calculated) : 70.7  Temp (24hrs), Avg:98.8 F (37.1 C), Min:97.8 F (36.6 C), Max:100.9 F (38.3 C)   Recent Labs Lab 03/26/16 1430 03/26/16 1433 03/26/16 1835  WBC 12.7*  --   --   CREATININE 1.42*  --   --   LATICACIDVEN  --  3.35* 1.60    Estimated Creatinine Clearance: 40.4 mL/min (by C-G formula based on Cr of 1.42).    Allergies  Allergen Reactions  . Doxycycline Other (See Comments)    Unknown- patient doesn't remember  . Penicillins Rash     Thank you for allowing pharmacy to be a part of this patient's care.  Wynona Neat, PharmD, BCPS  03/27/2016 1:37 AM

## 2016-03-28 DIAGNOSIS — I5022 Chronic systolic (congestive) heart failure: Secondary | ICD-10-CM

## 2016-03-28 DIAGNOSIS — G9340 Encephalopathy, unspecified: Secondary | ICD-10-CM

## 2016-03-28 DIAGNOSIS — A419 Sepsis, unspecified organism: Principal | ICD-10-CM

## 2016-03-28 DIAGNOSIS — E1122 Type 2 diabetes mellitus with diabetic chronic kidney disease: Secondary | ICD-10-CM

## 2016-03-28 DIAGNOSIS — N189 Chronic kidney disease, unspecified: Secondary | ICD-10-CM

## 2016-03-28 DIAGNOSIS — J432 Centrilobular emphysema: Secondary | ICD-10-CM

## 2016-03-28 DIAGNOSIS — F0391 Unspecified dementia with behavioral disturbance: Secondary | ICD-10-CM

## 2016-03-28 DIAGNOSIS — E860 Dehydration: Secondary | ICD-10-CM

## 2016-03-28 DIAGNOSIS — I441 Atrioventricular block, second degree: Secondary | ICD-10-CM

## 2016-03-28 DIAGNOSIS — N182 Chronic kidney disease, stage 2 (mild): Secondary | ICD-10-CM

## 2016-03-28 LAB — GLUCOSE, CAPILLARY
GLUCOSE-CAPILLARY: 149 mg/dL — AB (ref 65–99)
Glucose-Capillary: 164 mg/dL — ABNORMAL HIGH (ref 65–99)
Glucose-Capillary: 198 mg/dL — ABNORMAL HIGH (ref 65–99)
Glucose-Capillary: 147 mg/dL — ABNORMAL HIGH (ref 65–99)

## 2016-03-28 LAB — CBC
HEMATOCRIT: 37.8 % — AB (ref 39.0–52.0)
Hemoglobin: 11.9 g/dL — ABNORMAL LOW (ref 13.0–17.0)
MCH: 30.7 pg (ref 26.0–34.0)
MCHC: 31.5 g/dL (ref 30.0–36.0)
MCV: 97.7 fL (ref 78.0–100.0)
Platelets: 223 10*3/uL (ref 150–400)
RBC: 3.87 MIL/uL — AB (ref 4.22–5.81)
RDW: 13.7 % (ref 11.5–15.5)
WBC: 6.2 10*3/uL (ref 4.0–10.5)

## 2016-03-28 LAB — HEMOGLOBIN A1C
Hgb A1c MFr Bld: 7.9 % — ABNORMAL HIGH (ref 4.8–5.6)
Mean Plasma Glucose: 180 mg/dL

## 2016-03-28 LAB — BASIC METABOLIC PANEL
ANION GAP: 8 (ref 5–15)
BUN: 15 mg/dL (ref 6–20)
CHLORIDE: 107 mmol/L (ref 101–111)
CO2: 24 mmol/L (ref 22–32)
Calcium: 8.6 mg/dL — ABNORMAL LOW (ref 8.9–10.3)
Creatinine, Ser: 1.14 mg/dL (ref 0.61–1.24)
GFR calc Af Amer: >60 mL/min (ref >60)
GFR calc non Af Amer: 56 mL/min — ABNORMAL LOW (ref >60)
GLUCOSE: 172 mg/dL — AB (ref 65–99)
POTASSIUM: 3.5 mmol/L (ref 3.5–5.1)
Sodium: 139 mmol/L (ref 135–145)

## 2016-03-28 MED ORDER — POTASSIUM CHLORIDE CRYS ER 20 MEQ PO TBCR
40.0000 meq | EXTENDED_RELEASE_TABLET | Freq: Once | ORAL | Status: AC
  Administered 2016-03-28: 40 meq via ORAL
  Filled 2016-03-28: qty 2

## 2016-03-28 MED ORDER — DEXTROSE 5 % IV SOLN
2.0000 g | INTRAVENOUS | Status: DC
  Administered 2016-03-28: 2 g via INTRAVENOUS
  Filled 2016-03-28 (×2): qty 2

## 2016-03-28 MED ORDER — HALOPERIDOL LACTATE 5 MG/ML IJ SOLN
2.0000 mg | Freq: Four times a day (QID) | INTRAMUSCULAR | Status: DC | PRN
  Administered 2016-03-29 (×4): 2 mg via INTRAVENOUS
  Filled 2016-03-28 (×3): qty 1

## 2016-03-28 NOTE — Progress Notes (Signed)
Patient attempting to get out of bed and says that he is leaving to go home.  It was explained to patient that he is in the hospital and needs to stay here until the doctor says it's ok to leave.  Patient started yelling saying he was going to kill [place] and to let him get up and go home.  He was punching and kicking staff.  Security was called and the patient was given prn haldol.  Patient still aggressive to staff and patient placed in soft wrist restraints with MD order.  Patient's wife was called and she is aware that the patient is in restraints and said she would be in to see him later in the morning.  Will continue to monitor patient.

## 2016-03-28 NOTE — Progress Notes (Signed)
Pharmacy Antibiotic Note [name] is a 82 y.o. male admitted on 03/26/2016 with sepsis. Currently on Azactam and vancomycin, pharmacy asked to transition from Azactam to Cefepime since has tolerated cephalosporins in the past.     Plan:  1. Vancomycin 1250mg  IV every 24 hours.  Goal trough 15-20 mcg/mL.  2. Begin Cefepime 2 grams IV Q24 hours starting now  Height: 5\' 9"  (175.3 cm) Weight: 187 lb 1.6 oz (84.868 kg) IBW/kg (Calculated) : 70.7  Temp (24hrs), Avg:98 F (36.7 C), Min:97.6 F (36.4 C), Max:98.6 F (37 C)   Recent Labs Lab 03/26/16 1430 03/26/16 1433 03/26/16 1835 03/27/16 0734 03/28/16 0224  WBC 12.7*  --   --  6.8 6.2  CREATININE 1.42*  --   --  1.09 1.14  LATICACIDVEN  --  3.35* 1.60  --   --     Estimated Creatinine Clearance: 50.3 mL/min (by C-G formula based on Cr of 1.14).    Allergies  Allergen Reactions  . Doxycycline Other (See Comments)    Unknown- patient doesn't remember  . Penicillins Rash    Antibiotics received since admission:  Cefepime 7/5 >>   Vancomycin 7/4 >  Aztreonam 7/4 > 4/5   Pertinent culture data: 7/3 BCx - ngtd 7/3 UCx - ngF  Thank you for allowing pharmacy to be a part of this patient's care.  Vincenza Hews, PharmD, BCPS 03/28/2016, 12:46 PM Pager: [phone]

## 2016-03-28 NOTE — Progress Notes (Signed)
PROGRESS NOTE  [name]  [id number] DOB: 06-Dec-1926 DOA: 03/26/2016 PCP: Stephens Shire, MD Outpatient Specialists:  Subjective: Confused this morning trying to get off of the bed. Required Haldol and physical restraints.  Brief Narrative:  82 year old male admitted for sepsis syndrome.  Assessment & Plan:   Active Problems:   Coronary artery disease   DM2 (diabetes mellitus, type 2) (HCC)   Hypertension   Second degree AV block, Mobitz type II   Systolic CHF, chronic (HCC)   COPD with emphysema (HCC)   Dementia with behavioral disturbance   Chronic renal insufficiency, stage II (mild)   Sepsis (HCC)   SIRS (systemic inflammatory response syndrome) (HCC)   Acute encephalopathy   Dehydration   Sepsis  -Presented with temperature of 100.9 and WBC of 12.7, suspected infection on admission. -Unclear source of infection, but procalcitonin 0.6 with evidence of end organ damage with lactated 3.35 on admission. -Started on vancomycin and aztreonam (penicillin allergy) initially, will switch to cefepime (tolerated cephalosporins previously) -Given IV fluids and continue IV antibiotics.  Transaminitis -Slight elevation of transaminases on admission, AST 136 and ALT 77 and total bili of 1.7. -CT scan of abdomen pelvis showed chronic pneumobilia and CBD dilatation of 1.7 cm. -Obtain RUQ ultrasound, rule out CBD dilatation or stent malfunction. So far no evidence of cholangitis. -Transaminitis improving ST today is 80, ALT 60 and total bili is 1.0.  AKI on Chronic renal insufficiency, stage II (mild) -Baseline creatinine of 1.1, presented with creatinine 1.42. -This is resolved after initiation of IV fluids. Creatinine today is 1.1.   Acute encephalopathy on background of dementia -Has confusion and disorientation, this is likely acute delirium on top of dementia. -This is probably secondary to metabolic causes including dehydration and sepsis.  COPD with  emphysema -Stable continue home medications currently does not appear to be exacerbation.  Coronary artery diseases - stable continue home medications  Dementia with behavioral disturbance  -Anticipate some sundowning responded well to Haldol will continue for now On a transmitted.  Systolic CHF, chronic (HCC) - chronic, no signs of fluid overload, continue home medications and follow clinically.  Hypertension - hold home medications given history of tension initially restart when able  Second degree AV block, Mobitz type II  -Stable status post pacemaker  Dehydration -With mild a Ki-1 admission, baseline creatinine 1.1 started on IV fluids, improved.  Debility  -Would benefit from PT OT evaluation once able to tolerate    DVT prophylaxis: Lovenox Code Status: DNR Family Communication: No family at bedside Disposition Plan:  Diet: Diet Carb Modified Fluid consistency:: Thin; Room service appropriate?: Yes  Consultants:   None  Procedures:   None  Antimicrobials:   Vancomycin and aztreonam, will switch to cefepime per pharmacist Recs.  Objective: Filed Vitals:   03/27/16 2007 03/27/16 2021 03/28/16 0458 03/28/16 0750  BP: 145/87  158/81   Pulse: 78  78   Temp: 97.6 F (36.4 C)  97.8 F (36.6 C)   TempSrc: Oral  Oral   Resp: 18  18   Height:      Weight:   84.868 kg (187 lb 1.6 oz)   SpO2: 98% 98% 95% 96%    Intake/Output Summary (Last 24 hours) at 03/28/16 1218 Last data filed at 03/28/16 0955  Gross per 24 hour  Intake    393 ml  Output    900 ml  Net   -507 ml   Filed Weights   03/26/16 2126 03/27/16 0453 03/28/16 0458  Weight: 85.276 kg (188 lb) 85.2 kg (187 lb 13.3 oz) 84.868 kg (187 lb 1.6 oz)    Examination: General exam: Appears calm and comfortable  Respiratory system: Clear to auscultation. Respiratory effort normal. Cardiovascular system: S1 & S2 heard, RRR. No JVD, murmurs, rubs, gallops or clicks. No pedal edema. Gastrointestinal  system: Abdomen is nondistended, soft and nontender. No organomegaly or masses felt. Normal bowel sounds heard. Central nervous system: Alert and oriented. No focal neurological deficits. Extremities: Symmetric 5 x 5 power. Skin: No rashes, lesions or ulcers Psychiatry: Judgement and insight appear normal. Mood & affect appropriate.   Data Reviewed: I have personally reviewed following labs and imaging studies  CBC:  Recent Labs Lab 03/26/16 1430 03/27/16 0734 03/28/16 0224  WBC 12.7* 6.8 6.2  NEUTROABS 10.3*  --   --   HGB 14.5 12.4* 11.9*  HCT 42.6 37.9* 37.8*  MCV 95.5 97.7 97.7  PLT 305 210 [id number]   Basic Metabolic Panel:  Recent Labs Lab 03/26/16 1430 03/27/16 0734 03/28/16 0224  NA 136 139 139  K 4.0 3.5 3.5  CL 99* 107 107  CO2 24 23 24   GLUCOSE 248* 189* 172*  BUN 17 12 15   CREATININE 1.42* 1.09 1.14  CALCIUM 9.0 8.5* 8.6*  MG  --  1.7  --   PHOS  --  2.8  --    GFR: Estimated Creatinine Clearance: 50.3 mL/min (by C-G formula based on Cr of 1.14). Liver Function Tests:  Recent Labs Lab 03/26/16 1430 03/27/16 0734  AST 136* 80*  ALT 77* 60  ALKPHOS 152* 116  BILITOT 1.7* 1.0  PROT 6.9 5.3*  ALBUMIN 3.3* 2.5*    Recent Labs Lab 03/26/16 1430  LIPASE 14   No results for input(s): AMMONIA in the last 168 hours. Coagulation Profile: No results for input(s): INR, PROTIME in the last 168 hours. Cardiac Enzymes:  Recent Labs Lab 03/26/16 1430  TROPONINI 0.03*   BNP (last 3 results) No results for input(s): PROBNP in the last 8760 hours. HbA1C:  Recent Labs  03/27/16 0734  HGBA1C 7.9*   CBG:  Recent Labs Lab 03/27/16 0626 03/27/16 1157 03/27/16 1639 03/27/16 2131 03/28/16 0643  GLUCAP 198* 154* 208* 168* 164*   Lipid Profile: No results for input(s): CHOL, HDL, LDLCALC, TRIG, CHOLHDL, LDLDIRECT in the last 72 hours. Thyroid Function Tests:  Recent Labs  03/27/16 0735  TSH 4.325   Anemia Panel: No results for input(s):  VITAMINB12, FOLATE, FERRITIN, TIBC, IRON, RETICCTPCT in the last 72 hours. Urine analysis:    Component Value Date/Time   COLORURINE AMBER* 03/26/2016 [place] 03/26/2016 1515   LABSPEC 1.020 03/26/2016 1515   PHURINE 7.0 03/26/2016 1515   GLUCOSEU NEGATIVE 03/26/2016 1515   HGBUR NEGATIVE 03/26/2016 1515   BILIRUBINUR SMALL* 03/26/2016 1515   KETONESUR NEGATIVE 03/26/2016 1515   PROTEINUR NEGATIVE 03/26/2016 1515   UROBILINOGEN 2.0* 06/14/2015 0840   NITRITE NEGATIVE 03/26/2016 1515   LEUKOCYTESUR NEGATIVE 03/26/2016 1515   Sepsis Labs: @LABRCNTIP (procalcitonin:4,lacticidven:4)  ) Recent Results (from the past 240 hour(s))  Culture, blood (Routine x 2)     Status: None (Preliminary result)   Collection Time: 03/26/16  2:40 PM  Result Value Ref Range Status   Specimen Description BLOOD RIGHT ANTECUBITAL  Final   Special Requests BOTTLES DRAWN AEROBIC AND ANAEROBIC 5CC EACH  Final   Culture   Final    NO GROWTH < 24 HOURS Performed at [facility]  Report Status PENDING  Incomplete  Culture, blood (Routine x 2)     Status: None (Preliminary result)   Collection Time: 03/26/16  2:50 PM  Result Value Ref Range Status   Specimen Description BLOOD LEFT ANTECUBITAL  Final   Special Requests BOTTLES DRAWN AEROBIC AND ANAEROBIC 5CC EACH  Final   Culture   Final    NO GROWTH < 24 HOURS Performed at [facility]    Report Status PENDING  Incomplete  Urine culture     Status: None   Collection Time: 03/26/16  3:15 PM  Result Value Ref Range Status   Specimen Description URINE, CLEAN CATCH  Final   Special Requests NONE  Final   Culture NO GROWTH Performed at [facility]   Final   Report Status 03/27/2016 FINAL  Final     Invalid input(s): PROCALCITONIN, LACTICACIDVEN   Radiology Studies: Dg Chest 2 View  03/26/2016  CLINICAL DATA:  Dementia, shortness of breath, coronary disease, diabetes mellitus, hypertension EXAM: CHEST  2 VIEW  COMPARISON:  06/14/2015 FINDINGS: LEFT subclavian transvenous pacemaker leads project at RIGHT atrium and RIGHT ventricle. Enlargement of cardiac silhouette with slight vascular congestion. Atherosclerotic calcification aorta. Motion artifacts degrade lateral view. Emphysematous changes without infiltrate, pleural effusion, or pneumothorax. Bones demineralized. IMPRESSION: Enlargement of cardiac silhouette with pulmonary vascular congestion post pacemaker. COPD changes without acute infiltrate. Aortic atherosclerosis. Electronically Signed   By: Lavonia Dana M.D.   On: 03/26/2016 15:15   Ct Head Wo Contrast  03/26/2016  CLINICAL DATA:  Altered mental status, difficulty with ambulation. EXAM: CT HEAD WITHOUT CONTRAST TECHNIQUE: Contiguous axial images were obtained from the base of the skull through the vertex without intravenous contrast. COMPARISON:  Head CT 06/14/2015 FINDINGS: Brain: Stable atrophy and chronic small vessel ischemia. No intracranial hemorrhage, mass effect, or midline shift. Chronic ischemia versus remote lacunar infarct in the pons, unchanged. No hydrocephalus. The basilar cisterns are patent. No evidence of territorial infarct. No intracranial fluid collection. Vascular: No hyperdense vessel or abnormal calcification. Skull:  Calvarium is intact. Sinuses/Orbits: Included paranasal sinuses and mastoid air cells are well aerated. Other: None. IMPRESSION: Stable atrophy and chronic small vessel ischemia. No acute intracranial abnormality. Electronically Signed   By: Jeb Levering M.D.   On: 03/26/2016 18:13   Ct Abdomen Pelvis W Contrast  03/26/2016  CLINICAL DATA:  Dementia.  Decreased appetite. EXAM: CT ABDOMEN AND PELVIS WITH CONTRAST TECHNIQUE: Multidetector CT imaging of the abdomen and pelvis was performed using the standard protocol following bolus administration of intravenous contrast. CONTRAST:  75 mL Isovue-300 IV. COMPARISON:  05/28/2014 and 04/19/2011 FINDINGS: Lower chest: Lung  bases demonstrate minimal scarring. Cardiac pacer leads are present. There is a small amount of pericardial fluid unchanged. Sub cm right pericardial phrenic lymph node unchanged. Hepatobiliary: Previous cholecystectomy. Biliary stent in place new since the prior exam. Prominence of the common bile duct measuring 1.7 cm slightly worse. Minimal air within the common duct and left intrahepatic biliary tree unchanged. No focal liver mass. Pancreas: No mass, inflammatory changes, or other significant abnormality. Spleen: Within normal limits in size and appearance. Adrenals/Urinary Tract: Adrenal glands are normal. Kidneys are normal in size with a 1.3 cm cyst over the mid pole. Couple sub cm left renal cortical hypodensities too small to characterize but likely cysts. No hydronephrosis or nephrolithiasis. Ureters are normal in caliber and otherwise unremarkable. Bladder is normal. Stomach/Bowel: Stomach and small bowel are within normal. Appendix is normal. There is diverticulosis of  the colon. Vascular/Lymphatic: No pathologically enlarged lymph nodes. Moderate calcified plaque over the abdominal aorta. Stable 3.2 cm aneurysm 8 dilatation of the distal abdominal aorta. Reproductive: No mass or other significant abnormality. Other: Left inguinal hernia containing only peritoneal fat. Musculoskeletal:  Degenerative change of the spine and hips. IMPRESSION: No acute findings in the abdomen/pelvis. Dilatation of the common bile duct measuring 1.7 cm with biliary stent in place. Mild pneumobilia unchanged. 1.3 cm right renal cyst. Sub cm left renal cortical hypodensities too small to characterize but likely cysts. Aortic atherosclerosis. Stable 3.2 cm aneurysm of the distal abdominal aorta. Diverticulosis of the colon. Small pericardial effusion unchanged. Left inguinal hernia containing only peritoneal fat. Electronically Signed   By: Marin Olp M.D.   On: 03/26/2016 18:18        Scheduled Meds: . aspirin   162.5 mg Oral Daily  . aztreonam  2 g Intravenous Q8H  . enoxaparin (LOVENOX) injection  40 mg Subcutaneous Q24H  . insulin aspart  0-5 Units Subcutaneous QHS  . insulin aspart  0-9 Units Subcutaneous TID WC  . memantine  28 mg Oral Daily  . mometasone-formoterol  2 puff Inhalation BID  . pantoprazole  40 mg Oral Daily  . polyethylene glycol  17 g Oral Daily  . simvastatin  20 mg Oral QPM  . sodium chloride flush  3 mL Intravenous Q12H  . tiotropium  18 mcg Inhalation Daily  . vancomycin  1,250 mg Intravenous Q24H   Continuous Infusions:    LOS: 2 days    Time spent: 35 minutes    Darrion Macaulay A, MD [facility] Pager [phone]  If 7PM-7AM, please contact night-coverage [email or web address] Password [email or web address] 03/28/2016, 12:18 PM

## 2016-03-29 ENCOUNTER — Inpatient Hospital Stay (HOSPITAL_COMMUNITY): Payer: Medicare HMO

## 2016-03-29 LAB — GLUCOSE, CAPILLARY
GLUCOSE-CAPILLARY: 130 mg/dL — AB (ref 65–99)
Glucose-Capillary: 169 mg/dL — ABNORMAL HIGH (ref 65–99)
Glucose-Capillary: 234 mg/dL — ABNORMAL HIGH (ref 65–99)
Glucose-Capillary: 177 mg/dL — ABNORMAL HIGH (ref 65–99)

## 2016-03-29 LAB — BASIC METABOLIC PANEL
Anion gap: 5 (ref 5–15)
BUN: 14 mg/dL (ref 6–20)
CO2: 23 mmol/L (ref 22–32)
CREATININE: 1.06 mg/dL (ref 0.61–1.24)
Calcium: 8.7 mg/dL — ABNORMAL LOW (ref 8.9–10.3)
Chloride: 111 mmol/L (ref 101–111)
GFR calc Af Amer: >60 mL/min (ref >60)
GLUCOSE: 159 mg/dL — AB (ref 65–99)
POTASSIUM: 4 mmol/L (ref 3.5–5.1)
SODIUM: 139 mmol/L (ref 135–145)

## 2016-03-29 LAB — CBC
HEMATOCRIT: 37.6 % — AB (ref 39.0–52.0)
Hemoglobin: 11.9 g/dL — ABNORMAL LOW (ref 13.0–17.0)
MCH: 30.8 pg (ref 26.0–34.0)
MCHC: 31.6 g/dL (ref 30.0–36.0)
MCV: 97.4 fL (ref 78.0–100.0)
PLATELETS: 234 10*3/uL (ref 150–400)
RBC: 3.86 MIL/uL — ABNORMAL LOW (ref 4.22–5.81)
RDW: 13.4 % (ref 11.5–15.5)
WBC: 6.1 10*3/uL (ref 4.0–10.5)

## 2016-03-29 MED ORDER — HALOPERIDOL LACTATE 5 MG/ML IJ SOLN
INTRAMUSCULAR | Status: AC
  Administered 2016-03-29: 2 mg
  Filled 2016-03-29: qty 1

## 2016-03-29 MED ORDER — HALOPERIDOL LACTATE 5 MG/ML IJ SOLN: 2.0000 mg | Freq: Once | INTRAMUSCULAR | Status: DC

## 2016-03-29 NOTE — Evaluation (Signed)
Physical Therapy Evaluation Patient Details Name: [name] MRN: [id number] DOB: March 19, 1927 Today's Date: 03/29/2016   History of Present Illness  82 year old with history of dementia, coronary artery disease, diabetes, hypertension, dementia, that presented with increased confusion and trouble ambulating. Patient had presented to [facility] [place] was found to be septic with a fever of 100.49F, tachycardia, tachypnea, elevated lactic acid level and leukocytosis. Chest x-ray showed enlarged cardiac silhouette and pulmonary vascular congestion, no infiltrate. CT of the abdomen unremarkable, CT of the head unremarkable. Patient was admitted with SIRS/dehydration secondary to unclear etiology  Clinical Impression   the patient was able  To participate and ambulate x 79' with RW. [name] wife is  present and relates that plan is to return home tomorow, possibly. Encouraged her that the patient will need someone with him at all times as he is not at his baseliine functioning level of independent ambulatory in the home.  Pt admitted with above diagnosis. Pt currently with functional limitations due to the deficits listed below (see PT Problem List). Pt will benefit from skilled PT to increase their independence and safety with mobility to allow discharge to the venue listed below.       Follow Up Recommendations No PT follow up (wife states thatcurrently  no HHPT. She will let MDknow if she changes her mind.Marland Kitchen)    Equipment Recommendations  None recommended by PT    Recommendations for Other Services       Precautions / Restrictions Precautions Precautions: Fall      Mobility  Bed Mobility Overal bed mobility: Needs Assistance Bed Mobility: Supine to Sit;Sit to Supine     Supine to sit: Min assist Sit to supine: Min assist   General bed mobility comments: assist with trunk, multimodal cues for activity  Transfers Overall transfer level: Needs assistance Equipment used:  Rolling walker (2 wheeled) Transfers: Sit to/from Stand Sit to Stand: Min assist         General transfer comment: cues to stand up from bed, extra time and multimodal cues to follow directions  Ambulation/Gait Ambulation/Gait assistance: Min assist Ambulation Distance (Feet): 80 Feet Assistive device: Rolling walker (2 wheeled) Gait Pattern/deviations: Wide base of support;Step-to pattern;Step-through pattern;Shuffle;Decreased stride length;Decreased weight shift to right;Decreased weight shift to left        Stairs            Wheelchair Mobility    Modified Rankin (Stroke Patients Only)       Balance Overall balance assessment: Needs assistance Sitting-balance support: Feet supported;Bilateral upper extremity supported Sitting balance-Leahy Scale: Fair     Standing balance support: During functional activity;Bilateral upper extremity supported Standing balance-Leahy Scale: Poor                               Pertinent Vitals/Pain Pain Assessment: Faces Faces Pain Scale: No hurt    Home Living Family/patient expects to be discharged to:: Private residence Living Arrangements: Spouse/significant other;Other relatives;Children Available Help at Discharge: Family;Available 24 hours/day Type of Home: House Home Access: Stairs to enter Entrance Stairs-Rails: Psychiatric nurse of Steps: 2 Home Layout: Multi-level Home Equipment: Walker - 2 wheels;Cane - single point;Grab bars - toilet Additional Comments: sits in tub for bath    Prior Function Level of Independence: Needs assistance   Gait / Transfers Assistance Needed: holds to  things as needed, used  RW plast few days  ADL's / Homemaking Assistance Needed:  feeds self, goes to [facility]        Hand Dominance        Extremity/Trunk Assessment   Upper Extremity Assessment: Generalized weakness           Lower Extremity Assessment: Generalized weakness      Cervical /  Trunk Assessment: Normal  Communication   Communication: HOH  Cognition Arousal/Alertness: Awake/alert Behavior During Therapy: WFL for tasks assessed/performed Overall Cognitive Status: Difficult to assess                      General Comments      Exercises        Assessment/Plan    PT Assessment Patient needs continued PT services  PT Diagnosis Difficulty walking   PT Problem List Decreased strength;Decreased activity tolerance;Decreased balance;Decreased mobility;Decreased cognition;Decreased knowledge of precautions  PT Treatment Interventions DME instruction;Gait training;Functional mobility training;Therapeutic activities;Patient/family education   PT Goals (Current goals can be found in the Care Plan section) Acute Rehab PT Goals Patient Stated Goal: to go home per wife PT Goal Formulation: With family Time For Goal Achievement: 04/12/16 Potential to Achieve Goals: Good    Frequency Min 3X/week   Barriers to discharge        Co-evaluation               End of Session Equipment Utilized During Treatment: Gait belt Activity Tolerance: Patient tolerated treatment well Patient left: in bed;with call bell/phone within reach;with bed alarm set Nurse Communication: Mobility status         Time: 1450-1510 PT Time Calculation (min) (ACUTE ONLY): 20 min   Charges:   PT Evaluation $PT Eval Low Complexity: 1 Procedure     PT G CodesClaretha Cooper 03/29/2016, 3:33 PM Tresa Endo PT [phone]
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
## 2016-03-29 NOTE — Evaluation (Signed)
Occupational Therapy Evaluation Patient Details Name: [name] MRN: [id number] DOB: [birth year] Today's Date: 03/29/2016    History of Present Illness 82 year old with history of dementia, coronary artery disease, diabetes, hypertension, dementia, that presented with increased confusion and trouble ambulating. Patient had presented to [facility] [place] was found to be septic with a fever of 100.80F, tachycardia, tachypnea, elevated lactic acid level and leukocytosis. Chest x-ray showed enlarged cardiac silhouette and pulmonary vascular congestion, no infiltrate. CT of the abdomen unremarkable, CT of the head unremarkable. Patient was admitted with SIRS/dehydration secondary to unclear etiology   Clinical Impression   Pt admitted with above. He demonstrates the below listed deficits and will benefit from continued OT to maximize safety and independence with BADLs.  Pt presents with h/o dementia. He is very HOH and demonstrates generalized weakness.  He is difficult to engage in ADL activities due to now understanding what is being asked of him due to [facility], and tasks being out of context.  He appears to be functioning at min - mod A level.  Wife is not present to compare to his normal status, but she was present PT eval and feels confident with taking hime home.  Per PT, wife not interested in [facility] therapies.       Follow Up Recommendations  No OT follow up;Supervision/Assistance - 24 hour    Equipment Recommendations  None recommended by OT    Recommendations for Other Services       Precautions / Restrictions Precautions Precautions: Fall      Mobility Bed Mobility Overal bed mobility: Needs Assistance Bed Mobility: Supine to Sit;Sit to Supine     Supine to sit: Min guard Sit to supine: Min guard   General bed mobility comments: requires verbal and tactile cues to move to EOB.  Requires increased time   Transfers Overall transfer level: Needs assistance Equipment  used: Rolling walker (2 wheeled) Transfers: Sit to/from [facility] Sit to Stand: Min assist Stand pivot transfers: Min assist       General transfer comment: requires tactile cues to initiate activity and assist to manage RW     Balance Overall balance assessment: Needs assistance Sitting-balance support: Feet supported Sitting balance-Leahy Scale: Fair     Standing balance support: Bilateral upper extremity supported Standing balance-Leahy Scale: Poor                              ADL Overall ADL's : Needs assistance/impaired Eating/Feeding: Set up;Sitting   Grooming: Wash/dry hands;Wash/dry face;Oral care;Min guard;Standing Grooming Details (indicate cue type and reason): requires min guard for balance and cues to initiate activities.  Pt with difficulty understanding what is being asked of him due to [facility] and activities being out of context for him Upper Body Bathing: Minimal assitance;Sitting               Toilet Transfer: Minimal assistance;Ambulation;Comfort height toilet;Grab bars;RW           Functional mobility during ADLs: Minimal assistance;Rolling walker General ADL Comments: unable to engage pt as he was unable to understand what was being asked of him, and why      Vision     Perception     Praxis      Pertinent Vitals/Pain Pain Assessment: Faces Faces Pain Scale: No hurt     Hand Dominance Right   Extremity/Trunk Assessment Upper Extremity Assessment Upper Extremity Assessment: Generalized weakness   Lower  Extremity Assessment Lower Extremity Assessment: Defer to PT evaluation   Cervical / Trunk Assessment Cervical / Trunk Assessment: Normal   Communication Communication Communication: HOH   Cognition Arousal/Alertness: Awake/alert Behavior During Therapy: WFL for tasks assessed/performed Overall Cognitive Status: Difficult to assess                     General Comments       Exercises        Shoulder Instructions      Home Living Family/patient expects to be discharged to:: Private residence Living Arrangements: Spouse/significant other;Other relatives;Children Available Help at Discharge: Family;Available 24 hours/day Type of Home: House Home Access: Stairs to enter [employer] of Steps: 2 Entrance Stairs-Rails: Right;Left Home Layout: Multi-level Alternate Level Stairs-Number of Steps: 3 Alternate Level Stairs-Rails: Right;Left Bathroom Shower/Tub: Tub/shower unit Shower/tub characteristics: Curtain Bathroom Toilet: Handicapped height Bathroom Accessibility: Yes   Home Equipment: Environmental consultant - 2 wheels;Cane - single point;Grab bars - toilet   Additional Comments: sits in tub for bath      Prior Functioning/Environment Level of Independence: Needs assistance  Gait / Transfers Assistance Needed: holds to  things as needed, used  RW plast few days ADL's / Homemaking Assistance Needed: feeds self, goes to BR Communication / Swallowing Assistance Needed: very HOH Comments: wife not present during OT eval     OT Diagnosis: Generalized weakness;Cognitive deficits   OT Problem List: Decreased strength;Decreased activity tolerance;Impaired balance (sitting and/or standing);Decreased safety awareness;Decreased knowledge of use of DME or AE   OT Treatment/Interventions: Self-care/ADL training;DME and/or AE instruction;Therapeutic activities;Patient/family education;Balance training    OT Goals(Current goals can be found in the care plan section) Acute Rehab OT Goals Patient Stated Goal: to go home per wife OT Goal Formulation: Patient unable to participate in goal setting Time For Goal Achievement: 04/12/16 Potential to Achieve Goals: Good ADL Goals Pt Will Perform Upper Body Bathing: with set-up;with supervision;sitting Pt Will Perform Lower Body Bathing: with set-up;with supervision;sit to/from stand Pt Will Transfer to Toilet: with max  assist;ambulating;regular height toilet;bedside commode Pt Will Perform Toileting - Clothing Manipulation and hygiene: with supervision;sit to/from stand  OT Frequency: Min 2X/week   Barriers to D/C:            Co-evaluation              End of Session Equipment Utilized During Treatment: Surveyor, mining Communication: Mobility status  Activity Tolerance: Patient tolerated treatment well Patient left: in bed;with call bell/phone within reach;with bed alarm set   Time: 1547-1601 OT Time Calculation (min): 14 min Charges:  OT General Charges $OT Visit: 1 Procedure OT Evaluation $OT Eval Low Complexity: 1 Procedure G-Codes:    [name] M 04-01-2016, 4:19 PM

## 2016-03-30 LAB — COMPREHENSIVE METABOLIC PANEL
ALBUMIN: 3 g/dL — AB (ref 3.5–5.0)
ALK PHOS: 131 U/L — AB (ref 38–126)
ALT: 40 U/L (ref 17–63)
ANION GAP: 10 (ref 5–15)
AST: 48 U/L — ABNORMAL HIGH (ref 15–41)
BUN: 9 mg/dL (ref 6–20)
CHLORIDE: 106 mmol/L (ref 101–111)
CO2: 23 mmol/L (ref 22–32)
Calcium: 9 mg/dL (ref 8.9–10.3)
Creatinine, Ser: 1.22 mg/dL (ref 0.61–1.24)
GFR calc non Af Amer: 52 mL/min — ABNORMAL LOW (ref >60)
GLUCOSE: 171 mg/dL — AB (ref 65–99)
POTASSIUM: 4.2 mmol/L (ref 3.5–5.1)
SODIUM: 139 mmol/L (ref 135–145)
Total Bilirubin: 0.7 mg/dL (ref 0.3–1.2)
Total Protein: 5.8 g/dL — ABNORMAL LOW (ref 6.5–8.1)

## 2016-03-30 LAB — CBC
HEMATOCRIT: 40.2 % (ref 39.0–52.0)
HEMOGLOBIN: 13.3 g/dL (ref 13.0–17.0)
MCH: 31.8 pg (ref 26.0–34.0)
MCHC: 33.1 g/dL (ref 30.0–36.0)
MCV: 96.2 fL (ref 78.0–100.0)
Platelets: 294 10*3/uL (ref 150–400)
RBC: 4.18 MIL/uL — AB (ref 4.22–5.81)
RDW: 13.2 % (ref 11.5–15.5)
WBC: 9.9 10*3/uL (ref 4.0–10.5)

## 2016-03-30 LAB — GLUCOSE, CAPILLARY
GLUCOSE-CAPILLARY: 166 mg/dL — AB (ref 65–99)
Glucose-Capillary: 174 mg/dL — ABNORMAL HIGH (ref 65–99)

## 2016-03-30 NOTE — Progress Notes (Signed)
Reviewed all discharge instructions with Mr. Giustino wife.  She stated understanding of all.  Stated she is able to manage him at home and can get him into the house once they get home.  Telemetry d/c'd and CCMD contacted and IV d/d'd intact.  No voiced complaints.

## 2016-03-30 NOTE — Discharge Summary (Signed)
Physician Discharge Summary  [name] [id number] DOB: [birth year] DOA: 03/26/2016  PCP: Todd Shire, MD  Admit date: 03/26/2016 Discharge date: 03/30/2016  Admitted From: Home Disposition:  Home  Recommendations for Outpatient Follow-up:  1. Follow up with PCP in 1-2 weeks 2. Please obtain BMP/CBC in one week  Home Health:None, per case manager notes family declined.  Equipment/Devices:None  Discharge Condition:Stable  CODE STATUS:Full code  Diet recommendation: Heart Healthy  Brief/Interim Summary: [name] is a 82 y.o. male with medical history significant of CAD sp MI, DM, HTn, HL, TIA  Second degree AV block mild type II status post dual chamber pace maker, COPD, dementia history of recurrent gallstone induced pancreatitis status post cholecystectomy now  Presented with he has been acting more confused lately Had not been eating well has been having trouble ambulating. He usually Just walks holding onto rails but lately had required a walker in emergency department required 3 people assist. She noticed some foul smell in the urine. Reports that a few days ago he has fallen down .but not today His medications Have changed he hasn't been taking his metoprolol. Wife did not notice any coughing of fevers Regarding pertinent Chronic problems: patient has been followed by cardiology last time pacemaker was interrogated was in May 2017 is found to be normal. Regarding coronary artery disease currently on medical therapy only. In the past he had history of Enterobacter bacteremia He is followed by pulmonology for his COPD Maintained on DuoNebSymbicort  IN ER: presented to [facility] initially febrile up to 100.9 heart rate up to 107 respirations up to 28 blood pressure now 90/76 Lactic acid 3.35 WBC 12.7 Hemoglobin 14.5 platelets 305 creatinine 1.42 which is slightly above baseline and troponin 0.03She was aggressively rehydrated repeat lactic acid was down  to 1.6 And blood pressure up to 120s patient felt much better and started to be less confused Chest x-ray showing enlarged cardiac silhouette and pulmonary vascular congestion no Infiltrate CT of the abdomen non acute CT head stable Patient was given Tylenol, Rocephin IV, Flagyl IV,, Haldol 1 mg, he was given total of 2.5 L of normal saline patient was transferred from [facility] accepted to telemetry floor given rapid improvement  Discharge Diagnoses:  Active Problems:   Coronary artery disease   DM2 (diabetes mellitus, type 2) (HCC)   Hypertension   Second degree AV block, Mobitz type II   Systolic CHF, chronic (HCC)   COPD with emphysema (HCC)   Dementia with behavioral disturbance   Chronic renal insufficiency, stage II (mild)   Sepsis (HCC)   SIRS (systemic inflammatory response syndrome) (HCC)   Acute encephalopathy   Dehydration   Sepsis  -Presented with temperature of 100.9 and WBC of 12.7, suspected infection on admission. -Unclear source of infection, but procalcitonin 0.6 with evidence of end organ damage with lactated 3.35 on admission. -Started on vancomycin and aztreonam (penicillin allergy) initially, switched to cefepime (tolerated cephalosporins previously) -Given IV fluids and antibiotics, fever resolved as well as leukocytosis. -Antibiotics discontinued 24 hours prior to discharge. -Patient was monitored off of antibiotics, continued to be afebrile without leukocytosis so discharged home. -It's unclear to me why he spiked fever, could be transient viral illness or may be retained stone in the CBD and passed it. -Discussed with his daughter prior to discharge if he will fever or more worsening of his mental status to bring him back to the hospital.  Transaminitis -Slight elevation of transaminases on admission, AST 136  and ALT 77 and total bili of 1.7. -CT scan of abdomen pelvis showed chronic pneumobilia and CBD dilatation of 1.7 cm. -RUQ ultrasound showed  presence of stent and mild CBD dilatation at 1.7 cm, nomention of stent malfunction. -Antibiotics discontinued, LFTs back to normal.   AKI on Chronic renal insufficiency, stage II (mild) -Baseline creatinine of 1.1, presented with creatinine 1.42. -This is resolved after initiation of IV fluids. Creatinine today is 1.1.   Acute encephalopathy on background of dementia -Has confusion and disorientation, this is likely acute delirium on top of dementia. -This is probably secondary to metabolic causes including dehydration. -Family mentioned that he is disoriented at home.   COPD with emphysema -Stable continue home medications currently does not appear to be exacerbation.  Coronary artery diseases - stable continue home medications  Dementia with behavioral disturbance  -Anticipate some sundowning responded well to Haldol will continue for now On a transmitted.  Systolic CHF, chronic (HCC) - chronic, no signs of fluid overload, continue home medications and follow clinically.  Hypertension - hold home medications given history of tension initially restart when able  Second degree AV block, Mobitz type II  -Stable status post pacemaker  Dehydration -With mild AKI admission, baseline creatinine 1.1 started on IV fluids, improved.  Debility  -PT/OT evaluate the patient, but his wife declined the home health PT. -I have discussed with his daughter [name], prior to discharge.  Discharge Instructions  Discharge Instructions    Diet - low sodium heart healthy    Complete by:  As directed      Increase activity slowly    Complete by:  As directed             Medication List    TAKE these medications        albuterol 108 (90 Base) MCG/ACT inhaler  Commonly known as:  PROVENTIL HFA;VENTOLIN HFA  Inhale 2 puffs into the lungs every 4 (four) hours as needed. For shortness of breath     aspirin 325 MG tablet  Take 162.5 mg by mouth daily.     budesonide-formoterol  160-4.5 MCG/ACT inhaler  Commonly known as:  SYMBICORT  Inhale 2 puffs into the lungs 2 (two) times daily.     CENTRUM SILVER PO  Take 1 tablet by mouth every morning.     FISH OIL PO  Take 2 capsules by mouth daily.     fluticasone furoate-vilanterol 100-25 MCG/INH Aepb  Commonly known as:  BREO ELLIPTA  Inhale 1 puff into the lungs daily.     folic acid 1 MG tablet  Commonly known as:  FOLVITE  Take 1 mg by mouth daily.     hydrochlorothiazide 25 MG tablet  Commonly known as:  HYDRODIURIL  Take 12.5 mg by mouth every morning.     losartan 50 MG tablet  Commonly known as:  COZAAR  Take 50 mg by mouth every morning.     MAGNESIUM SULFATE PO  Take 1 tablet by mouth daily.     metoprolol 50 MG tablet  Commonly known as:  LOPRESSOR  Take 1 tablet (50 mg total) by mouth 2 (two) times daily.     NAMENDA XR 28 MG Cp24 24 hr capsule  Generic drug:  memantine  Take 28 mg by mouth daily.     NITROSTAT 0.4 MG SL tablet  Generic drug:  nitroGLYCERIN  Place 0.4 mg under the tongue every 5 (five) minutes x 3 doses as needed.  omeprazole 20 MG capsule  Commonly known as:  PRILOSEC  Take 20 mg by mouth 2 (two) times daily.     polyethylene glycol packet  Commonly known as:  MIRALAX / GLYCOLAX  Take 17 g by mouth daily.     simvastatin 20 MG tablet  Commonly known as:  ZOCOR  Take 20 mg by mouth every evening.     tiotropium 18 MCG inhalation capsule  Commonly known as:  SPIRIVA  Place 18 mcg into inhaler and inhale daily.           Follow-up Information    Follow up with Todd Shire, MD. Go on 04/05/2016.   Specialty:  Family Medicine   Why:  @1 :40pm   Contact information:   [address] [place] [address] [phone]      Allergies  Allergen Reactions  . Doxycycline Other (See Comments)    Unknown- patient doesn't remember  . Penicillins Rash    Consultations:  None   Procedures/Studies: Dg Chest 2 View  03/26/2016   CLINICAL DATA:  Dementia, shortness of breath, coronary disease, diabetes mellitus, hypertension EXAM: CHEST  2 VIEW COMPARISON:  06/14/2015 FINDINGS: LEFT subclavian transvenous pacemaker leads project at RIGHT atrium and RIGHT ventricle. Enlargement of cardiac silhouette with slight vascular congestion. Atherosclerotic calcification aorta. Motion artifacts degrade lateral view. Emphysematous changes without infiltrate, pleural effusion, or pneumothorax. Bones demineralized. IMPRESSION: Enlargement of cardiac silhouette with pulmonary vascular congestion post pacemaker. COPD changes without acute infiltrate. Aortic atherosclerosis. Electronically Signed   By: Lavonia Dana M.D.   On: 03/26/2016 15:15   Ct Head Wo Contrast  03/26/2016  CLINICAL DATA:  Altered mental status, difficulty with ambulation. EXAM: CT HEAD WITHOUT CONTRAST TECHNIQUE: Contiguous axial images were obtained from the base of the skull through the vertex without intravenous contrast. COMPARISON:  Head CT 06/14/2015 FINDINGS: Brain: Stable atrophy and chronic small vessel ischemia. No intracranial hemorrhage, mass effect, or midline shift. Chronic ischemia versus remote lacunar infarct in the pons, unchanged. No hydrocephalus. The basilar cisterns are patent. No evidence of territorial infarct. No intracranial fluid collection. Vascular: No hyperdense vessel or abnormal calcification. Skull:  Calvarium is intact. Sinuses/Orbits: Included paranasal sinuses and mastoid air cells are well aerated. Other: None. IMPRESSION: Stable atrophy and chronic small vessel ischemia. No acute intracranial abnormality. Electronically Signed   By: Jeb Levering M.D.   On: 03/26/2016 18:13   Ct Abdomen Pelvis W Contrast  03/26/2016  CLINICAL DATA:  Dementia.  Decreased appetite. EXAM: CT ABDOMEN AND PELVIS WITH CONTRAST TECHNIQUE: Multidetector CT imaging of the abdomen and pelvis was performed using the standard protocol following bolus administration of  intravenous contrast. CONTRAST:  75 mL Isovue-300 IV. COMPARISON:  05/28/2014 and 04/19/2011 FINDINGS: Lower chest: Lung bases demonstrate minimal scarring. Cardiac pacer leads are present. There is a small amount of pericardial fluid unchanged. Sub cm right pericardial phrenic lymph node unchanged. Hepatobiliary: Previous cholecystectomy. Biliary stent in place new since the prior exam. Prominence of the common bile duct measuring 1.7 cm slightly worse. Minimal air within the common duct and left intrahepatic biliary tree unchanged. No focal liver mass. Pancreas: No mass, inflammatory changes, or other significant abnormality. Spleen: Within normal limits in size and appearance. Adrenals/Urinary Tract: Adrenal glands are normal. Kidneys are normal in size with a 1.3 cm cyst over the mid pole. Couple sub cm left renal cortical hypodensities too small to characterize but likely cysts. No hydronephrosis or nephrolithiasis. Ureters are normal in caliber  and otherwise unremarkable. Bladder is normal. Stomach/Bowel: Stomach and small bowel are within normal. Appendix is normal. There is diverticulosis of the colon. Vascular/Lymphatic: No pathologically enlarged lymph nodes. Moderate calcified plaque over the abdominal aorta. Stable 3.2 cm aneurysm 8 dilatation of the distal abdominal aorta. Reproductive: No mass or other significant abnormality. Other: Left inguinal hernia containing only peritoneal fat. Musculoskeletal:  Degenerative change of the spine and hips. IMPRESSION: No acute findings in the abdomen/pelvis. Dilatation of the common bile duct measuring 1.7 cm with biliary stent in place. Mild pneumobilia unchanged. 1.3 cm right renal cyst. Sub cm left renal cortical hypodensities too small to characterize but likely cysts. Aortic atherosclerosis. Stable 3.2 cm aneurysm of the distal abdominal aorta. Diverticulosis of the colon. Small pericardial effusion unchanged. Left inguinal hernia containing only peritoneal  fat. Electronically Signed   By: Marin Olp M.D.   On: 03/26/2016 18:18   US Abdomen Limited Ruq  03/29/2016  CLINICAL DATA:  On CT scan of November 25, 2015 at dilated common bile duct was observed. A did not biliary stent was present as well as pneumobilia. EXAM: US ABDOMEN LIMITED - RIGHT UPPER QUADRANT COMPARISON:  Abdominal CT scan of March 26, 2016 FINDINGS: Gallbladder: The gallbladder is surgery surgically absent. Common bile duct: Diameter: The common bile duct is dilated to approximately 1.7 cm. An echogenic stent is visible within the inferior aspect of the common bile duct. Liver: The hepatic echotexture is heterogeneous. Some pneumobilia in the left hepatic lobe is observed. IMPRESSION: The ultrasonic findings confirm the CT findings. The common bile duct is dilated to 1.7 cm. A stent is in place. There is pneumobilia in the left aspect of the intrahepatic biliary tree. Electronically Signed   By: David  Martinique M.D.   On: 03/29/2016 09:40    (Echo, Carotid, EGD, Colonoscopy, ERCP)    Subjective:   Discharge Exam: Filed Vitals:   03/30/16 0640 03/30/16 1133  BP: 157/98 139/87  Pulse: 98 98  Temp: 97.8 F (36.6 C) 97.8 F (36.6 C)  Resp:  22   Filed Vitals:   03/30/16 0420 03/30/16 0640 03/30/16 0815 03/30/16 1133  BP:  157/98  139/87  Pulse:  98  98  Temp:  97.8 F (36.6 C)  97.8 F (36.6 C)  TempSrc:  Oral  Oral  Resp:    22  Height:      Weight: 84.687 kg (186 lb 11.2 oz)     SpO2:  95% 93% 96%    General: Pt is alert, awake, not in acute distress Cardiovascular: RRR, S1/S2 +, no rubs, no gallops Respiratory: CTA bilaterally, no wheezing, no rhonchi Abdominal: Soft, NT, ND, bowel sounds + Extremities: no edema, no cyanosis   The results of significant diagnostics from this hospitalization (including imaging, microbiology, ancillary and laboratory) are listed below for reference.     Microbiology: Recent Results (from the past 240 hour(s))  Culture, blood  (Routine x 2)     Status: None (Preliminary result)   Collection Time: 03/26/16  2:40 PM  Result Value Ref Range Status   Specimen Description BLOOD RIGHT ANTECUBITAL  Final   Special Requests BOTTLES DRAWN AEROBIC AND ANAEROBIC 5CC EACH  Final   Culture   Final    NO GROWTH 3 DAYS Performed at [facility]    Report Status PENDING  Incomplete  Culture, blood (Routine x 2)     Status: None (Preliminary result)   Collection Time: 03/26/16  2:50 PM  Result Value  Ref Range Status   Specimen Description BLOOD LEFT ANTECUBITAL  Final   Special Requests BOTTLES DRAWN AEROBIC AND ANAEROBIC 5CC EACH  Final   Culture   Final    NO GROWTH 3 DAYS Performed at [facility]    Report Status PENDING  Incomplete  Urine culture     Status: None   Collection Time: 03/26/16  3:15 PM  Result Value Ref Range Status   Specimen Description URINE, CLEAN CATCH  Final   Special Requests NONE  Final   Culture NO GROWTH Performed at [facility]   Final   Report Status 03/27/2016 FINAL  Final     Labs: BNP (last 3 results) No results for input(s): BNP in the last 8760 hours. Basic Metabolic Panel:  Recent Labs Lab 03/26/16 1430 03/27/16 0734 03/28/16 0224 03/29/16 0218 03/30/16 0354  NA 136 139 139 139 139  K 4.0 3.5 3.5 4.0 4.2  CL 99* 107 107 111 106  CO2 24 23 24 23 23   GLUCOSE 248* 189* 172* 159* 171*  BUN 17 12 15 14 9   CREATININE 1.42* 1.09 1.14 1.06 1.22  CALCIUM 9.0 8.5* 8.6* 8.7* 9.0  MG  --  1.7  --   --   --   PHOS  --  2.8  --   --   --    Liver Function Tests:  Recent Labs Lab 03/26/16 1430 03/27/16 0734 03/30/16 0354  AST 136* 80* 48*  ALT 77* 60 40  ALKPHOS 152* 116 131*  BILITOT 1.7* 1.0 0.7  PROT 6.9 5.3* 5.8*  ALBUMIN 3.3* 2.5* 3.0*    Recent Labs Lab 03/26/16 1430  LIPASE 14   No results for input(s): AMMONIA in the last 168 hours. CBC:  Recent Labs Lab 03/26/16 1430 03/27/16 0734 03/28/16 0224 03/29/16 0218 03/30/16 0354   WBC 12.7* 6.8 6.2 6.1 9.9  NEUTROABS 10.3*  --   --   --   --   HGB 14.5 12.4* 11.9* 11.9* 13.3  HCT 42.6 37.9* 37.8* 37.6* 40.2  MCV 95.5 97.7 97.7 97.4 96.2  PLT 305 210 223 234 294   Cardiac Enzymes:  Recent Labs Lab 03/26/16 1430  TROPONINI 0.03*   BNP: Invalid input(s): POCBNP CBG:  Recent Labs Lab 03/29/16 0608 03/29/16 1112 03/29/16 1624 03/29/16 2234 03/30/16 0645  GLUCAP 169* 234* 130* 177* 166*   D-Dimer No results for input(s): DDIMER in the last 72 hours. Hgb A1c No results for input(s): HGBA1C in the last 72 hours. Lipid Profile No results for input(s): CHOL, HDL, LDLCALC, TRIG, CHOLHDL, LDLDIRECT in the last 72 hours. Thyroid function studies No results for input(s): TSH, T4TOTAL, T3FREE, THYROIDAB in the last 72 hours.  Invalid input(s): FREET3 Anemia work up No results for input(s): VITAMINB12, FOLATE, FERRITIN, TIBC, IRON, RETICCTPCT in the last 72 hours. Urinalysis    Component Value Date/Time   COLORURINE AMBER* 03/26/2016 1515   APPEARANCEUR CLEAR 03/26/2016 1515   LABSPEC 1.020 03/26/2016 1515   PHURINE 7.0 03/26/2016 1515   GLUCOSEU NEGATIVE 03/26/2016 1515   HGBUR NEGATIVE 03/26/2016 1515   BILIRUBINUR SMALL* 03/26/2016 1515   KETONESUR NEGATIVE 03/26/2016 1515   PROTEINUR NEGATIVE 03/26/2016 1515   UROBILINOGEN 2.0* 06/14/2015 0840   NITRITE NEGATIVE 03/26/2016 1515   LEUKOCYTESUR NEGATIVE 03/26/2016 1515   Sepsis Labs Invalid input(s): PROCALCITONIN,  WBC,  LACTICIDVEN Microbiology Recent Results (from the past 240 hour(s))  Culture, blood (Routine x 2)     Status: None (Preliminary result)  Collection Time: 03/26/16  2:40 PM  Result Value Ref Range Status   Specimen Description BLOOD RIGHT ANTECUBITAL  Final   Special Requests BOTTLES DRAWN AEROBIC AND ANAEROBIC 5CC EACH  Final   Culture   Final    NO GROWTH 3 DAYS Performed at [facility]    Report Status PENDING  Incomplete  Culture, blood (Routine x 2)      Status: None (Preliminary result)   Collection Time: 03/26/16  2:50 PM  Result Value Ref Range Status   Specimen Description BLOOD LEFT ANTECUBITAL  Final   Special Requests BOTTLES DRAWN AEROBIC AND ANAEROBIC 5CC EACH  Final   Culture   Final    NO GROWTH 3 DAYS Performed at [facility]    Report Status PENDING  Incomplete  Urine culture     Status: None   Collection Time: 03/26/16  3:15 PM  Result Value Ref Range Status   Specimen Description URINE, CLEAN CATCH  Final   Special Requests NONE  Final   Culture NO GROWTH Performed at [facility]   Final   Report Status 03/27/2016 FINAL  Final     Time coordinating discharge: Over 30 minutes  SIGNED:   Birdie Hopes, MD  [facility] 03/30/2016, 11:39 AM Pager   If 7PM-7AM, please contact night-coverage [email or web address] Password [email or web address]

## 2016-03-30 NOTE — Care Management Note (Signed)
Case Management Note  Patient Details  Name: PIUS BULSON MRN: [id number] Date of Birth: Sep 26, 1926  Subjective/Objective:    Admitted with Sepsis                Action/Plan: Patient lives at home with spouse/ CM talked to spouse at the bedside, pt confused at present; spouse stated that she cares for the pt at home with help from her son in law and neighbors. She does not want any HHC at this time; No DME; PCP is Dr Malachi Carl; private insurance with [employer] with prescription drug coverage; pharmacy of choice is [facility]; spouse reports no problem getting medication. No needs identified at this time.  Expected Discharge Date:    03/30/2016              Expected Discharge Plan:  Home/Self Care  Discharge planning Services  CM Consult   Choice offered to:  Patient   Status of Service:  Completed, signed off  Sherrilyn Rist [id number] 03/30/2016, 10:05 AM

## 2016-03-31 LAB — CULTURE, BLOOD (ROUTINE X 2)
CULTURE: NO GROWTH
Culture: NO GROWTH

## 2016-04-30 ENCOUNTER — Ambulatory Visit (INDEPENDENT_AMBULATORY_CARE_PROVIDER_SITE_OTHER): Payer: Medicare HMO | Admitting: *Deleted

## 2016-04-30 DIAGNOSIS — I441 Atrioventricular block, second degree: Secondary | ICD-10-CM

## 2016-04-30 NOTE — Progress Notes (Signed)
Remote pacemaker transmission.   

## 2016-05-02 ENCOUNTER — Encounter: Payer: Self-pay | Admitting: Cardiology

## 2016-05-03 LAB — CUP PACEART REMOTE DEVICE CHECK
Battery Voltage: 2.93 V
Brady Statistic AP VP Percent: 1 %
Brady Statistic AS VP Percent: 1 %
Brady Statistic AS VS Percent: 98 %
Brady Statistic RV Percent Paced: 1 %
Implantable Lead Implant Date: 20120405
Implantable Lead Implant Date: 20120405
Implantable Lead Location: 753860
Implantable Lead Model: 1948
Lead Channel Impedance Value: 530 Ohm
Lead Channel Pacing Threshold Amplitude: 0.75 V
Lead Channel Pacing Threshold Pulse Width: 0.4 ms
Lead Channel Sensing Intrinsic Amplitude: 12 mV
Lead Channel Setting Pacing Amplitude: 1.75 V
Lead Channel Setting Pacing Amplitude: 2.125
Lead Channel Setting Pacing Pulse Width: 0.4 ms
MDC IDC LEAD LOCATION: 753859
MDC IDC MSMT BATTERY REMAINING LONGEVITY: 106 mo
MDC IDC MSMT BATTERY REMAINING PERCENTAGE: 81 %
MDC IDC MSMT LEADCHNL RA IMPEDANCE VALUE: 350 Ohm
MDC IDC MSMT LEADCHNL RA PACING THRESHOLD PULSEWIDTH: 0.4 ms
MDC IDC MSMT LEADCHNL RA SENSING INTR AMPL: 3.5 mV
MDC IDC MSMT LEADCHNL RV PACING THRESHOLD AMPLITUDE: 1.875 V
MDC IDC PG SERIAL: 7228914
MDC IDC SESS DTM: 20170807143952
MDC IDC SET LEADCHNL RV SENSING SENSITIVITY: 4 mV
MDC IDC STAT BRADY AP VS PERCENT: 1.1 %
MDC IDC STAT BRADY RA PERCENT PACED: 1.1 %

## 2016-05-14 ENCOUNTER — Encounter: Payer: Self-pay | Admitting: Cardiology

## 2016-05-14 ENCOUNTER — Ambulatory Visit (INDEPENDENT_AMBULATORY_CARE_PROVIDER_SITE_OTHER): Payer: Medicare HMO | Admitting: Cardiology

## 2016-05-14 VITALS — BP 114/78 | HR 85 | Ht 66.0 in | Wt 190.4 lb

## 2016-05-14 DIAGNOSIS — I10 Essential (primary) hypertension: Secondary | ICD-10-CM | POA: Diagnosis not present

## 2016-05-14 DIAGNOSIS — I2510 Atherosclerotic heart disease of native coronary artery without angina pectoris: Secondary | ICD-10-CM

## 2016-05-14 DIAGNOSIS — I441 Atrioventricular block, second degree: Secondary | ICD-10-CM

## 2016-05-14 DIAGNOSIS — I5022 Chronic systolic (congestive) heart failure: Secondary | ICD-10-CM

## 2016-05-14 DIAGNOSIS — E785 Hyperlipidemia, unspecified: Secondary | ICD-10-CM

## 2016-05-14 NOTE — Patient Instructions (Signed)
Continue your current therapy  I will see you in 6 months.   

## 2016-05-14 NOTE — Progress Notes (Signed)
[name] Date of Birth: Feb 20, 1927   History of Present Illness: [name] is seen for followup of CAD and pacemaker. He is status post acute anterior myocardial infarction in April of 2012 treated with direct stenting of the mid LAD. He also had second-degree heart block with significant bradycardia that was symptomatic and he underwent a permanent pacemaker. Pacemaker follow up has been satisfactory. He was admitted in July 2017 with sepsis (unknown source) and mental status changes. No cardiac issues on that admission. He is seen today in follow up with his wife. He is extremely hard of hearing and has dementia. Wife reports appetite is good and no chest pain. She thinks his breathing is doing well. He has a painful rash on right neck for past 5-6 days.  Current Outpatient Prescriptions on File Prior to Visit  Medication Sig Dispense Refill  . albuterol (PROVENTIL HFA;VENTOLIN HFA) 108 (90 BASE) MCG/ACT inhaler Inhale 2 puffs into the lungs every 4 (four) hours as needed. For shortness of breath    . aspirin 325 MG tablet Take 162.5 mg by mouth daily.     . budesonide-formoterol (SYMBICORT) 160-4.5 MCG/ACT inhaler Inhale 2 puffs into the lungs 2 (two) times daily.    . fluticasone furoate-vilanterol (BREO ELLIPTA) 100-25 MCG/INH AEPB Inhale 1 puff into the lungs daily. 1 each 11  . folic acid (FOLVITE) 1 MG tablet Take 1 mg by mouth daily.    . hydrochlorothiazide (HYDRODIURIL) 25 MG tablet Take 12.5 mg by mouth every morning.     Marland Kitchen losartan (COZAAR) 50 MG tablet Take 50 mg by mouth every morning.    Marland Kitchen MAGNESIUM SULFATE PO Take 1 tablet by mouth daily.    . metoprolol (LOPRESSOR) 50 MG tablet Take 1 tablet (50 mg total) by mouth 2 (two) times daily. 180 tablet 3  . Multiple Vitamins-Minerals (CENTRUM SILVER PO) Take 1 tablet by mouth every morning.     . nitroGLYCERIN (NITROSTAT) 0.4 MG SL tablet Place 0.4 mg under the tongue every 5 (five) minutes x 3 doses as needed.    .  Omega-3 Fatty Acids (FISH OIL PO) Take 2 capsules by mouth daily.    Marland Kitchen omeprazole (PRILOSEC) 20 MG capsule Take 20 mg by mouth 2 (two) times daily.  4  . polyethylene glycol (MIRALAX / GLYCOLAX) packet Take 17 g by mouth daily.    . simvastatin (ZOCOR) 20 MG tablet Take 20 mg by mouth every evening.      No current facility-administered medications on file prior to visit.     Allergies  Allergen Reactions  . Doxycycline Other (See Comments)    Unknown- patient doesn't remember  . Penicillins Rash    Past Medical History:  Diagnosis Date  . Bacteremia due to vancomycin resistant Enterococcus   . Cancer ([place])    precancer skin lesions  . Chronic renal insufficiency   . COPD (chronic obstructive pulmonary disease) with chronic bronchitis ([place])   . Coronary artery disease   . Dementia in Alzheimer's disease with delirium   . Diabetes mellitus   . Duodenal ulcer   . Gallstone pancreatitis    recurrent  . GERD (gastroesophageal reflux disease)   . GI bleed   . Hearing impairment   . Hyperlipidemia   . Hypertension   . Myocardial infarction, anterior wall, subsequent care   . Obesities, morbid ([place])   . Second degree AV block, Mobitz type II    a. s/p STJ dual chamber PPM  .  Sepsis ([place]) 03/2016  . TIA (transient ischemic attack)     Past Surgical History:  Procedure Laterality Date  . BILIARY STENT PLACEMENT N/A 07/09/2014   Procedure: BILIARY STENT PLACEMENT;  Surgeon: Beryle Beams, MD;  Location: [facility] ENDOSCOPY;  Service: Endoscopy;  Laterality: N/A;  . CHOLECYSTECTOMY    . ERCP N/A 05/30/2014   Procedure: ENDOSCOPIC RETROGRADE CHOLANGIOPANCREATOGRAPHY (ERCP);  Surgeon: Ladene Artist, MD;  Location: [facility] ENDOSCOPY;  Service: Endoscopy;  Laterality: N/A;  . ERCP N/A 07/09/2014   Procedure: ENDOSCOPIC RETROGRADE CHOLANGIOPANCREATOGRAPHY (ERCP);  Surgeon: Beryle Beams, MD;  Location: [facility] ENDOSCOPY;  Service: Endoscopy;  Laterality: N/A;  . PACEMAKER PLACEMENT  01/01/11   STJ  dual chamber PPM implanted by Dr Rayann Heman for Mobitz II   . pancreatic stent      History  Smoking Status  . Former Smoker  . Packs/day: 1.00  . Years: 57.00  . Types: Cigarettes  . Quit date: 01/12/1996  Smokeless Tobacco  . Never Used    History  Alcohol Use No    Family History  Problem Relation Age of Onset  . Cerebral aneurysm Mother   . Sudden Cardiac Death Father   . Hypertension Other     Review of Systems: The review of systems is as noted in HPI. He is very hard of hearing.  All other systems were reviewed and are negative.  Physical Exam: BP 114/78   Pulse 85   Ht 5\' 6"  (1.676 m)   Wt 190 lb 6.4 oz (86.4 kg)   BMI 30.73 kg/m  He is an elderly overweight white male who is very hard of hearing. He is in no distress. HEENT exam reveals he is wearing hearing aid. His pupils are equal round and reactive. Sclera are clear. Oropharynx is clear. Neck is without JVD, adenopathy, or bruits. He does have a red, blistering rash on right posterior neck. Lungs are clear with diminished breath sounds throughout, few wheezes. Cardiac exam reveals a regular rate and rhythm with gr 1-2/6 SEM RUSB. His pacemaker site is normal. Abdomen is soft and nontender without masses or hepatosplenomegaly. Femoral and pedal pulses are 2+ and symmetric. He has no edema. Neurologic exam is nonfocal.  LABORATORY DATA:  Pacemaker check in early August was satisfactory.  Lab Results  Component Value Date   WBC 9.9 03/30/2016   HGB 13.3 03/30/2016   HCT 40.2 03/30/2016   PLT 294 03/30/2016   GLUCOSE 171 (H) 03/30/2016   CHOL 212 (H) 03/12/2011   TRIG 115.0 03/12/2011   HDL 65.40 03/12/2011   LDLDIRECT 126.1 03/12/2011   LDLCALC  01/19/2011    91        Total Cholesterol/HDL:CHD Risk Coronary Heart Disease Risk Table                     Men   Women  1/2 Average Risk   3.4   3.3  Average Risk       5.0   4.4  2 X Average Risk   9.6   7.1  3 X Average Risk  23.4   11.0        Use the  calculated Patient Ratio above and the CHD Risk Table to determine the patient's CHD Risk.        ATP III CLASSIFICATION (LDL):  <100     mg/dL   Optimal  100-129  mg/dL   Near or Above  Optimal  130-159  mg/dL   Borderline  160-189  mg/dL   High  >190     mg/dL   Very High   ALT 40 03/30/2016   AST 48 (H) 03/30/2016   NA 139 03/30/2016   K 4.2 03/30/2016   CL 106 03/30/2016   CREATININE 1.22 03/30/2016   BUN 9 03/30/2016   CO2 23 03/30/2016   TSH 4.325 03/27/2016   INR 1.14 05/23/2011   HGBA1C 7.9 (H) 03/27/2016     Assessment / Plan: 1. Coronary disease status post anterior myocardial infarction in 2012 treated with stenting of the LAD. He is asymptomatic. We will continue with aspirin, metoprolol, and statin therapy.   2. Severe COPD. Clinically stable.  3. Morbid obesity.  4. AV block s/p pacemaker implant. Continue followup in pacemaker clinic.   5. Hyperlipidemia. Continue statin therapy.  6. Chronic diastolic congestive heart failure. He appears to be euvolemic on his current medical therapy.  7. Zoster rash right neck. Too late for antiviral therapy.   I will follow up in 6 months.

## 2016-05-31 ENCOUNTER — Emergency Department (HOSPITAL_COMMUNITY): Payer: Medicare HMO

## 2016-05-31 ENCOUNTER — Emergency Department (HOSPITAL_COMMUNITY)
Admission: EM | Admit: 2016-05-31 | Discharge: 2016-05-31 | Disposition: A | Discharge status: Home / Self Care | Payer: Medicare HMO | Attending: Emergency Medicine | Admitting: Emergency Medicine

## 2016-05-31 DIAGNOSIS — E119 Type 2 diabetes mellitus without complications: Secondary | ICD-10-CM | POA: Insufficient documentation

## 2016-05-31 DIAGNOSIS — F0390 Unspecified dementia without behavioral disturbance: Secondary | ICD-10-CM

## 2016-05-31 DIAGNOSIS — Y999 Unspecified external cause status: Secondary | ICD-10-CM | POA: Diagnosis not present

## 2016-05-31 DIAGNOSIS — Z87891 Personal history of nicotine dependence: Secondary | ICD-10-CM | POA: Insufficient documentation

## 2016-05-31 DIAGNOSIS — S5001XA Contusion of right elbow, initial encounter: Secondary | ICD-10-CM | POA: Diagnosis not present

## 2016-05-31 DIAGNOSIS — J449 Chronic obstructive pulmonary disease, unspecified: Secondary | ICD-10-CM | POA: Insufficient documentation

## 2016-05-31 DIAGNOSIS — Y939 Activity, unspecified: Secondary | ICD-10-CM | POA: Insufficient documentation

## 2016-05-31 DIAGNOSIS — Z955 Presence of coronary angioplasty implant and graft: Secondary | ICD-10-CM | POA: Diagnosis not present

## 2016-05-31 DIAGNOSIS — Z79899 Other long term (current) drug therapy: Secondary | ICD-10-CM | POA: Diagnosis not present

## 2016-05-31 DIAGNOSIS — Z8673 Personal history of transient ischemic attack (TIA), and cerebral infarction without residual deficits: Secondary | ICD-10-CM | POA: Insufficient documentation

## 2016-05-31 DIAGNOSIS — Z85828 Personal history of other malignant neoplasm of skin: Secondary | ICD-10-CM | POA: Diagnosis not present

## 2016-05-31 DIAGNOSIS — S59901A Unspecified injury of right elbow, initial encounter: Secondary | ICD-10-CM | POA: Diagnosis present

## 2016-05-31 DIAGNOSIS — I5022 Chronic systolic (congestive) heart failure: Secondary | ICD-10-CM | POA: Insufficient documentation

## 2016-05-31 DIAGNOSIS — I2510 Atherosclerotic heart disease of native coronary artery without angina pectoris: Secondary | ICD-10-CM | POA: Insufficient documentation

## 2016-05-31 DIAGNOSIS — N182 Chronic kidney disease, stage 2 (mild): Secondary | ICD-10-CM | POA: Insufficient documentation

## 2016-05-31 DIAGNOSIS — W19XXXA Unspecified fall, initial encounter: Secondary | ICD-10-CM | POA: Insufficient documentation

## 2016-05-31 DIAGNOSIS — Z7982 Long term (current) use of aspirin: Secondary | ICD-10-CM | POA: Insufficient documentation

## 2016-05-31 DIAGNOSIS — I130 Hypertensive heart and chronic kidney disease with heart failure and stage 1 through stage 4 chronic kidney disease, or unspecified chronic kidney disease: Secondary | ICD-10-CM | POA: Diagnosis not present

## 2016-05-31 DIAGNOSIS — Y92009 Unspecified place in unspecified non-institutional (private) residence as the place of occurrence of the external cause: Secondary | ICD-10-CM | POA: Diagnosis not present

## 2016-05-31 DIAGNOSIS — M25521 Pain in right elbow: Secondary | ICD-10-CM

## 2016-05-31 DIAGNOSIS — Z950 Presence of cardiac pacemaker: Secondary | ICD-10-CM | POA: Insufficient documentation

## 2016-05-31 DIAGNOSIS — I252 Old myocardial infarction: Secondary | ICD-10-CM | POA: Diagnosis not present

## 2016-05-31 NOTE — ED Provider Notes (Signed)
[facility] DEPT Provider Note   CSN: [id number] Arrival date & time: 05/31/16  1029     History   Chief Complaint Chief Complaint  Patient presents with  . Fall  . Weakness    HPI JARRET BRINN is a 82 y.o. male.  Patient brought in by his wife. She is his caretaker. He has a history of significant dementia. Patient's wife says that his dementia is baseline. He does have a history of some falls. Was found sitting on the floor today. Complaining of right elbow pain but that also was complaining of elbow pain yesterday. His wife is concerned he injured his elbow.      Past Medical History:  Diagnosis Date  . Bacteremia due to vancomycin resistant Enterococcus   . Cancer ([place])    precancer skin lesions  . Chronic renal insufficiency   . COPD (chronic obstructive pulmonary disease) with chronic bronchitis ([place])   . Coronary artery disease   . Dementia in Alzheimer's disease with delirium   . Diabetes mellitus   . Duodenal ulcer   . Gallstone pancreatitis    recurrent  . GERD (gastroesophageal reflux disease)   . GI bleed   . Hearing impairment   . Hyperlipidemia   . Hypertension   . Myocardial infarction, anterior wall, subsequent care   . Obesities, morbid ([place])   . Second degree AV block, Mobitz type II    a. s/p STJ dual chamber PPM  . Sepsis ([place]) 03/2016  . TIA (transient ischemic attack)     Patient Active Problem List   Diagnosis Date Noted  . Acute encephalopathy 03/27/2016  . Dehydration 03/27/2016  . SIRS (systemic inflammatory response syndrome) ([place]) 03/26/2016  . Acute cholangitis 05/30/2014  . Nonspecific abnormal results of liver function study 05/30/2014  . Nonspecific (abnormal) findings on radiological and other examination of biliary tract 05/30/2014  . Sepsis ([place]) 05/28/2014  . Other persistent mental disorders due to conditions classified elsewhere 02/03/2013  . Memory loss 02/03/2013  . Dementia with behavioral disturbance  11/01/2012  . Chronic renal insufficiency, stage II (mild) 11/01/2012  . Pacemaker-St.Jude 07/08/2012  . COPD with emphysema ([place]) 06/24/2012  . Leukocytosis 06/23/2012  . Systolic CHF, chronic ([place]) 07/11/2011  . 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 Second degree AV block, Mobitz type II   . Chest discomfort   . Coronary artery disease   . DM2 (diabetes mellitus, type 2) ([place])   . Hypertension   . Hyperlipidemia   . Obesities, morbid ([place])   . Gallstone pancreatitis   . TIA (transient ischemic attack)   . Bacteremia due to vancomycin resistant Enterococcus   . GERD (gastroesophageal reflux disease)   . Hearing impairment     Past Surgical History:  Procedure Laterality Date  . BILIARY STENT PLACEMENT N/A 07/09/2014   Procedure: BILIARY STENT PLACEMENT;  Surgeon: Beryle Beams, MD;  Location: [facility] ENDOSCOPY;  Service: Endoscopy;  Laterality: N/A;  . CHOLECYSTECTOMY    . ERCP N/A 05/30/2014   Procedure: ENDOSCOPIC RETROGRADE CHOLANGIOPANCREATOGRAPHY (ERCP);  Surgeon: Ladene Artist, MD;  Location: [facility] ENDOSCOPY;  Service: Endoscopy;  Laterality: N/A;  . ERCP N/A 07/09/2014   Procedure: ENDOSCOPIC RETROGRADE CHOLANGIOPANCREATOGRAPHY (ERCP);  Surgeon: Beryle Beams, MD;  Location: [facility] ENDOSCOPY;  Service: Endoscopy;  Laterality: N/A;  . PACEMAKER PLACEMENT  01/01/11   STJ dual chamber PPM implanted by Dr Rayann Heman for Mobitz II   . pancreatic stent      OB History  No data available       Home Medications    Prior to Admission medications   Medication Sig Start Date End Date Taking? Authorizing Provider  albuterol (PROVENTIL HFA;VENTOLIN HFA) 108 (90 BASE) MCG/ACT inhaler Inhale 2 puffs into the lungs every 4 (four) hours as needed. For shortness of breath   Yes Historical Provider, MD  aspirin 325 MG tablet Take 162.5 mg by mouth daily.    Yes Historical Provider, MD  budesonide-formoterol (SYMBICORT) 160-4.5 MCG/ACT inhaler Inhale 2 puffs into the lungs 2 (two) times  daily.   Yes Historical Provider, MD  fluticasone furoate-vilanterol (BREO ELLIPTA) 100-25 MCG/INH AEPB Inhale 1 puff into the lungs daily. 12/21/15  Yes Chesley Mires, MD  folic acid (FOLVITE) 1 MG tablet Take 1 mg by mouth daily.   Yes Historical Provider, MD  hydrochlorothiazide (HYDRODIURIL) 25 MG tablet Take 12.5 mg by mouth every morning.    Yes Historical Provider, MD  losartan (COZAAR) 50 MG tablet Take 50 mg by mouth every morning.   Yes Historical Provider, MD  MAGNESIUM SULFATE PO Take 1 tablet by mouth daily.   Yes Historical Provider, MD  metoprolol (LOPRESSOR) 50 MG tablet Take 1 tablet (50 mg total) by mouth 2 (two) times daily. 10/14/15  Yes [name], MD  Multiple Vitamins-Minerals (CENTRUM SILVER PO) Take 1 tablet by mouth every morning.    Yes Historical Provider, MD  Omega-3 Fatty Acids (FISH OIL PO) Take 2 capsules by mouth daily.   Yes Historical Provider, MD  omeprazole (PRILOSEC) 20 MG capsule Take 20 mg by mouth 2 (two) times daily. 03/01/15  Yes Historical Provider, MD  polyethylene glycol (MIRALAX / GLYCOLAX) packet Take 17 g by mouth daily.   Yes Historical Provider, MD  simvastatin (ZOCOR) 20 MG tablet Take 20 mg by mouth every evening.    Yes Historical Provider, MD  nitroGLYCERIN (NITROSTAT) 0.4 MG SL tablet Place 0.4 mg under the tongue every 5 (five) minutes x 3 doses as needed.    Historical Provider, MD    Family History Family History  Problem Relation Age of Onset  . Cerebral aneurysm Mother   . Sudden Cardiac Death Father   . Hypertension Other     Social History Social History  Substance Use Topics  . Smoking status: Former Smoker    Packs/day: 1.00    Years: 57.00    Types: Cigarettes    Quit date: 01/12/1996  . Smokeless tobacco: Never Used  . Alcohol use No     Allergies   Doxycycline and Penicillins   Review of Systems Review of Systems  Unable to perform ROS: Dementia     Physical Exam Updated Vital Signs BP 109/74   Pulse 88    Temp 98 F (36.7 C) (Oral)   Resp 22   SpO2 95%   Physical Exam  Constitutional: He appears well-developed and well-nourished. No distress.  HENT:  Head: Normocephalic and atraumatic.  Eyes: EOM are normal. Pupils are equal, round, and reactive to light.  Neck: Neck supple.  Cardiovascular: Normal rate and regular rhythm.   Pulmonary/Chest: Effort normal and breath sounds normal.  Abdominal: Soft. Bowel sounds are normal. There is no tenderness.  Musculoskeletal:  Bruising and swelling to the lateral aspect of the right elbow. Radial pulses 2+. No pain with movement at the wrist no pain with movement at the shoulder. Some pain with movement at the elbow. No obvious deformity.  Neurological: He is alert.  Confused and not oriented.  Patient moving all 4 extremities. Complains of pain with movement of the right arm. Patient with significant dementia.  Nursing note and vitals reviewed.    ED Treatments / Results  Labs (all labs ordered are listed, but only abnormal results are displayed) Labs Reviewed - No data to display  EKG  EKG Interpretation  Date/Time:  Thursday May 31 2016 10:35:54 EDT Ventricular Rate:  91 PR Interval:    QRS Duration: 101 QT Interval:  391 QTC Calculation: 482 R Axis:   -82 Text Interpretation:  Sinus rhythm Borderline prolonged PR interval Left anterior fascicular block Consider anterior infarct Borderline repolarization abnormality No significant change since last tracing Confirmed by Konor Noren  MD, Shabree Tebbetts ([id number]) on 05/31/2016 11:15:59 AM       Radiology Dg Forearm Right  Result Date: 05/31/2016 CLINICAL DATA:  Initial encounter for Fall, right arm pain, pt unable to communicate EXAM: RIGHT FOREARM - 2 VIEW COMPARISON:  Humerus films, dictated separately. FINDINGS: Degenerative changes about the wrist are suboptimally evaluated. Chondrocalcinosis in the triangular fibrocartilage. Enthesophyte at the triceps insertion. No acute fracture or  dislocation. IMPRESSION: No acute osseous abnormality. Chondrocalcinosis in the triangular fibrocartilage, consistent with calcium pyrophosphate deposition disease. Electronically Signed   By: Abigail Miyamoto M.D.   On: 05/31/2016 12:15   Ct Head Wo Contrast  Result Date: 05/31/2016 CLINICAL DATA:  Unwitnessed fall. Patient found on floor by wife. No obvious injury. EXAM: CT HEAD WITHOUT CONTRAST CT CERVICAL SPINE WITHOUT CONTRAST TECHNIQUE: Multidetector CT imaging of the head and cervical spine was performed following the standard protocol without intravenous contrast. Multiplanar CT image reconstructions of the cervical spine were also generated. COMPARISON:  None. FINDINGS: CT HEAD FINDINGS Brain: Advanced generalized atrophy and diffuse white matter disease is stable. With there are remote lacunar infarcts within the basal ganglia and brainstem. No acute infarct, hemorrhage, or mass lesion is present. The ventricles are proportionate to the degree of atrophy. No significant extra-axial fluid collection is present. Vascular: Atherosclerotic calcifications are again noted within the cavernous internal carotid arteries bilaterally. No hyperdense vessel is present. Skull: The calvarium is intact. No focal lytic or blastic lesions are present. Sinuses/Orbits: The paranasal sinuses and mastoid air cells are clear. The globes and orbits are intact. Other: No significant extracranial soft tissue injury is evident. CT CERVICAL SPINE FINDINGS Alignment: Slight anterolisthesis is present at C7-T1. AP alignment is otherwise anatomic. Skull base and vertebrae: The craniocervical junction is within normal limits. A moderate degenerate changes are present at C1-2 and at the midline occipital joints. There is no significant subluxation. Endplate changes are present throughout the cervical spine. There is a remote inferior endplate fracture at T1. No acute fracture or traumatic subluxation is present. Soft tissues and spinal  canal: Atherosclerotic calcifications are present at the cavernous internal carotid arteries bilaterally. There may be significant stenosis on the left. Additional atherosclerotic calcifications are present at the aortic arch and proximal great vessels. Disc levels: Osseous foraminal narrowing is most prominent on the left at C3-4 and on the right at C5-6. Upper chest: The lung apices are clear. The superior mediastinum is within normal limits. Other: IMPRESSION: 1. No acute intracranial abnormality. 2. Stable advanced atrophy and white matter disease. 3. Multilevel spondylosis in the cervical spine without evidence for acute fracture or traumatic subluxation. Electronically Signed   By: San Morelle M.D.   On: 05/31/2016 12:38   Ct Cervical Spine Wo Contrast  Result Date: 05/31/2016 CLINICAL DATA:  Unwitnessed fall. Patient found on  floor by wife. No obvious injury. EXAM: CT HEAD WITHOUT CONTRAST CT CERVICAL SPINE WITHOUT CONTRAST TECHNIQUE: Multidetector CT imaging of the head and cervical spine was performed following the standard protocol without intravenous contrast. Multiplanar CT image reconstructions of the cervical spine were also generated. COMPARISON:  None. FINDINGS: CT HEAD FINDINGS Brain: Advanced generalized atrophy and diffuse white matter disease is stable. With there are remote lacunar infarcts within the basal ganglia and brainstem. No acute infarct, hemorrhage, or mass lesion is present. The ventricles are proportionate to the degree of atrophy. No significant extra-axial fluid collection is present. Vascular: Atherosclerotic calcifications are again noted within the cavernous internal carotid arteries bilaterally. No hyperdense vessel is present. Skull: The calvarium is intact. No focal lytic or blastic lesions are present. Sinuses/Orbits: The paranasal sinuses and mastoid air cells are clear. The globes and orbits are intact. Other: No significant extracranial soft tissue injury is  evident. CT CERVICAL SPINE FINDINGS Alignment: Slight anterolisthesis is present at C7-T1. AP alignment is otherwise anatomic. Skull base and vertebrae: The craniocervical junction is within normal limits. A moderate degenerate changes are present at C1-2 and at the midline occipital joints. There is no significant subluxation. Endplate changes are present throughout the cervical spine. There is a remote inferior endplate fracture at T1. No acute fracture or traumatic subluxation is present. Soft tissues and spinal canal: Atherosclerotic calcifications are present at the cavernous internal carotid arteries bilaterally. There may be significant stenosis on the left. Additional atherosclerotic calcifications are present at the aortic arch and proximal great vessels. Disc levels: Osseous foraminal narrowing is most prominent on the left at C3-4 and on the right at C5-6. Upper chest: The lung apices are clear. The superior mediastinum is within normal limits. Other: IMPRESSION: 1. No acute intracranial abnormality. 2. Stable advanced atrophy and white matter disease. 3. Multilevel spondylosis in the cervical spine without evidence for acute fracture or traumatic subluxation. Electronically Signed   By: San Morelle M.D.   On: 05/31/2016 12:38   Dg Humerus Right  Result Date: 05/31/2016 CLINICAL DATA:  Fall. EXAM: RIGHT HUMERUS - 2+ VIEW COMPARISON:  05/31/2016 FINDINGS: There is no evidence of fracture or other focal bone lesions. Soft tissues are unremarkable. AC joint osteoarthritis is identified. IMPRESSION: 1. No acute findings. Electronically Signed   By: Kerby Moors M.D.   On: 05/31/2016 12:11    Procedures Procedures (including critical care time)  Medications Ordered in ED Medications - No data to display   Initial Impression / Assessment and Plan / ED Course  I have reviewed the triage vital signs and the nursing notes.  Pertinent labs & imaging results that were available during my  care of the patient were reviewed by me and considered in my medical decision making (see chart for details).  Clinical Course    The patient lives at home with his wife. Patient has a history of dementia. Patient's had some falls. Patient was found sitting on the floor fall was not witnessed by his wife. Patient with complaint of right elbow pain.  Patient's mental status is baseline.  CT head neck without any acute findings. X-rays of the humerus and the forearm without any bony injuries. Patient does have some bruising on the lateral aspect of the elbow. Will treat with a sling and have him follow-up with his primary care doctor. Patient's wife is okay with the plan.  Final Clinical Impressions(s) / ED Diagnoses   Final diagnoses:  Fall, initial encounter  Elbow pain,  right  Dementia, without behavioral disturbance    New Prescriptions New Prescriptions   No medications on file  Doesn't will report is   Fredia Sorrow, MD 05/31/16 1346

## 2016-05-31 NOTE — Discharge Instructions (Signed)
Wear the sling on the right arm as needed for comfort. Make an appointment to follow-up with his primary care doctor. Return for any new or worse symptoms.

## 2016-05-31 NOTE — ED Notes (Signed)
Patient placed on monitor, continuous pulse oximetry and blood pressure cuff 

## 2016-05-31 NOTE — ED Triage Notes (Signed)
Pt to ED via [facility] from home -- wife states found him sitting on the floor this morning. He normally gets up and gets dressed himself-- does use urinal at night. Pt has Alzheimer's -- disoriented x 4-- normal for pt.

## 2016-07-30 ENCOUNTER — Ambulatory Visit (INDEPENDENT_AMBULATORY_CARE_PROVIDER_SITE_OTHER): Payer: Medicare HMO | Admitting: *Deleted

## 2016-07-30 DIAGNOSIS — I441 Atrioventricular block, second degree: Secondary | ICD-10-CM | POA: Diagnosis not present

## 2016-07-31 NOTE — Progress Notes (Signed)
Remote pacemaker transmission.   

## 2016-08-01 ENCOUNTER — Encounter: Payer: Self-pay | Admitting: Cardiology

## 2016-08-12 LAB — CUP PACEART REMOTE DEVICE CHECK
Battery Remaining Longevity: 104 mo
Battery Remaining Percentage: 81 %
Brady Statistic AP VS Percent: 1 %
Brady Statistic AS VP Percent: 1 %
Brady Statistic AS VS Percent: 98 %
Implantable Lead Implant Date: 20120405
Implantable Lead Location: 753860
Implantable Lead Model: 1948
Lead Channel Impedance Value: 360 Ohm
Lead Channel Pacing Threshold Amplitude: 1.625 V
Lead Channel Pacing Threshold Pulse Width: 0.4 ms
Lead Channel Sensing Intrinsic Amplitude: 12 mV
Lead Channel Sensing Intrinsic Amplitude: 4 mV
Lead Channel Setting Pacing Amplitude: 1.75 V
Lead Channel Setting Pacing Amplitude: 2.375
Lead Channel Setting Pacing Pulse Width: 0.4 ms
MDC IDC LEAD IMPLANT DT: 20120405
MDC IDC LEAD LOCATION: 753859
MDC IDC MSMT BATTERY VOLTAGE: 2.93 V
MDC IDC MSMT LEADCHNL RA PACING THRESHOLD AMPLITUDE: 0.75 V
MDC IDC MSMT LEADCHNL RA PACING THRESHOLD PULSEWIDTH: 0.4 ms
MDC IDC MSMT LEADCHNL RV IMPEDANCE VALUE: 460 Ohm
MDC IDC PG IMPLANT DT: 20120405
MDC IDC PG SERIAL: 7228914
MDC IDC SESS DTM: 20171106121911
MDC IDC SET LEADCHNL RV SENSING SENSITIVITY: 4 mV
MDC IDC STAT BRADY AP VP PERCENT: 1 %
MDC IDC STAT BRADY RA PERCENT PACED: 1 %
MDC IDC STAT BRADY RV PERCENT PACED: 1 %

## 2016-09-20 ENCOUNTER — Inpatient Hospital Stay (HOSPITAL_COMMUNITY)
Admission: EM | Admit: 2016-09-20 | Discharge: 2016-09-30 | DRG: 871 | Disposition: A | Discharge status: Hospice | Payer: Medicare HMO | Attending: Family Medicine | Admitting: Family Medicine

## 2016-09-20 ENCOUNTER — Encounter (HOSPITAL_COMMUNITY): Payer: Self-pay | Admitting: Emergency Medicine

## 2016-09-20 ENCOUNTER — Emergency Department (HOSPITAL_COMMUNITY): Payer: Medicare HMO

## 2016-09-20 DIAGNOSIS — I441 Atrioventricular block, second degree: Secondary | ICD-10-CM | POA: Diagnosis not present

## 2016-09-20 DIAGNOSIS — K219 Gastro-esophageal reflux disease without esophagitis: Secondary | ICD-10-CM | POA: Diagnosis present

## 2016-09-20 DIAGNOSIS — I714 Abdominal aortic aneurysm, without rupture: Secondary | ICD-10-CM | POA: Diagnosis present

## 2016-09-20 DIAGNOSIS — A419 Sepsis, unspecified organism: Secondary | ICD-10-CM | POA: Diagnosis not present

## 2016-09-20 DIAGNOSIS — R531 Weakness: Secondary | ICD-10-CM | POA: Diagnosis present

## 2016-09-20 DIAGNOSIS — I2510 Atherosclerotic heart disease of native coronary artery without angina pectoris: Secondary | ICD-10-CM | POA: Diagnosis present

## 2016-09-20 DIAGNOSIS — F02818 Dementia in other diseases classified elsewhere, unspecified severity, with other behavioral disturbance: Secondary | ICD-10-CM

## 2016-09-20 DIAGNOSIS — R471 Dysarthria and anarthria: Secondary | ICD-10-CM | POA: Diagnosis present

## 2016-09-20 DIAGNOSIS — Z8673 Personal history of transient ischemic attack (TIA), and cerebral infarction without residual deficits: Secondary | ICD-10-CM | POA: Diagnosis not present

## 2016-09-20 DIAGNOSIS — Z79899 Other long term (current) drug therapy: Secondary | ICD-10-CM

## 2016-09-20 DIAGNOSIS — E1165 Type 2 diabetes mellitus with hyperglycemia: Secondary | ICD-10-CM | POA: Diagnosis present

## 2016-09-20 DIAGNOSIS — I5042 Chronic combined systolic (congestive) and diastolic (congestive) heart failure: Secondary | ICD-10-CM | POA: Diagnosis present

## 2016-09-20 DIAGNOSIS — E785 Hyperlipidemia, unspecified: Secondary | ICD-10-CM | POA: Diagnosis present

## 2016-09-20 DIAGNOSIS — I5022 Chronic systolic (congestive) heart failure: Secondary | ICD-10-CM | POA: Diagnosis present

## 2016-09-20 DIAGNOSIS — Z515 Encounter for palliative care: Secondary | ICD-10-CM | POA: Diagnosis not present

## 2016-09-20 DIAGNOSIS — IMO0002 Reserved for concepts with insufficient information to code with codable children: Secondary | ICD-10-CM

## 2016-09-20 DIAGNOSIS — G9340 Encephalopathy, unspecified: Secondary | ICD-10-CM | POA: Diagnosis present

## 2016-09-20 DIAGNOSIS — I5032 Chronic diastolic (congestive) heart failure: Secondary | ICD-10-CM

## 2016-09-20 DIAGNOSIS — R7881 Bacteremia: Secondary | ICD-10-CM

## 2016-09-20 DIAGNOSIS — I130 Hypertensive heart and chronic kidney disease with heart failure and stage 1 through stage 4 chronic kidney disease, or unspecified chronic kidney disease: Secondary | ICD-10-CM | POA: Diagnosis present

## 2016-09-20 DIAGNOSIS — K830 Cholangitis: Secondary | ICD-10-CM | POA: Diagnosis present

## 2016-09-20 DIAGNOSIS — I252 Old myocardial infarction: Secondary | ICD-10-CM | POA: Diagnosis not present

## 2016-09-20 DIAGNOSIS — H9190 Unspecified hearing loss, unspecified ear: Secondary | ICD-10-CM | POA: Diagnosis present

## 2016-09-20 DIAGNOSIS — E118 Type 2 diabetes mellitus with unspecified complications: Secondary | ICD-10-CM

## 2016-09-20 DIAGNOSIS — K8309 Other cholangitis: Secondary | ICD-10-CM

## 2016-09-20 DIAGNOSIS — N189 Chronic kidney disease, unspecified: Secondary | ICD-10-CM | POA: Diagnosis present

## 2016-09-20 DIAGNOSIS — B9620 Unspecified Escherichia coli [E. coli] as the cause of diseases classified elsewhere: Secondary | ICD-10-CM

## 2016-09-20 DIAGNOSIS — F0281 Dementia in other diseases classified elsewhere with behavioral disturbance: Secondary | ICD-10-CM | POA: Diagnosis present

## 2016-09-20 DIAGNOSIS — Z87891 Personal history of nicotine dependence: Secondary | ICD-10-CM

## 2016-09-20 DIAGNOSIS — Z7982 Long term (current) use of aspirin: Secondary | ICD-10-CM

## 2016-09-20 DIAGNOSIS — K831 Obstruction of bile duct: Secondary | ICD-10-CM | POA: Diagnosis present

## 2016-09-20 DIAGNOSIS — Z539 Procedure and treatment not carried out, unspecified reason: Secondary | ICD-10-CM | POA: Diagnosis not present

## 2016-09-20 DIAGNOSIS — J439 Emphysema, unspecified: Secondary | ICD-10-CM | POA: Diagnosis present

## 2016-09-20 DIAGNOSIS — R451 Restlessness and agitation: Secondary | ICD-10-CM | POA: Diagnosis not present

## 2016-09-20 DIAGNOSIS — N179 Acute kidney failure, unspecified: Secondary | ICD-10-CM | POA: Diagnosis present

## 2016-09-20 DIAGNOSIS — R17 Unspecified jaundice: Secondary | ICD-10-CM | POA: Diagnosis not present

## 2016-09-20 DIAGNOSIS — A4150 Gram-negative sepsis, unspecified: Principal | ICD-10-CM | POA: Diagnosis present

## 2016-09-20 DIAGNOSIS — Z66 Do not resuscitate: Secondary | ICD-10-CM | POA: Diagnosis present

## 2016-09-20 DIAGNOSIS — Z950 Presence of cardiac pacemaker: Secondary | ICD-10-CM | POA: Diagnosis not present

## 2016-09-20 DIAGNOSIS — B961 Klebsiella pneumoniae [K. pneumoniae] as the cause of diseases classified elsewhere: Secondary | ICD-10-CM | POA: Diagnosis present

## 2016-09-20 DIAGNOSIS — E876 Hypokalemia: Secondary | ICD-10-CM | POA: Diagnosis not present

## 2016-09-20 DIAGNOSIS — R1310 Dysphagia, unspecified: Secondary | ICD-10-CM | POA: Diagnosis present

## 2016-09-20 DIAGNOSIS — K760 Fatty (change of) liver, not elsewhere classified: Secondary | ICD-10-CM | POA: Diagnosis present

## 2016-09-20 DIAGNOSIS — E119 Type 2 diabetes mellitus without complications: Secondary | ICD-10-CM

## 2016-09-20 HISTORY — PX: IR GENERIC HISTORICAL: IMG1180011

## 2016-09-20 LAB — CBC WITH DIFFERENTIAL/PLATELET
BASOS PCT: 0 %
Basophils Absolute: 0 10*3/uL (ref 0.0–0.1)
EOS ABS: 0 10*3/uL (ref 0.0–0.7)
EOS PCT: 0 %
HCT: 42.1 % (ref 39.0–52.0)
HEMOGLOBIN: 14.4 g/dL (ref 13.0–17.0)
LYMPHS ABS: 0.4 10*3/uL — AB (ref 0.7–4.0)
Lymphocytes Relative: 3 %
MCH: 31.9 pg (ref 26.0–34.0)
MCHC: 34.2 g/dL (ref 30.0–36.0)
MCV: 93.3 fL (ref 78.0–100.0)
MONOS PCT: 4 %
Monocytes Absolute: 0.7 10*3/uL (ref 0.1–1.0)
NEUTROS PCT: 93 %
Neutro Abs: 14.8 10*3/uL — ABNORMAL HIGH (ref 1.7–7.7)
PLATELETS: 228 10*3/uL (ref 150–400)
RBC: 4.51 MIL/uL (ref 4.22–5.81)
RDW: 13.2 % (ref 11.5–15.5)
WBC: 16 10*3/uL — AB (ref 4.0–10.5)

## 2016-09-20 LAB — URINALYSIS, ROUTINE W REFLEX MICROSCOPIC
GLUCOSE, UA: 150 mg/dL — AB
Hgb urine dipstick: NEGATIVE
KETONES UR: NEGATIVE mg/dL
Leukocytes, UA: NEGATIVE
NITRITE: NEGATIVE
PH: 7 (ref 5.0–8.0)
Protein, ur: 100 mg/dL — AB
SPECIFIC GRAVITY, URINE: 1.014 (ref 1.005–1.030)

## 2016-09-20 LAB — COMPREHENSIVE METABOLIC PANEL
ALK PHOS: 314 U/L — AB (ref 38–126)
ALT: 308 U/L — AB (ref 17–63)
AST: 964 U/L — AB (ref 15–41)
Albumin: 2.8 g/dL — ABNORMAL LOW (ref 3.5–5.0)
Anion gap: 13 (ref 5–15)
BUN: 17 mg/dL (ref 6–20)
CO2: 22 mmol/L (ref 22–32)
CREATININE: 1.49 mg/dL — AB (ref 0.61–1.24)
Calcium: 8.8 mg/dL — ABNORMAL LOW (ref 8.9–10.3)
Chloride: 100 mmol/L — ABNORMAL LOW (ref 101–111)
GFR, EST AFRICAN AMERICAN: 47 mL/min — AB (ref >60)
GFR, EST NON AFRICAN AMERICAN: 40 mL/min — AB (ref >60)
Glucose, Bld: 273 mg/dL — ABNORMAL HIGH (ref 65–99)
Potassium: 4 mmol/L (ref 3.5–5.1)
Sodium: 135 mmol/L (ref 135–145)
Total Bilirubin: 3.5 mg/dL — ABNORMAL HIGH (ref 0.3–1.2)
Total Protein: 5.8 g/dL — ABNORMAL LOW (ref 6.5–8.1)

## 2016-09-20 LAB — PROTIME-INR
INR: 1.23
PROTHROMBIN TIME: 15.6 s — AB (ref 11.4–15.2)

## 2016-09-20 LAB — I-STAT CG4 LACTIC ACID, ED
LACTIC ACID, VENOUS: 2.49 mmol/L — AB (ref 0.5–1.9)
Lactic Acid, Venous: 5.99 mmol/L (ref 0.5–1.9)

## 2016-09-20 MED ORDER — IOPAMIDOL (ISOVUE-300) INJECTION 61%
INTRAVENOUS | Status: AC
  Administered 2016-09-20: 40 mL
  Filled 2016-09-20: qty 50

## 2016-09-20 MED ORDER — AZITHROMYCIN 500 MG IV SOLR
500.0000 mg | Freq: Once | INTRAVENOUS | Status: AC
  Administered 2016-09-20: 500 mg via INTRAVENOUS
  Filled 2016-09-20: qty 500

## 2016-09-20 MED ORDER — MIDAZOLAM HCL 2 MG/2ML IJ SOLN
INTRAMUSCULAR | Status: AC | PRN
  Administered 2016-09-20: 0.5 mg via INTRAVENOUS
  Administered 2016-09-20: 1 mg via INTRAVENOUS
  Administered 2016-09-20 (×2): 0.5 mg via INTRAVENOUS

## 2016-09-20 MED ORDER — SODIUM CHLORIDE 0.9 % IV SOLN
INTRAVENOUS | Status: AC
  Administered 2016-09-21 (×2): via INTRAVENOUS

## 2016-09-20 MED ORDER — LIDOCAINE HCL 1 % IJ SOLN
INTRAMUSCULAR | Status: AC | PRN
  Administered 2016-09-20: 20 mL

## 2016-09-20 MED ORDER — SODIUM CHLORIDE 0.9 % IV BOLUS (SEPSIS)
1000.0000 mL | Freq: Once | INTRAVENOUS | Status: AC
  Administered 2016-09-20: 1000 mL via INTRAVENOUS

## 2016-09-20 MED ORDER — DEXTROSE 5 % IV SOLN
1.0000 g | Freq: Once | INTRAVENOUS | Status: AC
  Administered 2016-09-20: 1 g via INTRAVENOUS
  Filled 2016-09-20: qty 10

## 2016-09-20 MED ORDER — NITROGLYCERIN 0.4 MG SL SUBL: 0.4000 mg | SUBLINGUAL_TABLET | SUBLINGUAL | Status: DC | PRN

## 2016-09-20 MED ORDER — ACETAMINOPHEN 650 MG RE SUPP
650.0000 mg | Freq: Once | RECTAL | Status: AC
  Administered 2016-09-20: 650 mg via RECTAL

## 2016-09-20 MED ORDER — FENTANYL CITRATE (PF) 100 MCG/2ML IJ SOLN
INTRAMUSCULAR | Status: AC
  Filled 2016-09-20: qty 4

## 2016-09-20 MED ORDER — ONDANSETRON HCL 4 MG/2ML IJ SOLN: 4.0000 mg | Freq: Four times a day (QID) | INTRAMUSCULAR | Status: DC | PRN

## 2016-09-20 MED ORDER — LEVOFLOXACIN IN D5W 750 MG/150ML IV SOLN: 750.0000 mg | Freq: Once | INTRAVENOUS | Status: DC

## 2016-09-20 MED ORDER — FLUTICASONE FUROATE-VILANTEROL 100-25 MCG/INH IN AEPB
1.0000 | INHALATION_SPRAY | Freq: Every day | RESPIRATORY_TRACT | Status: DC
Start: 2016-09-21 — End: 2016-09-30
  Administered 2016-09-22 – 2016-09-28 (×3): 1 via RESPIRATORY_TRACT
  Filled 2016-09-20: qty 28

## 2016-09-20 MED ORDER — SODIUM CHLORIDE 0.9 % IV SOLN
Freq: Once | INTRAVENOUS | Status: DC
  Filled 2016-09-20: qty 1000

## 2016-09-20 MED ORDER — IOPAMIDOL (ISOVUE-300) INJECTION 61%
INTRAVENOUS | Status: AC
  Filled 2016-09-20: qty 50

## 2016-09-20 MED ORDER — FENTANYL CITRATE (PF) 100 MCG/2ML IJ SOLN
INTRAMUSCULAR | Status: AC | PRN
  Administered 2016-09-20 (×4): 25 ug via INTRAVENOUS

## 2016-09-20 MED ORDER — MIDAZOLAM HCL 2 MG/2ML IJ SOLN
INTRAMUSCULAR | Status: AC
  Filled 2016-09-20: qty 4

## 2016-09-20 MED ORDER — METRONIDAZOLE IN NACL 5-0.79 MG/ML-% IV SOLN
500.0000 mg | Freq: Once | INTRAVENOUS | Status: AC
  Administered 2016-09-21: 500 mg via INTRAVENOUS
  Filled 2016-09-20: qty 100

## 2016-09-20 MED ORDER — LIDOCAINE HCL (PF) 1 % IJ SOLN
INTRAMUSCULAR | Status: AC
  Filled 2016-09-20: qty 10

## 2016-09-20 MED ORDER — METRONIDAZOLE IN NACL 5-0.79 MG/ML-% IV SOLN
500.0000 mg | Freq: Once | INTRAVENOUS | Status: AC
  Administered 2016-09-20: 500 mg via INTRAVENOUS
  Filled 2016-09-20: qty 100

## 2016-09-20 MED ORDER — SODIUM CHLORIDE 0.9 % IV BOLUS (SEPSIS)
2000.0000 mL | Freq: Once | INTRAVENOUS | Status: AC
  Administered 2016-09-20: 2000 mL via INTRAVENOUS

## 2016-09-20 MED ORDER — ONDANSETRON HCL 4 MG PO TABS: 4.0000 mg | ORAL_TABLET | Freq: Four times a day (QID) | ORAL | Status: DC | PRN

## 2016-09-20 MED ORDER — IOPAMIDOL (ISOVUE-300) INJECTION 61%
INTRAVENOUS | Status: AC
  Administered 2016-09-20: 75 mL
  Filled 2016-09-20: qty 75

## 2016-09-20 NOTE — Consult Note (Signed)
Chief Complaint: Patient was seen in consultation today for  Chief Complaint  Patient presents with  . Code Sepsis   at the request of Dr. Brett Albino  Referring Physician(s): Dr. Brett Albino, ED Physician  Patient Status: [facility] - In-pt  History of Present Illness: [name] is a 82 y.o. male with a known history of recurrent CBD stones, s/p metallic stent placement on 07/09/2014, CAD, GERD, hyperlipidemia, COPD, and dementia currently being admitted for sepsis.   Blood work reveals a marked elevation in his AST, ALT, AP, and TB.  This is a marked change from his prior hepatic panel in 03/30/2016.  His temp in the ER was 102.5 F and he has leukocytosis with a significant left shift.   Past Medical History:  Diagnosis Date  . Bacteremia due to vancomycin resistant Enterococcus   . Cancer ([place])    precancer skin lesions  . Chronic renal insufficiency   . 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 Coronary artery disease   . Dementia in Alzheimer's disease with delirium   . Diabetes mellitus   . Duodenal ulcer   . Gallstone pancreatitis    recurrent  . GERD (gastroesophageal reflux disease)   . GI bleed   . Hearing impairment   . Hyperlipidemia   . Hypertension   . Myocardial infarction, anterior wall, subsequent care   . Obesities, morbid ([place])   . Second degree AV block, Mobitz type II    a. s/p STJ dual chamber PPM  . Sepsis ([place]) 03/2016  . TIA (transient ischemic attack)     Past Surgical History:  Procedure Laterality Date  . BILIARY STENT PLACEMENT N/A 07/09/2014   Procedure: BILIARY STENT PLACEMENT;  Surgeon: Beryle Beams, MD;  Location: [facility] ENDOSCOPY;  Service: Endoscopy;  Laterality: N/A;  . CHOLECYSTECTOMY    . ERCP N/A 05/30/2014   Procedure: ENDOSCOPIC RETROGRADE CHOLANGIOPANCREATOGRAPHY (ERCP);  Surgeon: Ladene Artist, MD;  Location: [facility] ENDOSCOPY;  Service: Endoscopy;  Laterality: N/A;  . ERCP N/A 07/09/2014   Procedure:  ENDOSCOPIC RETROGRADE CHOLANGIOPANCREATOGRAPHY (ERCP);  Surgeon: Beryle Beams, MD;  Location: [facility];  Service: Endoscopy;  Laterality: N/A;  . PACEMAKER PLACEMENT  01/01/11   STJ dual chamber PPM implanted by Dr Rayann Heman for Mobitz II   . pancreatic stent      Allergies: Doxycycline and Penicillins  Medications: Prior to Admission medications   Medication Sig Start Date End Date Taking? Authorizing Provider  aspirin 325 MG tablet Take 162.5 mg by mouth daily.    Yes Historical Provider, MD  fluticasone furoate-vilanterol (BREO ELLIPTA) 100-25 MCG/INH AEPB Inhale 1 puff into the lungs daily. 12/21/15  Yes Chesley Mires, MD  folic acid (FOLVITE) 1 MG tablet Take 1 mg by mouth daily.   Yes Historical Provider, MD  hydrochlorothiazide (HYDRODIURIL) 25 MG tablet Take 12.5 mg by mouth every morning.    Yes Historical Provider, MD  losartan (COZAAR) 50 MG tablet Take 50 mg by mouth every morning.   Yes Historical Provider, MD  MAGNESIUM SULFATE PO Take 1 tablet by mouth daily.   Yes Historical Provider, MD  metoprolol (LOPRESSOR) 50 MG tablet Take 1 tablet (50 mg total) by mouth 2 (two) times daily. 10/14/15  Yes [name], MD  Multiple Vitamins-Minerals (CENTRUM SILVER PO) Take 1 tablet by mouth every morning.    Yes Historical Provider, MD  nitroGLYCERIN (NITROSTAT) 0.4 MG SL tablet Place 0.4 mg under the tongue every 5 (five)  minutes x 3 doses as needed for chest pain.    Yes Historical Provider, MD  Omega-3 Fatty Acids (FISH OIL PO) Take 2 capsules by mouth daily.   Yes Historical Provider, MD  omeprazole (PRILOSEC) 20 MG capsule Take 20 mg by mouth daily before breakfast.  03/01/15  Yes Historical Provider, MD  polyethylene glycol (MIRALAX / GLYCOLAX) packet Take 17 g by mouth daily.   Yes Historical Provider, MD  simvastatin (ZOCOR) 20 MG tablet Take 20 mg by mouth every evening.    Yes Historical Provider, MD     Family History  Problem Relation Age of Onset  . Cerebral aneurysm  Mother   . Sudden Cardiac Death Father   . Hypertension Other     Social History   Social History  . Marital status: Married    Spouse name: [name]  . Number of children: 1  . Years of education: 6   Occupational History  . 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 Smoking status: Former Smoker    Packs/day: 1.00    Years: 57.00    Types: Cigarettes    Quit date: 01/12/1996  . Smokeless tobacco: Never Used  . Alcohol use No  . Drug use: No  . Sexual activity: Not Asked   Other Topics Concern  . None   Social History Narrative   Patient is married Physiological scientist) and lives with his wife.   Patient has one child.   Patient is retired.   Patient has a 6th grade education.   Patient is right-handed.   Patient drinks caffeine daily.     Review of Systems: A 12 point ROS discussed and pertinent positives are indicated in the HPI above.  All other systems are negative.  Review of Systems  Vital Signs: BP 93/66   Pulse 91   Temp 99.8 F (37.7 C) (Rectal)   Resp 24   SpO2 94%   Physical Exam  Constitutional: He appears well-developed and well-nourished. He appears listless. He has a sickly appearance. No distress.  HENT:  Head: Normocephalic and atraumatic.  Eyes: No scleral icterus.  Cardiovascular: Normal rate and regular rhythm.   Pulmonary/Chest: Effort normal.  Abdominal: Soft. He exhibits no distension.  Neurological: He appears listless. He is disoriented.  Skin: Skin is warm and dry.    Mallampati Score:     Imaging: Ct Head Wo Contrast  Result Date: 09/20/2016 CLINICAL DATA:  Unwitnessed fall earlier today. Altered mental status. Dementia. EXAM: CT HEAD WITHOUT CONTRAST TECHNIQUE: Contiguous axial images were obtained from the base of the skull through the vertex without intravenous contrast. COMPARISON:  05/31/2016 head CT . FINDINGS: Brain: No evidence of parenchymal hemorrhage or extra-axial fluid collection. No mass  lesion, mass effect, or midline shift. No CT evidence of acute infarction. Intracranial atherosclerosis. Generalized cerebral volume loss. Nonspecific moderate subcortical and periventricular white matter hypodensity, most in keeping with chronic small vessel ischemic change. No ventriculomegaly. Vascular: No hyperdense vessel or unexpected calcification. Skull: No evidence of calvarial fracture. Sinuses/Orbits: The visualized paranasal sinuses are essentially clear. Other:  The mastoid air cells are unopacified. IMPRESSION: 1. No evidence of acute intracranial abnormality. No evidence of calvarial fracture. 2. Generalized cerebral volume loss and moderate chronic small vessel ischemia. Electronically Signed   By: Ilona Sorrel M.D.   On: 09/20/2016 18:46   Ct Abdomen Pelvis W Contrast  Result Date: 09/20/2016 CLINICAL DATA:  82 year old male with dementia presents  after unwitnessed fall with elevated liver function tests. History of ERCP with stone extraction and biliary stent placement 07/09/2014. EXAM: CT ABDOMEN AND PELVIS WITH CONTRAST TECHNIQUE: Multidetector CT imaging of the abdomen and pelvis was performed using the standard protocol following bolus administration of intravenous contrast. CONTRAST:  75 cc ISOVUE-300 IOPAMIDOL (ISOVUE-300) INJECTION 61% COMPARISON:  03/26/2016 CT abdomen/ pelvis. Right upper quadrant abdominal sonogram from 03/29/2016. FINDINGS: Lower chest: Right lower lobe 5 mm solid pulmonary nodule (series 6/image 1), stable since 05/03/2010 and considered benign. Pacemaker leads are seen terminating in the right atrium and right ventricular apex. Coronary atherosclerosis. Hepatobiliary: Diffuse hepatic steatosis. No liver mass. Cholecystectomy. Stable pneumobilia in the left liver lobe. Stable mild central intrahepatic biliary ductal dilatation. Stable common bile duct diameter 16 mm. Biliary stent is in place in the lower third of the common bile duct. Pancreas: Stable nonspecific  mild ectasia of the ventral pancreatic duct in the pancreatic head, maximum diameter 5 mm. No pancreatic mass. No peripancreatic fluid collections. Spleen: Normal size. No mass. Adrenals/Urinary Tract: Normal adrenals. No hydronephrosis. Simple 1.2 cm renal cyst in the interpolar right kidney. Additional subcentimeter hypodense renal cortical lesions in both kidneys are too small to characterize and appear unchanged. Normal bladder. Stomach/Bowel: Grossly normal stomach. Normal caliber small bowel with no small bowel wall thickening. Normal appendix . Mild sigmoid diverticulosis, with no large bowel wall thickening or pericolonic fat stranding. Vascular/Lymphatic: Abdominal aortic atherosclerosis. Stable 3.6 cm infrarenal abdominal aortic aneurysm. Patent portal, splenic, hepatic and renal veins. No pathologically enlarged lymph nodes in the abdomen or pelvis. Reproductive: Normal size prostate. Nonspecific coarse internal prostatic calcifications are stable. Other: No pneumoperitoneum, ascites or focal fluid collection. Stable small fat containing left inguinal hernia. Musculoskeletal: No aggressive appearing focal osseous lesions. No fracture. Moderate thoracolumbar spondylosis. IMPRESSION: 1. No acute traumatic injury or other acute abnormality in the abdomen or pelvis. 2. Diffuse hepatic steatosis.  No liver mass. 3. Status post cholecystectomy. Stable biliary ductal dilatation with common bile duct diameter 16 mm. Stable well-positioned biliary stent in the lower third of the common bile duct with expected pneumobilia. 4. Aortic atherosclerosis. Stable 3.6 cm infrarenal abdominal aortic aneurysm. Recommend followup by ultrasound in 2 years. This recommendation follows ACR consensus guidelines: White Paper of the ACR Incidental Findings Committee II on Vascular Findings. J Am Coll Radiol 2013; 10:789-794. 5. Coronary atherosclerosis. 6. Mild sigmoid diverticulosis. 7. Stable small fat containing left inguinal  hernia. Electronically Signed   By: Ilona Sorrel M.D.   On: 09/20/2016 18:21   Dg Chest Port 1 View  Result Date: 09/20/2016 CLINICAL DATA:  Fever.  The patient fell. EXAM: PORTABLE CHEST 1 VIEW COMPARISON:  03/26/2016 FINDINGS: Heart size and pulmonary vascularity are normal and the lungs are clear. Pacemaker in place. No acute bone abnormality. IMPRESSION: No acute abnormalities. Electronically Signed   By: Lorriane Shire M.D.   On: 09/20/2016 16:14    Labs:  CBC:  Recent Labs  03/28/16 0224 03/29/16 0218 03/30/16 0354 09/20/16 1550  WBC 6.2 6.1 9.9 16.0*  HGB 11.9* 11.9* 13.3 14.4  HCT 37.8* 37.6* 40.2 42.1  PLT 223 234 294 228    COAGS: No results for input(s): INR, APTT in the last 8760 hours.  BMP:  Recent Labs  03/28/16 0224 03/29/16 0218 03/30/16 0354 09/20/16 1550  NA 139 139 139 135  K 3.5 4.0 4.2 4.0  CL 107 111 106 100*  CO2 '24 23 23 22  '$ GLUCOSE 172* 159* 171* 273*  BUN '15 14 9 17  '$ CALCIUM 8.6* 8.7* 9.0 8.8*  CREATININE 1.14 1.06 1.22 1.49*  GFRNONAA 56* >60 52* 40*  GFRAA >60 >60 >60 47*    LIVER FUNCTION TESTS:  Recent Labs  03/26/16 1430 03/27/16 0734 03/30/16 0354 09/20/16 1550  BILITOT 1.7* 1.0 0.7 3.5*  AST 136* 80* 48* 964*  ALT 77* 60 40 308*  ALKPHOS 152* 116 131* 314*  PROT 6.9 5.3* 5.8* 5.8*  ALBUMIN 3.3* 2.5* 3.0* 2.8*    TUMOR MARKERS: No results for input(s): AFPTM, CEA, CA199, CHROMGRNA in the last 8760 hours.  Assessment and Plan:  Acute cholangitis, probable occluded metallic biliary stent.  Given age and lactate levels, pt needs decompression urgently.  Not a good candidate for ERCP at this time.  I spoke with his wife extensively over the phone.  She undersatnds the situation and agrees to proceed with PTC and PBD placement.   His biliary system I snot particularly distended at this time and PTC may be very challenging.     Thank you for this interesting consult.  I greatly enjoyed meeting DANDRAE KUSTRA and look  forward to participating in their care.  A copy of this report was sent to the requesting provider on this date.  Electronically Signed: Jacqulynn Cadet 09/20/2016, 8:11 PM   I spent a total of 40 Minutes  in face to face in clinical consultation, greater than 50% of which was counseling/coordinating care for cholangitis.

## 2016-09-20 NOTE — ED Notes (Signed)
MD notified of pt updated HR and BP.

## 2016-09-20 NOTE — ED Notes (Signed)
Family at bedside updated.  

## 2016-09-20 NOTE — ED Triage Notes (Addendum)
Per EMS- pt here from home. Family reports has dementia. Had a unwitnessed fall earlier today and was helped back to bed. After fall family noted pt to have increased weakness throughout the day. EMS noted pt was warm to touch. Given 250 NS by EMS. 20G PIV placed to [place]. Rectal temp obtained at triage, 102.5. MD informed. Sepsis work up initiated during triage. PT is on blood thinners. Pt left hearing aids at home.

## 2016-09-20 NOTE — ED Notes (Signed)
Admitting MD trying to get in touch with pt's wife to find out code status, unable to get in touch with wife at this time.

## 2016-09-20 NOTE — Sedation Documentation (Addendum)
O2 increaed to 4l/[place].  Co2 not fitting well on pt, when pulled down to mouth cannula out of nose.

## 2016-09-20 NOTE — Sedation Documentation (Signed)
O2 2l/[place] 

## 2016-09-20 NOTE — Sedation Documentation (Signed)
Patient is resting comfortably. 

## 2016-09-20 NOTE — H&P (Signed)
History and Physical    [name] [id number] DOB: 16-May-1927 DOA: 09/20/2016  PCP: Stephens Shire, MD  Patient coming from: Home.  History obtained from patient's wife as patient is confused.  Chief Complaint: Weakness.  HPI: VUE Rich is a 82 y.o. male with history of CAD status post stenting, second degree AV block status post pacemaker placement, diabetes mellitus type 2, CHF, COPD, dementia was brought to the ER after patient was found to be weak and confused. As per patient's wife patient was getting weak over the last 2 days and today while walking to the dining table patient's slumped to the. Did not hit his head or lose consciousness. In the ER patient was found to be hypotensive with labs showing markedly elevated LFTs and Lactate. Patient has known history of CBD stones with recurrent obstruction. Has had stents placed previously. CT abdomen and pelvis was done which shows dilated duct with infrarenal aortic aneurysm. Dr. Benson Norway, patient's gastroenterologist was consulted and since patient has difficult ERCP previously IR has been consulted for drain placement and has been placed by the time I examined the patient. On my exam patient appears confused and moves all extremities.   ED Course: Fluid bolus for sepsis was given and empiric antibiotics started. Gastroenterologist and interventional radiology was consulted. Right-sided biliary drain has been placed.  Review of Systems: As per HPI, rest all negative.   Past Medical History:  Diagnosis Date  . Bacteremia due to vancomycin resistant Enterococcus   . Cancer ([place])    precancer skin lesions  . Chronic renal insufficiency   . COPD (chronic obstructive pulmonary disease) with chronic bronchitis ([place])   . Coronary artery disease   . Dementia in Alzheimer's disease with delirium   . Diabetes mellitus   . Duodenal ulcer   . Gallstone pancreatitis    recurrent  . GERD (gastroesophageal reflux disease)   . GI  bleed   . Hearing impairment   . Hyperlipidemia   . Hypertension   . Myocardial infarction, anterior wall, subsequent care   . Obesities, morbid ([place])   . Second degree AV block, Mobitz type II    a. s/p STJ dual chamber PPM  . Sepsis ([place]) 03/2016  . TIA (transient ischemic attack)     Past Surgical History:  Procedure Laterality Date  . BILIARY STENT PLACEMENT N/A 07/09/2014   Procedure: BILIARY STENT PLACEMENT;  Surgeon: Beryle Beams, MD;  Location: [facility] ENDOSCOPY;  Service: Endoscopy;  Laterality: N/A;  . CHOLECYSTECTOMY    . ERCP N/A 05/30/2014   Procedure: ENDOSCOPIC RETROGRADE CHOLANGIOPANCREATOGRAPHY (ERCP);  Surgeon: Ladene Artist, MD;  Location: [facility] ENDOSCOPY;  Service: Endoscopy;  Laterality: N/A;  . ERCP N/A 07/09/2014   Procedure: ENDOSCOPIC RETROGRADE CHOLANGIOPANCREATOGRAPHY (ERCP);  Surgeon: Beryle Beams, MD;  Location: [facility] ENDOS[facility];  Service: Endoscopy;  Laterality: N/A;  . IR GENERIC HISTORICAL  09/20/2016   IR INT EXT BILIARY DRAIN WITH CHOLANGIOGRAM 09/20/2016 Todd Cadet, MD [facility]-INTER[facility] RAD  . PACEMAKER PLACEMENT  01/01/11   STJ dual chamber PPM implanted by Dr Rayann Heman for Mobitz II   . pancreatic stent       reports that he quit smoking about 20 years ago. His smoking use included Cigarettes. He has a 57.00 pack-year smoking history. He has never used smokeless tobacco. He reports that he does not drink alcohol or use drugs.  Allergies  Allergen Reactions  . Doxycycline Other (See Comments)    Unknown- patient doesn't remember  .  Penicillins Rash    Has patient had a PCN reaction causing immediate rash, facial/tongue/throat swelling, SOB or lightheadedness with hypotension: Yes Has patient had a PCN reaction causing severe rash involving mucus membranes or skin necrosis: No Has patient had a PCN reaction that required hospitalization: No Has patient had a PCN reaction occurring within the last 10 years: Yes If all of the above answers are "NO", then may  proceed with Cephalosporin use.     Family History  Problem Relation Age of Onset  . Cerebral aneurysm Mother   . Sudden Cardiac Death Father   . Hypertension Other     Prior to Admission medications   Medication Sig Start Date End Date Taking? Authorizing Provider  aspirin 325 MG tablet Take 162.5 mg by mouth daily.    Yes Historical Provider, MD  fluticasone furoate-vilanterol (BREO ELLIPTA) 100-25 MCG/INH AEPB Inhale 1 puff into the lungs daily. 12/21/15  Yes Chesley Mires, MD  folic acid (FOLVITE) 1 MG tablet Take 1 mg by mouth daily.   Yes Historical Provider, MD  hydrochlorothiazide (HYDRODIURIL) 25 MG tablet Take 12.5 mg by mouth every morning.    Yes Historical Provider, MD  losartan (COZAAR) 50 MG tablet Take 50 mg by mouth every morning.   Yes Historical Provider, MD  MAGNESIUM SULFATE PO Take 1 tablet by mouth daily.   Yes Historical Provider, MD  metoprolol (LOPRESSOR) 50 MG tablet Take 1 tablet (50 mg total) by mouth 2 (two) times daily. 10/14/15  Yes Todd M Martinique, MD  Multiple Vitamins-Minerals (CENTRUM SILVER PO) Take 1 tablet by mouth every morning.    Yes Historical Provider, MD  nitroGLYCERIN (NITROSTAT) 0.4 MG SL tablet Place 0.4 mg under the tongue every 5 (five) minutes x 3 doses as needed for chest pain.    Yes Historical Provider, MD  Omega-3 Fatty Acids (FISH OIL PO) Take 2 capsules by mouth daily.   Yes Historical Provider, MD  omeprazole (PRILOSEC) 20 MG capsule Take 20 mg by mouth daily before breakfast.  03/01/15  Yes Historical Provider, MD  polyethylene glycol (MIRALAX / GLYCOLAX) packet Take 17 g by mouth daily.   Yes Historical Provider, MD  simvastatin (ZOCOR) 20 MG tablet Take 20 mg by mouth every evening.    Yes Historical Provider, MD    Physical Exam: Vitals:   09/20/16 2151 09/20/16 2158 09/20/16 2230 09/20/16 2245  BP: 146/77 143/86 142/79 122/78  Pulse: 95 94 92 91  Resp: (!) 95 21 24 24   Temp:      TempSrc:      SpO2: 94% 95% 93% 93%       Constitutional: Moderately built and nourished. Vitals:   09/20/16 2151 09/20/16 2158 09/20/16 2230 09/20/16 2245  BP: 146/77 143/86 142/79 122/78  Pulse: 95 94 92 91  Resp: (!) 95 21 24 24   Temp:      TempSrc:      SpO2: 94% 95% 93% 93%   Eyes: Anicteric no pallor. ENMT: No discharge from the ears eyes nose or mouth. Neck: No mass felt. No neck rigidity. Respiratory: No rhonchi or crepitations. Cardiovascular: S1 and S2 heard. No murmurs appreciated. Abdomen: Drain in place soft nontender bowel sounds present. Musculoskeletal: No edema. Skin: No rash. Chronic skin changes. Neurologic: Appears drowsy confused but follows commands. Psychiatric: Patient appears confused.   Labs on Admission: I have personally reviewed following labs and imaging studies  CBC:  Recent Labs Lab 09/20/16 1550  WBC 16.0*  NEUTROABS 14.8*  HGB  14.4  HCT 42.1  MCV 93.3  PLT [id number]   Basic Metabolic Panel:  Recent Labs Lab 09/20/16 1550  NA 135  K 4.0  CL 100*  CO2 22  GLUCOSE 273*  BUN 17  CREATININE 1.49*  CALCIUM 8.8*   GFR: CrCl cannot be calculated (Unknown ideal weight.). Liver Function Tests:  Recent Labs Lab 09/20/16 1550  AST 964*  ALT 308*  ALKPHOS 314*  BILITOT 3.5*  PROT 5.8*  ALBUMIN 2.8*   No results for input(s): LIPASE, AMYLASE in the last 168 hours. No results for input(s): AMMONIA in the last 168 hours. Coagulation Profile:  Recent Labs Lab 09/20/16 2029  INR 1.23   Cardiac Enzymes: No results for input(s): CKTOTAL, CKMB, CKMBINDEX, TROPONINI in the last 168 hours. BNP (last 3 results) No results for input(s): PROBNP in the last 8760 hours. HbA1C: No results for input(s): HGBA1C in the last 72 hours. CBG: No results for input(s): GLUCAP in the last 168 hours. Lipid Profile: No results for input(s): CHOL, HDL, LDLCALC, TRIG, CHOLHDL, LDLDIRECT in the last 72 hours. Thyroid Function Tests: No results for input(s): TSH, T4TOTAL, FREET4,  T3FREE, THYROIDAB in the last 72 hours. Anemia Panel: No results for input(s): VITAMINB12, FOLATE, FERRITIN, TIBC, IRON, RETICCTPCT in the last 72 hours. Urine analysis:    Component Value Date/Time   COLORURINE AMBER (A) 09/20/2016 1546   APPEARANCEUR HAZY (A) 09/20/2016 1546   LABSPEC 1.014 09/20/2016 1546   PHURINE 7.0 09/20/2016 1546   GLUCOSEU 150 (A) 09/20/2016 1546   HGBUR NEGATIVE 09/20/2016 1546   BILIRUBINUR SMALL (A) 09/20/2016 1546   KETONESUR NEGATIVE 09/20/2016 1546   PROTEINUR 100 (A) 09/20/2016 1546   UROBILINOGEN 2.0 (H) 06/14/2015 0840   NITRITE NEGATIVE 09/20/2016 1546   LEUKOCYTESUR NEGATIVE 09/20/2016 1546   Sepsis Labs: @LABRCNTIP (procalcitonin:4,lacticidven:4) )No results found for this or any previous visit (from the past 240 hour(s)).   Radiological Exams on Admission: Ct Head Wo Contrast  Result Date: 09/20/2016 CLINICAL DATA:  Unwitnessed fall earlier today. Altered mental status. Dementia. EXAM: CT HEAD WITHOUT CONTRAST TECHNIQUE: Contiguous axial images were obtained from the base of the skull through the vertex without intravenous contrast. COMPARISON:  05/31/2016 head CT . FINDINGS: Brain: No evidence of parenchymal hemorrhage or extra-axial fluid collection. No mass lesion, mass effect, or midline shift. No CT evidence of acute infarction. Intracranial atherosclerosis. Generalized cerebral volume loss. Nonspecific moderate subcortical and periventricular white matter hypodensity, most in keeping with chronic small vessel ischemic change. No ventriculomegaly. Vascular: No hyperdense vessel or unexpected calcification. Skull: No evidence of calvarial fracture. Sinuses/Orbits: The visualized paranasal sinuses are essentially clear. Other:  The mastoid air cells are unopacified. IMPRESSION: 1. No evidence of acute intracranial abnormality. No evidence of calvarial fracture. 2. Generalized cerebral volume loss and moderate chronic small vessel ischemia.  Electronically Signed   By: Ilona Sorrel M.D.   On: 09/20/2016 18:46   Ct Abdomen Pelvis W Contrast  Result Date: 09/20/2016 CLINICAL DATA:  82 year old male with dementia presents after unwitnessed fall with elevated liver function tests. History of ERCP with stone extraction and biliary stent placement 07/09/2014. EXAM: CT ABDOMEN AND PELVIS WITH CONTRAST TECHNIQUE: Multidetector CT imaging of the abdomen and pelvis was performed using the standard protocol following bolus administration of intravenous contrast. CONTRAST:  75 cc ISOVUE-300 IOPAMIDOL (ISOVUE-300) INJECTION 61% COMPARISON:  03/26/2016 CT abdomen/ pelvis. Right upper quadrant abdominal sonogram from 03/29/2016. FINDINGS: Lower chest: Right lower lobe 5 mm solid pulmonary nodule (series 6/image 1),  stable since 05/03/2010 and considered benign. Pacemaker leads are seen terminating in the right atrium and right ventricular apex. Coronary atherosclerosis. Hepatobiliary: Diffuse hepatic steatosis. No liver mass. Cholecystectomy. Stable pneumobilia in the left liver lobe. Stable mild central intrahepatic biliary ductal dilatation. Stable common bile duct diameter 16 mm. Biliary stent is in place in the lower third of the common bile duct. Pancreas: Stable nonspecific mild ectasia of the ventral pancreatic duct in the pancreatic head, maximum diameter 5 mm. No pancreatic mass. No peripancreatic fluid collections. Spleen: Normal size. No mass. Adrenals/Urinary Tract: Normal adrenals. No hydronephrosis. Simple 1.2 cm renal cyst in the interpolar right kidney. Additional subcentimeter hypodense renal cortical lesions in both kidneys are too small to characterize and appear unchanged. Normal bladder. Stomach/Bowel: Grossly normal stomach. Normal caliber small bowel with no small bowel wall thickening. Normal appendix . Mild sigmoid diverticulosis, with no large bowel wall thickening or pericolonic fat stranding. Vascular/Lymphatic: Abdominal aortic  atherosclerosis. Stable 3.6 cm infrarenal abdominal aortic aneurysm. Patent portal, splenic, hepatic and renal veins. No pathologically enlarged lymph nodes in the abdomen or pelvis. Reproductive: Normal size prostate. Nonspecific coarse internal prostatic calcifications are stable. Other: No pneumoperitoneum, ascites or focal fluid collection. Stable small fat containing left inguinal hernia. Musculoskeletal: No aggressive appearing focal osseous lesions. No fracture. Moderate thoracolumbar spondylosis. IMPRESSION: 1. No acute traumatic injury or other acute abnormality in the abdomen or pelvis. 2. Diffuse hepatic steatosis.  No liver mass. 3. Status post cholecystectomy. Stable biliary ductal dilatation with common bile duct diameter 16 mm. Stable well-positioned biliary stent in the lower third of the common bile duct with expected pneumobilia. 4. Aortic atherosclerosis. Stable 3.6 cm infrarenal abdominal aortic aneurysm. Recommend followup by ultrasound in 2 years. This recommendation follows ACR consensus guidelines: White Paper of the ACR Incidental Findings Committee II on Vascular Findings. J Am Coll Radiol 2013; 10:789-794. 5. Coronary atherosclerosis. 6. Mild sigmoid diverticulosis. 7. Stable small fat containing left inguinal hernia. Electronically Signed   By: Ilona Sorrel M.D.   On: 09/20/2016 18:21   Dg Chest Port 1 View  Result Date: 09/20/2016 CLINICAL DATA:  Fever.  The patient fell. EXAM: PORTABLE CHEST 1 VIEW COMPARISON:  03/26/2016 FINDINGS: Heart size and pulmonary vascularity are normal and the lungs are clear. Pacemaker in place. No acute bone abnormality. IMPRESSION: No acute abnormalities. Electronically Signed   By: Lorriane Shire M.D.   On: 09/20/2016 16:14   Ir Int Lianne Cure Biliary Drain With Cholangiogram  Result Date: 09/20/2016 INDICATION: 82 year old male with acute cholangitis with a possibly obstructed biliary stent. EXAM: IR INT-EXT BILIARY DRAIN W/ CHOLANGIOGRAM MEDICATIONS:  Patient recently received intravenous Rocephin, azithromycin and Flagyl while in the emergency room. No additional antibiotic prophylaxis administered. ANESTHESIA/SEDATION: Moderate (conscious) sedation was employed during this procedure. A total of Versed 1.5 mg and Fentanyl 125 mcg was administered intravenously. Moderate Sedation Time: 54 minutes. [name] level of consciousness and vital signs were monitored continuously by radiology nursing throughout the procedure under my direct supervision. FLUOROSCOPY TIME:  Fluoroscopy Time: 28 minutes 30 seconds (851 mGy). COMPLICATIONS: None immediate. PROCEDURE: Informed written consent was obtained from the patient after a thorough discussion of the procedural risks, benefits and alternatives. All questions were addressed. Maximal Sterile Barrier Technique was utilized including caps, mask, sterile gowns, sterile gloves, sterile drape, hand hygiene and skin antiseptic. A timeout was performed prior to the initiation of the procedure. The liver was interrogated with ultrasound. There is no significant intrahepatic biliary ductal dilatation outside of  the hilum. A Chiba needle was advanced from a lateral approach at the level of the mid axillary line. The needle was advanced of the hepatic parenchyma in then slowly withdrawn well injecting contrast material and still a of biliary radicle was then captured. A percutaneous transhepatic cholangiogram was then performed. There is significant dilatation of the common bile duct and central hepatic ducts. The more peripheral hepatic ducts are decompressed. The biliary stent is occluded, however a small amount of contrast material passes adjacent to the biliary stent and into the duodenum. A night tracks wire was successfully navigated into the biliary tree. The GDA needle was then exchanged for the Accustick sheath. A gentle hand injection of contrast material under fluoroscopy confirmed sheath location within the dilated  common bile duct. A 4 French angled glide catheter was advanced coaxially through the Accustick sheath an successfully navigated adjacent to the stent and into the duodenum overall wire. An Amplatz wire was then advanced into the duodenum. The Accustick sheath and 4 French catheter were removed. The skin tract was dilated to 10 [place] and a Cook 10.2 [place] biliary drainage catheter was advanced over the wire and formed with the locking loop in the duodenum. Hand injection of contrast material confirms final tube placement. Overall, the patient tolerated the procedure well. IMPRESSION: 1. Percutaneous transhepatic cholangiogram demonstrates an occluded stent in the common bile duct. There is slow passage of a small amount of contrast material adjacent to the stent, through the ampulla and into the duodenum. 2. There is significant dilatation of the common hepatic and common bile ducts. 3. Successful placement of a 10.2 French internal/external biliary drainage catheter which passes adjacent to the occluded stent. Signed, Criselda Peaches, MD [facility] Radiology Electronically Signed   By: [name] M.D.   On: 09/20/2016 22:06    EKG: Independently reviewed. Sinus tachycardia with IVCD.  Assessment/Plan Principal Problem:   Sepsis ([place]) Active Problems:   Coronary artery disease   DM2 (diabetes mellitus, type 2) (HCC)   Systolic CHF, chronic (HCC)   COPD with emphysema (HCC)   Pacemaker-St.Jude   Acute cholangitis   Acute encephalopathy    1. Sepsis secondary to cholangitis status post biliary drain placement by IR - continue with hydration for now will keep patient nothing by mouth until patient becomes more alert and awake. On empiric antibiotics for sepsis secondary intra-abdominal cause. Follow lactate procalcitonin levels. 2. Acute encephalopathy probably secondary to sepsis - CT head was unremarkable. Continue to closely monitor.  Diet will be started only once patient is alert and awake. 3. History of CAD status post stenting - as per patient's wife patient has not complained of any chest pain. Positive monitor. Patient is presently nothing by mouth. 4. Chronic diastolic CHF - patient is hypotensive and septic and is receiving fluids. 5. Diabetes mellitus type 2 - on sliding scale coverage. 6. History of secondary AV block status post pacemaker placement.  I did discuss with critical care. At this time patient's lactate is improving and blood pressure has improved with fluids.   DVT prophylaxis: SCDs. Code Status: DO NOT RESUSCITATE.  Family Communication: Patient's wife.  Disposition Plan: Home.  Consults called: Gastroenterologist and interventional radiologist.  Admission status: Inpatient.    Rise Patience MD [facility] Pager [phone].  If 7PM-7AM, please contact night-coverage [email or web address] Password [facility]  09/20/2016, 11:46 PM

## 2016-09-20 NOTE — ED Notes (Signed)
Pt very agitated when returned to ED room [place], ED CN notified, ED tech sitting at the bedside until safety sitter arrive.

## 2016-09-20 NOTE — ED Notes (Signed)
Pt transported to IR 

## 2016-09-20 NOTE — Procedures (Signed)
Interventional Radiology Procedure Note  Procedure: Percutaneous transhepatic cholangiogram with 13F biliary drain placement. Drain is internal/external and travels adjacent to the occluded biliary stent  Complications: None immediate   Estimated Blood Loss: 0  Recommendations: - Bag drainage - Continue abx - GI may opt for stent replacement once pt has recovered from acute cholangitis  Signed,  Criselda Peaches, MD

## 2016-09-20 NOTE — ED Notes (Signed)
Attempted to call family regarding MD request x2. No answer.

## 2016-09-20 NOTE — Sedation Documentation (Signed)
Biliary drain placed

## 2016-09-20 NOTE — ED Provider Notes (Signed)
[facility] DEPT Provider Note   CSN: [id number] Arrival date & time: 09/20/16  1535   History   Chief Complaint Chief Complaint  Patient presents with  . Code Sepsis    HPI [name] is a 82 y.o. male with a PMH of CAD w/ hx of MI, pacemaker, COPD, HTN, HLD, T2DM, dementia presenting to the ED after a fall at home and generalized weakness. History provided by the family and per EMS report, as Pt is altered. Per family, he was acting "out of it" this morning, like he "didn't know what was going on". He usually gets up in the morning to get something to drink, but he didn't get up this morning. His wife got him up and helped him into the kitchen. He was trying to get into the kitchen chair when he fell and "slid" onto the floor per his wife. She was unable to get him off the floor by herself so she called EMS. He has not been complaining of anything today. He has not had any fevers, vomiting, or diarrhea. His last BM was yesterday and was normal. His wife has noticed that he has not been drinking much today.  In triage, he was noted to have a rectal temp of 102.5, so code sepsis was initiated.   HPI  Past Medical History:  Diagnosis Date  . Bacteremia due to vancomycin resistant Enterococcus   . Cancer ([place])    precancer skin lesions  . Chronic renal insufficiency   . COPD (chronic obstructive pulmonary disease) with chronic bronchitis ([place])   . Coronary artery disease   . Dementia in Alzheimer's disease with delirium   . Diabetes mellitus   . Duodenal ulcer   . Gallstone pancreatitis    recurrent  . GERD (gastroesophageal reflux disease)   . GI bleed   . Hearing impairment   . Hyperlipidemia   . Hypertension   . Myocardial infarction, anterior wall, subsequent care   . Obesities, morbid ([place])   . Second degree AV block, Mobitz type II    a. s/p STJ dual chamber PPM  . Sepsis ([place]) 03/2016  . TIA (transient ischemic attack)     Patient Active Problem List   Diagnosis Date Noted  . Acute encephalopathy 03/27/2016  . Dehydration 03/27/2016  . SIRS (systemic inflammatory response syndrome) ([place]) 03/26/2016  . Acute cholangitis 05/30/2014  . Nonspecific abnormal results of liver function study 05/30/2014  . Nonspecific (abnormal) findings on radiological and other examination of biliary tract 05/30/2014  . Sepsis ([place]) 05/28/2014  . Other persistent mental disorders due to conditions classified elsewhere 02/03/2013  . Memory loss 02/03/2013  . Dementia with behavioral disturbance 11/01/2012  . Chronic renal insufficiency, stage II (mild) 11/01/2012  . Pacemaker-St.Jude 07/08/2012  . COPD with emphysema ([place]) 06/24/2012  . Leukocytosis 06/23/2012  . Systolic CHF, chronic ([place]) 07/11/2011  . 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 Second degree AV block, Mobitz type II   . Chest discomfort   . Coronary artery disease   . DM2 (diabetes mellitus, type 2) ([place])   . Hypertension   . Hyperlipidemia   . Obesities, morbid ([place])   . Gallstone pancreatitis   . TIA (transient ischemic attack)   . Bacteremia due to vancomycin resistant Enterococcus   . GERD (gastroesophageal reflux disease)   . Hearing impairment     Past Surgical History:  Procedure Laterality Date  . BILIARY STENT PLACEMENT N/A 07/09/2014   Procedure: BILIARY STENT PLACEMENT;  Surgeon:  Beryle Beams, MD;  Location: [facility];  Service: Endoscopy;  Laterality: N/A;  . CHOLECYSTECTOMY    . ERCP N/A 05/30/2014   Procedure: ENDOSCOPIC RETROGRADE CHOLANGIOPANCREATOGRAPHY (ERCP);  Surgeon: Ladene Artist, MD;  Location[facility] ENDOSCOPY;  Service: Endoscopy;  Laterality: N/A;  . ERCP N/A 07/09/2014   Procedure: ENDOSCOPIC RETROGRADE CHOLANGIOPANCREATOGRAPHY (ERCP);  Surgeon: Beryle Beams, MD;  Location: [facility] ENDOS[facility];  Service: Endoscopy;  Laterality: N/A;  . PACEMAKER PLACEMENT  01/01/11   STJ dual chamber PPM implanted by Dr Rayann Heman for Mobitz II   . pancreatic stent      OB  History    No data available       Home Medications    Prior to Admission medications   Medication Sig Start Date End Date Taking? Authorizing Provider  aspirin 325 MG tablet Take 162.5 mg by mouth daily.    Yes Historical Provider, MD  fluticasone furoate-vilanterol (BREO ELLIPTA) 100-25 MCG/INH AEPB Inhale 1 puff into the lungs daily. 12/21/15  Yes Chesley Mires, MD  folic acid (FOLVITE) 1 MG tablet Take 1 mg by mouth daily.   Yes Historical Provider, MD  hydrochlorothiazide (HYDRODIURIL) 25 MG tablet Take 12.5 mg by mouth every morning.    Yes Historical Provider, MD  losartan (COZAAR) 50 MG tablet Take 50 mg by mouth every morning.   Yes Historical Provider, MD  MAGNESIUM SULFATE PO Take 1 tablet by mouth daily.   Yes Historical Provider, MD  metoprolol (LOPRESSOR) 50 MG tablet Take 1 tablet (50 mg total) by mouth 2 (two) times daily. 10/14/15  Yes [name], MD  Multiple Vitamins-Minerals (CENTRUM SILVER PO) Take 1 tablet by mouth every morning.    Yes Historical Provider, MD  nitroGLYCERIN (NITROSTAT) 0.4 MG SL tablet Place 0.4 mg under the tongue every 5 (five) minutes x 3 doses as needed for chest pain.    Yes Historical Provider, MD  Omega-3 Fatty Acids (FISH OIL PO) Take 2 capsules by mouth daily.   Yes Historical Provider, MD  omeprazole (PRILOSEC) 20 MG capsule Take 20 mg by mouth daily before breakfast.  03/01/15  Yes Historical Provider, MD  polyethylene glycol (MIRALAX / GLYCOLAX) packet Take 17 g by mouth daily.   Yes Historical Provider, MD  simvastatin (ZOCOR) 20 MG tablet Take 20 mg by mouth every evening.    Yes Historical Provider, MD    Family History Family History  Problem Relation Age of Onset  . Cerebral aneurysm Mother   . Sudden Cardiac Death Father   . Hypertension Other     Social History Social History  Substance Use Topics  . Smoking status: Former Smoker    Packs/day: 1.00    Years: 57.00    Types: Cigarettes    Quit date: 01/12/1996  .  Smokeless tobacco: Never Used  . Alcohol use No     Allergies   Doxycycline and Penicillins   Review of Systems Review of Systems Unable to obtain, as Pt is altered.  Physical Exam Updated Vital Signs BP 113/74   Pulse 90   Temp 102.5 F (39.2 C) (Rectal)   Resp 23   SpO2 100%   Physical Exam  Constitutional:  Ill-appearing, alert, in NAD  HENT:  Head: Normocephalic and atraumatic.  Eyes: Conjunctivae and EOM are normal. Pupils are equal, round, and reactive to light. Scleral icterus is present.  Neck: Neck supple. No JVD present. No thyromegaly present.  Cardiovascular: Normal rate and regular rhythm.   No murmur  heard. Pulmonary/Chest:  Mild tachypnea, decreased air movement in the lung bases, upper airway noises auscultated, no wheezes or crackles  Abdominal: Soft. Bowel sounds are normal. He exhibits no distension. There is no tenderness. There is no rebound and no guarding.  Musculoskeletal: He exhibits no edema or tenderness.  Lymphadenopathy:    He has no cervical adenopathy.  Neurological: He is alert.  Not answering questions, not following commands, moving all extremities  Skin: Skin is warm and dry. No rash noted.  Nursing note and vitals reviewed.   ED Treatments / Results  Labs (all labs ordered are listed, but only abnormal results are displayed) Labs Reviewed  COMPREHENSIVE METABOLIC PANEL - Abnormal; Notable for the following:       Result Value   Chloride 100 (*)    Glucose, Bld 273 (*)    Creatinine, Ser 1.49 (*)    Calcium 8.8 (*)    Total Protein 5.8 (*)    Albumin 2.8 (*)    AST 964 (*)    ALT 308 (*)    Alkaline Phosphatase 314 (*)    Total Bilirubin 3.5 (*)    GFR calc non Af Amer 40 (*)    GFR calc Af Amer 47 (*)    All other components within normal limits  CBC WITH DIFFERENTIAL/PLATELET - Abnormal; Notable for the following:    WBC 16.0 (*)    Neutro Abs 14.8 (*)    Lymphs Abs 0.4 (*)    All other components within normal  limits  URINALYSIS, ROUTINE W REFLEX MICROSCOPIC - Abnormal; Notable for the following:    Color, Urine AMBER (*)    APPearance HAZY (*)    Glucose, UA 150 (*)    Bilirubin Urine SMALL (*)    Protein, ur 100 (*)    Bacteria, UA RARE (*)    Squamous Epithelial / LPF 0-5 (*)    Crystals PRESENT (*)    All other components within normal limits  I-STAT CG4 LACTIC ACID, ED - Abnormal; Notable for the following:    Lactic Acid, Venous 5.99 (*)    All other components within normal limits  CULTURE, BLOOD (ROUTINE X 2)  CULTURE, BLOOD (ROUTINE X 2)  URINE CULTURE  PROTIME-INR  I-STAT CG4 LACTIC ACID, ED    EKG  EKG Interpretation  Date/Time:  Thursday September 20 2016 15:50:30 EST Ventricular Rate:  115 PR Interval:    QRS Duration: 118 QT Interval:  383 QTC Calculation: 530 R Axis:   -77 Text Interpretation:  Sinus tachycardia Nonspecific IVCD with LAD LVH with secondary repolarization abnormality Inferior infarct, old Anterior Q waves, possibly due to LVH No significant change since last tracing Confirmed by FLOYD MD, DANIEL [phone]) on 09/20/2016 4:01:45 PM       Radiology Ct Head Wo Contrast  Result Date: 09/20/2016 CLINICAL DATA:  Unwitnessed fall earlier today. Altered mental status. Dementia. EXAM: CT HEAD WITHOUT CONTRAST TECHNIQUE: Contiguous axial images were obtained from the base of the skull through the vertex without intravenous contrast. COMPARISON:  05/31/2016 head CT . FINDINGS: Brain: No evidence of parenchymal hemorrhage or extra-axial fluid collection. No mass lesion, mass effect, or midline shift. No CT evidence of acute infarction. Intracranial atherosclerosis. Generalized cerebral volume loss. Nonspecific moderate subcortical and periventricular white matter hypodensity, most in keeping with chronic small vessel ischemic change. No ventriculomegaly. Vascular: No hyperdense vessel or unexpected calcification. Skull: No evidence of calvarial fracture.  Sinuses/Orbits: The visualized paranasal sinuses are essentially clear. Other:  The mastoid air cells are unopacified. IMPRESSION: 1. No evidence of acute intracranial abnormality. No evidence of calvarial fracture. 2. Generalized cerebral volume loss and moderate chronic small vessel ischemia. Electronically Signed   By: Ilona Sorrel M.D.   On: 09/20/2016 18:46   Ct Abdomen Pelvis W Contrast  Result Date: 09/20/2016 CLINICAL DATA:  82 year old male with dementia presents after unwitnessed fall with elevated liver function tests. History of ERCP with stone extraction and biliary stent placement 07/09/2014. EXAM: CT ABDOMEN AND PELVIS WITH CONTRAST TECHNIQUE: Multidetector CT imaging of the abdomen and pelvis was performed using the standard protocol following bolus administration of intravenous contrast. CONTRAST:  75 cc ISOVUE-300 IOPAMIDOL (ISOVUE-300) INJECTION 61% COMPARISON:  03/26/2016 CT abdomen/ pelvis. Right upper quadrant abdominal sonogram from 03/29/2016. FINDINGS: Lower chest: Right lower lobe 5 mm solid pulmonary nodule (series 6/image 1), stable since 05/03/2010 and considered benign. Pacemaker leads are seen terminating in the right atrium and right ventricular apex. Coronary atherosclerosis. Hepatobiliary: Diffuse hepatic steatosis. No liver mass. Cholecystectomy. Stable pneumobilia in the left liver lobe. Stable mild central intrahepatic biliary ductal dilatation. Stable common bile duct diameter 16 mm. Biliary stent is in place in the lower third of the common bile duct. Pancreas: Stable nonspecific mild ectasia of the ventral pancreatic duct in the pancreatic head, maximum diameter 5 mm. No pancreatic mass. No peripancreatic fluid collections. Spleen: Normal size. No mass. Adrenals/Urinary Tract: Normal adrenals. No hydronephrosis. Simple 1.2 cm renal cyst in the interpolar right kidney. Additional subcentimeter hypodense renal cortical lesions in both kidneys are too small to characterize  and appear unchanged. Normal bladder. Stomach/Bowel: Grossly normal stomach. Normal caliber small bowel with no small bowel wall thickening. Normal appendix . Mild sigmoid diverticulosis, with no large bowel wall thickening or pericolonic fat stranding. Vascular/Lymphatic: Abdominal aortic atherosclerosis. Stable 3.6 cm infrarenal abdominal aortic aneurysm. Patent portal, splenic, hepatic and renal veins. No pathologically enlarged lymph nodes in the abdomen or pelvis. Reproductive: Normal size prostate. Nonspecific coarse internal prostatic calcifications are stable. Other: No pneumoperitoneum, ascites or focal fluid collection. Stable small fat containing left inguinal hernia. Musculoskeletal: No aggressive appearing focal osseous lesions. No fracture. Moderate thoracolumbar spondylosis. IMPRESSION: 1. No acute traumatic injury or other acute abnormality in the abdomen or pelvis. 2. Diffuse hepatic steatosis.  No liver mass. 3. Status post cholecystectomy. Stable biliary ductal dilatation with common bile duct diameter 16 mm. Stable well-positioned biliary stent in the lower third of the common bile duct with expected pneumobilia. 4. Aortic atherosclerosis. Stable 3.6 cm infrarenal abdominal aortic aneurysm. Recommend followup by ultrasound in 2 years. This recommendation follows ACR consensus guidelines: White Paper of the ACR Incidental Findings Committee II on Vascular Findings. J Am Coll Radiol 2013; 10:789-794. 5. Coronary atherosclerosis. 6. Mild sigmoid diverticulosis. 7. Stable small fat containing left inguinal hernia. Electronically Signed   By: Ilona Sorrel M.D.   On: 09/20/2016 18:21   Dg Chest Port 1 View  Result Date: 09/20/2016 CLINICAL DATA:  Fever.  The patient fell. EXAM: PORTABLE CHEST 1 VIEW COMPARISON:  03/26/2016 FINDINGS: Heart size and pulmonary vascularity are normal and the lungs are clear. Pacemaker in place. No acute bone abnormality. IMPRESSION: No acute abnormalities.  Electronically Signed   By: Lorriane Shire M.D.   On: 09/20/2016 16:14    Procedures Procedures (including critical care time)  Medications Ordered in ED Medications  acetaminophen (TYLENOL) suppository 650 mg (650 mg Rectal Given 09/20/16 1544)  sodium chloride 0.9 % bolus 1,000 mL (0 mLs Intravenous  Stopped 09/20/16 1712)  cefTRIAXone (ROCEPHIN) 1 g in dextrose 5 % 50 mL IVPB (0 g Intravenous Stopped 09/20/16 1658)  azithromycin (ZITHROMAX) 500 mg in dextrose 5 % 250 mL IVPB (0 mg Intravenous Stopped 09/20/16 1727)  metroNIDAZOLE (FLAGYL) IVPB 500 mg (0 mg Intravenous Stopped 09/20/16 1815)  iopamidol (ISOVUE-300) 61 % injection (75 mLs  Contrast Given 09/20/16 1741)  sodium chloride 0.9 % bolus 2,000 mL (2,000 mLs Intravenous New Bag/Given 09/20/16 1715)     Initial Impression / Assessment and Plan / ED Course  I have reviewed the triage vital signs and the nursing notes.  Pertinent labs & imaging results that were available during my care of the patient were reviewed by me and considered in my medical decision making (see chart for details).  Clinical Course    Mindy is an 82 year old male with a PMH of CAD w/ hx of MI, pacemaker, COPD, HTN, HLD, T2DM, dementia, and recurrent gallstone-induced pancreatitis s/p cholecystectomy in 2012 who presented to the ED via EMS after fall at home followed by generalized weakness. He was noted to have a temperature of 102.5 in triage and code sepsis was initiated. Sepsis order set used and 30cc/kg fluids ordered. Will also get a CT head, as Pt had a fall and is on blood thinners.  4:15PM: Initial labs with WBC 16, lactic acid 5.99. CXR with apparent left lower lobe opacity although official radiologist read states no acute abnormality. Pt with tachypnea, increased secretions, mild cough, and is currently on 2L O2, so we will treat empirically for CAP with Ceftriaxone and Azithromycin.  4:45PM: CMP with AST 964, ALT 308, alk phos 314. CT  abdomen/pelvis ordered. Flagyl added to antibiotic regimen. Will consult GI, Dr. Benson Norway.   5:00PM: Spoke with Dr. Benson Norway, who will come see the patient.   6:00PM: Dr. Benson Norway recommends PTC via IR, as he is a difficult ERCP. Will consult IR.  6:30PM: Spoke with Dr. Laurence Ferrari with IR, who will come see the patient. CT abdomen/pelvis with stable ductal dilatation with CBD diameter 56m. CT head without evidence of bleeding.   8:30PM: Repeat lactic acid 2.49. Pt currently at IR. Awaiting return call from [facility].  9:00PM: Discussed with Dr. SEmmit Alexanderswith [facility]CM. They will come see him.  9:45PM: Discussed patient with Dr. KHal Hopewith [facility]. Given that his lactic acid has come down and his blood pressures have been stable, they will admit him to stepdown.  Final Clinical Impressions(s) / ED Diagnoses   Final diagnoses:  Cholangitis  Sepsis, due to unspecified organism ([facility]    New Prescriptions New Prescriptions   No medications on file     KSela Hua MD 09/20/16 2153    DDeno Etienne DO 09/20/16 2158

## 2016-09-20 NOTE — ED Notes (Signed)
Placed patient into a gown and on the monitor 

## 2016-09-20 NOTE — Consult Note (Signed)
Reason for Consult: Sepsis, elevated liver enzymes, ? Cholangitis. Referring Physician: [facility].  [name] HPI: This is an 82 year old male with a PMH of recurrent CBD stones, s/p metallic stent placement on 07/09/2014, CAD, GERD, hyperlipidemia, COPD, and dementia admitted for sepsis.  The initial thought was CAP, but the blood work reveals a marked elevation in his AST, ALT, AP, and TB.  This is a marked change from his prior hepatic panel in 03/30/2016.  Earlier today he had a fall, but he continued to feel weak afterwards.  At home he was noted to be warm.  His temp in the ER was 102.5 F.    Past Medical History:  Diagnosis Date  . Bacteremia due to vancomycin resistant Enterococcus   . Cancer (HCC)    precancer skin lesions  . Chronic renal insufficiency   . COPD (chronic obstructive pulmonary disease) with chronic bronchitis (HCC)   . Coronary artery disease   . Dementia in Alzheimer's disease with delirium   . Diabetes mellitus   . Duodenal ulcer   . Gallstone pancreatitis    recurrent  . GERD (gastroesophageal reflux disease)   . GI bleed   . Hearing impairment   . Hyperlipidemia   . Hypertension   . Myocardial infarction, anterior wall, subsequent care   . Obesities, morbid (HCC)   . Second degree AV block, Mobitz type II    a. s/p STJ dual chamber PPM  . Sepsis (HCC) 03/2016  . TIA (transient ischemic attack)     Past Surgical History:  Procedure Laterality Date  . BILIARY STENT PLACEMENT N/A 07/09/2014   Procedure: BILIARY STENT PLACEMENT;  Surgeon: Theda Belfast, MD;  Location: [facility] ENDOSCOPY;  Service: Endoscopy;  Laterality: N/A;  . CHOLECYSTECTOMY    . ERCP N/A 05/30/2014   Procedure: ENDOSCOPIC RETROGRADE CHOLANGIOPANCREATOGRAPHY (ERCP);  Surgeon: Meryl Dare, MD;  Location: [facility] ENDOSCOPY;  Service: Endoscopy;  Laterality: N/A;  . ERCP N/A 07/09/2014   Procedure: ENDOSCOPIC RETROGRADE CHOLANGIOPANCREATOGRAPHY (ERCP);  Surgeon: Theda Belfast, MD;   Location: [facility] ENDOSCOPY;  Service: Endoscopy;  Laterality: N/A;  . PACEMAKER PLACEMENT  01/01/11   STJ dual chamber PPM implanted by Dr Johney Frame for Mobitz II   . pancreatic stent      Family History  Problem Relation Age of Onset  . Cerebral aneurysm Mother   . Sudden Cardiac Death Father   . Hypertension Other     Social History:  reports that he quit smoking about 20 years ago. His smoking use included Cigarettes. He has a 57.00 pack-year smoking history. He has never used smokeless tobacco. He reports that he does not drink alcohol or use drugs.  Allergies:  Allergies  Allergen Reactions  . Doxycycline Other (See Comments)    Unknown- patient doesn't remember  . Penicillins Rash    Has patient had a PCN reaction causing immediate rash, facial/tongue/throat swelling, SOB or lightheadedness with hypotension: Yes Has patient had a PCN reaction causing severe rash involving mucus membranes or skin necrosis: No Has patient had a PCN reaction that required hospitalization: No Has patient had a PCN reaction occurring within the last 10 years: Yes If all of the above answers are "NO", then may proceed with Cephalosporin use.     Medications:  Scheduled: . iopamidol       Continuous: . azithromycin 500 mg (09/20/16 1627)  . metronidazole 500 mg (09/20/16 1715)    Results for orders placed or performed during the hospital  encounter of 09/20/16 (from the past 24 hour(s))  Urinalysis, Routine w reflex microscopic     Status: Abnormal   Collection Time: 09/20/16  3:46 PM  Result Value Ref Range   Color, Urine AMBER (A) YELLOW   APPearance HAZY (A) CLEAR   Specific Gravity, Urine 1.014 1.005 - 1.030   pH 7.0 5.0 - 8.0   Glucose, UA 150 (A) NEGATIVE mg/dL   Hgb urine dipstick NEGATIVE NEGATIVE   Bilirubin Urine SMALL (A) NEGATIVE   Ketones, ur NEGATIVE NEGATIVE mg/dL   Protein, ur 100 (A) NEGATIVE mg/dL   Nitrite NEGATIVE NEGATIVE   Leukocytes, UA NEGATIVE NEGATIVE   RBC / HPF  6-30 0 - 5 RBC/hpf   WBC, UA 0-5 0 - 5 WBC/hpf   Bacteria, UA RARE (A) NONE SEEN   Squamous Epithelial / LPF 0-5 (A) NONE SEEN   Hyaline Casts, UA PRESENT    Crystals PRESENT (A) NEGATIVE  Comprehensive metabolic panel     Status: Abnormal   Collection Time: 09/20/16  3:50 PM  Result Value Ref Range   Sodium 135 135 - 145 mmol/L   Potassium 4.0 3.5 - 5.1 mmol/L   Chloride 100 (L) 101 - 111 mmol/L   CO2 22 22 - 32 mmol/L   Glucose, Bld 273 (H) 65 - 99 mg/dL   BUN 17 6 - 20 mg/dL   Creatinine, Ser 1.49 (H) 0.61 - 1.24 mg/dL   Calcium 8.8 (L) 8.9 - 10.3 mg/dL   Total Protein 5.8 (L) 6.5 - 8.1 g/dL   Albumin 2.8 (L) 3.5 - 5.0 g/dL   AST 964 (H) 15 - 41 U/L   ALT 308 (H) 17 - 63 U/L   Alkaline Phosphatase 314 (H) 38 - 126 U/L   Total Bilirubin 3.5 (H) 0.3 - 1.2 mg/dL   GFR calc non Af Amer 40 (L) >60 mL/min   GFR calc Af Amer 47 (L) >60 mL/min   Anion gap 13 5 - 15  CBC WITH DIFFERENTIAL     Status: Abnormal   Collection Time: 09/20/16  3:50 PM  Result Value Ref Range   WBC 16.0 (H) 4.0 - 10.5 K/uL   RBC 4.51 4.22 - 5.81 MIL/uL   Hemoglobin 14.4 13.0 - 17.0 g/dL   HCT 42.1 39.0 - 52.0 %   MCV 93.3 78.0 - 100.0 fL   MCH 31.9 26.0 - 34.0 pg   MCHC 34.2 30.0 - 36.0 g/dL   RDW 13.2 11.5 - 15.5 %   Platelets 228 150 - 400 K/uL   Neutrophils Relative % 93 %   Neutro Abs 14.8 (H) 1.7 - 7.7 K/uL   Lymphocytes Relative 3 %   Lymphs Abs 0.4 (L) 0.7 - 4.0 K/uL   Monocytes Relative 4 %   Monocytes Absolute 0.7 0.1 - 1.0 K/uL   Eosinophils Relative 0 %   Eosinophils Absolute 0.0 0.0 - 0.7 K/uL   Basophils Relative 0 %   Basophils Absolute 0.0 0.0 - 0.1 K/uL  I-Stat CG4 Lactic Acid, ED  (not at  [facility])     Status: Abnormal   Collection Time: 09/20/16  4:12 PM  Result Value Ref Range   Lactic Acid, Venous 5.99 (HH) 0.5 - 1.9 mmol/L   Comment NOTIFIED PHYSICIAN      Dg Chest Port 1 View  Result Date: 09/20/2016 CLINICAL DATA:  Fever.  The patient fell. EXAM: PORTABLE CHEST 1 VIEW  COMPARISON:  03/26/2016 FINDINGS: Heart size and pulmonary vascularity are normal and  the lungs are clear. Pacemaker in place. No acute bone abnormality. IMPRESSION: No acute abnormalities. Electronically Signed   By: Lorriane Shire M.D.   On: 09/20/2016 16:14    ROS:  As stated above in the HPI otherwise negative.  Blood pressure (!) 84/56, pulse 99, temperature 102.5 F (39.2 C), temperature source Rectal, resp. rate (!) 27, SpO2 98 %.    PE: Gen: NAD, Alert and Oriented HEENT:  [place]/AT, EOMI Neck: Supple, no LAD Lungs: CTA Bilaterally CV: RRR without M/G/R ABM: Soft, NTND, +BS Ext: No C/C/E  Assessment/Plan: 1) Cholangitis. 2) Abnormal liver enzymes. 3) Dementia.   I am very familiar with the patient.  It is clear to me that he has a cholangitis as he has had multiple issues with this in the past.  I had a long discussion with the patient's wife about end of life issues.  She understands my point in that at some time we need to stop this type of intervention, however, she desires to have a percutaneous drain.  He is a difficult ERCP as he does not have a ampulla that is easy to locate.  Removing the metallic stent and replacing a new one requires that I pull out the duodenoscope, which will lead the the ampulla closing up.  In the past I required IR assistance to place a metallic stent as I was not able to locate his ampulla.  That stent placed by Dr. Kathlene Cote fell out and ultimately, at a later date, Dr. Fuller Plan was able to successfully place a plastic biliary stent.  This allowed me the conduit to place a metallic stent.  A PTC will afford me more time to make further plans about stent change.  Plan: 1) Consult IR for PTC. 2) Antibiotics.  [name] D 09/20/2016, 5:14 PM

## 2016-09-21 ENCOUNTER — Encounter (HOSPITAL_COMMUNITY): Payer: Self-pay

## 2016-09-21 DIAGNOSIS — E1165 Type 2 diabetes mellitus with hyperglycemia: Secondary | ICD-10-CM

## 2016-09-21 DIAGNOSIS — I2510 Atherosclerotic heart disease of native coronary artery without angina pectoris: Secondary | ICD-10-CM

## 2016-09-21 DIAGNOSIS — E118 Type 2 diabetes mellitus with unspecified complications: Secondary | ICD-10-CM

## 2016-09-21 DIAGNOSIS — I441 Atrioventricular block, second degree: Secondary | ICD-10-CM

## 2016-09-21 DIAGNOSIS — IMO0002 Reserved for concepts with insufficient information to code with codable children: Secondary | ICD-10-CM

## 2016-09-21 LAB — BASIC METABOLIC PANEL
Anion gap: 9 (ref 5–15)
BUN: 14 mg/dL (ref 6–20)
CO2: 20 mmol/L — AB (ref 22–32)
CREATININE: 1.23 mg/dL (ref 0.61–1.24)
Calcium: 7.9 mg/dL — ABNORMAL LOW (ref 8.9–10.3)
Chloride: 111 mmol/L (ref 101–111)
GFR calc Af Amer: 59 mL/min — ABNORMAL LOW (ref >60)
GFR calc non Af Amer: 51 mL/min — ABNORMAL LOW (ref >60)
GLUCOSE: 131 mg/dL — AB (ref 65–99)
Potassium: 3.9 mmol/L (ref 3.5–5.1)
Sodium: 140 mmol/L (ref 135–145)

## 2016-09-21 LAB — CBC WITH DIFFERENTIAL/PLATELET
BASOS ABS: 0 10*3/uL (ref 0.0–0.1)
Basophils Relative: 0 %
EOS PCT: 0 %
Eosinophils Absolute: 0 10*3/uL (ref 0.0–0.7)
HEMATOCRIT: 36.5 % — AB (ref 39.0–52.0)
HEMOGLOBIN: 12.3 g/dL — AB (ref 13.0–17.0)
LYMPHS PCT: 17 %
Lymphs Abs: 2.6 10*3/uL (ref 0.7–4.0)
MCH: 31.6 pg (ref 26.0–34.0)
MCHC: 33.7 g/dL (ref 30.0–36.0)
MCV: 93.8 fL (ref 78.0–100.0)
MONOS PCT: 5 %
Monocytes Absolute: 0.8 10*3/uL (ref 0.1–1.0)
Neutro Abs: 12.1 10*3/uL — ABNORMAL HIGH (ref 1.7–7.7)
Neutrophils Relative %: 78 %
Platelets: 249 10*3/uL (ref 150–400)
RBC: 3.89 MIL/uL — AB (ref 4.22–5.81)
RDW: 13.6 % (ref 11.5–15.5)
WBC: 15.5 10*3/uL — AB (ref 4.0–10.5)

## 2016-09-21 LAB — GLUCOSE, CAPILLARY
GLUCOSE-CAPILLARY: 104 mg/dL — AB (ref 65–99)
GLUCOSE-CAPILLARY: 119 mg/dL — AB (ref 65–99)
GLUCOSE-CAPILLARY: 133 mg/dL — AB (ref 65–99)
GLUCOSE-CAPILLARY: 134 mg/dL — AB (ref 65–99)
GLUCOSE-CAPILLARY: 201 mg/dL — AB (ref 65–99)
Glucose-Capillary: 170 mg/dL — ABNORMAL HIGH (ref 65–99)
Glucose-Capillary: 127 mg/dL — ABNORMAL HIGH (ref 65–99)

## 2016-09-21 LAB — BLOOD CULTURE ID PANEL (REFLEXED)
Acinetobacter baumannii: NOT DETECTED
CANDIDA ALBICANS: NOT DETECTED
CANDIDA GLABRATA: NOT DETECTED
CANDIDA KRUSEI: NOT DETECTED
CANDIDA PARAPSILOSIS: NOT DETECTED
CANDIDA TROPICALIS: NOT DETECTED
Carbapenem resistance: NOT DETECTED
ENTEROBACTER CLOACAE COMPLEX: NOT DETECTED
ESCHERICHIA COLI: DETECTED — AB
Enterobacteriaceae species: DETECTED — AB
Enterococcus species: NOT DETECTED
Haemophilus influenzae: NOT DETECTED
KLEBSIELLA OXYTOCA: NOT DETECTED
KLEBSIELLA PNEUMONIAE: NOT DETECTED
Listeria monocytogenes: NOT DETECTED
Neisseria meningitidis: NOT DETECTED
Proteus species: NOT DETECTED
Pseudomonas aeruginosa: NOT DETECTED
STREPTOCOCCUS PYOGENES: NOT DETECTED
STREPTOCOCCUS SPECIES: NOT DETECTED
Serratia marcescens: NOT DETECTED
Staphylococcus aureus (BCID): NOT DETECTED
Staphylococcus species: NOT DETECTED
Streptococcus agalactiae: NOT DETECTED
Streptococcus pneumoniae: NOT DETECTED

## 2016-09-21 LAB — HEPATIC FUNCTION PANEL
ALK PHOS: 189 U/L — AB (ref 38–126)
ALT: 232 U/L — AB (ref 17–63)
AST: 403 U/L — ABNORMAL HIGH (ref 15–41)
Albumin: 2.2 g/dL — ABNORMAL LOW (ref 3.5–5.0)
BILIRUBIN INDIRECT: 1.8 mg/dL — AB (ref 0.3–0.9)
BILIRUBIN TOTAL: 3 mg/dL — AB (ref 0.3–1.2)
Bilirubin, Direct: 1.2 mg/dL — ABNORMAL HIGH (ref 0.1–0.5)
Total Protein: 4.5 g/dL — ABNORMAL LOW (ref 6.5–8.1)

## 2016-09-21 LAB — URINE CULTURE

## 2016-09-21 LAB — LACTIC ACID, PLASMA
LACTIC ACID, VENOUS: 3.4 mmol/L — AB (ref 0.5–1.9)
Lactic Acid, Venous: 2.1 mmol/L (ref 0.5–1.9)
Lactic Acid, Venous: 1.2 mmol/L (ref 0.5–1.9)

## 2016-09-21 LAB — PROTIME-INR
INR: 1.06
PROTHROMBIN TIME: 13.8 s (ref 11.4–15.2)

## 2016-09-21 LAB — PROCALCITONIN: PROCALCITONIN: 29.41 ng/mL

## 2016-09-21 LAB — MRSA PCR SCREENING: MRSA BY PCR: NEGATIVE

## 2016-09-21 LAB — APTT: APTT: 25 s (ref 24–36)

## 2016-09-21 MED ORDER — SODIUM CHLORIDE 0.9 % IV BOLUS (SEPSIS)
500.0000 mL | Freq: Once | INTRAVENOUS | Status: AC
  Administered 2016-09-21: 500 mL via INTRAVENOUS

## 2016-09-21 MED ORDER — DEXTROSE 5 % IV SOLN
2.0000 g | INTRAVENOUS | Status: DC
  Administered 2016-09-21 – 2016-09-29 (×9): 2 g via INTRAVENOUS
  Filled 2016-09-21 (×11): qty 2

## 2016-09-21 MED ORDER — LEVOFLOXACIN IN D5W 500 MG/100ML IV SOLN
500.0000 mg | INTRAVENOUS | Status: DC
  Filled 2016-09-21: qty 100

## 2016-09-21 MED ORDER — INSULIN ASPART 100 UNIT/ML ~~LOC~~ SOLN
0.0000 [IU] | SUBCUTANEOUS | Status: DC
  Administered 2016-09-21 (×2): 1 [IU] via SUBCUTANEOUS
  Administered 2016-09-21: 2 [IU] via SUBCUTANEOUS
  Administered 2016-09-21: 3 [IU] via SUBCUTANEOUS
  Administered 2016-09-22: 1 [IU] via SUBCUTANEOUS
  Administered 2016-09-22 – 2016-09-23 (×2): 2 [IU] via SUBCUTANEOUS
  Administered 2016-09-23: 1 [IU] via SUBCUTANEOUS
  Administered 2016-09-23: 2 [IU] via SUBCUTANEOUS
  Administered 2016-09-23 – 2016-09-24 (×5): 1 [IU] via SUBCUTANEOUS
  Administered 2016-09-24 (×3): 2 [IU] via SUBCUTANEOUS
  Administered 2016-09-24: 1 [IU] via SUBCUTANEOUS
  Administered 2016-09-25 (×2): 3 [IU] via SUBCUTANEOUS
  Administered 2016-09-25 (×2): 2 [IU] via SUBCUTANEOUS
  Administered 2016-09-25: 3 [IU] via SUBCUTANEOUS
  Administered 2016-09-25: 2 [IU] via SUBCUTANEOUS
  Administered 2016-09-26 (×2): 3 [IU] via SUBCUTANEOUS
  Administered 2016-09-26: 2 [IU] via SUBCUTANEOUS
  Administered 2016-09-26: 3 [IU] via SUBCUTANEOUS
  Administered 2016-09-26: 2 [IU] via SUBCUTANEOUS
  Administered 2016-09-26: 3 [IU] via SUBCUTANEOUS
  Administered 2016-09-27 (×4): 2 [IU] via SUBCUTANEOUS

## 2016-09-21 MED ORDER — HALOPERIDOL LACTATE 5 MG/ML IJ SOLN: 5.0000 mg | Freq: Four times a day (QID) | INTRAMUSCULAR | Status: DC | PRN

## 2016-09-21 MED ORDER — METRONIDAZOLE IN NACL 5-0.79 MG/ML-% IV SOLN
500.0000 mg | Freq: Three times a day (TID) | INTRAVENOUS | Status: DC
  Administered 2016-09-21 – 2016-09-30 (×28): 500 mg via INTRAVENOUS
  Filled 2016-09-21 (×30): qty 100

## 2016-09-21 MED ORDER — HALOPERIDOL LACTATE 5 MG/ML IJ SOLN
5.0000 mg | Freq: Three times a day (TID) | INTRAMUSCULAR | Status: DC | PRN
  Administered 2016-09-21 – 2016-09-23 (×4): 5 mg via INTRAVENOUS
  Filled 2016-09-21 (×4): qty 1

## 2016-09-21 NOTE — Progress Notes (Signed)
Subjective: No acute events.    Objective: Vital signs in last 24 hours: Temp:  [97.4 F (36.3 C)-102.5 F (39.2 C)] 97.4 F (36.3 C) (12/29 0800) Pulse Rate:  [78-117] 93 (12/29 0800) Resp:  [19-95] 19 (12/29 0800) BP: (84-148)/(52-93) 132/93 (12/29 0800) SpO2:  [93 %-100 %] 96 % (12/29 0800) FiO2 (%):  [0 %] 0 % (12/28 2344) Weight:  [86.4 kg (190 lb 7.6 oz)] 86.4 kg (190 lb 7.6 oz) (12/29 0000) Last BM Date: 09/21/16  Intake/Output from previous day: 12/28 0701 - 12/29 0700 In: 1400 [IV Piggyback:1400] Out: 675 [Urine:175; Drains:500] Intake/Output this shift: Total I/O In: 1479.2 [I.V.:1179.2; Other:200; IV Piggyback:100] Out: 225 [Urine:100; Drains:125]  General appearance: no distress GI: soft, non-tender; bowel sounds normal; no masses,  no organomegaly  Lab Results:  Recent Labs  09/20/16 1550 09/21/16 0432  WBC 16.0* 15.5*  HGB 14.4 12.3*  HCT 42.1 36.5*  PLT 228 249   BMET  Recent Labs  09/20/16 1550 09/21/16 0853  NA 135 140  K 4.0 3.9  CL 100* 111  CO2 22 20*  GLUCOSE 273* 131*  BUN 17 14  CREATININE 1.49* 1.23  CALCIUM 8.8* 7.9*   LFT  Recent Labs  09/21/16 0853  PROT 4.5*  ALBUMIN 2.2*  AST 403*  ALT 232*  ALKPHOS 189*  BILITOT 3.0*  BILIDIR 1.2*  IBILI 1.8*   PT/INR  Recent Labs  09/20/16 2029 09/21/16 0109  LABPROT 15.6* 13.8  INR 1.23 1.06   Hepatitis Panel No results for input(s): HEPBSAG, HCVAB, HEPAIGM, HEPBIGM in the last 72 hours. C-Diff No results for input(s): CDIFFTOX in the last 72 hours. Fecal Lactopherrin No results for input(s): FECLLACTOFRN in the last 72 hours.  Studies/Results: Ct Head Wo Contrast  Result Date: 09/20/2016 CLINICAL DATA:  Unwitnessed fall earlier today. Altered mental status. Dementia. EXAM: CT HEAD WITHOUT CONTRAST TECHNIQUE: Contiguous axial images were obtained from the base of the skull through the vertex without intravenous contrast. COMPARISON:  05/31/2016 head CT . FINDINGS:  Brain: No evidence of parenchymal hemorrhage or extra-axial fluid collection. No mass lesion, mass effect, or midline shift. No CT evidence of acute infarction. Intracranial atherosclerosis. Generalized cerebral volume loss. Nonspecific moderate subcortical and periventricular white matter hypodensity, most in keeping with chronic small vessel ischemic change. No ventriculomegaly. Vascular: No hyperdense vessel or unexpected calcification. Skull: No evidence of calvarial fracture. Sinuses/Orbits: The visualized paranasal sinuses are essentially clear. Other:  The mastoid air cells are unopacified. IMPRESSION: 1. No evidence of acute intracranial abnormality. No evidence of calvarial fracture. 2. Generalized cerebral volume loss and moderate chronic small vessel ischemia. Electronically Signed   By: Ilona Sorrel M.D.   On: 09/20/2016 18:46   Ct Abdomen Pelvis W Contrast  Result Date: 09/20/2016 CLINICAL DATA:  82 year old male with dementia presents after unwitnessed fall with elevated liver function tests. History of ERCP with stone extraction and biliary stent placement 07/09/2014. EXAM: CT ABDOMEN AND PELVIS WITH CONTRAST TECHNIQUE: Multidetector CT imaging of the abdomen and pelvis was performed using the standard protocol following bolus administration of intravenous contrast. CONTRAST:  75 cc ISOVUE-300 IOPAMIDOL (ISOVUE-300) INJECTION 61% COMPARISON:  03/26/2016 CT abdomen/ pelvis. Right upper quadrant abdominal sonogram from 03/29/2016. FINDINGS: Lower chest: Right lower lobe 5 mm solid pulmonary nodule (series 6/image 1), stable since 05/03/2010 and considered benign. Pacemaker leads are seen terminating in the right atrium and right ventricular apex. Coronary atherosclerosis. Hepatobiliary: Diffuse hepatic steatosis. No liver mass. Cholecystectomy. Stable pneumobilia in the left  liver lobe. Stable mild central intrahepatic biliary ductal dilatation. Stable common bile duct diameter 16 mm. Biliary stent  is in place in the lower third of the common bile duct. Pancreas: Stable nonspecific mild ectasia of the ventral pancreatic duct in the pancreatic head, maximum diameter 5 mm. No pancreatic mass. No peripancreatic fluid collections. Spleen: Normal size. No mass. Adrenals/Urinary Tract: Normal adrenals. No hydronephrosis. Simple 1.2 cm renal cyst in the interpolar right kidney. Additional subcentimeter hypodense renal cortical lesions in both kidneys are too small to characterize and appear unchanged. Normal bladder. Stomach/Bowel: Grossly normal stomach. Normal caliber small bowel with no small bowel wall thickening. Normal appendix . Mild sigmoid diverticulosis, with no large bowel wall thickening or pericolonic fat stranding. Vascular/Lymphatic: Abdominal aortic atherosclerosis. Stable 3.6 cm infrarenal abdominal aortic aneurysm. Patent portal, splenic, hepatic and renal veins. No pathologically enlarged lymph nodes in the abdomen or pelvis. Reproductive: Normal size prostate. Nonspecific coarse internal prostatic calcifications are stable. Other: No pneumoperitoneum, ascites or focal fluid collection. Stable small fat containing left inguinal hernia. Musculoskeletal: No aggressive appearing focal osseous lesions. No fracture. Moderate thoracolumbar spondylosis. IMPRESSION: 1. No acute traumatic injury or other acute abnormality in the abdomen or pelvis. 2. Diffuse hepatic steatosis.  No liver mass. 3. Status post cholecystectomy. Stable biliary ductal dilatation with common bile duct diameter 16 mm. Stable well-positioned biliary stent in the lower third of the common bile duct with expected pneumobilia. 4. Aortic atherosclerosis. Stable 3.6 cm infrarenal abdominal aortic aneurysm. Recommend followup by ultrasound in 2 years. This recommendation follows ACR consensus guidelines: White Paper of the ACR Incidental Findings Committee II on Vascular Findings. J Am Coll Radiol 2013; 10:789-794. 5. Coronary  atherosclerosis. 6. Mild sigmoid diverticulosis. 7. Stable small fat containing left inguinal hernia. Electronically Signed   By: Ilona Sorrel M.D.   On: 09/20/2016 18:21   Dg Chest Port 1 View  Result Date: 09/20/2016 CLINICAL DATA:  Fever.  The patient fell. EXAM: PORTABLE CHEST 1 VIEW COMPARISON:  03/26/2016 FINDINGS: Heart size and pulmonary vascularity are normal and the lungs are clear. Pacemaker in place. No acute bone abnormality. IMPRESSION: No acute abnormalities. Electronically Signed   By: Lorriane Shire M.D.   On: 09/20/2016 16:14   Ir Int Lianne Cure Biliary Drain With Cholangiogram  Result Date: 09/20/2016 INDICATION: 82 year old male with acute cholangitis with a possibly obstructed biliary stent. EXAM: IR INT-EXT BILIARY DRAIN W/ CHOLANGIOGRAM MEDICATIONS: Patient recently received intravenous Rocephin, azithromycin and Flagyl while in the emergency room. No additional antibiotic prophylaxis administered. ANESTHESIA/SEDATION: Moderate (conscious) sedation was employed during this procedure. A total of Versed 1.5 mg and Fentanyl 125 mcg was administered intravenously. Moderate Sedation Time: 54 minutes. [name] level of consciousness and vital signs were monitored continuously by radiology nursing throughout the procedure under my direct supervision. FLUOROSCOPY TIME:  Fluoroscopy Time: 28 minutes 30 seconds (851 mGy). COMPLICATIONS: None immediate. PROCEDURE: Informed written consent was obtained from the patient after a thorough discussion of the procedural risks, benefits and alternatives. All questions were addressed. Maximal Sterile Barrier Technique was utilized including caps, mask, sterile gowns, sterile gloves, sterile drape, hand hygiene and skin antiseptic. A timeout was performed prior to the initiation of the procedure. The liver was interrogated with ultrasound. There is no significant intrahepatic biliary ductal dilatation outside of the hilum. A Chiba needle was advanced from  a lateral approach at the level of the mid axillary line. The needle was advanced of the hepatic parenchyma in then slowly withdrawn well injecting  contrast material and still a of biliary radicle was then captured. A percutaneous transhepatic cholangiogram was then performed. There is significant dilatation of the common bile duct and central hepatic ducts. The more peripheral hepatic ducts are decompressed. The biliary stent is occluded, however a small amount of contrast material passes adjacent to the biliary stent and into the duodenum. A night tracks wire was successfully navigated into the biliary tree. The GDA needle was then exchanged for the Accustick sheath. A gentle hand injection of contrast material under fluoroscopy confirmed sheath location within the dilated common bile duct. A 4 French angled glide catheter was advanced coaxially through the Accustick sheath an successfully navigated adjacent to the stent and into the duodenum overall wire. An Amplatz wire was then advanced into the duodenum. The Accustick sheath and 4 French catheter were removed. The skin tract was dilated to 10 [place] and a Cook 10.2 [place] biliary drainage catheter was advanced over the wire and formed with the locking loop in the duodenum. Hand injection of contrast material confirms final tube placement. Overall, the patient tolerated the procedure well. IMPRESSION: 1. Percutaneous transhepatic cholangiogram demonstrates an occluded stent in the common bile duct. There is slow passage of a small amount of contrast material adjacent to the stent, through the ampulla and into the duodenum. 2. There is significant dilatation of the common hepatic and common bile ducts. 3. Successful placement of a 10.2 French internal/external biliary drainage catheter which passes adjacent to the occluded stent. Signed, Criselda Peaches, MD [facility] Electronically Signed   By:  Jacqulynn Cadet M.D.   On: 09/20/2016 22:06    Medications:  Scheduled: . cefTRIAXone (ROCEPHIN)  IV  2 g Intravenous Q24H  . fluticasone furoate-vilanterol  1 puff Inhalation Daily  . insulin aspart  0-9 Units Subcutaneous Q4H  . metronidazole  500 mg Intravenous Q8H   Continuous: . sodium chloride 125 mL/hr at 09/21/16 1259    Assessment/Plan: 1) Cholangitis. 2) Obstructed metallic stent. 3) Dementia.   I am appreciative for IR intervention.  Once his cholangitis has resolved, I will pursue a repeat ERCP with a repeat stent placement.    Plan: 1) Continue with antibiotics.  LOS: 1 day   Masao Junker D 09/21/2016, 1:06 PM

## 2016-09-21 NOTE — Progress Notes (Signed)
Called to pt room by fellow RN Eulis Foster.  At this time pt was found to be very agitated as well as verbally and physically aggressive, attempting to hit and kick at staff, as well as attempting to pull at and remove his cardiac monitor, biliary drain, and IV lines.  Attempted to redirect the pts behavior but was unsuccessful, possibly d/t the pts h/x of dementia.  Pt was placed in 4 point soft restraints. MD notified.  Pts. Spouse [name]) notified and verbalized understanding.  Will continue to monitor.

## 2016-09-21 NOTE — Progress Notes (Signed)
Pharmacy Antibiotic Note  [name] is a 82 y.o. male admitted on 09/20/2016 with sepsis w/ cholangitis.  Pharmacy has been consulted for Ceftriaxone and Flagyl dosing. Today is D#2 of ABX. WBC elevated 15.5, Afebrile, PCT elevated ~30, LA down to 1.2. BCxrs with GNR - BCID with E Coli.   Plan: D/c levaquin Restart rocephin at 2g q24h Flagyl 500mg  IV Q8H F/u culture data, LOT, CBC, TMax, clinical picture  Height: 5\' 6"  (167.6 cm) Weight: 190 lb 7.6 oz (86.4 kg) (from Aug 2017 records) IBW/kg (Calculated) : 63.8  Temp (24hrs), Avg:99.1 F (37.3 C), Min:97.4 F (36.3 C), Max:102.5 F (39.2 C)   Recent Labs Lab 09/20/16 1550 09/20/16 1612 09/20/16 2034 09/21/16 0109 09/21/16 0432 09/21/16 0853  WBC 16.0*  --   --   --  15.5*  --   CREATININE 1.49*  --   --   --   --  1.23  LATICACIDVEN  --  5.99* 2.49* 2.1* 3.4* 1.2    Estimated Creatinine Clearance (by C-G formula based on SCr of 1.23 mg/dL) Male: 35.7 mL/min Male: 43.6 mL/min    Allergies  Allergen Reactions  . Doxycycline Other (See Comments)    Unknown- patient doesn't remember  . Penicillins Rash    Has patient had a PCN reaction causing immediate rash, facial/tongue/throat swelling, SOB or lightheadedness with hypotension: Yes Has patient had a PCN reaction causing severe rash involving mucus membranes or skin necrosis: No Has patient had a PCN reaction that required hospitalization: No Has patient had a PCN reaction occurring within the last 10 years: Yes If all of the above answers are "NO", then may proceed with Cephalosporin use.    Antimicrobials this admission:  12/29 CTX >>  12/29 Flagyl >>  12/29 Azithro >> x1 in ED 12/29 levaquin >> 12/29   Dose adjustments this admission:  na  Microbiology results:  12/28 BCx: GNR - BCID with E Coli 12/28 UCx: sent  12/28 MRSA PCR: negative   Thank you for allowing pharmacy to be a part of this patient's care.  Carlean Jews, Pharm.D. PGY1  Pharmacy Resident 12/29/201711:48 AM Pager [phone]

## 2016-09-21 NOTE — Progress Notes (Signed)
CRITICAL VALUE ALERT  Critical value received:  LActic Acid 3.4  Date of notification:  09/21/2016  Time of notification:  [id number]  Critical value read back:Yes.    Nurse who received alert:  Brien Mates RN  MD notified (1st page):  Lamar Blinks NP  Time of first page:  [phone]  MD notified (2nd page):  Time of second page:  Responding MD:  Lamar Blinks NP  Time MD [id number]

## 2016-09-21 NOTE — Progress Notes (Signed)
PROGRESS NOTE    [name]  [id number] DOB: 06/20/1927 DOA: 09/20/2016 PCP: Stephens Shire, MD   Brief Narrative:  82 y.o. 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 As per patient's wife patient was getting weak over the last 2 days and today while walking to the dining table patient's slumped to the. Did not hit his head or lose consciousness. In the ER patient was found to be hypotensive with labs showing markedly elevated LFTs and Lactate. Patient has known history of CBD stones with recurrent obstruction. Has had stents placed previously. CT abdomen and pelvis was done which shows dilated duct with infrarenal aortic aneurysm. Dr. Benson Norway, patient's gastroenterologist was consulted and since patient has difficult ERCP previously IR has been consulted for drain placement and has been placed by the time I examined the patient. On my exam patient appears confused and moves all extremities.   Subjective: 12/29  A/O 1 (does not aware, when, why). Per wife patient at baseline. She states she can be at home and would say things to her such as I want to go home. Would often see things.    Assessment & Plan:   Principal Problem:   Sepsis ([place]) Active Problems:   Coronary artery disease   DM2 (diabetes mellitus, type 2) (HCC)   Systolic CHF, chronic (HCC)   COPD with emphysema (HCC)   Pacemaker-[employer]ude   Acute cholangitis   Acute encephalopathy  Sepsis/Cholangitis -S/P IR placement Biliary drain  -Continue current antibiotics -Follow lactic acid -  Acute on Chronic Encephalopathy -Per wife patient A/O 1 most of the time - CT head was unremarkable -Haldol PRN for behavioral disturbances  Chronic Diastolic CHF -Strict I&O -Daily weight -Currently hold BP medication  secondary to patient's BP. When BP begins to climb will restart.  CAD S/P stenting -Asymptomatic continue to monitor  2d degree AV block Mobitz type II S/P Pacemaker placement.  DM type II uncontrolled with complications -7/4 Hemoglobin A1c= 7.9 -Sensitive SSI    Goals of care -PT/OT consult pending   DVT prophylaxis: SCD Code Status: DO NOT RESUSCITATE Family Communication: Wife present at bedside for discussion of plan of care Disposition Plan: SNF vs home   Consultants:  NA  Procedures/Significant Events:  12/28 CT head WO contrast:-Negative acute infarct -Generalized cerebral volume loss and moderate chronic small vessel ischemia.   VENTILATOR SETTINGS:    Cultures 1/28 urine pending 12/28 blood 2 positive Escherichia coli/Enterobacteriaceae   Antimicrobials: Anti-infectives    Start     Stop   09/21/16 1200  levofloxacin (LEVAQUIN) IVPB 500 mg  Status:  Discontinued     09/21/16 1143   09/21/16 1145  cefTRIAXone (ROCEPHIN) 2 g in dextrose 5 % 50 mL IVPB         09/21/16 0800  metroNIDAZOLE (FLAGYL) IVPB 500 mg         09/21/16 0000  levofloxacin (LEVAQUIN) IVPB 750 mg  Status:  Discontinued     09/21/16 0005   09/21/16 0000  metroNIDAZOLE (FLAGYL) IVPB 500 mg     09/21/16 0302   09/20/16 1700  metroNIDAZOLE (FLAGYL) IVPB 500 mg     09/20/16 1815   09/20/16 1630  cefTRIAXone (ROCEPHIN) 1 g in dextrose 5 % 50 mL IVPB  09/20/16 1658   09/20/16 1630  azithromycin (ZITHROMAX) 500 mg in dextrose 5 % 250 mL IVPB     09/20/16 1727       Devices    LINES / TUBES:      Continuous Infusions: . sodium chloride 125 mL/hr at 09/21/16 0034     Objective: Vitals:   09/20/16 2345 09/21/16 0000 09/21/16 0331 09/21/16 0800  BP: 108/67  136/81 (!) 132/93  Pulse: 85  78 93  Resp: 23  (!) 22 19  Temp: 98.1 F (36.7 C)  97.5 F (36.4 C) 97.4 F (36.3 C)  TempSrc: Axillary  Axillary Oral  SpO2: 93%  95% 96%  Weight:  86.4 kg (190 lb 7.6 oz)      Height:  5\' 6"  (1.676 m)      Intake/Output Summary (Last 24 hours) at 09/21/16 [id number] Last data filed at 09/21/16 0800  Gross per 24 hour  Intake          2329.17 ml  Output              675 ml  Net          1654.17 ml   Filed Weights   09/21/16 0000  Weight: 86.4 kg (190 lb 7.6 oz)    Examination:  General:  A/O 1 (does not aware, when, why), pleasantly demented, No acute respiratory distress Eyes: negative scleral hemorrhage, negative anisocoria, negative icterus ENT: Negative Runny nose, negative gingival bleeding, Neck:  Negative scars, masses, torticollis, lymphadenopathy, JVD Lungs: Clear to auscultation bilaterally without wheezes or crackles Cardiovascular: Regular rate and rhythm without murmur gallop or rub normal S1 and S2 Abdomen: Obese, negative abdominal pain, nondistended, positive soft, bowel sounds, no rebound, no ascites, no appreciable mass, right biliary drain present (draining significant amount of brownish discharge) Extremities: No significant cyanosis, clubbing, or edema bilateral lower extremities Skin: Negative rashes, lesions, ulcers Psychiatric:  Unable to fully evaluate secondary to dementia Central nervous system:  Cranial nerves II through XII intact, tongue/uvula midline, all extremities muscle strength 5/5, sensation intact throughout,  negative dysarthria, negative expressive aphasia, negative receptive aphasia.  .     Data Reviewed: Care during the described time interval was provided by me .  I have reviewed this patient's available data, including medical history, events of note, physical examination, and all test results as part of my evaluation. I have personally reviewed and interpreted all radiology studies.  CBC:  Recent Labs Lab 09/20/16 1550 09/21/16 0432  WBC 16.0* 15.5*  NEUTROABS 14.8* 12.1*  HGB 14.4 12.3*  HCT 42.1 36.5*  MCV 93.3 93.8  PLT 228 0000000   Basic Metabolic Panel:  Recent Labs Lab 09/20/16 1550  NA 135   K 4.0  CL 100*  CO2 22  GLUCOSE 273*  BUN 17  CREATININE 1.49*  CALCIUM 8.8*   GFR: Estimated Creatinine Clearance (by C-G formula based on SCr of 1.49 mg/dL (H)) Male: 29.4 mL/min Male: 36 mL/min Liver Function Tests:  Recent Labs Lab 09/20/16 1550  AST 964*  ALT 308*  ALKPHOS 314*  BILITOT 3.5*  PROT 5.8*  ALBUMIN 2.8*   No results for input(s): LIPASE, AMYLASE in the last 168 hours. No results for input(s): AMMONIA in the last 168 hours. Coagulation Profile:  Recent Labs Lab 09/20/16 2029 09/21/16 0109  INR 1.23 1.06   Cardiac Enzymes: No results for input(s): CKTOTAL, CKMB, CKMBINDEX, TROPONINI in the last 168 hours. BNP (last 3 results) No results for input(s): PROBNP in  the last 8760 hours. HbA1C: No results for input(s): HGBA1C in the last 72 hours. CBG:  Recent Labs Lab 09/21/16 0052 09/21/16 0357 09/21/16 0803  GLUCAP 201* 170* 119*   Lipid Profile: No results for input(s): CHOL, HDL, LDLCALC, TRIG, CHOLHDL, LDLDIRECT in the last 72 hours. Thyroid Function Tests: No results for input(s): TSH, T4TOTAL, FREET4, T3FREE, THYROIDAB in the last 72 hours. Anemia Panel: No results for input(s): VITAMINB12, FOLATE, FERRITIN, TIBC, IRON, RETICCTPCT in the last 72 hours. Urine analysis:    Component Value Date/Time   COLORURINE AMBER (A) 09/20/2016 1546   APPEARANCEUR HAZY (A) 09/20/2016 1546   LABSPEC 1.014 09/20/2016 1546   PHURINE 7.0 09/20/2016 1546   GLUCOSEU 150 (A) 09/20/2016 1546   HGBUR NEGATIVE 09/20/2016 1546   BILIRUBINUR SMALL (A) 09/20/2016 1546   KETONESUR NEGATIVE 09/20/2016 1546   PROTEINUR 100 (A) 09/20/2016 1546   UROBILINOGEN 2.0 (H) 06/14/2015 0840   NITRITE NEGATIVE 09/20/2016 1546   LEUKOCYTESUR NEGATIVE 09/20/2016 1546   Sepsis Labs: @LABRCNTIP (procalcitonin:4,lacticidven:4)  ) Recent Results (from the past 240 hour(s))  Blood Culture (routine x 2)     Status: None (Preliminary result)   Collection Time: 09/20/16   3:50 PM  Result Value Ref Range Status   Specimen Description BLOOD LEFT ANTECUBITAL  Final   Special Requests BOTTLES DRAWN AEROBIC AND ANAEROBIC 5CC  Final   Culture  Setup Time   Final    GRAM NEGATIVE RODS AEROBIC BOTTLE ONLY CRITICAL RESULT CALLED TO, READ BACK BY AND VERIFIED WITH: A JOHNSTON,PHARMD AT [id number] 09/21/16 BY L BENFIELD    Culture GRAM NEGATIVE RODS  Final   Report Status PENDING  Incomplete  Blood Culture ID Panel (Reflexed)     Status: Abnormal   Collection Time: 09/20/16  3:50 PM  Result Value Ref Range Status   Enterococcus species NOT DETECTED NOT DETECTED Final   Listeria monocytogenes NOT DETECTED NOT DETECTED Final   Staphylococcus species NOT DETECTED NOT DETECTED Final   Staphylococcus aureus NOT DETECTED NOT DETECTED Final   Streptococcus species NOT DETECTED NOT DETECTED Final   Streptococcus agalactiae NOT DETECTED NOT DETECTED Final   Streptococcus pneumoniae NOT DETECTED NOT DETECTED Final   Streptococcus pyogenes NOT DETECTED NOT DETECTED Final   Acinetobacter baumannii NOT DETECTED NOT DETECTED Final   Enterobacteriaceae species DETECTED (A) NOT DETECTED Final    Comment: CRITICAL RESULT CALLED TO, READ BACK BY AND VERIFIED WITH: A JOHNSTON,PHARMD AT 0841 09/21/16 BY L BENFIELD    Enterobacter cloacae complex NOT DETECTED NOT DETECTED Final   Escherichia coli DETECTED (A) NOT DETECTED Final    Comment: CRITICAL RESULT CALLED TO, READ BACK BY AND VERIFIED WITH: A JOHNSTON,PHARMD AT SG:[id number] 09/21/16 BY L BENFIELD    Klebsiella oxytoca NOT DETECTED NOT DETECTED Final   Klebsiella pneumoniae NOT DETECTED NOT DETECTED Final   Proteus species NOT DETECTED NOT DETECTED Final   Serratia marcescens NOT DETECTED NOT DETECTED Final   Carbapenem resistance NOT DETECTED NOT DETECTED Final   Haemophilus influenzae NOT DETECTED NOT DETECTED Final   Neisseria meningitidis NOT DETECTED NOT DETECTED Final   Pseudomonas aeruginosa NOT DETECTED NOT DETECTED Final    Candida albicans NOT DETECTED NOT DETECTED Final   Candida glabrata NOT DETECTED NOT DETECTED Final   Candida krusei NOT DETECTED NOT DETECTED Final   Candida parapsilosis NOT DETECTED NOT DETECTED Final   Candida tropicalis NOT DETECTED NOT DETECTED Final  MRSA PCR Screening     Status: None   Collection Time: 09/21/16  12:27 AM  Result Value Ref Range Status   MRSA by PCR NEGATIVE NEGATIVE Final    Comment:        The GeneXpert MRSA Assay (FDA approved for NASAL specimens only), is one component of a comprehensive MRSA colonization surveillance program. It is not intended to diagnose MRSA infection nor to guide or monitor treatment for MRSA infections.          Radiology Studies: Ct Head Wo Contrast  Result Date: 09/20/2016 CLINICAL DATA:  Unwitnessed fall earlier today. Altered mental status. Dementia. EXAM: CT HEAD WITHOUT CONTRAST TECHNIQUE: Contiguous axial images were obtained from the base of the skull through the vertex without intravenous contrast. COMPARISON:  05/31/2016 head CT . FINDINGS: Brain: No evidence of parenchymal hemorrhage or extra-axial fluid collection. No mass lesion, mass effect, or midline shift. No CT evidence of acute infarction. Intracranial atherosclerosis. Generalized cerebral volume loss. Nonspecific moderate subcortical and periventricular white matter hypodensity, most in keeping with chronic small vessel ischemic change. No ventriculomegaly. Vascular: No hyperdense vessel or unexpected calcification. Skull: No evidence of calvarial fracture. Sinuses/Orbits: The visualized paranasal sinuses are essentially clear. Other:  The mastoid air cells are unopacified. IMPRESSION: 1. No evidence of acute intracranial abnormality. No evidence of calvarial fracture. 2. Generalized cerebral volume loss and moderate chronic small vessel ischemia. Electronically Signed   By: Ilona Sorrel M.D.   On: 09/20/2016 18:46   Ct Abdomen Pelvis W Contrast  Result Date:  09/20/2016 CLINICAL DATA:  82 year old male with dementia presents after unwitnessed fall with elevated liver function tests. History of ERCP with stone extraction and biliary stent placement 07/09/2014. EXAM: CT ABDOMEN AND PELVIS WITH CONTRAST TECHNIQUE: Multidetector CT imaging of the abdomen and pelvis was performed using the standard protocol following bolus administration of intravenous contrast. CONTRAST:  75 cc ISOVUE-300 IOPAMIDOL (ISOVUE-300) INJECTION 61% COMPARISON:  03/26/2016 CT abdomen/ pelvis. Right upper quadrant abdominal sonogram from 03/29/2016. FINDINGS: Lower chest: Right lower lobe 5 mm solid pulmonary nodule (series 6/image 1), stable since 05/03/2010 and considered benign. Pacemaker leads are seen terminating in the right atrium and right ventricular apex. Coronary atherosclerosis. Hepatobiliary: Diffuse hepatic steatosis. No liver mass. Cholecystectomy. Stable pneumobilia in the left liver lobe. Stable mild central intrahepatic biliary ductal dilatation. Stable common bile duct diameter 16 mm. Biliary stent is in place in the lower third of the common bile duct. Pancreas: Stable nonspecific mild ectasia of the ventral pancreatic duct in the pancreatic head, maximum diameter 5 mm. No pancreatic mass. No peripancreatic fluid collections. Spleen: Normal size. No mass. Adrenals/Urinary Tract: Normal adrenals. No hydronephrosis. Simple 1.2 cm renal cyst in the interpolar right kidney. Additional subcentimeter hypodense renal cortical lesions in both kidneys are too small to characterize and appear unchanged. Normal bladder. Stomach/Bowel: Grossly normal stomach. Normal caliber small bowel with no small bowel wall thickening. Normal appendix . Mild sigmoid diverticulosis, with no large bowel wall thickening or pericolonic fat stranding. Vascular/Lymphatic: Abdominal aortic atherosclerosis. Stable 3.6 cm infrarenal abdominal aortic aneurysm. Patent portal, splenic, hepatic and renal veins. No  pathologically enlarged lymph nodes in the abdomen or pelvis. Reproductive: Normal size prostate. Nonspecific coarse internal prostatic calcifications are stable. Other: No pneumoperitoneum, ascites or focal fluid collection. Stable small fat containing left inguinal hernia. Musculoskeletal: No aggressive appearing focal osseous lesions. No fracture. Moderate thoracolumbar spondylosis. IMPRESSION: 1. No acute traumatic injury or other acute abnormality in the abdomen or pelvis. 2. Diffuse hepatic steatosis.  No liver mass. 3. Status post cholecystectomy. Stable biliary ductal dilatation  with common bile duct diameter 16 mm. Stable well-positioned biliary stent in the lower third of the common bile duct with expected pneumobilia. 4. Aortic atherosclerosis. Stable 3.6 cm infrarenal abdominal aortic aneurysm. Recommend followup by ultrasound in 2 years. This recommendation follows ACR consensus guidelines: White Paper of the ACR Incidental Findings Committee II on Vascular Findings. J Am Coll Radiol 2013; 10:789-794. 5. Coronary atherosclerosis. 6. Mild sigmoid diverticulosis. 7. Stable small fat containing left inguinal hernia. Electronically Signed   By: Ilona Sorrel M.D.   On: 09/20/2016 18:21   Dg Chest Port 1 View  Result Date: 09/20/2016 CLINICAL DATA:  Fever.  The patient fell. EXAM: PORTABLE CHEST 1 VIEW COMPARISON:  03/26/2016 FINDINGS: Heart size and pulmonary vascularity are normal and the lungs are clear. Pacemaker in place. No acute bone abnormality. IMPRESSION: No acute abnormalities. Electronically Signed   By: Lorriane Shire M.D.   On: 09/20/2016 16:14   Ir Int Lianne Cure Biliary Drain With Cholangiogram  Result Date: 09/20/2016 INDICATION: 82 year old male with acute cholangitis with a possibly obstructed biliary stent. EXAM: IR INT-EXT BILIARY DRAIN W/ CHOLANGIOGRAM MEDICATIONS: Patient recently received intravenous Rocephin, azithromycin and Flagyl while in the emergency room. No additional  antibiotic prophylaxis administered. ANESTHESIA/SEDATION: Moderate (conscious) sedation was employed during this procedure. A total of Versed 1.5 mg and Fentanyl 125 mcg was administered intravenously. Moderate Sedation Time: 54 minutes. [name] level of consciousness and vital signs were monitored continuously by radiology nursing throughout the procedure under my direct supervision. FLUOROSCOPY TIME:  Fluoroscopy Time: 28 minutes 30 seconds (851 mGy). COMPLICATIONS: None immediate. PROCEDURE: Informed written consent was obtained from the patient after a thorough discussion of the procedural risks, benefits and alternatives. All questions were addressed. Maximal Sterile Barrier Technique was utilized including caps, mask, sterile gowns, sterile gloves, sterile drape, hand hygiene and skin antiseptic. A timeout was performed prior to the initiation of the procedure. The liver was interrogated with ultrasound. There is no significant intrahepatic biliary ductal dilatation outside of the hilum. A Chiba needle was advanced from a lateral approach at the level of the mid axillary line. The needle was advanced of the hepatic parenchyma in then slowly withdrawn well injecting contrast material and still a of biliary radicle was then captured. A percutaneous transhepatic cholangiogram was then performed. There is significant dilatation of the common bile duct and central hepatic ducts. The more peripheral hepatic ducts are decompressed. The biliary stent is occluded, however a small amount of contrast material passes adjacent to the biliary stent and into the duodenum. A night tracks wire was successfully navigated into the biliary tree. The GDA needle was then exchanged for the Accustick sheath. A gentle hand injection of contrast material under fluoroscopy confirmed sheath location within the dilated common bile duct. A 4 French angled glide catheter was advanced coaxially through the Accustick sheath an  successfully navigated adjacent to the stent and into the duodenum overall wire. An Amplatz wire was then advanced into the duodenum. The Accustick sheath and 4 French catheter were removed. The skin tract was dilated to 10 [place] and a Cook 10.2 [place] biliary drainage catheter was advanced over the wire and formed with the locking loop in the duodenum. Hand injection of contrast material confirms final tube placement. Overall, the patient tolerated the procedure well. IMPRESSION: 1. Percutaneous transhepatic cholangiogram demonstrates an occluded stent in the common bile duct. There is slow passage of a small amount of contrast material adjacent to the stent, through the ampulla and into  the duodenum. 2. There is significant dilatation of the common hepatic and common bile ducts. 3. Successful placement of a 10.2 French internal/external biliary drainage catheter which passes adjacent to the occluded stent. Signed, Criselda Peaches, MD [facility] Radiology Electronically Signed   By: Jacqulynn Cadet M.D.   On: 09/20/2016 22:06        Scheduled Meds: . fluticasone furoate-vilanterol  1 puff Inhalation Daily  . insulin aspart  0-9 Units Subcutaneous Q4H  . levofloxacin (LEVAQUIN) IV  500 mg Intravenous Q24H  . metronidazole  500 mg Intravenous Q8H   Continuous Infusions: . sodium chloride 125 mL/hr at 09/21/16 0034     LOS: 1 day    Time spent: 40 minutes    Yahsir Wickens, Geraldo Docker, MD [facility] Pager [phone]   If 7PM-7AM, please contact night-coverage [email or web address] Password [facility] 09/21/2016, 9:37 AM

## 2016-09-21 NOTE — Progress Notes (Addendum)
Pharmacy Antibiotic Note  [name] is a 82 y.o. male admitted on 09/20/2016 with sepsis w/ cholangitis.  Pharmacy has been consulted for Levaquin and Flagyl dosing.  Plan: Rec'd Rocephin 1g and azithromycin 500mg  in ED. Levaquin 500mg  IV Q24H (starting ~18hr after azithro, covered w/ Rocephin). Flagyl 500mg  IV Q8H.  Height: 5\' 6"  (167.6 cm) Weight: 190 lb 7.6 oz (86.4 kg) (from Aug 2017 records) IBW/kg (Calculated) : 63.8  Temp (24hrs), Avg:101.2 F (38.4 C), Min:99.8 F (37.7 C), Max:102.5 F (39.2 C)   Recent Labs Lab 09/20/16 1550 09/20/16 1612 09/20/16 2034  WBC 16.0*  --   --   CREATININE 1.49*  --   --   LATICACIDVEN  --  5.99* 2.49*    Estimated Creatinine Clearance (by C-G formula based on SCr of 1.49 mg/dL (H)) Male: 29.4 mL/min Male: 36 mL/min    Allergies  Allergen Reactions  . Doxycycline Other (See Comments)    Unknown- patient doesn't remember  . Penicillins Rash    Has patient had a PCN reaction causing immediate rash, facial/tongue/throat swelling, SOB or lightheadedness with hypotension: Yes Has patient had a PCN reaction causing severe rash involving mucus membranes or skin necrosis: No Has patient had a PCN reaction that required hospitalization: No Has patient had a PCN reaction occurring within the last 10 years: Yes If all of the above answers are "NO", then may proceed with Cephalosporin use.      Thank you for allowing pharmacy to be a part of this patient's care.  Wynona Neat, PharmD, BCPS  09/21/2016 12:07 AM

## 2016-09-21 NOTE — Progress Notes (Signed)
Referring Physician(s): Dr Philis Pique  Supervising Physician: Aletta Edouard  Patient Status:  [facility] - In-pt  Chief Complaint:  Biliary obstruction 12/28: Procedure: Percutaneous transhepatic cholangiogram with 85F biliary drain placement. Drain is internal/external and travels adjacent to the occluded biliary stent  Subjective:  Resting comfortably Pleasantly confused Labs better Less N/V per wife Site with some pain occasionally  Allergies: Doxycycline and Penicillins  Medications: Prior to Admission medications   Medication Sig Start Date End Date Taking? Authorizing Provider  aspirin 325 MG tablet Take 162.5 mg by mouth daily.    Yes Historical Provider, MD  fluticasone furoate-vilanterol (BREO ELLIPTA) 100-25 MCG/INH AEPB Inhale 1 puff into the lungs daily. 12/21/15  Yes Chesley Mires, MD  folic acid (FOLVITE) 1 MG tablet Take 1 mg by mouth daily.   Yes Historical Provider, MD  hydrochlorothiazide (HYDRODIURIL) 25 MG tablet Take 12.5 mg by mouth every morning.    Yes Historical Provider, MD  losartan (COZAAR) 50 MG tablet Take 50 mg by mouth every morning.   Yes Historical Provider, MD  MAGNESIUM SULFATE PO Take 1 tablet by mouth daily.   Yes Historical Provider, MD  metoprolol (LOPRESSOR) 50 MG tablet Take 1 tablet (50 mg total) by mouth 2 (two) times daily. 10/14/15  Yes [name], MD  Multiple Vitamins-Minerals (CENTRUM SILVER PO) Take 1 tablet by mouth every morning.    Yes Historical Provider, MD  nitroGLYCERIN (NITROSTAT) 0.4 MG SL tablet Place 0.4 mg under the tongue every 5 (five) minutes x 3 doses as needed for chest pain.    Yes Historical Provider, MD  Omega-3 Fatty Acids (FISH OIL PO) Take 2 capsules by mouth daily.   Yes Historical Provider, MD  omeprazole (PRILOSEC) 20 MG capsule Take 20 mg by mouth daily before breakfast.  03/01/15  Yes Historical Provider, MD  polyethylene glycol (MIRALAX / GLYCOLAX) packet Take 17 g by mouth daily.   Yes Historical  Provider, MD  simvastatin (ZOCOR) 20 MG tablet Take 20 mg by mouth every evening.    Yes Historical Provider, MD     Vital Signs: BP (!) 132/93 (BP Location: Right Arm)   Pulse 93   Temp 97.4 F (36.3 C) (Oral)   Resp 19   Ht _0  (1.676 m)   Wt 190 lb 7.6 oz (86.4 kg) Comment: from Aug 2017 records  SpO2 96%   BMI 30.74 kg/m   Physical Exam  Pulmonary/Chest: Effort normal.  Abdominal: Soft. There is tenderness.  Musculoskeletal: Normal range of motion.  Skin: Skin is warm and dry.  Site of Bili drain is clean and dry Sl tender to touch Output good: 500 cc yesterday 125 cc recorded---100 cc in bag: bilious afeb LFTs better TB 3 (3.5)   Nursing note and vitals reviewed.   Imaging: Ct Head Wo Contrast  Result Date: 09/20/2016 CLINICAL DATA:  Unwitnessed fall earlier today. Altered mental status. Dementia. EXAM: CT HEAD WITHOUT CONTRAST TECHNIQUE: Contiguous axial images were obtained from the base of the skull through the vertex without intravenous contrast. COMPARISON:  05/31/2016 head CT . FINDINGS: Brain: No evidence of parenchymal hemorrhage or extra-axial fluid collection. No mass lesion, mass effect, or midline shift. No CT evidence of acute infarction. Intracranial atherosclerosis. Generalized cerebral volume loss. Nonspecific moderate subcortical and periventricular white matter hypodensity, most in keeping with chronic small vessel ischemic change. No ventriculomegaly. Vascular: No hyperdense vessel or unexpected calcification. Skull: No evidence of calvarial fracture. Sinuses/Orbits: The visualized paranasal sinuses are essentially  clear. Other:  The mastoid air cells are unopacified. IMPRESSION: 1. No evidence of acute intracranial abnormality. No evidence of calvarial fracture. 2. Generalized cerebral volume loss and moderate chronic small vessel ischemia. Electronically Signed   By: Ilona Sorrel M.D.   On: 09/20/2016 18:46   Ct Abdomen Pelvis W Contrast  Result  Date: 09/20/2016 CLINICAL DATA:  82 year old male with dementia presents after unwitnessed fall with elevated liver function tests. History of ERCP with stone extraction and biliary stent placement 07/09/2014. EXAM: CT ABDOMEN AND PELVIS WITH CONTRAST TECHNIQUE: Multidetector CT imaging of the abdomen and pelvis was performed using the standard protocol following bolus administration of intravenous contrast. CONTRAST:  75 cc ISOVUE-300 IOPAMIDOL (ISOVUE-300) INJECTION 61% COMPARISON:  03/26/2016 CT abdomen/ pelvis. Right upper quadrant abdominal sonogram from 03/29/2016. FINDINGS: Lower chest: Right lower lobe 5 mm solid pulmonary nodule (series 6/image 1), stable since 05/03/2010 and considered benign. Pacemaker leads are seen terminating in the right atrium and right ventricular apex. Coronary atherosclerosis. Hepatobiliary: Diffuse hepatic steatosis. No liver mass. Cholecystectomy. Stable pneumobilia in the left liver lobe. Stable mild central intrahepatic biliary ductal dilatation. Stable common bile duct diameter 16 mm. Biliary stent is in place in the lower third of the common bile duct. Pancreas: Stable nonspecific mild ectasia of the ventral pancreatic duct in the pancreatic head, maximum diameter 5 mm. No pancreatic mass. No peripancreatic fluid collections. Spleen: Normal size. No mass. Adrenals/Urinary Tract: Normal adrenals. No hydronephrosis. Simple 1.2 cm renal cyst in the interpolar right kidney. Additional subcentimeter hypodense renal cortical lesions in both kidneys are too small to characterize and appear unchanged. Normal bladder. Stomach/Bowel: Grossly normal stomach. Normal caliber small bowel with no small bowel wall thickening. Normal appendix . Mild sigmoid diverticulosis, with no large bowel wall thickening or pericolonic fat stranding. Vascular/Lymphatic: Abdominal aortic atherosclerosis. Stable 3.6 cm infrarenal abdominal aortic aneurysm. Patent portal, splenic, hepatic and renal veins.  No pathologically enlarged lymph nodes in the abdomen or pelvis. Reproductive: Normal size prostate. Nonspecific coarse internal prostatic calcifications are stable. Other: No pneumoperitoneum, ascites or focal fluid collection. Stable small fat containing left inguinal hernia. Musculoskeletal: No aggressive appearing focal osseous lesions. No fracture. Moderate thoracolumbar spondylosis. IMPRESSION: 1. No acute traumatic injury or other acute abnormality in the abdomen or pelvis. 2. Diffuse hepatic steatosis.  No liver mass. 3. Status post cholecystectomy. Stable biliary ductal dilatation with common bile duct diameter 16 mm. Stable well-positioned biliary stent in the lower third of the common bile duct with expected pneumobilia. 4. Aortic atherosclerosis. Stable 3.6 cm infrarenal abdominal aortic aneurysm. Recommend followup by ultrasound in 2 years. This recommendation follows ACR consensus guidelines: White Paper of the ACR Incidental Findings Committee II on Vascular Findings. J Am Coll Radiol 2013; 10:789-794. 5. Coronary atherosclerosis. 6. Mild sigmoid diverticulosis. 7. Stable small fat containing left inguinal hernia. Electronically Signed   By: Ilona Sorrel M.D.   On: 09/20/2016 18:21   Dg Chest Port 1 View  Result Date: 09/20/2016 CLINICAL DATA:  Fever.  The patient fell. EXAM: PORTABLE CHEST 1 VIEW COMPARISON:  03/26/2016 FINDINGS: Heart size and pulmonary vascularity are normal and the lungs are clear. Pacemaker in place. No acute bone abnormality. IMPRESSION: No acute abnormalities. Electronically Signed   By: Lorriane Shire M.D.   On: 09/20/2016 16:14   Ir Int Lianne Cure Biliary Drain With Cholangiogram  Result Date: 09/20/2016 INDICATION: 82 year old male with acute cholangitis with a possibly obstructed biliary stent. EXAM: IR INT-EXT BILIARY DRAIN W/ CHOLANGIOGRAM MEDICATIONS: Patient recently  received intravenous Rocephin, azithromycin and Flagyl while in the emergency room. No additional  antibiotic prophylaxis administered. ANESTHESIA/SEDATION: Moderate (conscious) sedation was employed during this procedure. A total of Versed 1.5 mg and Fentanyl 125 mcg was administered intravenously. Moderate Sedation Time: 54 minutes. [name] level of consciousness and vital signs were monitored continuously by radiology nursing throughout the procedure under my direct supervision. FLUOROSCOPY TIME:  Fluoroscopy Time: 28 minutes 30 seconds (851 mGy). COMPLICATIONS: None immediate. PROCEDURE: Informed written consent was obtained from the patient after a thorough discussion of the procedural risks, benefits and alternatives. All questions were addressed. Maximal Sterile Barrier Technique was utilized including caps, mask, sterile gowns, sterile gloves, sterile drape, hand hygiene and skin antiseptic. A timeout was performed prior to the initiation of the procedure. The liver was interrogated with ultrasound. There is no significant intrahepatic biliary ductal dilatation outside of the hilum. A Chiba needle was advanced from a lateral approach at the level of the mid axillary line. The needle was advanced of the hepatic parenchyma in then slowly withdrawn well injecting contrast material and still a of biliary radicle was then captured. A percutaneous transhepatic cholangiogram was then performed. There is significant dilatation of the common bile duct and central hepatic ducts. The more peripheral hepatic ducts are decompressed. The biliary stent is occluded, however a small amount of contrast material passes adjacent to the biliary stent and into the duodenum. A night tracks wire was successfully navigated into the biliary tree. The GDA needle was then exchanged for the Accustick sheath. A gentle hand injection of contrast material under fluoroscopy confirmed sheath location within the dilated common bile duct. A 4 French angled glide catheter was advanced coaxially through the Accustick sheath an  successfully navigated adjacent to the stent and into the duodenum overall wire. An Amplatz wire was then advanced into the duodenum. The Accustick sheath and 4 French catheter were removed. The skin tract was dilated to 10 [place] and a Cook 10.2 [place] biliary drainage catheter was advanced over the wire and formed with the locking loop in the duodenum. Hand injection of contrast material confirms final tube placement. Overall, the patient tolerated the procedure well. IMPRESSION: 1. Percutaneous transhepatic cholangiogram demonstrates an occluded stent in the common bile duct. There is slow passage of a small amount of contrast material adjacent to the stent, through the ampulla and into the duodenum. 2. There is significant dilatation of the common hepatic and common bile ducts. 3. Successful placement of a 10.2 French internal/external biliary drainage catheter which passes adjacent to the occluded stent. Signed, Criselda Peaches, MD [facility] Radiology Electronically Signed   By: Jacqulynn Cadet M.D.   On: 09/20/2016 22:06    Labs:  CBC:  Recent Labs  03/29/16 0218 03/30/16 0354 09/20/16 1550 09/21/16 0432  WBC 6.1 9.9 16.0* 15.5*  HGB 11.9* 13.3 14.4 12.3*  HCT 37.6* 40.2 42.1 36.5*  PLT 234 294 228 249    COAGS:  Recent Labs  09/20/16 2029 09/21/16 0109  INR 1.23 1.06  APTT  --  25    BMP:  Recent Labs  03/29/16 0218 03/30/16 0354 09/20/16 1550 09/21/16 0853  NA 139 139 135 140  K 4.0 4.2 4.0 3.9  CL 111 106 100* 111  CO2 _0 20*  GLUCOSE 159* 171* 273* 131*  BUN _1 CALCIUM 8.7* 9.0 8.8* 7.9*  CREATININE 1.06 1.22 1.49* 1.23  GFRNONAA >60 52* 40* 51*  GFRAA >60 >60 47* 59*    LIVER FUNCTION TESTS:  Recent Labs  03/27/16 0734 03/30/16 0354 09/20/16 1550 09/21/16 0853  BILITOT 1.0 0.7 3.5* 3.0*  AST 80* 48* 964* 403*  ALT 60 40 308* 232*  ALKPHOS 116 131* 314* 189*  PROT 5.3* 5.8*  5.8* 4.5*  ALBUMIN 2.5* 3.0* 2.8* 2.2*    Assessment and Plan:  Biliary obstruction PTC/drain placed in IR 12/28 pm Draining well Will follow  Electronically Signed: Danyel Tobey A 09/21/2016, 12:25 PM   I spent a total of 15 Minutes at the the patient's bedside AND on the patient's hospital floor or unit, greater than 50% of which was counseling/coordinating care for PTC/drain

## 2016-09-21 NOTE — Progress Notes (Signed)
PHARMACY - PHYSICIAN COMMUNICATION CRITICAL VALUE ALERT - BLOOD CULTURE IDENTIFICATION (BCID)  Results for orders placed or performed during the hospital encounter of 09/20/16  Blood Culture ID Panel (Reflexed) (Collected: 09/20/2016  3:50 PM)  Result Value Ref Range   Enterococcus species NOT DETECTED NOT DETECTED   Listeria monocytogenes NOT DETECTED NOT DETECTED   Staphylococcus species NOT DETECTED NOT DETECTED   Staphylococcus aureus NOT DETECTED NOT DETECTED   Streptococcus species NOT DETECTED NOT DETECTED   Streptococcus agalactiae NOT DETECTED NOT DETECTED   Streptococcus pneumoniae NOT DETECTED NOT DETECTED   Streptococcus pyogenes NOT DETECTED NOT DETECTED   Acinetobacter baumannii NOT DETECTED NOT DETECTED   Enterobacteriaceae species DETECTED (A) NOT DETECTED   Enterobacter cloacae complex NOT DETECTED NOT DETECTED   Escherichia coli DETECTED (A) NOT DETECTED   Klebsiella oxytoca NOT DETECTED NOT DETECTED   Klebsiella pneumoniae NOT DETECTED NOT DETECTED   Proteus species NOT DETECTED NOT DETECTED   Serratia marcescens NOT DETECTED NOT DETECTED   Carbapenem resistance NOT DETECTED NOT DETECTED   Haemophilus influenzae NOT DETECTED NOT DETECTED   Neisseria meningitidis NOT DETECTED NOT DETECTED   Pseudomonas aeruginosa NOT DETECTED NOT DETECTED   Candida albicans NOT DETECTED NOT DETECTED   Candida glabrata NOT DETECTED NOT DETECTED   Candida krusei NOT DETECTED NOT DETECTED   Candida parapsilosis NOT DETECTED NOT DETECTED   Candida tropicalis NOT DETECTED NOT DETECTED    Name of physician (or Provider) Contacted: Dr. Sherral Hammers  Changes to prescribed antibiotics required: With GNR in 1/2 blood cultures, BCID with E Coli. Pt on levaquin and flagyl. Recommend change to rocephin 2g q24, d/c levaquin, leave on flagyl as is. Per Dr. Sherral Hammers - he will review the patient first and then make changes and consult pharmacy for antibiotic changes as appropriate.   Carlean Jews,  Pharm.D. PGY1 Pharmacy Resident 12/29/20178:59 AM Pager [phone]

## 2016-09-22 DIAGNOSIS — K830 Cholangitis: Secondary | ICD-10-CM

## 2016-09-22 DIAGNOSIS — J439 Emphysema, unspecified: Secondary | ICD-10-CM

## 2016-09-22 DIAGNOSIS — R17 Unspecified jaundice: Secondary | ICD-10-CM

## 2016-09-22 DIAGNOSIS — Z950 Presence of cardiac pacemaker: Secondary | ICD-10-CM

## 2016-09-22 LAB — CBC
HCT: 35.5 % — ABNORMAL LOW (ref 39.0–52.0)
Hemoglobin: 11.9 g/dL — ABNORMAL LOW (ref 13.0–17.0)
MCH: 31.1 pg (ref 26.0–34.0)
MCHC: 33.5 g/dL (ref 30.0–36.0)
MCV: 92.7 fL (ref 78.0–100.0)
PLATELETS: 225 10*3/uL (ref 150–400)
RBC: 3.83 MIL/uL — ABNORMAL LOW (ref 4.22–5.81)
RDW: 13.6 % (ref 11.5–15.5)
WBC: 8.1 10*3/uL (ref 4.0–10.5)

## 2016-09-22 LAB — COMPREHENSIVE METABOLIC PANEL
ALK PHOS: 176 U/L — AB (ref 38–126)
ALT: 156 U/L — AB (ref 17–63)
AST: 153 U/L — ABNORMAL HIGH (ref 15–41)
Albumin: 2.3 g/dL — ABNORMAL LOW (ref 3.5–5.0)
Anion gap: 9 (ref 5–15)
BILIRUBIN TOTAL: 1.1 mg/dL (ref 0.3–1.2)
BUN: 12 mg/dL (ref 6–20)
CALCIUM: 8.4 mg/dL — AB (ref 8.9–10.3)
CO2: 18 mmol/L — ABNORMAL LOW (ref 22–32)
CREATININE: 1.16 mg/dL (ref 0.61–1.24)
Chloride: 112 mmol/L — ABNORMAL HIGH (ref 101–111)
GFR, EST NON AFRICAN AMERICAN: 55 mL/min — AB (ref >60)
Glucose, Bld: 151 mg/dL — ABNORMAL HIGH (ref 65–99)
Potassium: 3.3 mmol/L — ABNORMAL LOW (ref 3.5–5.1)
Sodium: 139 mmol/L (ref 135–145)
TOTAL PROTEIN: 5.1 g/dL — AB (ref 6.5–8.1)

## 2016-09-22 LAB — GLUCOSE, CAPILLARY
GLUCOSE-CAPILLARY: 108 mg/dL — AB (ref 65–99)
GLUCOSE-CAPILLARY: 115 mg/dL — AB (ref 65–99)
Glucose-Capillary: 127 mg/dL — ABNORMAL HIGH (ref 65–99)
Glucose-Capillary: 136 mg/dL — ABNORMAL HIGH (ref 65–99)
Glucose-Capillary: 155 mg/dL — ABNORMAL HIGH (ref 65–99)

## 2016-09-22 LAB — MAGNESIUM: MAGNESIUM: 1.7 mg/dL (ref 1.7–2.4)

## 2016-09-22 MED ORDER — METOPROLOL TARTRATE 5 MG/5ML IV SOLN
5.0000 mg | Freq: Three times a day (TID) | INTRAVENOUS | Status: DC
  Administered 2016-09-22 – 2016-09-24 (×6): 5 mg via INTRAVENOUS
  Filled 2016-09-22 (×6): qty 5

## 2016-09-22 MED ORDER — LORAZEPAM 2 MG/ML IJ SOLN
0.5000 mg | Freq: Four times a day (QID) | INTRAMUSCULAR | Status: DC | PRN
  Filled 2016-09-22: qty 1

## 2016-09-22 MED ORDER — LORAZEPAM 2 MG/ML IJ SOLN
2.0000 mg | Freq: Once | INTRAMUSCULAR | Status: AC
  Administered 2016-09-22: 2 mg via INTRAVENOUS
  Filled 2016-09-22: qty 1

## 2016-09-22 MED ORDER — HYDRALAZINE HCL 20 MG/ML IJ SOLN
5.0000 mg | Freq: Once | INTRAMUSCULAR | Status: AC
  Administered 2016-09-22: 5 mg via INTRAVENOUS
  Filled 2016-09-22: qty 1

## 2016-09-22 MED ORDER — SODIUM CHLORIDE 0.9 % IV SOLN
30.0000 meq | Freq: Two times a day (BID) | INTRAVENOUS | Status: AC
  Administered 2016-09-22 (×2): 30 meq via INTRAVENOUS
  Filled 2016-09-22 (×3): qty 15

## 2016-09-22 MED ORDER — MAGNESIUM SULFATE 2 GM/50ML IV SOLN
2.0000 g | Freq: Once | INTRAVENOUS | Status: AC
  Administered 2016-09-22: 2 g via INTRAVENOUS
  Filled 2016-09-22: qty 50

## 2016-09-22 NOTE — Progress Notes (Signed)
[facility] Progress Note   (covering weekend for Dr Benson Norway)  Chief Complaint: cholangitis  Subjective  History:  Patient cannot give Hx or ROS b/c stuporous after sedation.  Nsg reports he was agitated earlier and pulling at tubes/lines. IR was able to successfully place PTC (internal/external) yesterday, and WBC has fallen with that and Abx.  Objective:  Med list reviewed  Vital signs in last 24 hrs: Vitals:   09/22/16 0400 09/22/16 1001  BP: 132/75 (!) 77/50  Pulse: 96 90  Resp: (!) 30 20  Temp: 99.1 F (37.3 C)     Physical Exam   Stuporous, in restraints  Cardiac: RRR without murmurs, S1S2 heard,  Pulm: clear to auscultation bilaterally, normal RR and effort noted  Abdomen: soft, obese with active bowel sounds. No guarding with palpation  Skin; warm and dry, no jaundice or rash  Recent Labs:   Recent Labs Lab 09/20/16 1550 09/21/16 0432 09/22/16 0925  WBC 16.0* 15.5* 8.1  HGB 14.4 12.3* 11.9*  HCT 42.1 36.5* 35.5*  PLT 228 249 225    Recent Labs Lab 09/21/16 0853  NA 140  K 3.9  CL 111  CO2 20*  BUN 14  ALBUMIN 2.2*  ALKPHOS 189*  ALT 232*  AST 403*  GLUCOSE 131*    Recent Labs Lab 09/21/16 0109  INR 1.06   Bcx positive for E coli  Radiologic studies:  IR report reviewed  '@ASSESSMENTPLANBEGIN'$ @ Assessment:  Cholangitis with E coli bacteremia  Jaundice from occluded biliary stent   Plan:  Continue drainage and antibiotics No further GI recs now.  I will only see again through Jan 1st if called.  Dr Benson Norway will return Jan 2nd.  Nelida Meuse III Pager [phone] Mon-Fri 8a-5p [phone] after 5p, weekends, holidays

## 2016-09-22 NOTE — Progress Notes (Signed)
SLP Cancellation Note  Patient Details Name: [name] MRN: [id number] DOB: 1927-01-17   Cancelled treatment:       Reason Eval/Treat Not Completed: Patient not medically ready. Pts arousal not appropriate for PO at this time. Will f/u in am.    Jordon Bourquin, Katherene Ponto 09/22/2016, 2:58 PM

## 2016-09-22 NOTE — Progress Notes (Signed)
PROGRESS NOTE    [name]  [id number] DOB: Sep 25, 1926 DOA: 09/20/2016 PCP: Stephens Shire, MD   Brief Narrative:  82 y.o. 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 As per patient's wife patient was getting weak over the last 2 days and today while walking to the dining table patient's slumped to the. Did not hit his head or lose consciousness. In the ER patient was found to be hypotensive with labs showing markedly elevated LFTs and Lactate. Patient has known history of CBD stones with recurrent obstruction. Has had stents placed previously. CT abdomen and pelvis was done which shows dilated duct with infrarenal aortic aneurysm. Dr. Benson Norway, patient's gastroenterologist was consulted and since patient has difficult ERCP previously IR has been consulted for drain placement and has been placed by the time I examined the patient. On my exam patient appears confused and moves all extremities.   Subjective: 12/30  A/O 0 patient asleep secondary to medication.     Assessment & Plan:   Principal Problem:   Sepsis (Saxis) Active Problems:   Coronary artery disease   DM2 (diabetes mellitus, type 2) (HCC)   Systolic CHF, chronic (HCC)   COPD with emphysema (HCC)   Pacemaker-St.Jude   Acute cholangitis   Acute encephalopathy   CAD in native artery   AV block, Mobitz II   Uncontrolled type 2 diabetes mellitus with complication (HCC)  Sepsis/Cholangitis -S/P IR placement Biliary drain  -Continue current antibiotics  Acute on Chronic Encephalopathy -Per wife patient A/O 1 most of the time - CT head was unremarkable -Haldol or Ativan PRN for behavioral disturbances  Chronic Diastolic CHF -Strict I&O since admission +3.6 L -Daily weight Filed Weights   09/21/16 0000  09/22/16 0400  Weight: 86.4 kg (190 lb 7.6 oz) 85.9 kg (189 lb 6 oz)  -start Metoprolol IV 5 mg TID (home dose 50 mg ).  CAD S/P stenting -Asymptomatic continue to monitor  2d degree AV block Mobitz type II S/P Pacemaker placement.  DM type II uncontrolled with complications -7/4 Hemoglobin A1c= 7.9 -Sensitive SSI  Hypokalemia -Potassium goal> -Potassium IV 30 mEq 2  Hypomagnesemia -Magnesium goal> 2 -Magnesium IV 2 g    Goals of care -PT/OT consult pending   DVT prophylaxis: SCD Code Status: DO NOT RESUSCITATE Family Communication: Wife present at bedside for discussion of plan of care Disposition Plan: SNF vs home   Consultants:  NA  Procedures/Significant Events:  12/28 CT head WO contrast:-Negative acute infarct -Generalized cerebral volume loss and moderate chronic small vessel ischemia.   VENTILATOR SETTINGS:    Cultures 1/28 urine pending 12/28 blood 2 positive Escherichia coli/Enterobacteriaceae   Antimicrobials: Anti-infectives    Start     Stop   09/21/16 1200  levofloxacin (LEVAQUIN) IVPB 500 mg  Status:  Discontinued     09/21/16 1143   09/21/16 1145  cefTRIAXone (ROCEPHIN) 2 g in dextrose 5 % 50 mL IVPB         09/21/16 0800  metroNIDAZOLE (FLAGYL) IVPB 500 mg         09/21/16 0000  levofloxacin (LEVAQUIN) IVPB 750 mg  Status:  Discontinued     09/21/16 0005   09/21/16 0000  metroNIDAZOLE (FLAGYL) IVPB 500 mg     09/21/16 0302  09/20/16 1700  metroNIDAZOLE (FLAGYL) IVPB 500 mg     09/20/16 1815   09/20/16 1630  cefTRIAXone (ROCEPHIN) 1 g in dextrose 5 % 50 mL IVPB     09/20/16 1658   09/20/16 1630  azithromycin (ZITHROMAX) 500 mg in dextrose 5 % 250 mL IVPB     09/20/16 1727       Devices    LINES / TUBES:      Continuous Infusions:    Objective: Vitals:   09/21/16 1614 09/21/16 1957 09/21/16 2355 09/22/16 0400  BP: (!) 124/121 (!) 155/86 138/75 132/75  Pulse:  98 (!) 120 96  Resp:  (!) 28 (!) 30 (!) 30  Temp:  98.1 F (36.7 C) 97.7 F (36.5 C) 99.3 F (37.4 C) 99.1 F (37.3 C)  TempSrc: Oral Oral Axillary Axillary  SpO2:  97% 97% 98%  Weight:    85.9 kg (189 lb 6 oz)  Height:        Intake/Output Summary (Last 24 hours) at 09/22/16 0854 Last data filed at 09/22/16 0645  Gross per 24 hour  Intake          3796.67 ml  Output             1425 ml  Net          2371.67 ml   Filed Weights   09/21/16 0000 09/22/16 0400  Weight: 86.4 kg (190 lb 7.6 oz) 85.9 kg (189 lb 6 oz)    Examination:  General:  A/O 0 patient asleep secondary to medication. No acute respiratory distress Eyes: negative scleral hemorrhage, negative anisocoria, negative icterus ENT: Negative Runny nose, negative gingival bleeding, Neck:  Negative scars, masses, torticollis, lymphadenopathy, JVD Lungs: Clear to auscultation bilaterally without wheezes or crackles Cardiovascular: Regular rate and rhythm without murmur gallop or rub normal S1 and S2 Abdomen: Obese, negative abdominal pain, nondistended, positive soft, bowel sounds, no rebound, no ascites, no appreciable mass, right biliary drain present (draining significant amount of brownish discharge) Extremities: No significant cyanosis, clubbing, or edema bilateral lower extremities Skin: Negative rashes, lesions, ulcers Psychiatric:  Unable to fully evaluate secondary to dementia Central nervous system:  Cranial nerves II through XII intact, tongue/uvula midline, all extremities muscle strength 5/5, sensation intact throughout,  negative dysarthria, negative expressive aphasia, negative receptive aphasia.  .     Data Reviewed: Care during the described time interval was provided by me .  I have reviewed this patient's available data, including medical history, events of note, physical examination, and all test results as part of my evaluation. I have personally reviewed and interpreted all radiology studies.  CBC:  Recent Labs Lab 09/20/16 1550 09/21/16 0432    WBC 16.0* 15.5*  NEUTROABS 14.8* 12.1*  HGB 14.4 12.3*  HCT 42.1 36.5*  MCV 93.3 93.8  PLT 228 0000000   Basic Metabolic Panel:  Recent Labs Lab 09/20/16 1550 09/21/16 0853  NA 135 140  K 4.0 3.9  CL 100* 111  CO2 22 20*  GLUCOSE 273* 131*  BUN 17 14  CREATININE 1.49* 1.23  CALCIUM 8.8* 7.9*   GFR: Estimated Creatinine Clearance (by C-G formula based on SCr of 1.23 mg/dL) Male: 35.6 mL/min Male: 43.4 mL/min Liver Function Tests:  Recent Labs Lab 09/20/16 1550 09/21/16 0853  AST 964* 403*  ALT 308* 232*  ALKPHOS 314* 189*  BILITOT 3.5* 3.0*  PROT 5.8* 4.5*  ALBUMIN 2.8* 2.2*   No results for input(s): LIPASE, AMYLASE in the last 168 hours.  No results for input(s): AMMONIA in the last 168 hours. Coagulation Profile:  Recent Labs Lab 09/20/16 2029 09/21/16 0109  INR 1.23 1.06   Cardiac Enzymes: No results for input(s): CKTOTAL, CKMB, CKMBINDEX, TROPONINI in the last 168 hours. BNP (last 3 results) No results for input(s): PROBNP in the last 8760 hours. HbA1C: No results for input(s): HGBA1C in the last 72 hours. CBG:  Recent Labs Lab 09/21/16 1612 09/21/16 1956 09/21/16 2353 09/22/16 0413 09/22/16 0831  GLUCAP 134* 127* 104* 136* 155*   Lipid Profile: No results for input(s): CHOL, HDL, LDLCALC, TRIG, CHOLHDL, LDLDIRECT in the last 72 hours. Thyroid Function Tests: No results for input(s): TSH, T4TOTAL, FREET4, T3FREE, THYROIDAB in the last 72 hours. Anemia Panel: No results for input(s): VITAMINB12, FOLATE, FERRITIN, TIBC, IRON, RETICCTPCT in the last 72 hours. Urine analysis:    Component Value Date/Time   COLORURINE AMBER (A) 09/20/2016 1546   APPEARANCEUR HAZY (A) 09/20/2016 1546   LABSPEC 1.014 09/20/2016 1546   PHURINE 7.0 09/20/2016 1546   GLUCOSEU 150 (A) 09/20/2016 1546   HGBUR NEGATIVE 09/20/2016 1546   BILIRUBINUR SMALL (A) 09/20/2016 1546   KETONESUR NEGATIVE 09/20/2016 1546   PROTEINUR 100 (A) 09/20/2016 1546   UROBILINOGEN  2.0 (H) 06/14/2015 0840   NITRITE NEGATIVE 09/20/2016 1546   LEUKOCYTESUR NEGATIVE 09/20/2016 1546   Sepsis Labs: @LABRCNTIP (procalcitonin:4,lacticidven:4)  ) Recent Results (from the past 240 hour(s))  Urine culture     Status: Abnormal   Collection Time: 09/20/16  3:46 PM  Result Value Ref Range Status   Specimen Description URINE, CLEAN CATCH  Final   Special Requests NONE  Final   Culture MULTIPLE SPECIES PRESENT, SUGGEST RECOLLECTION (A)  Final   Report Status 09/21/2016 FINAL  Final  Blood Culture (routine x 2)     Status: None (Preliminary result)   Collection Time: 09/20/16  3:50 PM  Result Value Ref Range Status   Specimen Description BLOOD LEFT ANTECUBITAL  Final   Special Requests BOTTLES DRAWN AEROBIC AND ANAEROBIC 5CC  Final   Culture  Setup Time   Final    GRAM NEGATIVE RODS AEROBIC BOTTLE ONLY CRITICAL RESULT CALLED TO, READ BACK BY AND VERIFIED WITH: A JOHNSTON,PHARMD AT [id number] 09/21/16 BY L BENFIELD    Culture GRAM NEGATIVE RODS  Final   Report Status PENDING  Incomplete  Blood Culture ID Panel (Reflexed)     Status: Abnormal   Collection Time: 09/20/16  3:50 PM  Result Value Ref Range Status   Enterococcus species NOT DETECTED NOT DETECTED Final   Listeria monocytogenes NOT DETECTED NOT DETECTED Final   Staphylococcus species NOT DETECTED NOT DETECTED Final   Staphylococcus aureus NOT DETECTED NOT DETECTED Final   Streptococcus species NOT DETECTED NOT DETECTED Final   Streptococcus agalactiae NOT DETECTED NOT DETECTED Final   Streptococcus pneumoniae NOT DETECTED NOT DETECTED Final   Streptococcus pyogenes NOT DETECTED NOT DETECTED Final   Acinetobacter baumannii NOT DETECTED NOT DETECTED Final   Enterobacteriaceae species DETECTED (A) NOT DETECTED Final    Comment: CRITICAL RESULT CALLED TO, READ BACK BY AND VERIFIED WITH: A JOHNSTON,PHARMD AT 0841 09/21/16 BY L BENFIELD    Enterobacter cloacae complex NOT DETECTED NOT DETECTED Final   Escherichia coli  DETECTED (A) NOT DETECTED Final    Comment: CRITICAL RESULT CALLED TO, READ BACK BY AND VERIFIED WITH: A JOHNSTON,PHARMD AT 0841 09/21/16 BY L BENFIELD    Klebsiella oxytoca NOT DETECTED NOT DETECTED Final   Klebsiella pneumoniae NOT DETECTED NOT DETECTED  Final   Proteus species NOT DETECTED NOT DETECTED Final   Serratia marcescens NOT DETECTED NOT DETECTED Final   Carbapenem resistance NOT DETECTED NOT DETECTED Final   Haemophilus influenzae NOT DETECTED NOT DETECTED Final   Neisseria meningitidis NOT DETECTED NOT DETECTED Final   Pseudomonas aeruginosa NOT DETECTED NOT DETECTED Final   Candida albicans NOT DETECTED NOT DETECTED Final   Candida glabrata NOT DETECTED NOT DETECTED Final   Candida krusei NOT DETECTED NOT DETECTED Final   Candida parapsilosis NOT DETECTED NOT DETECTED Final   Candida tropicalis NOT DETECTED NOT DETECTED Final  Blood Culture (routine x 2)     Status: None (Preliminary result)   Collection Time: 09/20/16  3:55 PM  Result Value Ref Range Status   Specimen Description BLOOD RIGHT ANTECUBITAL  Final   Special Requests BOTTLES DRAWN AEROBIC AND ANAEROBIC 5CC  Final   Culture NO GROWTH < 24 HOURS  Final   Report Status PENDING  Incomplete  MRSA PCR Screening     Status: None   Collection Time: 09/21/16 12:27 AM  Result Value Ref Range Status   MRSA by PCR NEGATIVE NEGATIVE Final    Comment:        The GeneXpert MRSA Assay (FDA approved for NASAL specimens only), is one component of a comprehensive MRSA colonization surveillance program. It is not intended to diagnose MRSA infection nor to guide or monitor treatment for MRSA infections.          Radiology Studies: Ct Head Wo Contrast  Result Date: 09/20/2016 CLINICAL DATA:  Unwitnessed fall earlier today. Altered mental status. Dementia. EXAM: CT HEAD WITHOUT CONTRAST TECHNIQUE: Contiguous axial images were obtained from the base of the skull through the vertex without intravenous contrast.  COMPARISON:  05/31/2016 head CT . FINDINGS: Brain: No evidence of parenchymal hemorrhage or extra-axial fluid collection. No mass lesion, mass effect, or midline shift. No CT evidence of acute infarction. Intracranial atherosclerosis. Generalized cerebral volume loss. Nonspecific moderate subcortical and periventricular white matter hypodensity, most in keeping with chronic small vessel ischemic change. No ventriculomegaly. Vascular: No hyperdense vessel or unexpected calcification. Skull: No evidence of calvarial fracture. Sinuses/Orbits: The visualized paranasal sinuses are essentially clear. Other:  The mastoid air cells are unopacified. IMPRESSION: 1. No evidence of acute intracranial abnormality. No evidence of calvarial fracture. 2. Generalized cerebral volume loss and moderate chronic small vessel ischemia. Electronically Signed   By: Ilona Sorrel M.D.   On: 09/20/2016 18:46   Ct Abdomen Pelvis W Contrast  Result Date: 09/20/2016 CLINICAL DATA:  82 year old male with dementia presents after unwitnessed fall with elevated liver function tests. History of ERCP with stone extraction and biliary stent placement 07/09/2014. EXAM: CT ABDOMEN AND PELVIS WITH CONTRAST TECHNIQUE: Multidetector CT imaging of the abdomen and pelvis was performed using the standard protocol following bolus administration of intravenous contrast. CONTRAST:  75 cc ISOVUE-300 IOPAMIDOL (ISOVUE-300) INJECTION 61% COMPARISON:  03/26/2016 CT abdomen/ pelvis. Right upper quadrant abdominal sonogram from 03/29/2016. FINDINGS: Lower chest: Right lower lobe 5 mm solid pulmonary nodule (series 6/image 1), stable since 05/03/2010 and considered benign. Pacemaker leads are seen terminating in the right atrium and right ventricular apex. Coronary atherosclerosis. Hepatobiliary: Diffuse hepatic steatosis. No liver mass. Cholecystectomy. Stable pneumobilia in the left liver lobe. Stable mild central intrahepatic biliary ductal dilatation. Stable  common bile duct diameter 16 mm. Biliary stent is in place in the lower third of the common bile duct. Pancreas: Stable nonspecific mild ectasia of the ventral pancreatic duct in  the pancreatic head, maximum diameter 5 mm. No pancreatic mass. No peripancreatic fluid collections. Spleen: Normal size. No mass. Adrenals/Urinary Tract: Normal adrenals. No hydronephrosis. Simple 1.2 cm renal cyst in the interpolar right kidney. Additional subcentimeter hypodense renal cortical lesions in both kidneys are too small to characterize and appear unchanged. Normal bladder. Stomach/Bowel: Grossly normal stomach. Normal caliber small bowel with no small bowel wall thickening. Normal appendix . Mild sigmoid diverticulosis, with no large bowel wall thickening or pericolonic fat stranding. Vascular/Lymphatic: Abdominal aortic atherosclerosis. Stable 3.6 cm infrarenal abdominal aortic aneurysm. Patent portal, splenic, hepatic and renal veins. No pathologically enlarged lymph nodes in the abdomen or pelvis. Reproductive: Normal size prostate. Nonspecific coarse internal prostatic calcifications are stable. Other: No pneumoperitoneum, ascites or focal fluid collection. Stable small fat containing left inguinal hernia. Musculoskeletal: No aggressive appearing focal osseous lesions. No fracture. Moderate thoracolumbar spondylosis. IMPRESSION: 1. No acute traumatic injury or other acute abnormality in the abdomen or pelvis. 2. Diffuse hepatic steatosis.  No liver mass. 3. Status post cholecystectomy. Stable biliary ductal dilatation with common bile duct diameter 16 mm. Stable well-positioned biliary stent in the lower third of the common bile duct with expected pneumobilia. 4. Aortic atherosclerosis. Stable 3.6 cm infrarenal abdominal aortic aneurysm. Recommend followup by ultrasound in 2 years. This recommendation follows ACR consensus guidelines: White Paper of the ACR Incidental Findings Committee II on Vascular Findings. J Am Coll  Radiol 2013; 10:789-794. 5. Coronary atherosclerosis. 6. Mild sigmoid diverticulosis. 7. Stable small fat containing left inguinal hernia. Electronically Signed   By: Ilona Sorrel M.D.   On: 09/20/2016 18:21   Dg Chest Port 1 View  Result Date: 09/20/2016 CLINICAL DATA:  Fever.  The patient fell. EXAM: PORTABLE CHEST 1 VIEW COMPARISON:  03/26/2016 FINDINGS: Heart size and pulmonary vascularity are normal and the lungs are clear. Pacemaker in place. No acute bone abnormality. IMPRESSION: No acute abnormalities. Electronically Signed   By: Lorriane Shire M.D.   On: 09/20/2016 16:14   Ir Int Lianne Cure Biliary Drain With Cholangiogram  Result Date: 09/20/2016 INDICATION: 82 year old male with acute cholangitis with a possibly obstructed biliary stent. EXAM: IR INT-EXT BILIARY DRAIN W/ CHOLANGIOGRAM MEDICATIONS: Patient recently received intravenous Rocephin, azithromycin and Flagyl while in the emergency room. No additional antibiotic prophylaxis administered. ANESTHESIA/SEDATION: Moderate (conscious) sedation was employed during this procedure. A total of Versed 1.5 mg and Fentanyl 125 mcg was administered intravenously. Moderate Sedation Time: 54 minutes. [name] level of consciousness and vital signs were monitored continuously by radiology nursing throughout the procedure under my direct supervision. FLUOROSCOPY TIME:  Fluoroscopy Time: 28 minutes 30 seconds (851 mGy). COMPLICATIONS: None immediate. PROCEDURE: Informed written consent was obtained from the patient after a thorough discussion of the procedural risks, benefits and alternatives. All questions were addressed. Maximal Sterile Barrier Technique was utilized including caps, mask, sterile gowns, sterile gloves, sterile drape, hand hygiene and skin antiseptic. A timeout was performed prior to the initiation of the procedure. The liver was interrogated with ultrasound. There is no significant intrahepatic biliary ductal dilatation outside of the  hilum. A Chiba needle was advanced from a lateral approach at the level of the mid axillary line. The needle was advanced of the hepatic parenchyma in then slowly withdrawn well injecting contrast material and still a of biliary radicle was then captured. A percutaneous transhepatic cholangiogram was then performed. There is significant dilatation of the common bile duct and central hepatic ducts. The more peripheral hepatic ducts are decompressed. The biliary stent is  occluded, however a small amount of contrast material passes adjacent to the biliary stent and into the duodenum. A night tracks wire was successfully navigated into the biliary tree. The GDA needle was then exchanged for the Accustick sheath. A gentle hand injection of contrast material under fluoroscopy confirmed sheath location within the dilated common bile duct. A 4 French angled glide catheter was advanced coaxially through the Accustick sheath an successfully navigated adjacent to the stent and into the duodenum overall wire. An Amplatz wire was then advanced into the duodenum. The Accustick sheath and 4 French catheter were removed. The skin tract was dilated to 10 [place] and a Cook 10.2 [place] biliary drainage catheter was advanced over the wire and formed with the locking loop in the duodenum. Hand injection of contrast material confirms final tube placement. Overall, the patient tolerated the procedure well. IMPRESSION: 1. Percutaneous transhepatic cholangiogram demonstrates an occluded stent in the common bile duct. There is slow passage of a small amount of contrast material adjacent to the stent, through the ampulla and into the duodenum. 2. There is significant dilatation of the common hepatic and common bile ducts. 3. Successful placement of a 10.2 French internal/external biliary drainage catheter which passes adjacent to the occluded stent. Signed, Criselda Peaches, MD [facility]  Radiology Electronically Signed   By: Jacqulynn Cadet M.D.   On: 09/20/2016 22:06        Scheduled Meds: . cefTRIAXone (ROCEPHIN)  IV  2 g Intravenous Q24H  . fluticasone furoate-vilanterol  1 puff Inhalation Daily  . insulin aspart  0-9 Units Subcutaneous Q4H  . metronidazole  500 mg Intravenous Q8H   Continuous Infusions:    LOS: 2 days    Time spent: 40 minutes    WOODS, Geraldo Docker, MD [facility] Pager [phone]   If 7PM-7AM, please contact night-coverage [email or web address] Password [email or web address] 09/22/2016, 8:54 AM

## 2016-09-22 NOTE — Progress Notes (Signed)
PT Cancellation Note  Patient Details Name: [name] MRN: [id number] DOB: 07/13/1927   Cancelled Treatment:    Reason Eval/Treat Not Completed: Other (comment) Pt's nurse requesting that therapist return another time to attempt PT evaluation. PT will continue to f/u with pt as appropriate and available.   Clearnce Sorrel Mishka Stegemann 09/22/2016, 1:50 PM

## 2016-09-23 ENCOUNTER — Inpatient Hospital Stay (HOSPITAL_COMMUNITY): Payer: Medicare HMO

## 2016-09-23 DIAGNOSIS — I5032 Chronic diastolic (congestive) heart failure: Secondary | ICD-10-CM

## 2016-09-23 DIAGNOSIS — F0281 Dementia in other diseases classified elsewhere with behavioral disturbance: Secondary | ICD-10-CM

## 2016-09-23 DIAGNOSIS — K8309 Other cholangitis: Secondary | ICD-10-CM

## 2016-09-23 DIAGNOSIS — F02818 Dementia in other diseases classified elsewhere, unspecified severity, with other behavioral disturbance: Secondary | ICD-10-CM

## 2016-09-23 LAB — CULTURE, BLOOD (ROUTINE X 2)

## 2016-09-23 LAB — GLUCOSE, CAPILLARY
GLUCOSE-CAPILLARY: 133 mg/dL — AB (ref 65–99)
GLUCOSE-CAPILLARY: 135 mg/dL — AB (ref 65–99)
GLUCOSE-CAPILLARY: 166 mg/dL — AB (ref 65–99)
Glucose-Capillary: 126 mg/dL — ABNORMAL HIGH (ref 65–99)
Glucose-Capillary: 170 mg/dL — ABNORMAL HIGH (ref 65–99)
Glucose-Capillary: 127 mg/dL — ABNORMAL HIGH (ref 65–99)

## 2016-09-23 MED ORDER — MAGNESIUM SULFATE 2 GM/50ML IV SOLN
2.0000 g | Freq: Once | INTRAVENOUS | Status: AC
  Administered 2016-09-23: 2 g via INTRAVENOUS
  Filled 2016-09-23: qty 50

## 2016-09-23 MED ORDER — HALOPERIDOL LACTATE 5 MG/ML IJ SOLN: 7.0000 mg | Freq: Four times a day (QID) | INTRAMUSCULAR | Status: DC

## 2016-09-23 MED ORDER — SODIUM CHLORIDE 0.9 % IV SOLN
30.0000 meq | Freq: Two times a day (BID) | INTRAVENOUS | Status: AC
  Administered 2016-09-23 (×2): 30 meq via INTRAVENOUS
  Filled 2016-09-23 (×3): qty 15

## 2016-09-23 MED ORDER — RESOURCE THICKENUP CLEAR PO POWD
ORAL | Status: DC | PRN
  Filled 2016-09-23: qty 125

## 2016-09-23 NOTE — Plan of Care (Signed)
Problem: Safety: Goal: Ability to remain free from injury will improve Outcome: Not Progressing Pt is still impulsive and agitated and attempting to interfere with medical equipment

## 2016-09-23 NOTE — Progress Notes (Signed)
Modified Barium Swallow Progress Note  Patient Details  Name: [name] MRN: [id number] Date of Birth: [birth year]  Today's Date: 09/23/2016  Modified Barium Swallow completed.  Full report located under Chart Review in the Imaging Section.  Brief recommendations include the following:  Clinical Impression  Pt demonstrates severe dysphagia with sensorimotor deficits. Pt is significantly dysarthric with discoordinated articulation pattern; coordination of musculature for swallowing is also impaired with poor lingual manipulation and timing of swallow initiation. Pt rapidly drinks liquids and propels this posteriorly with little bolus formation or control. Thin and nectar thick liquids are grossly, silently aspirated before the swallow. Controlling/reducing bolus size is effective to eliminate aspiration events with nectar, but pts mentation and hearing are barriers to ability to follow commands for safe intake. Recommend a relatively conservative, restrictive diet for now (dys 1/honey thick) until pt can demonstrate slow rate and small cup sips with nectar and adequate manipulation of solids. SLP will f/u for tolerance and advancement.    Swallow Evaluation Recommendations       SLP Diet Recommendations: Dysphagia 1 (Puree) solids;Honey thick liquids   Liquid Administration via: Cup;Spoon   Medication Administration: Crushed with puree   Supervision: Staff to assist with self feeding   Compensations: Slow rate;Small sips/bites       Oral Care Recommendations: Oral care BID   Other Recommendations: Order thickener from pharmacy    Miyoko Hashimi, Katherene Ponto 09/23/2016,12:30 PM

## 2016-09-23 NOTE — Progress Notes (Signed)
Referring Physician(s): Woods,C/Hung,P  Supervising Physician: Aletta Edouard  Patient Status:  [facility] - In-pt  Chief Complaint: Cholangitis, biliary obstruction   Subjective: Patient without acute changes. Still dysarthric. Wife in the room. States he has some intermittent soreness in the right upper quadrant. Going down for swallow evaluation now.    Allergies: Doxycycline and Penicillins  Medications: Prior to Admission medications   Medication Sig Start Date End Date Taking? Authorizing Provider  aspirin 325 MG tablet Take 162.5 mg by mouth daily.    Yes Historical Provider, MD  fluticasone furoate-vilanterol (BREO ELLIPTA) 100-25 MCG/INH AEPB Inhale 1 puff into the lungs daily. 12/21/15  Yes Chesley Mires, MD  folic acid (FOLVITE) 1 MG tablet Take 1 mg by mouth daily.   Yes Historical Provider, MD  hydrochlorothiazide (HYDRODIURIL) 25 MG tablet Take 12.5 mg by mouth every morning.    Yes Historical Provider, MD  losartan (COZAAR) 50 MG tablet Take 50 mg by mouth every morning.   Yes Historical Provider, MD  MAGNESIUM SULFATE PO Take 1 tablet by mouth daily.   Yes Historical Provider, MD  metoprolol (LOPRESSOR) 50 MG tablet Take 1 tablet (50 mg total) by mouth 2 (two) times daily. 10/14/15  Yes [name], MD  Multiple Vitamins-Minerals (CENTRUM SILVER PO) Take 1 tablet by mouth every morning.    Yes Historical Provider, MD  nitroGLYCERIN (NITROSTAT) 0.4 MG SL tablet Place 0.4 mg under the tongue every 5 (five) minutes x 3 doses as needed for chest pain.    Yes Historical Provider, MD  Omega-3 Fatty Acids (FISH OIL PO) Take 2 capsules by mouth daily.   Yes Historical Provider, MD  omeprazole (PRILOSEC) 20 MG capsule Take 20 mg by mouth daily before breakfast.  03/01/15  Yes Historical Provider, MD  polyethylene glycol (MIRALAX / GLYCOLAX) packet Take 17 g by mouth daily.   Yes Historical Provider, MD  simvastatin (ZOCOR) 20 MG tablet Take 20 mg by mouth every evening.    Yes  Historical Provider, MD     Vital Signs: BP (!) 140/116 (BP Location: Right Arm)   Pulse (!) 120   Temp 99 F (37.2 C) (Axillary)   Resp (!) 27   Ht 5\' 6"  (1.676 m)   Wt 184 lb 11.9 oz (83.8 kg)   SpO2 92%   BMI 29.82 kg/m   Physical Exam awake; speech dysarthric; biliary drain intact, insertion site okay, site mildly tender. Output 650 mL green bile. Abdomen soft. Positive bowel sounds.  Imaging: Ct Head Wo Contrast  Result Date: 09/20/2016 CLINICAL DATA:  Unwitnessed fall earlier today. Altered mental status. Dementia. EXAM: CT HEAD WITHOUT CONTRAST TECHNIQUE: Contiguous axial images were obtained from the base of the skull through the vertex without intravenous contrast. COMPARISON:  05/31/2016 head CT . FINDINGS: Brain: No evidence of parenchymal hemorrhage or extra-axial fluid collection. No mass lesion, mass effect, or midline shift. No CT evidence of acute infarction. Intracranial atherosclerosis. Generalized cerebral volume loss. Nonspecific moderate subcortical and periventricular white matter hypodensity, most in keeping with chronic small vessel ischemic change. No ventriculomegaly. Vascular: No hyperdense vessel or unexpected calcification. Skull: No evidence of calvarial fracture. Sinuses/Orbits: The visualized paranasal sinuses are essentially clear. Other:  The mastoid air cells are unopacified. IMPRESSION: 1. No evidence of acute intracranial abnormality. No evidence of calvarial fracture. 2. Generalized cerebral volume loss and moderate chronic small vessel ischemia. Electronically Signed   By: Ilona Sorrel M.D.   On: 09/20/2016 18:46   Ct Abdomen  Pelvis W Contrast  Result Date: 09/20/2016 CLINICAL DATA:  82 year old male with dementia presents after unwitnessed fall with elevated liver function tests. History of ERCP with stone extraction and biliary stent placement 07/09/2014. EXAM: CT ABDOMEN AND PELVIS WITH CONTRAST TECHNIQUE: Multidetector CT imaging of the abdomen  and pelvis was performed using the standard protocol following bolus administration of intravenous contrast. CONTRAST:  75 cc ISOVUE-300 IOPAMIDOL (ISOVUE-300) INJECTION 61% COMPARISON:  03/26/2016 CT abdomen/ pelvis. Right upper quadrant abdominal sonogram from 03/29/2016. FINDINGS: Lower chest: Right lower lobe 5 mm solid pulmonary nodule (series 6/image 1), stable since 05/03/2010 and considered benign. Pacemaker leads are seen terminating in the right atrium and right ventricular apex. Coronary atherosclerosis. Hepatobiliary: Diffuse hepatic steatosis. No liver mass. Cholecystectomy. Stable pneumobilia in the left liver lobe. Stable mild central intrahepatic biliary ductal dilatation. Stable common bile duct diameter 16 mm. Biliary stent is in place in the lower third of the common bile duct. Pancreas: Stable nonspecific mild ectasia of the ventral pancreatic duct in the pancreatic head, maximum diameter 5 mm. No pancreatic mass. No peripancreatic fluid collections. Spleen: Normal size. No mass. Adrenals/Urinary Tract: Normal adrenals. No hydronephrosis. Simple 1.2 cm renal cyst in the interpolar right kidney. Additional subcentimeter hypodense renal cortical lesions in both kidneys are too small to characterize and appear unchanged. Normal bladder. Stomach/Bowel: Grossly normal stomach. Normal caliber small bowel with no small bowel wall thickening. Normal appendix . Mild sigmoid diverticulosis, with no large bowel wall thickening or pericolonic fat stranding. Vascular/Lymphatic: Abdominal aortic atherosclerosis. Stable 3.6 cm infrarenal abdominal aortic aneurysm. Patent portal, splenic, hepatic and renal veins. No pathologically enlarged lymph nodes in the abdomen or pelvis. Reproductive: Normal size prostate. Nonspecific coarse internal prostatic calcifications are stable. Other: No pneumoperitoneum, ascites or focal fluid collection. Stable small fat containing left inguinal hernia. Musculoskeletal: No  aggressive appearing focal osseous lesions. No fracture. Moderate thoracolumbar spondylosis. IMPRESSION: 1. No acute traumatic injury or other acute abnormality in the abdomen or pelvis. 2. Diffuse hepatic steatosis.  No liver mass. 3. Status post cholecystectomy. Stable biliary ductal dilatation with common bile duct diameter 16 mm. Stable well-positioned biliary stent in the lower third of the common bile duct with expected pneumobilia. 4. Aortic atherosclerosis. Stable 3.6 cm infrarenal abdominal aortic aneurysm. Recommend followup by ultrasound in 2 years. This recommendation follows ACR consensus guidelines: White Paper of the ACR Incidental Findings Committee II on Vascular Findings. J Am Coll Radiol 2013; 10:789-794. 5. Coronary atherosclerosis. 6. Mild sigmoid diverticulosis. 7. Stable small fat containing left inguinal hernia. Electronically Signed   By: Ilona Sorrel M.D.   On: 09/20/2016 18:21   Dg Chest Port 1 View  Result Date: 09/20/2016 CLINICAL DATA:  Fever.  The patient fell. EXAM: PORTABLE CHEST 1 VIEW COMPARISON:  03/26/2016 FINDINGS: Heart size and pulmonary vascularity are normal and the lungs are clear. Pacemaker in place. No acute bone abnormality. IMPRESSION: No acute abnormalities. Electronically Signed   By: Lorriane Shire M.D.   On: 09/20/2016 16:14   Ir Int Lianne Cure Biliary Drain With Cholangiogram  Result Date: 09/20/2016 INDICATION: 82 year old male with acute cholangitis with a possibly obstructed biliary stent. EXAM: IR INT-EXT BILIARY DRAIN W/ CHOLANGIOGRAM MEDICATIONS: Patient recently received intravenous Rocephin, azithromycin and Flagyl while in the emergency room. No additional antibiotic prophylaxis administered. ANESTHESIA/SEDATION: Moderate (conscious) sedation was employed during this procedure. A total of Versed 1.5 mg and Fentanyl 125 mcg was administered intravenously. Moderate Sedation Time: 54 minutes. [name] level of consciousness and vital signs  were  monitored continuously by radiology nursing throughout the procedure under my direct supervision. FLUOROSCOPY TIME:  Fluoroscopy Time: 28 minutes 30 seconds (851 mGy). COMPLICATIONS: None immediate. PROCEDURE: Informed written consent was obtained from the patient after a thorough discussion of the procedural risks, benefits and alternatives. All questions were addressed. Maximal Sterile Barrier Technique was utilized including caps, mask, sterile gowns, sterile gloves, sterile drape, hand hygiene and skin antiseptic. A timeout was performed prior to the initiation of the procedure. The liver was interrogated with ultrasound. There is no significant intrahepatic biliary ductal dilatation outside of the hilum. A Chiba needle was advanced from a lateral approach at the level of the mid axillary line. The needle was advanced of the hepatic parenchyma in then slowly withdrawn well injecting contrast material and still a of biliary radicle was then captured. A percutaneous transhepatic cholangiogram was then performed. There is significant dilatation of the common bile duct and central hepatic ducts. The more peripheral hepatic ducts are decompressed. The biliary stent is occluded, however a small amount of contrast material passes adjacent to the biliary stent and into the duodenum. A night tracks wire was successfully navigated into the biliary tree. The GDA needle was then exchanged for the Accustick sheath. A gentle hand injection of contrast material under fluoroscopy confirmed sheath location within the dilated common bile duct. A 4 French angled glide catheter was advanced coaxially through the Accustick sheath an successfully navigated adjacent to the stent and into the duodenum overall wire. An Amplatz wire was then advanced into the duodenum. The Accustick sheath and 4 French catheter were removed. The skin tract was dilated to 10 [place] and a Cook 10.2 [place] biliary drainage catheter was advanced over the  wire and formed with the locking loop in the duodenum. Hand injection of contrast material confirms final tube placement. Overall, the patient tolerated the procedure well. IMPRESSION: 1. Percutaneous transhepatic cholangiogram demonstrates an occluded stent in the common bile duct. There is slow passage of a small amount of contrast material adjacent to the stent, through the ampulla and into the duodenum. 2. There is significant dilatation of the common hepatic and common bile ducts. 3. Successful placement of a 10.2 French internal/external biliary drainage catheter which passes adjacent to the occluded stent. Signed, Criselda Peaches, MD [facility] Radiology Electronically Signed   By: Jacqulynn Cadet M.D.   On: 09/20/2016 22:06    Labs:  CBC:  Recent Labs  03/30/16 0354 09/20/16 1550 09/21/16 0432 09/22/16 0925  WBC 9.9 16.0* 15.5* 8.1  HGB 13.3 14.4 12.3* 11.9*  HCT 40.2 42.1 36.5* 35.5*  PLT 294 228 249 225    COAGS:  Recent Labs  09/20/16 2029 09/21/16 0109  INR 1.23 1.06  APTT  --  25    BMP:  Recent Labs  03/30/16 0354 09/20/16 1550 09/21/16 0853 09/22/16 0925  NA 139 135 140 139  K 4.2 4.0 3.9 3.3*  CL 106 100* 111 112*  CO2 23 22 20* 18*  GLUCOSE 171* 273* 131* 151*  BUN 9 17 14 12   CALCIUM 9.0 8.8* 7.9* 8.4*  CREATININE 1.22 1.49* 1.23 1.16  GFRNONAA 52* 40* 51* 55*  GFRAA >60 47* 59* >60    LIVER FUNCTION TESTS:  Recent Labs  03/30/16 0354 09/20/16 1550 09/21/16 0853 09/22/16 0925  BILITOT 0.7 3.5* 3.0* 1.1  AST 48* 964* 403* 153*  ALT 40 308* 232* 156*  ALKPHOS 131* 314* 189* 176*  PROT 5.8*  5.8* 4.5* 5.1*  ALBUMIN 3.0* 2.8* 2.2* 2.3*    Assessment and Plan: Patient with history of cholangitis, biliary obstruction, status post internal/external biliary drain placement which passes adjacent to occluded common bile duct stent on 09/20/16. Current temperature 99, most recent WBC  normal, hemoglobin stable, T bili normal; blood cultures growing Escherichia coli- antbx per pharm; continue current treatment. As per Dr. Benson Norway once cholangitis resolves he will pursue repeat ERCP with repeat stent placement.   Electronically Signed: D. Rowe Robert 09/23/2016, 11:55 AM   I spent a total of 15 minutes at the the patient's bedside AND on the patient's hospital floor or unit, greater than 50% of which was counseling/coordinating care for biliary drain    Patient ID: [name], male   DOB: [birth year], 82 y.o.   MRN: [id number]

## 2016-09-23 NOTE — Progress Notes (Signed)
PROGRESS NOTE    [name]  [id number] DOB: Jun 24, 1927 DOA: 09/20/2016 PCP: Stephens Shire, MD   Brief Narrative:  82 y.o. 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 As per patient's wife patient was getting weak over the last 2 days and today while walking to the dining table patient's slumped to the. Did not hit his head or lose consciousness. In the ER patient was found to be hypotensive with labs showing markedly elevated LFTs and Lactate. Patient has known history of CBD stones with recurrent obstruction. Has had stents placed previously. CT abdomen and pelvis was done which shows dilated duct with infrarenal aortic aneurysm. Dr. Benson Norway, patient's gastroenterologist was consulted and since patient has difficult ERCP previously IR has been consulted for drain placement and has been placed by the time I examined the patient. On my exam patient appears confused and moves all extremities.   Subjective: 12/31  awake but babbling incoherently     Assessment & Plan:   Principal Problem:   Sepsis ([place]) Active Problems:   Coronary artery disease   DM2 (diabetes mellitus, type 2) (HCC)   Systolic CHF, chronic (HCC)   COPD with emphysema (HCC)   Pacemaker-[employer]   Acute cholangitis   Acute encephalopathy   CAD in native artery   AV block, Mobitz II   Uncontrolled type 2 diabetes mellitus with complication (HCC)  Sepsis/Cholangitis -S/P IR placement Biliary drain  -Continue current antibiotics -Repeat blood cultures on 09/24/2016  Acute on Chronic Encephalopathy -Per wife patient A/O 1 most of the time - CT head was unremarkable -Haldol or Ativan PRN for behavioral disturbances: Use sitter as much as possible and limit sedating medication   Chronic Diastolic  CHF -Strict I&O since admission + 614ml -Daily weight Filed Weights   09/21/16 0000 09/22/16 0400 09/23/16 0406  Weight: 86.4 kg (190 lb 7.6 oz) 85.9 kg (189 lb 6 oz) 83.8 kg (184 lb 11.9 oz)  -Increase Metoprolol IV 7.5 mg QID (home dose 50 mg ).  CAD S/P stenting -Asymptomatic continue to monitor  2d degree AV block Mobitz type II S/P Pacemaker placement.  DM type II uncontrolled with complications -7/4 Hemoglobin A1c= 7.9 -Sensitive SSI  Hypokalemia -Potassium goal> -Potassium IV 30 mEq 2  Hypomagnesemia -Magnesium goal> 2 -Magnesium IV 2 g    Goals of care -PT/OT: Recommend SNF   DVT prophylaxis: SCD Code Status: DO NOT RESUSCITATE Family Communication: None Disposition Plan: SNF   Consultants:  NA  Procedures/Significant Events:  12/28 CT head WO contrast:-Negative acute infarct -Generalized cerebral volume loss and moderate chronic small vessel ischemia.   VENTILATOR SETTINGS:    Cultures 1/28 urine pending 12/28 blood 2 positive Escherichia coli/Enterobacteriaceae 09/24/2016 blood 2 pending   Antimicrobials: Anti-infectives    Start     Stop   09/21/16 1200  levofloxacin (LEVAQUIN) IVPB 500 mg  Status:  Discontinued     09/21/16 1143   09/21/16 1145  cefTRIAXone (ROCEPHIN) 2 g in dextrose 5 % 50 mL IVPB         09/21/16 0800  metroNIDAZOLE (FLAGYL) IVPB 500 mg         09/21/16 0000  levofloxacin (LEVAQUIN) IVPB 750 mg  Status:  Discontinued     09/21/16 0005   09/21/16 0000  metroNIDAZOLE (FLAGYL) IVPB 500 mg     09/21/16 0302   09/20/16 1700  metroNIDAZOLE (FLAGYL) IVPB 500 mg     09/20/16 1815   09/20/16 1630  cefTRIAXone (ROCEPHIN) 1 g in dextrose 5 % 50 mL IVPB     09/20/16 1658   09/20/16 1630  azithromycin (ZITHROMAX) 500 mg in dextrose 5 % 250 mL IVPB     09/20/16 1727       Devices    LINES / TUBES:      Continuous Infusions:    Objective: Vitals:   09/22/16 2326 09/23/16 0008 09/23/16 0406 09/23/16 0413  BP:  (!) 183/89 (!) 166/82  (!) 140/116  Pulse:  (!) 101  (!) 120  Resp:  (!) 28  (!) 27  Temp:  99.2 F (37.3 C)  98.7 F (37.1 C)  TempSrc:  Axillary  Axillary  SpO2:  98%  92%  Weight:   83.8 kg (184 lb 11.9 oz)   Height:        Intake/Output Summary (Last 24 hours) at 09/23/16 [id number] Last data filed at 09/23/16 0423  Gross per 24 hour  Intake              529 ml  Output             2980 ml  Net            -2451 ml   Filed Weights   09/21/16 0000 09/22/16 0400 09/23/16 0406  Weight: 86.4 kg (190 lb 7.6 oz) 85.9 kg (189 lb 6 oz) 83.8 kg (184 lb 11.9 oz)    Examination:  General:  Alert babbling incoherently, does not follow commands, No acute respiratory distress Eyes: negative scleral hemorrhage, negative anisocoria, negative icterus ENT: Negative Runny nose, negative gingival bleeding, Neck:  Negative scars, masses, torticollis, lymphadenopathy, JVD Lungs: Clear to auscultation bilaterally without wheezes or crackles Cardiovascular: Regular rate and rhythm without murmur gallop or rub normal S1 and S2 Abdomen: Obese, negative abdominal pain, nondistended, positive soft, bowel sounds, no rebound, no ascites, no appreciable mass, right biliary drain present (draining significant amount of brownish discharge) Extremities: No significant cyanosis, clubbing, or edema bilateral lower extremities Skin: Negative rashes, lesions, ulcers Psychiatric:  Unable to fully evaluate secondary to dementia Central nervous system:  Unable to fully evaluate.  .     Data Reviewed: Care during the described time interval was provided by me .  I have reviewed this patient's available data, including medical history, events of note, physical examination, and all test results as part of my evaluation. I have personally reviewed and interpreted all radiology studies.  CBC:  Recent Labs Lab 09/20/16 1550 09/21/16 0432 09/22/16 0925  WBC 16.0* 15.5* 8.1  NEUTROABS 14.8* 12.1*  --   HGB 14.4  12.3* 11.9*  HCT 42.1 36.5* 35.5*  MCV 93.3 93.8 92.7  PLT 228 249 [id number]   Basic Metabolic Panel:  Recent Labs Lab 09/20/16 1550 09/21/16 0853 09/22/16 0925  NA 135 140 139  K 4.0 3.9 3.3*  CL 100* 111 112*  CO2 22 20* 18*  GLUCOSE 273* 131* 151*  BUN 17 14 12   CREATININE 1.49* 1.23 1.16  CALCIUM 8.8* 7.9* 8.4*  MG  --   --  1.7   GFR: Estimated Creatinine Clearance (by C-G formula based on SCr of 1.16 mg/dL) Male: 37.3 mL/min Male: 45.6 mL/min Liver Function Tests:  Recent Labs Lab 09/20/16 1550 09/21/16 0853 09/22/16 0925  AST 964* 403* 153*  ALT 308* 232* 156*  ALKPHOS 314* 189* 176*  BILITOT 3.5* 3.0* 1.1  PROT 5.8* 4.5* 5.1*  ALBUMIN 2.8* 2.2* 2.3*   No results for input(s): LIPASE, AMYLASE in the last 168 hours. No results for input(s): AMMONIA in the last 168 hours. Coagulation Profile:  Recent Labs Lab 09/20/16 2029 09/21/16 0109  INR 1.23 1.06   Cardiac Enzymes: No results for input(s): CKTOTAL, CKMB, CKMBINDEX, TROPONINI in the last 168 hours. BNP (last 3 results) No results for input(s): PROBNP in the last 8760 hours. HbA1C: No results for input(s): HGBA1C in the last 72 hours. CBG:  Recent Labs Lab 09/22/16 0831 09/22/16 1158 09/22/16 1656 09/22/16 2355 09/23/16 0411  GLUCAP 155* 108* 115* 127* 135*   Lipid Profile: No results for input(s): CHOL, HDL, LDLCALC, TRIG, CHOLHDL, LDLDIRECT in the last 72 hours. Thyroid Function Tests: No results for input(s): TSH, T4TOTAL, FREET4, T3FREE, THYROIDAB in the last 72 hours. Anemia Panel: No results for input(s): VITAMINB12, FOLATE, FERRITIN, TIBC, IRON, RETICCTPCT in the last 72 hours. Urine analysis:    Component Value Date/Time   COLORURINE AMBER (A) 09/20/2016 1546   APPEARANCEUR HAZY (A) 09/20/2016 1546   LABSPEC 1.014 09/20/2016 1546   PHURINE 7.0 09/20/2016 1546   GLUCOSEU 150 (A) 09/20/2016 1546   HGBUR NEGATIVE 09/20/2016 1546   BILIRUBINUR SMALL (A) 09/20/2016 1546    KETONESUR NEGATIVE 09/20/2016 1546   PROTEINUR 100 (A) 09/20/2016 1546   UROBILINOGEN 2.0 (H) 06/14/2015 0840   NITRITE NEGATIVE 09/20/2016 1546   LEUKOCYTESUR NEGATIVE 09/20/2016 1546   Sepsis Labs: @LABRCNTIP (procalcitonin:4,lacticidven:4)  ) Recent Results (from the past 240 hour(s))  Urine culture     Status: Abnormal   Collection Time: 09/20/16  3:46 PM  Result Value Ref Range Status   Specimen Description URINE, CLEAN CATCH  Final   Special Requests NONE  Final   Culture MULTIPLE SPECIES PRESENT, SUGGEST RECOLLECTION (A)  Final   Report Status 09/21/2016 FINAL  Final  Blood Culture (routine x 2)     Status: Abnormal (Preliminary result)   Collection Time: 09/20/16  3:50 PM  Result Value Ref Range Status   Specimen Description BLOOD LEFT ANTECUBITAL  Final   Special Requests BOTTLES DRAWN AEROBIC AND ANAEROBIC 5CC  Final   Culture  Setup Time   Final    GRAM NEGATIVE RODS AEROBIC BOTTLE ONLY CRITICAL RESULT CALLED TO, READ BACK BY AND VERIFIED WITH: A JOHNSTON,PHARMD AT [id number] 09/21/16 BY L BENFIELD    Culture ESCHERICHIA COLI SUSCEPTIBILITIES TO FOLLOW  (A)  Final   Report Status PENDING  Incomplete  Blood Culture ID Panel (Reflexed)     Status: Abnormal   Collection Time: 09/20/16  3:50 PM  Result Value Ref Range Status   Enterococcus species NOT DETECTED NOT DETECTED Final   Listeria monocytogenes NOT DETECTED NOT DETECTED Final   Staphylococcus species NOT DETECTED NOT DETECTED Final   Staphylococcus aureus NOT DETECTED NOT DETECTED Final   Streptococcus species NOT DETECTED NOT DETECTED Final   Streptococcus agalactiae NOT DETECTED NOT DETECTED Final   Streptococcus pneumoniae NOT DETECTED NOT DETECTED Final   Streptococcus pyogenes NOT DETECTED NOT DETECTED Final   Acinetobacter baumannii NOT DETECTED NOT DETECTED Final   Enterobacteriaceae species DETECTED (A) NOT DETECTED Final    Comment: CRITICAL RESULT CALLED TO, READ BACK BY AND VERIFIED WITH: A  JOHNSTON,PHARMD AT 0841 09/21/16 BY L BENFIELD    Enterobacter cloacae complex NOT DETECTED NOT DETECTED Final   Escherichia coli DETECTED (A) NOT DETECTED  Final    Comment: CRITICAL RESULT CALLED TO, READ BACK BY AND VERIFIED WITH: A JOHNSTON,PHARMD AT 0841 09/21/16 BY L BENFIELD    Klebsiella oxytoca NOT DETECTED NOT DETECTED Final   Klebsiella pneumoniae NOT DETECTED NOT DETECTED Final   Proteus species NOT DETECTED NOT DETECTED Final   Serratia marcescens NOT DETECTED NOT DETECTED Final   Carbapenem resistance NOT DETECTED NOT DETECTED Final   Haemophilus influenzae NOT DETECTED NOT DETECTED Final   Neisseria meningitidis NOT DETECTED NOT DETECTED Final   Pseudomonas aeruginosa NOT DETECTED NOT DETECTED Final   Candida albicans NOT DETECTED NOT DETECTED Final   Candida glabrata NOT DETECTED NOT DETECTED Final   Candida krusei NOT DETECTED NOT DETECTED Final   Candida parapsilosis NOT DETECTED NOT DETECTED Final   Candida tropicalis NOT DETECTED NOT DETECTED Final  Blood Culture (routine x 2)     Status: None (Preliminary result)   Collection Time: 09/20/16  3:55 PM  Result Value Ref Range Status   Specimen Description BLOOD RIGHT ANTECUBITAL  Final   Special Requests BOTTLES DRAWN AEROBIC AND ANAEROBIC 5CC  Final   Culture NO GROWTH 2 DAYS  Final   Report Status PENDING  Incomplete  MRSA PCR Screening     Status: None   Collection Time: 09/21/16 12:27 AM  Result Value Ref Range Status   MRSA by PCR NEGATIVE NEGATIVE Final    Comment:        The GeneXpert MRSA Assay (FDA approved for NASAL specimens only), is one component of a comprehensive MRSA colonization surveillance program. It is not intended to diagnose MRSA infection nor to guide or monitor treatment for MRSA infections.          Radiology Studies: No results found.      Scheduled Meds: . cefTRIAXone (ROCEPHIN)  IV  2 g Intravenous Q24H  . fluticasone furoate-vilanterol  1 puff Inhalation Daily  .  insulin aspart  0-9 Units Subcutaneous Q4H  . metoprolol  5 mg Intravenous Q8H  . metronidazole  500 mg Intravenous Q8H   Continuous Infusions:    LOS: 3 days    Time spent: 40 minutes    Nettie Wyffels, Geraldo Docker, MD [facility] Pager [phone]   If 7PM-7AM, please contact night-coverage [email or web address] Password [facility] 09/23/2016, 6:25 AM

## 2016-09-23 NOTE — Evaluation (Signed)
Occupational Therapy Evaluation Patient Details Name: [name] MRN: [id number] DOB: 04/10/1927 Today's Date: 09/23/2016    History of Present Illness 82 y.o. male with history of CAD status post stenting, second degree AV block status post pacemaker placement, diabetes mellitus type 2, CHF, COPD, dementia was brought to the ER after patient was found to be weak and confused.    Clinical Impression   Unsure of pts PLOF; pt unable to provide information and no family present. Currently pt mod assist +2 for sit to stand x2 from EOB, min guard for grooming in sitting, and max assist for LB ADL/peri care. Pt presenting with inconsistently following one step commands, decreased awareness of safety/insight into deficits, impaired standing balance and generalized weakness impacting his independence and safety with ADL and functional mobility. Recommending SNF for follow up to maximize independence and safety with ADL and functional mobility prior to return home. Pt would benefit from continued skilled OT to address established goals.    Follow Up Recommendations  SNF;Supervision/Assistance - 24 hour    Equipment Recommendations  Other (comment) (TBD)    Recommendations for Other Services       Precautions / Restrictions Precautions Precautions: Fall Restrictions Weight Bearing Restrictions: No      Mobility Bed Mobility Overal bed mobility: Needs Assistance Bed Mobility: Supine to Sit;Sit to Supine     Supine to sit: Mod assist;+2 for physical assistance (for LEs and trunk elevation) Sit to supine: Max assist;+2 for physical assistance   General bed mobility comments: Pt initiating supine to sit with max multimodal cues. No initiation of sit to supine.  Transfers Overall transfer level: Needs assistance Equipment used: Rolling walker (2 wheeled) Transfers: Sit to/from Stand Sit to Stand: Mod assist;+2 physical assistance         General transfer comment: Assist to  boost up from EOB and for standing balance. Verbal cues for initiation of transfer. Sit to stand from EOB x2 performed.    Balance Overall balance assessment: Needs assistance Sitting-balance support: Feet unsupported;No upper extremity supported Sitting balance-Leahy Scale: Good     Standing balance support: Bilateral upper extremity supported Standing balance-Leahy Scale: Poor Standing balance comment: bil UE support with RW and exernal support provided                            ADL Overall ADL's : Needs assistance/impaired     Grooming: Min guard;Set up;Wash/dry face;Sitting   Upper Body Bathing: Moderate assistance;Sitting   Lower Body Bathing: Maximal assistance;+2 for physical assistance;Sit to/from stand   Upper Body Dressing : Minimal assistance;Sitting   Lower Body Dressing: Maximal assistance;+2 for physical assistance;Sit to/from stand       Toileting- Water quality scientist and Hygiene: Total assistance;Sit to/from stand;+2 for physical assistance Toileting - Clothing Manipulation Details (indicate cue type and reason): total assist for peri care in standing     Functional mobility during ADLs: Moderate assistance;+2 for physical assistance;Rolling walker (for sit to stand at EOB only) General ADL Comments: Pt inconsistently following one step commands; unsafe to attempt mobility at this time.     Vision Additional Comments: Difficult to assess due to impaired cognition.   Perception     Praxis      Pertinent Vitals/Pain Pain Assessment: Faces Faces Pain Scale: No hurt     Hand Dominance     Extremity/Trunk Assessment Upper Extremity Assessment Upper Extremity Assessment: Generalized weakness   Lower Extremity Assessment Lower  Extremity Assessment: Defer to PT evaluation   Cervical / Trunk Assessment Cervical / Trunk Assessment: Kyphotic   Communication Communication Communication: HOH;Other (comment) (mumbled and cannot understand  at times)   Cognition Arousal/Alertness: Awake/alert Behavior During Therapy: Restless (Fidgety with lines and restraints) Overall Cognitive Status: No family/caregiver present to determine baseline cognitive functioning                 General Comments: Known hx of dementia but anticipate pt currently not at his baseline cognitively. Pt intermittently following one step commands and requires consistent cues for initiation and sequencing of activities.   General Comments       Exercises       Shoulder Instructions      Home Living Family/patient expects to be discharged to:: Private residence Living Arrangements: Spouse/significant other                               Additional Comments: Pt unable to provide home set up information; no family present.      Prior Functioning/Environment          Comments: Unsure. Pt unable to provide and no family present.        OT Problem List: Decreased strength;Decreased activity tolerance;Impaired balance (sitting and/or standing);Decreased cognition;Decreased safety awareness;Decreased knowledge of use of DME or AE   OT Treatment/Interventions: Self-care/ADL training;DME and/or AE instruction;Therapeutic activities;Cognitive remediation/compensation;Patient/family education;Balance training    OT Goals(Current goals can be found in the care plan section) Acute Rehab OT Goals Patient Stated Goal: none stated OT Goal Formulation: With patient Time For Goal Achievement: 10/07/16 Potential to Achieve Goals: Fair ADL Goals Pt Will Perform Grooming: with set-up;sitting Pt Will Perform Upper Body Bathing: with supervision;sitting Pt Will Perform Lower Body Bathing: with min guard assist;sit to/from stand Pt Will Transfer to Toilet: with min guard assist;ambulating;regular height toilet Pt Will Perform Toileting - Clothing Manipulation and hygiene: with min guard assist;sit to/from stand  OT Frequency: Min 2X/week    Barriers to D/C:            Co-evaluation PT/OT/SLP Co-Evaluation/Treatment: Yes Reason for Co-Treatment: Complexity of the patient's impairments (multi-system involvement);Necessary to address cognition/behavior during functional activity;For patient/therapist safety;To address functional/ADL transfers   OT goals addressed during session: ADL's and self-care      End of Session Equipment Utilized During Treatment: Rolling walker Nurse Communication: Mobility status  Activity Tolerance: Patient tolerated treatment well Patient left: in bed;with call bell/phone within reach;with bed alarm set;with restraints reapplied   Time: 1456-1523 OT Time Calculation (min): 27 min Charges:  OT General Charges $OT Visit: 1 Procedure OT Evaluation $OT Eval Moderate Complexity: 1 Procedure G-Codes:     Binnie Kand M.S., OTR/L Pager: [phone]  09/23/2016, 3:48 PM

## 2016-09-23 NOTE — Evaluation (Signed)
Physical Therapy Evaluation Patient Details Name: [name] MRN: [id number] DOB: [birth year] Today's Date: 09/23/2016   History of Present Illness  82 y.o. male with history of CAD status post stenting, second degree AV block status post pacemaker placement, diabetes mellitus type 2, CHF, COPD, dementia was brought to the ER after patient was found to be weak and confused.   Clinical Impression  Patient presents with problems listed below.  Will benefit from acute PT to maximize functional mobility prior to discharge.  Unsure of patient's PLOF - patient unable to provide information.  Today patient required +2 mod-max assist with all mobility and to stand.  Patient with significant decrease in cognition, inconsistently following one step commands, decreased safety awareness.  Recommend SNF at d/c for continued therapy for mobility, gait, safety.    Follow Up Recommendations SNF;Supervision/Assistance - 24 hour    Equipment Recommendations  Wheelchair (measurements PT);Wheelchair cushion (measurements PT)    Recommendations for Other Services       Precautions / Restrictions Precautions Precautions: Fall Precaution Comments: biliary drain Rt flank Restrictions Weight Bearing Restrictions: No      Mobility  Bed Mobility Overal bed mobility: Needs Assistance Bed Mobility: Supine to Sit;Sit to Supine     Supine to sit: Mod assist;+2 for physical assistance Sit to supine: Max assist;+2 for physical assistance   General bed mobility comments: Pt initiating supine to sit with max multimodal cues. No initiation of sit to supine.  Transfers Overall transfer level: Needs assistance Equipment used: Rolling walker (2 wheeled) Transfers: Sit to/from Stand Sit to Stand: Mod assist;+2 physical assistance         General transfer comment: Assist to boost up from EOB and for standing balance. Verbal cues for initiation of transfer. Sit to stand from EOB x2 performed.  Patient  able to stand x 2-3 minutes each time.  Ambulation/Gait             General Gait Details: NT  Stairs            Wheelchair Mobility    Modified Rankin (Stroke Patients Only)       Balance Overall balance assessment: Needs assistance Sitting-balance support: Feet unsupported;No upper extremity supported Sitting balance-Leahy Scale: Good     Standing balance support: Bilateral upper extremity supported Standing balance-Leahy Scale: Poor Standing balance comment: bil UE support with RW and exernal support provided                             Pertinent Vitals/Pain Pain Assessment: Faces Pain Score: 1  Faces Pain Scale: No hurt    Home Living Family/patient expects to be discharged to:: Private residence Living Arrangements: Spouse/significant other Available Help at Discharge: Family;Available 24 hours/day Type of Home: House Home Access: Stairs to enter Entrance Stairs-Rails: Psychiatric nurse of Steps: 2 Home Layout: Multi-level Home Equipment: Walker - 2 wheels;Cane - single point;Grab bars - toilet Additional Comments: Patient unable to provide home set up information.  Above information from medical record.    Prior Function           Comments: Unsure. Pt unable to provide and no family present.     Hand Dominance   Dominant Hand: Right    Extremity/Trunk Assessment   Upper Extremity Assessment Upper Extremity Assessment: Defer to OT evaluation    Lower Extremity Assessment Lower Extremity Assessment: Generalized weakness    Cervical / Trunk Assessment Cervical / Trunk Assessment:  Kyphotic  Communication   Communication: HOH;Expressive difficulties ([place]; difficult to understand)  Cognition Arousal/Alertness: Awake/alert Behavior During Therapy: Restless (Fidgety with lines and restraints) Overall Cognitive Status: No family/caregiver present to determine baseline cognitive functioning                  General Comments: Known hx of dementia but anticipate pt currently not at his baseline cognitively. Pt intermittently following one step commands and requires consistent cues for initiation and sequencing of activities.    General Comments      Exercises     Assessment/Plan    PT Assessment Patient needs continued PT services  PT Problem List Decreased strength;Decreased balance;Decreased mobility;Decreased cognition;Decreased knowledge of use of DME;Decreased safety awareness          PT Treatment Interventions DME instruction;Gait training;Functional mobility training;Therapeutic activities;Patient/family education    PT Goals (Current goals can be found in the Care Plan section)  Acute Rehab PT Goals Patient Stated Goal: Unable to state PT Goal Formulation: Patient unable to participate in goal setting Time For Goal Achievement: 10/07/16 Potential to Achieve Goals: Fair    Frequency Min 2X/week   Barriers to discharge   Do not feel wife can provide assist needed at home at this time.    Co-evaluation PT/OT/SLP Co-Evaluation/Treatment: Yes Reason for Co-Treatment: Complexity of the patient's impairments (multi-system involvement);Necessary to address cognition/behavior during functional activity;To address functional/ADL transfers PT goals addressed during session: Mobility/safety with mobility OT goals addressed during session: ADL's and self-care       End of Session Equipment Utilized During Treatment: Gait belt Activity Tolerance: Patient tolerated treatment well Patient left: in bed;with call bell/phone within reach;with bed alarm set;with restraints reapplied Nurse Communication: Mobility status         Time: [id number] PT Time Calculation (min) (ACUTE ONLY): 27 min   Charges:   PT Evaluation $PT Eval Moderate Complexity: 1 Procedure     PT G Codes:        Despina Pole 10/06/16, 5:17 PM Carita Pian. Sanjuana Kava, [place] Pager [phone]

## 2016-09-23 NOTE — Evaluation (Signed)
Clinical/Bedside Swallow Evaluation Patient Details  Name: [name] MRN: [id number] Date of Birth: 10-06-1926  Today's Date: 09/23/2016 Time: SLP Start Time (ACUTE ONLY): 0820 SLP Stop Time (ACUTE ONLY): 0846 SLP Time Calculation (min) (ACUTE ONLY): 26 min  Past Medical History:  Past Medical History:  Diagnosis Date  . Bacteremia due to vancomycin resistant Enterococcus   . Cancer ([place])    precancer skin lesions  . Chronic renal insufficiency   . COPD (chronic obstructive pulmonary disease) with chronic bronchitis ([place])   . Coronary artery disease   . Dementia in Alzheimer's disease with delirium   . Diabetes mellitus   . Duodenal ulcer   . Gallstone pancreatitis    recurrent  . GERD (gastroesophageal reflux disease)   . GI bleed   . Hearing impairment   . Hyperlipidemia   . Hypertension   . Myocardial infarction, anterior wall, subsequent care   . Obesities, morbid ([place])   . Second degree AV block, Mobitz type II    a. s/p STJ dual chamber PPM  . Sepsis ([place]) 03/2016  . TIA (transient ischemic attack)    Past Surgical History:  Past Surgical History:  Procedure Laterality Date  . BILIARY STENT PLACEMENT N/A 07/09/2014   Procedure: BILIARY STENT PLACEMENT;  Surgeon: Beryle Beams, MD;  Location: [facility] ENDOSCOPY;  Service: Endoscopy;  Laterality: N/A;  . CHOLECYSTECTOMY    . ERCP N/A 05/30/2014   Procedure: ENDOSCOPIC RETROGRADE CHOLANGIOPANCREATOGRAPHY (ERCP);  Surgeon: Ladene Artist, MD;  Location: [facility] ENDOSCOPY;  Service: Endoscopy;  Laterality: N/A;  . ERCP N/A 07/09/2014   Procedure: ENDOSCOPIC RETROGRADE CHOLANGIOPANCREATOGRAPHY (ERCP);  Surgeon: Beryle Beams, MD;  Location: [facility];  Service: Endoscopy;  Laterality: N/A;  . IR GENERIC HISTORICAL  09/20/2016   IR INT EXT BILIARY DRAIN WITH CHOLANGIOGRAM 09/20/2016 Jacqulynn Cadet, MD [facility]-INTERV RAD  . PACEMAKER PLACEMENT  01/01/11   STJ dual chamber PPM implanted by Dr Rayann Heman for Mobitz II   . pancreatic  stent     HPI:  82 y.o.WMPMHxDementia in Alzheimer's disease with delirium CAD S/P stenting, 2d degreeAV block Mobitz type II S/P pacemaker placement, DM type 2, Chronic Diastolic CHF, HTN, HLD, COPD. Brought to the ER afterpatient was found to be weak and confused. Patient has known history of CBD stones with recurrent obstruction. Has had stents placed previously. CT abdomen and pelvis was done which shows dilated duct with infrarenal aortic aneurysm.    Assessment / Plan / Recommendation Clinical Impression  Pt demonstrates concern for dysphagia with subjective findings of coughing with large sips of thin liquids and questionable wet vocal quality with puree/pudding and nectar thick liquids. Pt observed to be severely dysarthric with suspected velopharyngeal incompetency. Pt could not achieve appropriate suction on straw likely due to nasal emission of air. Transition of bolus from oral cavity to oropharynx also suspected to be impaired. Given increased risk of aspiration in setting of loss of functional reserve, objective swallow eval warranted prior to diet inititiation.     Aspiration Risk  Moderate aspiration risk    Diet Recommendation NPO except meds   Medication Administration: Crushed with puree    Other  Recommendations Oral Care Recommendations: Oral care QID   Follow up Recommendations Skilled Nursing facility;24 hour supervision/assistance      Frequency and Duration min 2x/week  2 weeks       Prognosis Prognosis for Safe Diet Advancement: Good      Swallow Study   General HPI: 82 y.o.WMPMHxDementia in  Alzheimer's disease with delirium CAD S/P stenting, 2d degreeAV block Mobitz type II S/P pacemaker placement, DM type 2, Chronic Diastolic CHF, HTN, HLD, COPD. Brought to the ER afterpatient was found to be weak and confused. Patient has known history of CBD stones with recurrent obstruction. Has had stents placed previously. CT abdomen and pelvis was done which  shows dilated duct with infrarenal aortic aneurysm.  Type of Study: Bedside Swallow Evaluation Previous Swallow Assessment: none Diet Prior to this Study: NPO Temperature Spikes Noted: No Respiratory Status: Nasal cannula History of Recent Intubation: No Behavior/Cognition: Alert;Cooperative;Pleasant mood Oral Cavity Assessment: Dry Oral Care Completed by SLP: Yes Oral Cavity - Dentition: Dentures, top;Edentulous Vision: Functional for self-feeding Self-Feeding Abilities: Able to feed self;Needs assist Patient Positioning: Upright in bed Baseline Vocal Quality: Normal Volitional Cough: Strong Volitional Swallow: Unable to elicit    Oral/Motor/Sensory Function Overall Oral Motor/Sensory Function: Other (comment) (Suspect velopharyngeal incompetency)   Ice Chips Ice chips: Within functional limits   Thin Liquid Thin Liquid: Impaired Presentation: Cup;Straw;Self Fed Oral Phase Functional Implications: Other (comment) (poor negative pressure with straw sips ?VPI) Pharyngeal  Phase Impairments: Cough - Immediate;Cough - Delayed;Wet Vocal Quality    Nectar Thick Nectar Thick Liquid: Impaired Pharyngeal Phase Impairments: Wet Vocal Quality   Honey Thick Honey Thick Liquid: Not tested   Puree Puree: Impaired Presentation: Spoon Pharyngeal Phase Impairments: Cough - Immediate;Wet Vocal Quality   Solid   GO   Solid: Not tested       [name], MA CCC-SLP [phone]  [name] 09/23/2016,9:03 AM

## 2016-09-24 LAB — GLUCOSE, CAPILLARY
GLUCOSE-CAPILLARY: 118 mg/dL — AB (ref 65–99)
GLUCOSE-CAPILLARY: 133 mg/dL — AB (ref 65–99)
Glucose-Capillary: 143 mg/dL — ABNORMAL HIGH (ref 65–99)
Glucose-Capillary: 165 mg/dL — ABNORMAL HIGH (ref 65–99)
Glucose-Capillary: 168 mg/dL — ABNORMAL HIGH (ref 65–99)
Glucose-Capillary: 169 mg/dL — ABNORMAL HIGH (ref 65–99)
Glucose-Capillary: 180 mg/dL — ABNORMAL HIGH (ref 65–99)

## 2016-09-24 LAB — CBC WITH DIFFERENTIAL/PLATELET
BASOS ABS: 0 10*3/uL (ref 0.0–0.1)
Basophils Relative: 0 %
EOS PCT: 0 %
Eosinophils Absolute: 0 10*3/uL (ref 0.0–0.7)
HEMATOCRIT: 41.9 % (ref 39.0–52.0)
HEMOGLOBIN: 13.9 g/dL (ref 13.0–17.0)
LYMPHS PCT: 21 %
Lymphs Abs: 2.1 10*3/uL (ref 0.7–4.0)
MCH: 31.6 pg (ref 26.0–34.0)
MCHC: 33.2 g/dL (ref 30.0–36.0)
MCV: 95.2 fL (ref 78.0–100.0)
MONOS PCT: 4 %
Monocytes Absolute: 0.4 10*3/uL (ref 0.1–1.0)
Neutro Abs: 7.5 10*3/uL (ref 1.7–7.7)
Neutrophils Relative %: 75 %
Platelets: 319 10*3/uL (ref 150–400)
RBC: 4.4 MIL/uL (ref 4.22–5.81)
RDW: 14.2 % (ref 11.5–15.5)
WBC: 10 10*3/uL (ref 4.0–10.5)

## 2016-09-24 LAB — BASIC METABOLIC PANEL
ANION GAP: 13 (ref 5–15)
BUN: 14 mg/dL (ref 6–20)
CO2: 20 mmol/L — ABNORMAL LOW (ref 22–32)
Calcium: 9 mg/dL (ref 8.9–10.3)
Chloride: 111 mmol/L (ref 101–111)
Creatinine, Ser: 1.2 mg/dL (ref 0.61–1.24)
GFR calc Af Amer: >60 mL/min (ref >60)
GFR, EST NON AFRICAN AMERICAN: 53 mL/min — AB (ref >60)
GLUCOSE: 145 mg/dL — AB (ref 65–99)
POTASSIUM: 4.6 mmol/L (ref 3.5–5.1)
Sodium: 144 mmol/L (ref 135–145)

## 2016-09-24 LAB — MAGNESIUM: MAGNESIUM: 2.2 mg/dL (ref 1.7–2.4)

## 2016-09-24 MED ORDER — METOPROLOL TARTRATE 25 MG/10 ML ORAL SUSPENSION
25.0000 mg | Freq: Two times a day (BID) | ORAL | Status: DC
  Administered 2016-09-24 – 2016-09-25 (×3): 25 mg via ORAL
  Filled 2016-09-24 (×5): qty 10

## 2016-09-24 NOTE — Progress Notes (Addendum)
PROGRESS NOTE    [name]  [id number] DOB: 1927-06-21 DOA: 09/20/2016 PCP: Stephens Shire, MD   Brief Narrative:  83 y.o. 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 As per patient's wife patient was getting weak over the last 2 days and  while walking to the dining table patient's slumped to the. Did not hit his head or lose consciousness. In the ER patient was found to be hypotensive with labs showing markedly elevated LFTs and Lactate. Patient has known history of CBD stones with recurrent obstruction. Has had stents placed previously. CT abdomen and pelvis was done which shows dilated duct with infrarenal aortic aneurysm. Dr. Benson Norway, patient's gastroenterologist was consulted and since patient has difficult ERCP previously IR has been consulted for drain placement and has been placed by the time I examined the patient. On my exam patient appears confused and moves all extremities.   Subjective: Awake and interactive with his daughter and his wife    Assessment & Plan:   Principal Problem:   Sepsis ([place]) Active Problems:   Coronary artery disease   DM2 (diabetes mellitus, type 2) (HCC)   Systolic CHF, chronic ([place])   COPD with emphysema ([place])   Pacemaker-[employer]   Acute cholangitis   Acute encephalopathy   CAD in native artery   AV block, Mobitz II   Uncontrolled type 2 diabetes mellitus with complication ([place])   Dementia associated with other underlying disease with behavioral disturbance   Chronic diastolic CHF (congestive heart failure) (HCC)   Cholangitis  Sepsis with gram-negative bacteremia/Cholangitis status post internal/external biliary drain placement which passes adjacent to occluded common bile duct stent on 09/20/16 -Continue current antibiotics,  may be able to switch Rocephin to [facility]  As per Dr. Benson Norway once cholangitis resolves he will pursue repeat ERCP with repeat stent placement. Dr. Benson Norway to reevaluate patient in 09/25/16   Acute on Chronic Encephalopathy -Per wife patient A/O 1 most of the time - CT head was unremarkable -Haldol or Ativan PRN for behavioral disturbances: Use sitter as much as possible and limit sedating medication   Chronic Diastolic CHF -Strict I&O since admission  -Daily weight Filed Weights   09/22/16 0400 09/23/16 0406 09/24/16 0331  Weight: 85.9 kg (189 lb 6 oz) 83.8 kg (184 lb 11.9 oz) 80.5 kg (177 lb 7.5 oz)  Change metoprolol to PO    CAD S/P stenting -Asymptomatic continue to monitor  2d degree AV block Mobitz type II S/P Pacemaker placement. Continue metoprolol  DM type II uncontrolled with complications -7/4 Hemoglobin A1c= 7.9 -Sensitive SSI  Hypokalemia -Potassium goal> -Potassium IV 30 mEq 2  Hypomagnesemia -Magnesium goal> 2 -Magnesium IV 2 g  Dysphagia-Dysphagia 1 (Puree) solids;Honey thick liquids  Goals of care -PT/OT: Recommend SNF   DVT prophylaxis: SCD Code Status: DO NOT RESUSCITATE Family Communication: None Disposition Plan: SNF?Patient's daughter and wife refused SNF Anticipate discharge 1/3 with home health   Consultants:  NA  Procedures/Significant Events:  12/28 CT head WO contrast:-Negative acute infarct -Generalized cerebral volume loss and moderate chronic small vessel ischemia.   VENTILATOR SETTINGS:    Cultures 1/28 urine pending 12/28 blood 2 positive Escherichia coli/Enterobacteriaceae 09/24/2016 blood 2 pending   Antimicrobials: Anti-infectives    Start     Stop   09/21/16  1200  levofloxacin (LEVAQUIN) IVPB 500 mg  Status:  Discontinued     09/21/16 1143   09/21/16 1145  cefTRIAXone (ROCEPHIN) 2 g in dextrose 5 % 50 mL IVPB         09/21/16 0800  metroNIDAZOLE (FLAGYL) IVPB 500 mg         09/21/16 0000  levofloxacin  (LEVAQUIN) IVPB 750 mg  Status:  Discontinued     09/21/16 0005   09/21/16 0000  metroNIDAZOLE (FLAGYL) IVPB 500 mg     09/21/16 0302   09/20/16 1700  metroNIDAZOLE (FLAGYL) IVPB 500 mg     09/20/16 1815   09/20/16 1630  cefTRIAXone (ROCEPHIN) 1 g in dextrose 5 % 50 mL IVPB     09/20/16 1658   09/20/16 1630  azithromycin (ZITHROMAX) 500 mg in dextrose 5 % 250 mL IVPB     09/20/16 1727       Devices    LINES / TUBES:      Continuous Infusions:    Objective: Vitals:   09/23/16 2300 09/24/16 0300 09/24/16 0331 09/24/16 0727  BP: (!) 153/90 138/83  (!) 161/98  Pulse: 94 92  94  Resp: (!) 27 (!) 24  (!) 27  Temp: 97.8 F (36.6 C) 98.6 F (37 C)  99 F (37.2 C)  TempSrc: Axillary Oral  Axillary  SpO2: 100% 99%  98%  Weight:   80.5 kg (177 lb 7.5 oz)   Height:        Intake/Output Summary (Last 24 hours) at 09/24/16 0945 Last data filed at 09/24/16 0900  Gross per 24 hour  Intake              210 ml  Output             3225 ml  Net            -3015 ml   Filed Weights   09/22/16 0400 09/23/16 0406 09/24/16 0331  Weight: 85.9 kg (189 lb 6 oz) 83.8 kg (184 lb 11.9 oz) 80.5 kg (177 lb 7.5 oz)    Examination:  General:  Alert babbling incoherently, does not follow commands, No acute respiratory distress Eyes: negative scleral hemorrhage, negative anisocoria, negative icterus ENT: Negative Runny nose, negative gingival bleeding, Neck:  Negative scars, masses, torticollis, lymphadenopathy, JVD Lungs: Clear to auscultation bilaterally without wheezes or crackles Cardiovascular: Regular rate and rhythm without murmur gallop or rub normal S1 and S2 Abdomen: Obese, negative abdominal pain, nondistended, positive soft, bowel sounds, no rebound, no ascites, no appreciable mass, right biliary drain present (draining significant amount of brownish discharge) Extremities: No significant cyanosis, clubbing, or edema bilateral lower extremities Skin: Negative rashes, lesions,  ulcers Psychiatric:  Unable to fully evaluate secondary to dementia Central nervous system:  Unable to fully evaluate.  .     Data Reviewed: Care during the described time interval was provided by me .  I have reviewed this patient's available data, including medical history, events of note, physical examination, and all test results as part of my evaluation. I have personally reviewed and interpreted all radiology studies.  CBC:  Recent Labs Lab 09/20/16 1550 09/21/16 0432 09/22/16 0925 09/24/16 0548  WBC 16.0* 15.5* 8.1 10.0  NEUTROABS 14.8* 12.1*  --  7.5  HGB 14.4 12.3* 11.9* 13.9  HCT 42.1 36.5* 35.5* 41.9  MCV 93.3 93.8 92.7 95.2  PLT 228 249 225 [id number]   Basic Metabolic Panel:  Recent Labs Lab 09/20/16 1550 09/21/16 0853 09/22/16 [id number]  09/24/16 0548  NA 135 140 139 144  K 4.0 3.9 3.3* 4.6  CL 100* 111 112* 111  CO2 22 20* 18* 20*  GLUCOSE 273* 131* 151* 145*  BUN 17 14 12 14   CREATININE 1.49* 1.23 1.16 1.20  CALCIUM 8.8* 7.9* 8.4* 9.0  MG  --   --  1.7 2.2   GFR: Estimated Creatinine Clearance (by C-G formula based on SCr of 1.2 mg/dL) Male: 35.4 mL/min Male: 43.2 mL/min Liver Function Tests:  Recent Labs Lab 09/20/16 1550 09/21/16 0853 09/22/16 0925  AST 964* 403* 153*  ALT 308* 232* 156*  ALKPHOS 314* 189* 176*  BILITOT 3.5* 3.0* 1.1  PROT 5.8* 4.5* 5.1*  ALBUMIN 2.8* 2.2* 2.3*   No results for input(s): LIPASE, AMYLASE in the last 168 hours. No results for input(s): AMMONIA in the last 168 hours. Coagulation Profile:  Recent Labs Lab 09/20/16 2029 09/21/16 0109  INR 1.23 1.06   Cardiac Enzymes: No results for input(s): CKTOTAL, CKMB, CKMBINDEX, TROPONINI in the last 168 hours. BNP (last 3 results) No results for input(s): PROBNP in the last 8760 hours. HbA1C: No results for input(s): HGBA1C in the last 72 hours. CBG:  Recent Labs Lab 09/23/16 1557 09/23/16 1920 09/23/16 2341 09/24/16 0306 09/24/16 0802  GLUCAP 166* 127*  133* 133* 143*   Lipid Profile: No results for input(s): CHOL, HDL, LDLCALC, TRIG, CHOLHDL, LDLDIRECT in the last 72 hours. Thyroid Function Tests: No results for input(s): TSH, T4TOTAL, FREET4, T3FREE, THYROIDAB in the last 72 hours. Anemia Panel: No results for input(s): VITAMINB12, FOLATE, FERRITIN, TIBC, IRON, RETICCTPCT in the last 72 hours. Urine analysis:    Component Value Date/Time   COLORURINE AMBER (A) 09/20/2016 1546   APPEARANCEUR HAZY (A) 09/20/2016 1546   LABSPEC 1.014 09/20/2016 1546   PHURINE 7.0 09/20/2016 1546   GLUCOSEU 150 (A) 09/20/2016 1546   HGBUR NEGATIVE 09/20/2016 1546   BILIRUBINUR SMALL (A) 09/20/2016 1546   KETONESUR NEGATIVE 09/20/2016 1546   PROTEINUR 100 (A) 09/20/2016 1546   UROBILINOGEN 2.0 (H) 06/14/2015 0840   NITRITE NEGATIVE 09/20/2016 1546   LEUKOCYTESUR NEGATIVE 09/20/2016 1546   Sepsis Labs: @LABRCNTIP (procalcitonin:4,lacticidven:4)  ) Recent Results (from the past 240 hour(s))  Urine culture     Status: Abnormal   Collection Time: 09/20/16  3:46 PM  Result Value Ref Range Status   Specimen Description URINE, CLEAN CATCH  Final   Special Requests NONE  Final   Culture MULTIPLE SPECIES PRESENT, SUGGEST RECOLLECTION (A)  Final   Report Status 09/21/2016 FINAL  Final  Blood Culture (routine x 2)     Status: Abnormal   Collection Time: 09/20/16  3:50 PM  Result Value Ref Range Status   Specimen Description BLOOD LEFT ANTECUBITAL  Final   Special Requests BOTTLES DRAWN AEROBIC AND ANAEROBIC 5CC  Final   Culture  Setup Time   Final    GRAM NEGATIVE RODS AEROBIC BOTTLE ONLY CRITICAL RESULT CALLED TO, READ BACK BY AND VERIFIED WITH: A JOHNSTON,PHARMD AT [id number] 09/21/16 BY L BENFIELD    Culture ESCHERICHIA COLI (A)  Final   Report Status 09/23/2016 FINAL  Final   Organism ID, Bacteria ESCHERICHIA COLI  Final      Susceptibility   Escherichia coli - MIC*    AMPICILLIN >=32 RESISTANT Resistant     CEFAZOLIN >=64 RESISTANT Resistant      CEFEPIME <=1 SENSITIVE Sensitive     CEFTAZIDIME <=1 SENSITIVE Sensitive     CEFTRIAXONE <=1 SENSITIVE Sensitive  CIPROFLOXACIN >=4 RESISTANT Resistant     GENTAMICIN >=16 RESISTANT Resistant     IMIPENEM <=0.25 SENSITIVE Sensitive     TRIMETH/SULFA >=320 RESISTANT Resistant     AMPICILLIN/SULBACTAM >=32 RESISTANT Resistant     PIP/TAZO >=128 RESISTANT Resistant     Extended ESBL NEGATIVE Sensitive     * ESCHERICHIA COLI  Blood Culture ID Panel (Reflexed)     Status: Abnormal   Collection Time: 09/20/16  3:50 PM  Result Value Ref Range Status   Enterococcus species NOT DETECTED NOT DETECTED Final   Listeria monocytogenes NOT DETECTED NOT DETECTED Final   Staphylococcus species NOT DETECTED NOT DETECTED Final   Staphylococcus aureus NOT DETECTED NOT DETECTED Final   Streptococcus species NOT DETECTED NOT DETECTED Final   Streptococcus agalactiae NOT DETECTED NOT DETECTED Final   Streptococcus pneumoniae NOT DETECTED NOT DETECTED Final   Streptococcus pyogenes NOT DETECTED NOT DETECTED Final   Acinetobacter baumannii NOT DETECTED NOT DETECTED Final   Enterobacteriaceae species DETECTED (A) NOT DETECTED Final    Comment: CRITICAL RESULT CALLED TO, READ BACK BY AND VERIFIED WITH: A JOHNSTON,PHARMD AT 0841 09/21/16 BY L BENFIELD    Enterobacter cloacae complex NOT DETECTED NOT DETECTED Final   Escherichia coli DETECTED (A) NOT DETECTED Final    Comment: CRITICAL RESULT CALLED TO, READ BACK BY AND VERIFIED WITH: A JOHNSTON,PHARMD AT 0841 09/21/16 BY L BENFIELD    Klebsiella oxytoca NOT DETECTED NOT DETECTED Final   Klebsiella pneumoniae NOT DETECTED NOT DETECTED Final   Proteus species NOT DETECTED NOT DETECTED Final   Serratia marcescens NOT DETECTED NOT DETECTED Final   Carbapenem resistance NOT DETECTED NOT DETECTED Final   Haemophilus influenzae NOT DETECTED NOT DETECTED Final   Neisseria meningitidis NOT DETECTED NOT DETECTED Final   Pseudomonas aeruginosa NOT DETECTED NOT  DETECTED Final   Candida albicans NOT DETECTED NOT DETECTED Final   Candida glabrata NOT DETECTED NOT DETECTED Final   Candida krusei NOT DETECTED NOT DETECTED Final   Candida parapsilosis NOT DETECTED NOT DETECTED Final   Candida tropicalis NOT DETECTED NOT DETECTED Final  Blood Culture (routine x 2)     Status: None (Preliminary result)   Collection Time: 09/20/16  3:55 PM  Result Value Ref Range Status   Specimen Description BLOOD RIGHT ANTECUBITAL  Final   Special Requests BOTTLES DRAWN AEROBIC AND ANAEROBIC 5CC  Final   Culture  Setup Time   Final    GRAM NEGATIVE RODS AEROBIC BOTTLE ONLY CRITICAL VALUE NOTED.  VALUE IS CONSISTENT WITH PREVIOUSLY REPORTED AND CALLED VALUE.    Culture   Final    GRAM NEGATIVE RODS CULTURE REINCUBATED FOR BETTER GROWTH    Report Status PENDING  Incomplete  MRSA PCR Screening     Status: None   Collection Time: 09/21/16 12:27 AM  Result Value Ref Range Status   MRSA by PCR NEGATIVE NEGATIVE Final    Comment:        The GeneXpert MRSA Assay (FDA approved for NASAL specimens only), is one component of a comprehensive MRSA colonization surveillance program. It is not intended to diagnose MRSA infection nor to guide or monitor treatment for MRSA infections.          Radiology Studies: No results found.      Scheduled Meds: . cefTRIAXone (ROCEPHIN)  IV  2 g Intravenous Q24H  . fluticasone furoate-vilanterol  1 puff Inhalation Daily  . insulin aspart  0-9 Units Subcutaneous Q4H  . metoprolol  5 mg Intravenous Q8H  .  metronidazole  500 mg Intravenous Q8H   Continuous Infusions:    LOS: 4 days    Time spent: 40 minutes    Reymundo Winship, MD [facility]     If 7PM-7AM, please contact night-coverage [email or web address] Password [email or web address] 09/24/2016, 9:45 AM

## 2016-09-24 NOTE — Progress Notes (Signed)
Pharmacy Antibiotic Note  [name] is a 83 y.o. male admitted on 09/20/2016 with sepsis w/ cholangitis.  Pharmacy has been consulted for Ceftriaxone and Flagyl dosing. Today is D#4 of ABX.  -WBC WNL, Afebrile -PCT elevated ~30 on admission 12/29 -LA down 3.4>1.2.  Plan: -Continue rocephin at 2g q24h -Continue 500mg  IV Q8H -Monitor LOT, patient status, cultures, PO tolerance -Renal adjustment not needed for these medications, pharmacy will sign off  Height: 5\' 6"  (167.6 cm) Weight: 177 lb 7.5 oz (80.5 kg) IBW/kg (Calculated) : 63.8  Temp (24hrs), Avg:98.8 F (37.1 C), Min:97.8 F (36.6 C), Max:99.8 F (37.7 C)   Recent Labs Lab 09/20/16 1550 09/20/16 1612 09/20/16 2034 09/21/16 0109 09/21/16 0432 09/21/16 0853 09/22/16 0925 09/24/16 0548  WBC 16.0*  --   --   --  15.5*  --  8.1 10.0  CREATININE 1.49*  --   --   --   --  1.23 1.16 1.20  LATICACIDVEN  --  5.99* 2.49* 2.1* 3.4* 1.2  --   --     Estimated Creatinine Clearance (by C-G formula based on SCr of 1.2 mg/dL) Male: 35.4 mL/min Male: 43.2 mL/min    Allergies  Allergen Reactions  . Doxycycline Other (See Comments)    Unknown- patient doesn't remember  . Penicillins Rash    Has patient had a PCN reaction causing immediate rash, facial/tongue/throat swelling, SOB or lightheadedness with hypotension: Yes Has patient had a PCN reaction causing severe rash involving mucus membranes or skin necrosis: No Has patient had a PCN reaction that required hospitalization: No Has patient had a PCN reaction occurring within the last 10 years: Yes If all of the above answers are "NO", then may proceed with Cephalosporin use.    Antimicrobials this admission:  12/29 CTX >>  12/29 Flagyl >>  12/29 Azithro >> x1 in ED 12/29 levaquin >> 12/29   Dose adjustments this admission:  na  Microbiology results:  12/28 BCx: GNR - BCID with E Coli 12/28 UCx: multiple species present 12/28 MRSA PCR: negative 1/1 BCx:  sent  Thank you for allowing pharmacy to be a part of this patient's care.  Myer Peer Grayland Ormond), PharmD  PGY1 Pharmacy Resident Pager: [phone] 09/24/2016 10:53 AM

## 2016-09-24 NOTE — Clinical Social Work Note (Signed)
Clinical Social Work Assessment  Patient Details  Name: [name] MRN: [id number] Date of Birth: [birth year]  Date of referral:  09/24/16               Reason for consult:  Facility Placement                Permission sought to share information with:  Facility Sport and exercise psychologist, Family Supports Permission granted to share information::  No  Name::     Engineer, manufacturing::  SNFs  Relationship::  Spouse  Contact Information:     Housing/Transportation Living arrangements for the past 2 months:  Single Family Home Source of Information:  Spouse Patient Interpreter Needed:  None Criminal Activity/Legal Involvement Pertinent to Current Situation/Hospitalization:  No - Comment as needed Significant Relationships:  Spouse, Adult Children Lives with:  Spouse Do you feel safe going back to the place where you live?  Yes Need for family participation in patient care:  Yes (Comment)  Care giving concerns:  CSW received referral regarding discharge planning. Patient is disoriented. CSW spoke with patient's wife regarding PT recommendation of SNF at discharge. She states that she would like for patient to discharge home if possible. She did give CSW permission to send SNF referral just in case she feels patient ends up needing SNF. CSW relayed information regarding insurance and home health services. CSW to continue to follow for needs.    Social Worker assessment / plan:  At this point, patient's wife reports wanting to take patient home at discharge.   Employment status:  Retired Nurse, adult PT Recommendations:  [place] / Referral to community resources:  [place]  Patient/Family's Response to care:  Patient's wife expressed appreciation for CSW assistance and is glad that patient was at least able to stand with PT.  Patient/Family's Understanding of and Emotional Response to Diagnosis, Current Treatment, and  Prognosis:  Patient/family is realistic regarding therapy needs and expressed being hopeful for return home. Patient's spouse expressed understanding of CSW role and discharge process. No questions/concerns about plan or treatment.    Emotional Assessment Appearance:  Appears stated age Attitude/Demeanor/Rapport:  Unable to Assess Affect (typically observed):  Unable to Assess Orientation:  Oriented to Self Alcohol / Substance use:  Not Applicable Psych involvement (Current and /or in the community):  No (Comment)  Discharge Needs  Concerns to be addressed:  Care Coordination Readmission within the last 30 days:  No Current discharge risk:  None Barriers to Discharge:  Continued Medical Work up   [employer], [address] 09/24/2016, 9:27 AM

## 2016-09-25 DIAGNOSIS — R7881 Bacteremia: Secondary | ICD-10-CM

## 2016-09-25 DIAGNOSIS — B9620 Unspecified Escherichia coli [E. coli] as the cause of diseases classified elsewhere: Secondary | ICD-10-CM

## 2016-09-25 LAB — CBC WITH DIFFERENTIAL/PLATELET
Basophils Absolute: 0 10*3/uL (ref 0.0–0.1)
Basophils Relative: 0 %
Eosinophils Absolute: 0.1 10*3/uL (ref 0.0–0.7)
Eosinophils Relative: 0 %
HEMATOCRIT: 45.3 % (ref 39.0–52.0)
Hemoglobin: 15.2 g/dL (ref 13.0–17.0)
LYMPHS ABS: 3 10*3/uL (ref 0.7–4.0)
LYMPHS PCT: 21 %
MCH: 31.5 pg (ref 26.0–34.0)
MCHC: 33.6 g/dL (ref 30.0–36.0)
MCV: 94 fL (ref 78.0–100.0)
MONO ABS: 0.8 10*3/uL (ref 0.1–1.0)
MONOS PCT: 5 %
NEUTROS ABS: 10.8 10*3/uL — AB (ref 1.7–7.7)
Neutrophils Relative %: 74 %
Platelets: 342 10*3/uL (ref 150–400)
RBC: 4.82 MIL/uL (ref 4.22–5.81)
RDW: 14.1 % (ref 11.5–15.5)
WBC: 14.7 10*3/uL — ABNORMAL HIGH (ref 4.0–10.5)

## 2016-09-25 LAB — GLUCOSE, CAPILLARY
GLUCOSE-CAPILLARY: 165 mg/dL — AB (ref 65–99)
GLUCOSE-CAPILLARY: 228 mg/dL — AB (ref 65–99)
Glucose-Capillary: 204 mg/dL — ABNORMAL HIGH (ref 65–99)
Glucose-Capillary: 212 mg/dL — ABNORMAL HIGH (ref 65–99)
Glucose-Capillary: 225 mg/dL — ABNORMAL HIGH (ref 65–99)
Glucose-Capillary: 229 mg/dL — ABNORMAL HIGH (ref 65–99)

## 2016-09-25 LAB — COMPREHENSIVE METABOLIC PANEL
ALK PHOS: 171 U/L — AB (ref 38–126)
ALT: 59 U/L (ref 17–63)
ANION GAP: 10 (ref 5–15)
AST: 32 U/L (ref 15–41)
Albumin: 2.9 g/dL — ABNORMAL LOW (ref 3.5–5.0)
BILIRUBIN TOTAL: 0.5 mg/dL (ref 0.3–1.2)
BUN: 16 mg/dL (ref 6–20)
CALCIUM: 9.4 mg/dL (ref 8.9–10.3)
CO2: 23 mmol/L (ref 22–32)
CREATININE: 1.2 mg/dL (ref 0.61–1.24)
Chloride: 110 mmol/L (ref 101–111)
GFR calc non Af Amer: 53 mL/min — ABNORMAL LOW (ref >60)
GLUCOSE: 204 mg/dL — AB (ref 65–99)
Potassium: 4.1 mmol/L (ref 3.5–5.1)
Sodium: 143 mmol/L (ref 135–145)
TOTAL PROTEIN: 6.3 g/dL — AB (ref 6.5–8.1)

## 2016-09-25 LAB — MAGNESIUM: Magnesium: 2.2 mg/dL (ref 1.7–2.4)

## 2016-09-25 NOTE — Progress Notes (Signed)
Speech Language Pathology Treatment: Dysphagia  Patient Details Name: [name] MRN: [id number] DOB: 07-14-1927 Today's Date: 09/25/2016 Time: [id number] SLP Time Calculation (min) (ACUTE ONLY): 25 min  Assessment / Plan / Recommendation Clinical Impression  F/u for dysphagia s/p MBS.  Pt continues to tolerate dysphagia 1, honey-thick liquids with min cues for safety.  According to his wife, he has frequently been requesting Pepsi and well water (unthickened).  We reviewed results of MBS, including pt's ongoing aspiration.   We discussed dementia and its progressive impact on swallowing safety.  We discussed aspiration, and our efforts to minimize it but our inability to prevent it. Mrs. Kellar appeared to have improved understanding as the session progressed.  She mixed liquid to the appropriate consistency and we reviewed where she can purchase more.  Mrs. Labine will benefit from further education re: dementia and swallowing.  For now, continue current diet.  SLP will follow.    HPI HPI: 83 y.o.WMPMHxDementia in Alzheimer's disease with delirium CAD S/P stenting, 2d degreeAV block Mobitz type II S/P pacemaker placement, DM type 2, Chronic Diastolic CHF, HTN, HLD, COPD. Brought to the ER afterpatient was found to be weak and confused. Patient has known history of CBD stones with recurrent obstruction. Has had stents placed previously. CT abdomen and pelvis was done which shows dilated duct with infrarenal aortic aneurysm.       SLP Plan  Continue with current plan of care     Recommendations  Diet recommendations: Dysphagia 1 (puree);Honey-thick liquid Liquids provided via: Cup Medication Administration: Crushed with puree Supervision: Patient able to self feed;Staff to assist with self feeding Compensations: Slow rate;Small sips/bites Postural Changes and/or Swallow Maneuvers: Seated upright 90 degrees                Oral Care Recommendations: Oral care BID Follow up  Recommendations: 24 hour supervision/assistance Plan: Continue with current plan of care       GO                Juan Quam Laurice 09/25/2016, 11:33 AM

## 2016-09-25 NOTE — Progress Notes (Signed)
PROGRESS NOTE    [name]  [id number] DOB: 05-13-1927 DOA: 09/20/2016 PCP: Stephens Shire, MD   Brief Narrative:  83 y.o. 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 As per patient's wife patient was getting weak over the last 2 days and today while walking to the dining table patient's slumped to the. Did not hit his head or lose consciousness. In the ER patient was found to be hypotensive with labs showing markedly elevated LFTs and Lactate. Patient has known history of CBD stones with recurrent obstruction. Has had stents placed previously. CT abdomen and pelvis was done which shows dilated duct with infrarenal aortic aneurysm. Dr. Benson Norway, patient's gastroenterologist was consulted and since patient has difficult ERCP previously IR has been consulted for drain placement and has been placed by the time I examined the patient. On my exam patient appears confused and moves all extremities.   Subjective: 1/2 arousable but does not answer questions. Sitting comfortably in chair. Initially wife states they want to take patient home instead of SNF.     Assessment & Plan:   Principal Problem:   Sepsis ([place]) Active Problems:   Coronary artery disease   DM2 (diabetes mellitus, type 2) (HCC)   Systolic CHF, chronic (HCC)   COPD with emphysema (HCC)   Pacemaker-[employer]   Acute cholangitis   Acute encephalopathy   CAD in native artery   AV block, Mobitz II   Uncontrolled type 2 diabetes mellitus with complication ([place])   Dementia associated with other underlying disease with behavioral disturbance   Chronic diastolic CHF (congestive heart failure) (HCC)   Cholangitis  Sepsis/Cholangitis -S/P IR placement Biliary drain  -Spoke with Dr. Michel Bickers ID, who recommends  continuing current antibiotics for 10 days and patient doing well discontinuing. At that time IR would be able to replace stent    positive E.coli/Enterobacteriaceae/KLEBSIELLA PNEUMONIAE bacteremia -12/28 blood culture. Continue current antibiotic awaiting susceptibilities  -Repeat blood cultures on 09/24/2016  Acute on Chronic Encephalopathy -Per wife patient A/O 1 most of the time - CT head was unremarkable -Ativan PRN for behavioral disturbances: Use sitter as much as possible and limit sedating medication   Chronic Diastolic CHF -Strict I&O since admission -4.7 L -Daily weight Filed Weights   09/23/16 0406 09/24/16 0331 09/25/16 0400  Weight: 83.8 kg (184 lb 11.9 oz) 80.5 kg (177 lb 7.5 oz) 79.2 kg (174 lb 9.7 oz)  -Increase Metoprolol 25 mg BID (home dose 50 mg ).  CAD S/P stenting -Asymptomatic continue to monitor  2d degree AV block Mobitz type II S/P Pacemaker placement.  DM type II uncontrolled with complications -7/4 Hemoglobin A1c= 7.9 -Sensitive SSI  Hypokalemia -Potassium goal>  Hypomagnesemia -Magnesium goal> 2     Goals of care -PT/OT: Recommend SNF -1/2 after long conversation with patient and wife and family concerning limited resources available to them at home they would like to speak with LCSW concerning CIR vs SNF placement. Have placed consult request.     DVT prophylaxis: SCD Code Status: DO NOT RESUSCITATE Family Communication: None Disposition Plan: SNF   Consultants:  Phone consult Dr. Michel Bickers ID   Procedures/Significant Events:  12/28 CT abdomen and pelvis W contrast:- Diffuse hepatic steatosis.  -S/Pcholecystectomy.  -Stable biliary ductal dilatation with common bile  duct diameter 16 mm.  -Stable well-positioned biliary stent in the lower third of the common bile duct with expected pneumobilia. -Stable 3.6 cm infrarenal abdominal aortic aneurysm.  12/28 CT head WO contrast:-Negative acute infarct -Generalized cerebral  volume loss and moderate chronic small vessel ischemia.   VENTILATOR SETTINGS:    Cultures 1/28 urine pending 12/28 blood 2 positive Escherichia coli/Enterobacteriaceae/KLEBSIELLA PNEUMONIAE 09/24/2016 blood 2 NGTD    Antimicrobials: Anti-infectives    Start     Stop   09/21/16 1200  levofloxacin (LEVAQUIN) IVPB 500 mg  Status:  Discontinued     09/21/16 1143   09/21/16 1145  cefTRIAXone (ROCEPHIN) 2 g in dextrose 5 % 50 mL IVPB         09/21/16 0800  metroNIDAZOLE (FLAGYL) IVPB 500 mg         09/21/16 0000  levofloxacin (LEVAQUIN) IVPB 750 mg  Status:  Discontinued     09/21/16 0005   09/21/16 0000  metroNIDAZOLE (FLAGYL) IVPB 500 mg     09/21/16 0302   09/20/16 1700  metroNIDAZOLE (FLAGYL) IVPB 500 mg     09/20/16 1815   09/20/16 1630  cefTRIAXone (ROCEPHIN) 1 g in dextrose 5 % 50 mL IVPB     09/20/16 1658   09/20/16 1630  azithromycin (ZITHROMAX) 500 mg in dextrose 5 % 250 mL IVPB     09/20/16 1727       Devices    LINES / TUBES:      Continuous Infusions:    Objective: Vitals:   09/25/16 0700 09/25/16 0736 09/25/16 0922 09/25/16 1200  BP:  127/79 120/69 125/75  Pulse:  90 (!) 105 93  Resp:  (!) 23  (!) 21  Temp:  97.7 F (36.5 C)  98.5 F (36.9 C)  TempSrc: Oral Oral  Axillary  SpO2:  100%  98%  Weight:      Height:        Intake/Output Summary (Last 24 hours) at 09/25/16 1523 Last data filed at 09/25/16 1300  Gross per 24 hour  Intake              440 ml  Output             2575 ml  Net            -2135 ml   Filed Weights   09/23/16 0406 09/24/16 0331 09/25/16 0400  Weight: 83.8 kg (184 lb 11.9 oz) 80.5 kg (177 lb 7.5 oz) 79.2 kg (174 lb 9.7 oz)    Examination:  General:  Sleeping comfortably in chair, does not follow commands, No acute respiratory distress Eyes: negative scleral hemorrhage, negative anisocoria, negative icterus ENT: Negative Runny nose, negative gingival bleeding, Neck:  Negative scars, masses, torticollis,  lymphadenopathy, JVD Lungs: Clear to auscultation bilaterally without wheezes or crackles Cardiovascular: Regular rate and rhythm without murmur gallop or rub normal S1 and S2 Abdomen: Obese, negative abdominal pain, nondistended, positive soft, bowel sounds, no rebound, no ascites, no appreciable mass, right biliary drain present (draining significant amount of brownish discharge) Extremities: No significant cyanosis, clubbing, or edema bilateral lower extremities Skin: Negative rashes, lesions, ulcers Psychiatric:  Unable to fully evaluate secondary to dementia Central nervous system:  Unable to fully evaluate.  .     Data Reviewed: Care during the described time interval was provided by me .  I have reviewed this patient's available data, including medical history, events of note, physical examination, and all test results as part of my evaluation.  I have personally reviewed and interpreted all radiology studies.  CBC:  Recent Labs Lab 09/20/16 1550 09/21/16 0432 09/22/16 0925 09/24/16 0548 09/25/16 0229  WBC 16.0* 15.5* 8.1 10.0 14.7*  NEUTROABS 14.8* 12.1*  --  7.5 10.8*  HGB 14.4 12.3* 11.9* 13.9 15.2  HCT 42.1 36.5* 35.5* 41.9 45.3  MCV 93.3 93.8 92.7 95.2 94.0  PLT 228 249 225 319 [id number]   Basic Metabolic Panel:  Recent Labs Lab 09/20/16 1550 09/21/16 0853 09/22/16 0925 09/24/16 0548 09/25/16 0229  NA 135 140 139 144 143  K 4.0 3.9 3.3* 4.6 4.1  CL 100* 111 112* 111 110  CO2 22 20* 18* 20* 23  GLUCOSE 273* 131* 151* 145* 204*  BUN 17 14 12 14 16   CREATININE 1.49* 1.23 1.16 1.20 1.20  CALCIUM 8.8* 7.9* 8.4* 9.0 9.4  MG  --   --  1.7 2.2 2.2   GFR: Estimated Creatinine Clearance (by C-G formula based on SCr of 1.2 mg/dL) Male: 35.1 mL/min Male: 42.9 mL/min Liver Function Tests:  Recent Labs Lab 09/20/16 1550 09/21/16 0853 09/22/16 0925 09/25/16 0229  AST 964* 403* 153* 32  ALT 308* 232* 156* 59  ALKPHOS 314* 189* 176* 171*  BILITOT 3.5* 3.0* 1.1  0.5  PROT 5.8* 4.5* 5.1* 6.3*  ALBUMIN 2.8* 2.2* 2.3* 2.9*   No results for input(s): LIPASE, AMYLASE in the last 168 hours. No results for input(s): AMMONIA in the last 168 hours. Coagulation Profile:  Recent Labs Lab 09/20/16 2029 09/21/16 0109  INR 1.23 1.06   Cardiac Enzymes: No results for input(s): CKTOTAL, CKMB, CKMBINDEX, TROPONINI in the last 168 hours. BNP (last 3 results) No results for input(s): PROBNP in the last 8760 hours. HbA1C: No results for input(s): HGBA1C in the last 72 hours. CBG:  Recent Labs Lab 09/24/16 1958 09/24/16 2344 09/25/16 0419 09/25/16 0741 09/25/16 1209  GLUCAP 180* 169* 204* 165* 225*   Lipid Profile: No results for input(s): CHOL, HDL, LDLCALC, TRIG, CHOLHDL, LDLDIRECT in the last 72 hours. Thyroid Function Tests: No results for input(s): TSH, T4TOTAL, FREET4, T3FREE, THYROIDAB in the last 72 hours. Anemia Panel: No results for input(s): VITAMINB12, FOLATE, FERRITIN, TIBC, IRON, RETICCTPCT in the last 72 hours. Urine analysis:    Component Value Date/Time   COLORURINE AMBER (A) 09/20/2016 1546   APPEARANCEUR HAZY (A) 09/20/2016 1546   LABSPEC 1.014 09/20/2016 1546   PHURINE 7.0 09/20/2016 1546   GLUCOSEU 150 (A) 09/20/2016 1546   HGBUR NEGATIVE 09/20/2016 1546   BILIRUBINUR SMALL (A) 09/20/2016 1546   KETONESUR NEGATIVE 09/20/2016 1546   PROTEINUR 100 (A) 09/20/2016 1546   UROBILINOGEN 2.0 (H) 06/14/2015 0840   NITRITE NEGATIVE 09/20/2016 1546   LEUKOCYTESUR NEGATIVE 09/20/2016 1546   Sepsis Labs: @LABRCNTIP (procalcitonin:4,lacticidven:4)  ) Recent Results (from the past 240 hour(s))  Urine culture     Status: Abnormal   Collection Time: 09/20/16  3:46 PM  Result Value Ref Range Status   Specimen Description URINE, CLEAN CATCH  Final   Special Requests NONE  Final   Culture MULTIPLE SPECIES PRESENT, SUGGEST RECOLLECTION (A)  Final   Report Status 09/21/2016 FINAL  Final  Blood Culture (routine x 2)     Status:  Abnormal   Collection Time: 09/20/16  3:50 PM  Result Value Ref Range Status   Specimen Description BLOOD LEFT ANTECUBITAL  Final   Special Requests BOTTLES DRAWN AEROBIC AND ANAEROBIC 5CC  Final   Culture  Setup Time   Final  GRAM NEGATIVE RODS AEROBIC BOTTLE ONLY CRITICAL RESULT CALLED TO, READ BACK BY AND VERIFIED WITH: A JOHNSTON,PHARMD AT [id number] 09/21/16 BY L BENFIELD    Culture ESCHERICHIA COLI (A)  Final   Report Status 09/23/2016 FINAL  Final   Organism ID, Bacteria ESCHERICHIA COLI  Final      Susceptibility   Escherichia coli - MIC*    AMPICILLIN >=32 RESISTANT Resistant     CEFAZOLIN >=64 RESISTANT Resistant     CEFEPIME <=1 SENSITIVE Sensitive     CEFTAZIDIME <=1 SENSITIVE Sensitive     CEFTRIAXONE <=1 SENSITIVE Sensitive     CIPROFLOXACIN >=4 RESISTANT Resistant     GENTAMICIN >=16 RESISTANT Resistant     IMIPENEM <=0.25 SENSITIVE Sensitive     TRIMETH/SULFA >=320 RESISTANT Resistant     AMPICILLIN/SULBACTAM >=32 RESISTANT Resistant     PIP/TAZO >=128 RESISTANT Resistant     Extended ESBL NEGATIVE Sensitive     * ESCHERICHIA COLI  Blood Culture ID Panel (Reflexed)     Status: Abnormal   Collection Time: 09/20/16  3:50 PM  Result Value Ref Range Status   Enterococcus species NOT DETECTED NOT DETECTED Final   Listeria monocytogenes NOT DETECTED NOT DETECTED Final   Staphylococcus species NOT DETECTED NOT DETECTED Final   Staphylococcus aureus NOT DETECTED NOT DETECTED Final   Streptococcus species NOT DETECTED NOT DETECTED Final   Streptococcus agalactiae NOT DETECTED NOT DETECTED Final   Streptococcus pneumoniae NOT DETECTED NOT DETECTED Final   Streptococcus pyogenes NOT DETECTED NOT DETECTED Final   Acinetobacter baumannii NOT DETECTED NOT DETECTED Final   Enterobacteriaceae species DETECTED (A) NOT DETECTED Final    Comment: CRITICAL RESULT CALLED TO, READ BACK BY AND VERIFIED WITH: A JOHNSTON,PHARMD AT 0841 09/21/16 BY L BENFIELD    Enterobacter cloacae  complex NOT DETECTED NOT DETECTED Final   Escherichia coli DETECTED (A) NOT DETECTED Final    Comment: CRITICAL RESULT CALLED TO, READ BACK BY AND VERIFIED WITH: A JOHNSTON,PHARMD AT 0841 09/21/16 BY L BENFIELD    Klebsiella oxytoca NOT DETECTED NOT DETECTED Final   Klebsiella pneumoniae NOT DETECTED NOT DETECTED Final   Proteus species NOT DETECTED NOT DETECTED Final   Serratia marcescens NOT DETECTED NOT DETECTED Final   Carbapenem resistance NOT DETECTED NOT DETECTED Final   Haemophilus influenzae NOT DETECTED NOT DETECTED Final   Neisseria meningitidis NOT DETECTED NOT DETECTED Final   Pseudomonas aeruginosa NOT DETECTED NOT DETECTED Final   Candida albicans NOT DETECTED NOT DETECTED Final   Candida glabrata NOT DETECTED NOT DETECTED Final   Candida krusei NOT DETECTED NOT DETECTED Final   Candida parapsilosis NOT DETECTED NOT DETECTED Final   Candida tropicalis NOT DETECTED NOT DETECTED Final  Blood Culture (routine x 2)     Status: None (Preliminary result)   Collection Time: 09/20/16  3:55 PM  Result Value Ref Range Status   Specimen Description BLOOD RIGHT ANTECUBITAL  Final   Special Requests BOTTLES DRAWN AEROBIC AND ANAEROBIC 5CC  Final   Culture  Setup Time   Final    GRAM NEGATIVE RODS AEROBIC BOTTLE ONLY CRITICAL VALUE NOTED.  VALUE IS CONSISTENT WITH PREVIOUSLY REPORTED AND CALLED VALUE.    Culture GRAM NEGATIVE RODS IDENTIFICATION TO FOLLOW   Final   Report Status PENDING  Incomplete  MRSA PCR Screening     Status: None   Collection Time: 09/21/16 12:27 AM  Result Value Ref Range Status   MRSA by PCR NEGATIVE NEGATIVE Final    Comment:  The GeneXpert MRSA Assay (FDA approved for NASAL specimens only), is one component of a comprehensive MRSA colonization surveillance program. It is not intended to diagnose MRSA infection nor to guide or monitor treatment for MRSA infections.   Culture, blood (routine x 2)     Status: None (Preliminary result)    Collection Time: 09/24/16  5:48 AM  Result Value Ref Range Status   Specimen Description BLOOD RIGHT ANTECUBITAL  Final   Special Requests IN PEDIATRIC BOTTLE 2CC  Final   Culture NO GROWTH 1 DAY  Final   Report Status PENDING  Incomplete  Culture, blood (routine x 2)     Status: None (Preliminary result)   Collection Time: 09/24/16  5:48 AM  Result Value Ref Range Status   Specimen Description BLOOD BLOOD RIGHT HAND  Final   Special Requests IN PEDIATRIC BOTTLE 2CC  Final   Culture NO GROWTH 1 DAY  Final   Report Status PENDING  Incomplete         Radiology Studies: No results found.      Scheduled Meds: . cefTRIAXone (ROCEPHIN)  IV  2 g Intravenous Q24H  . fluticasone furoate-vilanterol  1 puff Inhalation Daily  . insulin aspart  0-9 Units Subcutaneous Q4H  . metoprolol tartrate  25 mg Oral BID  . metronidazole  500 mg Intravenous Q8H   Continuous Infusions:    LOS: 5 days    Time spent: 40 minutes    Sultan Pargas, Geraldo Docker, MD [facility] Pager [phone]   If 7PM-7AM, please contact night-coverage [email or web address] Password [facility] 09/25/2016, 3:23 PM

## 2016-09-25 NOTE — Progress Notes (Signed)
CSW informed during progression that patient wife will be taking patient home at time of DC  CSW signing off- please reconsult if needed  Jorge Ny, [place] Social Worker [phone]

## 2016-09-25 NOTE — Progress Notes (Signed)
PHARMACY - PHYSICIAN COMMUNICATION CRITICAL VALUE ALERT - BLOOD CULTURE   * Prior BCID + and blood culture on 12/28 EColi (non ESBL, sensitive to cephalosporins and imipenem but resistant to cipro and Zosyn). Patient is currently on Ceftriaxone.  Now, lab calling as second blood culture on 12/28 is positive for Klebsiella which was not detected on BCID. Sensitivities are pending.   Discussed options: - Consider Merrem if want cover ESBL until results back - Otherwise CTX has good susceptibility if clinically doing well  Name of physician (or Provider) Contacted: Dr. Sherral Hammers  Changes to prescribed antibiotics required: Discussed case, continue with Ceftriaxone for now until susceptibilities are back.   Sloan Leiter, PharmD, BCPS Clinical Pharmacist [phone] until 11 PM tonight [phone] after hours 09/25/2016  4:07 PM

## 2016-09-26 LAB — GLUCOSE, CAPILLARY
GLUCOSE-CAPILLARY: 169 mg/dL — AB (ref 65–99)
GLUCOSE-CAPILLARY: 180 mg/dL — AB (ref 65–99)
GLUCOSE-CAPILLARY: 215 mg/dL — AB (ref 65–99)
Glucose-Capillary: 182 mg/dL — ABNORMAL HIGH (ref 65–99)
Glucose-Capillary: 216 mg/dL — ABNORMAL HIGH (ref 65–99)
Glucose-Capillary: 222 mg/dL — ABNORMAL HIGH (ref 65–99)

## 2016-09-26 LAB — CBC WITH DIFFERENTIAL/PLATELET
BASOS PCT: 0 %
Basophils Absolute: 0 10*3/uL (ref 0.0–0.1)
EOS PCT: 0 %
Eosinophils Absolute: 0 10*3/uL (ref 0.0–0.7)
HEMATOCRIT: 46.8 % (ref 39.0–52.0)
Hemoglobin: 15.4 g/dL (ref 13.0–17.0)
LYMPHS ABS: 2.7 10*3/uL (ref 0.7–4.0)
Lymphocytes Relative: 19 %
MCH: 31.8 pg (ref 26.0–34.0)
MCHC: 32.9 g/dL (ref 30.0–36.0)
MCV: 96.5 fL (ref 78.0–100.0)
MONOS PCT: 4 %
Monocytes Absolute: 0.6 10*3/uL (ref 0.1–1.0)
Neutro Abs: 11 10*3/uL — ABNORMAL HIGH (ref 1.7–7.7)
Neutrophils Relative %: 77 %
Platelets: 249 10*3/uL (ref 150–400)
RBC: 4.85 MIL/uL (ref 4.22–5.81)
RDW: 14.6 % (ref 11.5–15.5)
WBC: 14.3 10*3/uL — AB (ref 4.0–10.5)

## 2016-09-26 LAB — COMPREHENSIVE METABOLIC PANEL
ALT: 36 U/L (ref 17–63)
AST: 39 U/L (ref 15–41)
Albumin: 2.9 g/dL — ABNORMAL LOW (ref 3.5–5.0)
Alkaline Phosphatase: 145 U/L — ABNORMAL HIGH (ref 38–126)
Anion gap: 14 (ref 5–15)
BUN: 36 mg/dL — AB (ref 6–20)
CHLORIDE: 111 mmol/L (ref 101–111)
CO2: 21 mmol/L — AB (ref 22–32)
CREATININE: 3.49 mg/dL — AB (ref 0.61–1.24)
Calcium: 9.4 mg/dL (ref 8.9–10.3)
GFR calc Af Amer: 17 mL/min — ABNORMAL LOW (ref >60)
GFR calc non Af Amer: 14 mL/min — ABNORMAL LOW (ref >60)
GLUCOSE: 244 mg/dL — AB (ref 65–99)
Potassium: 4.3 mmol/L (ref 3.5–5.1)
SODIUM: 146 mmol/L — AB (ref 135–145)
Total Bilirubin: 0.8 mg/dL (ref 0.3–1.2)
Total Protein: 6 g/dL — ABNORMAL LOW (ref 6.5–8.1)

## 2016-09-26 LAB — CULTURE, BLOOD (ROUTINE X 2)

## 2016-09-26 LAB — MAGNESIUM: Magnesium: 2.3 mg/dL (ref 1.7–2.4)

## 2016-09-26 MED ORDER — METOPROLOL TARTRATE 25 MG/10 ML ORAL SUSPENSION
12.5000 mg | Freq: Two times a day (BID) | ORAL | Status: DC
  Filled 2016-09-26 (×2): qty 10

## 2016-09-26 MED ORDER — SODIUM CHLORIDE 0.9 % IV SOLN
INTRAVENOUS | Status: AC
  Administered 2016-09-26 – 2016-09-27 (×2): via INTRAVENOUS

## 2016-09-26 MED ORDER — SODIUM CHLORIDE 0.9 % IV BOLUS (SEPSIS)
500.0000 mL | Freq: Once | INTRAVENOUS | Status: AC
  Administered 2016-09-26: 500 mL via INTRAVENOUS

## 2016-09-26 NOTE — Progress Notes (Signed)
CSW informed by speech therapy that patient wife now agreeable to SNF- pt was already worked up earlier in the week so CSW met with wife and provided bed offers- wife hopeful for [facility] who had not responded yet- CSW called facility and made sure they had referral.  CSW will continue to follow  Jorge Ny, [place] Social Worker [phone]

## 2016-09-26 NOTE — Progress Notes (Signed)
Referring Physician(s): DR Ronnie Derby  Supervising Physician: Daryll Brod  Patient Status:  [facility] - In-pt  Chief Complaint:  Biliary drain placed 12/28  Subjective:  Planned for stent exchange with Dr Benson Norway per his note Confirmed with wife---for 100 pm tomorrow per wife  Pt seems comfortable No real response from him today Wife at bedside Bili drain intact Output significant per chart 150 cc in bag now   Allergies: Doxycycline and Penicillins  Medications: Prior to Admission medications   Medication Sig Start Date End Date Taking? Authorizing Provider  aspirin 325 MG tablet Take 162.5 mg by mouth daily.    Yes Historical Provider, MD  fluticasone furoate-vilanterol (BREO ELLIPTA) 100-25 MCG/INH AEPB Inhale 1 puff into the lungs daily. 12/21/15  Yes Chesley Mires, MD  folic acid (FOLVITE) 1 MG tablet Take 1 mg by mouth daily.   Yes Historical Provider, MD  hydrochlorothiazide (HYDRODIURIL) 25 MG tablet Take 12.5 mg by mouth every morning.    Yes Historical Provider, MD  losartan (COZAAR) 50 MG tablet Take 50 mg by mouth every morning.   Yes Historical Provider, MD  MAGNESIUM SULFATE PO Take 1 tablet by mouth daily.   Yes Historical Provider, MD  metoprolol (LOPRESSOR) 50 MG tablet Take 1 tablet (50 mg total) by mouth 2 (two) times daily. 10/14/15  Yes [name], MD  Multiple Vitamins-Minerals (CENTRUM SILVER PO) Take 1 tablet by mouth every morning.    Yes Historical Provider, MD  nitroGLYCERIN (NITROSTAT) 0.4 MG SL tablet Place 0.4 mg under the tongue every 5 (five) minutes x 3 doses as needed for chest pain.    Yes Historical Provider, MD  Omega-3 Fatty Acids (FISH OIL PO) Take 2 capsules by mouth daily.   Yes Historical Provider, MD  omeprazole (PRILOSEC) 20 MG capsule Take 20 mg by mouth daily before breakfast.  03/01/15  Yes Historical Provider, MD  polyethylene glycol (MIRALAX / GLYCOLAX) packet Take 17 g by mouth daily.   Yes Historical Provider, MD  simvastatin  (ZOCOR) 20 MG tablet Take 20 mg by mouth every evening.    Yes Historical Provider, MD     Vital Signs: BP (!) 89/70 (BP Location: Right Arm)   Pulse (!) 108   Temp 99.2 F (37.3 C) (Axillary)   Resp (!) 26   Ht 5\' 6"  (1.676 m)   Wt 171 lb 4.8 oz (77.7 kg)   SpO2 96%   BMI 27.65 kg/m   Physical Exam  Abdominal: Soft.  Skin: Skin is warm and dry.  Site of bili drain is clean and dry NT No bleeding OP bilious: 3.2L recorded yesterday 150 cc in bag now  LFTs and TB normalized afeb   Nursing note and vitals reviewed.   Imaging: No results found.  Labs:  CBC:  Recent Labs  09/22/16 0925 09/24/16 0548 09/25/16 0229 09/26/16 0212  WBC 8.1 10.0 14.7* 14.3*  HGB 11.9* 13.9 15.2 15.4  HCT 35.5* 41.9 45.3 46.8  PLT 225 319 342 249    COAGS:  Recent Labs  09/20/16 2029 09/21/16 0109  INR 1.23 1.06  APTT  --  25    BMP:  Recent Labs  09/21/16 0853 09/22/16 0925 09/24/16 0548 09/25/16 0229  NA 140 139 144 143  K 3.9 3.3* 4.6 4.1  CL 111 112* 111 110  CO2 20* 18* 20* 23  GLUCOSE 131* 151* 145* 204*  BUN 14 12 14 16   CALCIUM 7.9* 8.4* 9.0 9.4  CREATININE 1.23  1.16 1.20 1.20  GFRNONAA 51* 55* 53* 53*  GFRAA 59* >60 >60 >60    LIVER FUNCTION TESTS:  Recent Labs  09/20/16 1550 09/21/16 0853 09/22/16 0925 09/25/16 0229  BILITOT 3.5* 3.0* 1.1 0.5  AST 964* 403* 153* 32  ALT 308* 232* 156* 59  ALKPHOS 314* 189* 176* 171*  PROT 5.8* 4.5* 5.1* 6.3*  ALBUMIN 2.8* 2.2* 2.3* 2.9*    Assessment and Plan:  Biliary obstruction; sepsis Drain placed 12/28 Output great For possible biliary stent exchange with Dr Benson Norway 09/27/16 Removal of I/E biliary drain? Plan per Dr Benson Norway  Electronically Signed: Monia Sabal A 09/26/2016, 1:27 PM   I spent a total of 15 Minutes at the the patient's bedside AND on the patient's hospital floor or unit, greater than 50% of which was counseling/coordinating care for biliary drain

## 2016-09-26 NOTE — Progress Notes (Signed)
Speech Language Pathology Treatment: Dysphagia  Patient Details Name: [name] MRN: [id number] DOB: 01/21/1927 Today's Date: 09/26/2016 Time: [id number] SLP Time Calculation (min) (ACUTE ONLY): 17 min  Assessment / Plan / Recommendation Clinical Impression  Pt more confused today, slightly agitated, demanding tap water.  Wife present.  Assisted with self-feeding honey-thick liquids, purees, which were tolerated without incident, no overt s/s of aspiration.  Reiterated main points from yesterday's discussion, primarily that at some point [name] will want to decide if pt should remain on restrictive diet with thickened liquids vs accepting risk of aspiration and allowing pt to eat/drink per his preferences.  She verbalizes understanding.  For now, wants to keep him on dysphagia 1, honey-thick liquids.  She does prefer transition to SNF for rehab prior to taking him home. SLP will follow.    HPI HPI: 83 y.o.WMPMHxDementia in Alzheimer's disease with delirium CAD S/P stenting, 2d degreeAV block Mobitz type II S/P pacemaker placement, DM type 2, Chronic Diastolic CHF, HTN, HLD, COPD. Brought to the ER afterpatient was found to be weak and confused. Patient has known history of CBD stones with recurrent obstruction. Has had stents placed previously. CT abdomen and pelvis was done which shows dilated duct with infrarenal aortic aneurysm.       SLP Plan  Continue with current plan of care     Recommendations  Diet recommendations: Dysphagia 1 (puree);Honey-thick liquid Liquids provided via: Cup Medication Administration: Crushed with puree Supervision: Patient able to self feed;Staff to assist with self feeding Compensations: Slow rate;Small sips/bites Postural Changes and/or Swallow Maneuvers: Seated upright 90 degrees                Oral Care Recommendations: Oral care BID Follow up Recommendations: 24 hour supervision/assistance Plan: Continue with current plan of  care       GO                [name] 09/26/2016, 12:17 PM  [name] [name], [place] CCC/SLP Pager [phone]

## 2016-09-26 NOTE — Progress Notes (Addendum)
Physical Therapy Treatment Patient Details Name: [name] MRN: [id number] DOB: Sep 10, 1927 Today's Date: 09/26/2016    History of Present Illness 83 y.o. male with history of CAD status post stenting, second degree AV block status post pacemaker placement, diabetes mellitus type 2, CHF, COPD, dementia was brought to the ER after patient was found to be weak and confused.     PT Comments    Patient seen in conjunction with OT therapist for therapeutic activity progression. Patient tolerated bed level exercise and chair positioning in bed for cognitive and functional task performance. Deferred EOB and OOB today due to low BP 80s/60s, improved to 100/70s with activity in chair position. Will continue to see and progress activity as tolerated.  Follow Up Recommendations  SNF;Supervision/Assistance - 24 hour     Equipment Recommendations  Wheelchair (measurements PT);Wheelchair cushion (measurements PT)    Recommendations for Other Services       Precautions / Restrictions Precautions Precautions: (P) Fall Precaution Comments: (P) biliary drain Rt flank Restrictions Weight Bearing Restrictions: No    Mobility  Bed Mobility Overal bed mobility: Needs Assistance Bed Mobility: Supine to Sit;Sit to Supine           General bed mobility comments: using mechanical features of bed, deferred EOB due to low BP  Transfers                    Ambulation/Gait             General Gait Details: NT   Stairs            Wheelchair Mobility    Modified Rankin (Stroke Patients Only)       Balance                                    Cognition Arousal/Alertness: (P) Lethargic (requires stimulation to remain aroused) Behavior During Therapy: (P) Flat affect Overall Cognitive Status: (P) No family/caregiver present to determine baseline cognitive functioning                 General Comments: (P) Known hx of dementia but anticipate pt  currently not at his baseline cognitively. Pt intermittently following one step commands and requires consistent cues for initiation and sequencing of activities.    Exercises Other Exercises Other Exercises: Bilateral LEs PROM and AAROM knee flexion/extension Other Exercises: Bilateral LE PROM and AAROM Ankle pumps Other Exercises: PROM hip flexion Other Exercises: UE functional task performance for cognition and self care    General Comments        Pertinent Vitals/Pain Pain Assessment: (P) Faces Faces Pain Scale: (P) No hurt    Home Living Family/patient expects to be discharged to:: Private residence Living Arrangements: Spouse/significant other Available Help at Discharge: Family;Available 24 hours/day Type of Home: House Home Access: Stairs to enter Entrance Stairs-Rails: Right;Left Home Layout: Multi-level Home Equipment: Environmental consultant - 2 wheels;Cane - single point;Grab bars - toilet Additional Comments: Patient unable to provide home set up information.  Above information from medical record.    Prior Function        Comments: Unsure. Pt unable to provide and no family present.   PT Goals (current goals can now be found in the care plan section) Acute Rehab PT Goals Patient Stated Goal: Unable to state PT Goal Formulation: Patient unable to participate in goal setting Time For Goal Achievement: 10/07/16 Potential to Achieve Goals:  Fair Progress towards PT goals: Progressing toward goals    Frequency    Min 2X/week      PT Plan Current plan remains appropriate    Co-evaluation PT/OT/SLP Co-Evaluation/Treatment: Yes Reason for Co-Treatment: (P) Complexity of the patient's impairments (multi-system involvement);For patient/therapist safety PT goals addressed during session: Mobility/safety with mobility OT goals addressed during session: (P) ADL's and self-care     End of Session Equipment Utilized During Treatment: Gait belt Activity Tolerance: Patient  tolerated treatment well Patient left: in bed;with call bell/phone within reach;with bed alarm set;with restraints reapplied     Time: 0842-0908 PT Time Calculation (min) (ACUTE ONLY): 26 min  Charges:  $Therapeutic Activity: 8-22 mins                    G Codes:      Duncan Dull 10-18-2016, 9:31 AM Alben Deeds, PT DPT  [phone]

## 2016-09-26 NOTE — Progress Notes (Signed)
PROGRESS NOTE    [name]  [id number] DOB: 1927-01-14 DOA: 09/20/2016 PCP: Stephens Shire, MD   Brief Narrative:  83 y.o. 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 As per patient's wife patient was getting weak over the last 2 days and today while walking to the dining table patient's slumped to the. Did not hit his head or lose consciousness. In the ER patient was found to be hypotensive with labs showing markedly elevated LFTs and Lactate. Patient has known history of CBD stones with recurrent obstruction. Has had stents placed previously. CT abdomen and pelvis was done which shows dilated duct with infrarenal aortic aneurysm. Dr. Benson Norway, patient's gastroenterologist was consulted and since patient has difficult ERCP previously IR has been consulted for drain placement and has been placed by the time I examined the patient. On my exam patient appears confused and moves all extremities.   Subjective:  Patient in bed, denies any headache, no chest pain or abdominal pain, no shortness of breath.  Assessment & Plan:   Principal Problem:   Sepsis ([place]) Active Problems:   Coronary artery disease   DM2 (diabetes mellitus, type 2) (HCC)   Systolic CHF, chronic (HCC)   COPD with emphysema (HCC)   Pacemaker-[employer]   Acute cholangitis   Acute encephalopathy   CAD in native artery   AV block, Mobitz II   Uncontrolled type 2 diabetes mellitus with complication (HCC)   Dementia associated with other underlying disease with behavioral disturbance   Chronic diastolic CHF (congestive heart failure) (HCC)   Cholangitis   Bacteremia due to Klebsiella pneumoniae   Bacteremia due to Escherichia coli  Sepsis/Cholangitis with E.coli/Enterobacteriaceae/KLEBSIELLA PNEUMONIAE  bacteremia -S/P IR placement Biliary drain  - Previous M.D. had discussed the case with ID physician Dr. Michel Bickers and he was advised to continue antibiotics IV for a total of 10 days, he is currently on Rocephin and Flagyl with a start date of 09/21/2017, GI & IR following. - . Blood cultures drawn on 09/24/2016 currently negative.  Acute on Chronic Encephalopathy -Per wife patient A/O 1 most of the time - CT head was unremarkable -Close to baseline. Avoid benzodiazepines or narcotics as it may worsen delirium  Chronic Diastolic CHF last EF of 123456 -He currently appears, beta blocker dose reduced due to low blood pressure  CAD S/P stenting -Asymptomatic continue to monitor  2d degree AV block Mobitz type II S/P Pacemaker placement.  DM type II uncontrolled with complications -7/4 Hemoglobin A1c= 7.9 -Sensitive SSI  Hypotension. Reduce beta blocker dose and gently hydrate.   Goals of care -PT/OT: Recommend SNF -1/2 after long conversation with patient and wife and family concerning limited resources available to them at home they would like to speak with LCSW concerning CIR vs SNF placement. Have placed consult request.     DVT prophylaxis: SCD Code Status: DO NOT RESUSCITATE Family Communication: None Disposition Plan: SNF   Consultants:  Phone consult Dr. Michel Bickers ID   Procedures/Significant Events:  12/28 CT abdomen and pelvis W contrast:- Diffuse hepatic steatosis.  -S/Pcholecystectomy.  -Stable biliary ductal dilatation with common bile duct diameter 16 mm.  -Stable well-positioned biliary stent in the lower third of the common bile duct with expected pneumobilia. -Stable 3.6 cm infrarenal abdominal aortic  aneurysm.  12/28 CT head WO contrast:-Negative acute infarct -Generalized cerebral volume loss and moderate chronic small vessel ischemia.   VENTILATOR SETTINGS:    Cultures 1/28 urine pending 12/28 blood 2 positive Escherichia  coli/Enterobacteriaceae/KLEBSIELLA PNEUMONIAE 09/24/2016 blood 2 NGTD    Antimicrobials:  Anti-infectives    Start     Dose/Rate Route Frequency Ordered Stop   09/21/16 1200  levofloxacin (LEVAQUIN) IVPB 500 mg  Status:  Discontinued     500 mg 100 mL/hr over 60 Minutes Intravenous Every 24 hours 09/21/16 0010 09/21/16 1143   09/21/16 1145  cefTRIAXone (ROCEPHIN) 2 g in dextrose 5 % 50 mL IVPB     2 g 100 mL/hr over 30 Minutes Intravenous Every 24 hours 09/21/16 1143     09/21/16 0800  metroNIDAZOLE (FLAGYL) IVPB 500 mg     500 mg 100 mL/hr over 60 Minutes Intravenous Every 8 hours 09/21/16 0010     09/21/16 0000  levofloxacin (LEVAQUIN) IVPB 750 mg  Status:  Discontinued     750 mg 100 mL/hr over 90 Minutes Intravenous  Once 09/20/16 2345 09/21/16 0005   09/21/16 0000  metroNIDAZOLE (FLAGYL) IVPB 500 mg     500 mg 100 mL/hr over 60 Minutes Intravenous  Once 09/20/16 2345 09/21/16 0302   09/20/16 1700  metroNIDAZOLE (FLAGYL) IVPB 500 mg     500 mg 100 mL/hr over 60 Minutes Intravenous  Once 09/20/16 1648 09/20/16 1815   09/20/16 1630  cefTRIAXone (ROCEPHIN) 1 g in dextrose 5 % 50 mL IVPB     1 g 100 mL/hr over 30 Minutes Intravenous  Once 09/20/16 1619 09/20/16 1658   09/20/16 1630  azithromycin (ZITHROMAX) 500 mg in dextrose 5 % 250 mL IVPB     500 mg 250 mL/hr over 60 Minutes Intravenous  Once 09/20/16 1619 09/20/16 1727      Devices    LINES / TUBES:      Continuous Infusions: . sodium chloride       Objective: Vitals:   09/26/16 0600 09/26/16 0759 09/26/16 1000 09/26/16 1222  BP: 113/66 95/80 93/62  (!) 89/70  Pulse: 94 98 (!) 105 (!) 108  Resp: (!) 22 (!) 23 (!) 25 (!) 26  Temp:  98.7 F (37.1 C)  99.2 F (37.3 C)  TempSrc:  Axillary  Axillary  SpO2: 99% 97% 97% 96%  Weight:      Height:        Intake/Output Summary (Last 24 hours) at 09/26/16 1308 Last data filed at 09/26/16 1244  Gross per 24 hour  Intake           994.54 ml  Output              3000 ml  Net         -2005.46 ml   Filed Weights   09/24/16 0331 09/25/16 0400 09/26/16 0300  Weight: 80.5 kg (177 lb 7.5 oz) 79.2 kg (174 lb 9.7 oz) 77.7 kg (171 lb 4.8 oz)    Examination:  General:  Sleeping comfortably in chair, does not follow commands, No acute respiratory distress Eyes: negative scleral hemorrhage, negative anisocoria, negative icterus ENT: Negative Runny nose, negative gingival bleeding, Neck:  Negative scars, masses, torticollis, lymphadenopathy, JVD Lungs: Clear to auscultation bilaterally without wheezes or crackles Cardiovascular: Regular rate and rhythm without murmur gallop or rub normal S1 and S2 Abdomen: Obese, negative abdominal pain, nondistended, positive soft, bowel sounds, no rebound, no ascites, no appreciable mass, right biliary drain present (draining significant  amount of brownish discharge) Extremities: No significant cyanosis, clubbing, or edema bilateral lower extremities Skin: Negative rashes, lesions, ulcers Psychiatric:  Unable to fully evaluate secondary to dementia Central nervous system:  Unable to fully evaluate.  .     Data Reviewed: Care during the described time interval was provided by me .  I have reviewed this patient's available data, including medical history, events of note, physical examination, and all test results as part of my evaluation. I have personally reviewed and interpreted all radiology studies.  CBC:  Recent Labs Lab 09/20/16 1550 09/21/16 0432 09/22/16 0925 09/24/16 0548 09/25/16 0229 09/26/16 0212  WBC 16.0* 15.5* 8.1 10.0 14.7* 14.3*  NEUTROABS 14.8* 12.1*  --  7.5 10.8* 11.0*  HGB 14.4 12.3* 11.9* 13.9 15.2 15.4  HCT 42.1 36.5* 35.5* 41.9 45.3 46.8  MCV 93.3 93.8 92.7 95.2 94.0 96.5  PLT 228 249 225 319 342 0000000   Basic Metabolic Panel:  Recent Labs Lab 09/20/16 1550 09/21/16 0853 09/22/16 0925 09/24/16 0548 09/25/16 0229 09/26/16 0212  NA 135 140 139 144 143  --   K 4.0 3.9 3.3* 4.6  4.1  --   CL 100* 111 112* 111 110  --   CO2 22 20* 18* 20* 23  --   GLUCOSE 273* 131* 151* 145* 204*  --   BUN 17 14 12 14 16   --   CREATININE 1.49* 1.23 1.16 1.20 1.20  --   CALCIUM 8.8* 7.9* 8.4* 9.0 9.4  --   MG  --   --  1.7 2.2 2.2 2.3   GFR: Estimated Creatinine Clearance (by C-G formula based on SCr of 1.2 mg/dL) Male: 34.8 mL/min Male: 42.6 mL/min Liver Function Tests:  Recent Labs Lab 09/20/16 1550 09/21/16 0853 09/22/16 0925 09/25/16 0229  AST 964* 403* 153* 32  ALT 308* 232* 156* 59  ALKPHOS 314* 189* 176* 171*  BILITOT 3.5* 3.0* 1.1 0.5  PROT 5.8* 4.5* 5.1* 6.3*  ALBUMIN 2.8* 2.2* 2.3* 2.9*   No results for input(s): LIPASE, AMYLASE in the last 168 hours. No results for input(s): AMMONIA in the last 168 hours. Coagulation Profile:  Recent Labs Lab 09/20/16 2029 09/21/16 0109  INR 1.23 1.06   Cardiac Enzymes: No results for input(s): CKTOTAL, CKMB, CKMBINDEX, TROPONINI in the last 168 hours. BNP (last 3 results) No results for input(s): PROBNP in the last 8760 hours. HbA1C: No results for input(s): HGBA1C in the last 72 hours. CBG:  Recent Labs Lab 09/25/16 2026 09/25/16 2334 09/26/16 0359 09/26/16 0757 09/26/16 1220  GLUCAP 228* 229* 182* 180* 216*   Lipid Profile: No results for input(s): CHOL, HDL, LDLCALC, TRIG, CHOLHDL, LDLDIRECT in the last 72 hours. Thyroid Function Tests: No results for input(s): TSH, T4TOTAL, FREET4, T3FREE, THYROIDAB in the last 72 hours. Anemia Panel: No results for input(s): VITAMINB12, FOLATE, FERRITIN, TIBC, IRON, RETICCTPCT in the last 72 hours. Urine analysis:    Component Value Date/Time   COLORURINE AMBER (A) 09/20/2016 1546   APPEARANCEUR HAZY (A) 09/20/2016 1546   LABSPEC 1.014 09/20/2016 1546   PHURINE 7.0 09/20/2016 1546   GLUCOSEU 150 (A) 09/20/2016 1546   HGBUR NEGATIVE 09/20/2016 1546   BILIRUBINUR SMALL (A) 09/20/2016 1546   KETONESUR NEGATIVE 09/20/2016 1546   PROTEINUR 100 (A) 09/20/2016  1546   UROBILINOGEN 2.0 (H) 06/14/2015 0840   NITRITE NEGATIVE 09/20/2016 1546   LEUKOCYTESUR NEGATIVE 09/20/2016 1546   Sepsis Labs: @LABRCNTIP (procalcitonin:4,lacticidven:4)  ) Recent Results (from the past 240 hour(s))  Urine  culture     Status: Abnormal   Collection Time: 09/20/16  3:46 PM  Result Value Ref Range Status   Specimen Description URINE, CLEAN CATCH  Final   Special Requests NONE  Final   Culture MULTIPLE SPECIES PRESENT, SUGGEST RECOLLECTION (A)  Final   Report Status 09/21/2016 FINAL  Final  Blood Culture (routine x 2)     Status: Abnormal   Collection Time: 09/20/16  3:50 PM  Result Value Ref Range Status   Specimen Description BLOOD LEFT ANTECUBITAL  Final   Special Requests BOTTLES DRAWN AEROBIC AND ANAEROBIC 5CC  Final   Culture  Setup Time   Final    GRAM NEGATIVE RODS AEROBIC BOTTLE ONLY CRITICAL RESULT CALLED TO, READ BACK BY AND VERIFIED WITH: A JOHNSTON,PHARMD AT [id number] 09/21/16 BY L BENFIELD    Culture ESCHERICHIA COLI (A)  Final   Report Status 09/23/2016 FINAL  Final   Organism ID, Bacteria ESCHERICHIA COLI  Final      Susceptibility   Escherichia coli - MIC*    AMPICILLIN >=32 RESISTANT Resistant     CEFAZOLIN >=64 RESISTANT Resistant     CEFEPIME <=1 SENSITIVE Sensitive     CEFTAZIDIME <=1 SENSITIVE Sensitive     CEFTRIAXONE <=1 SENSITIVE Sensitive     CIPROFLOXACIN >=4 RESISTANT Resistant     GENTAMICIN >=16 RESISTANT Resistant     IMIPENEM <=0.25 SENSITIVE Sensitive     TRIMETH/SULFA >=320 RESISTANT Resistant     AMPICILLIN/SULBACTAM >=32 RESISTANT Resistant     PIP/TAZO >=128 RESISTANT Resistant     Extended ESBL NEGATIVE Sensitive     * ESCHERICHIA COLI  Blood Culture ID Panel (Reflexed)     Status: Abnormal   Collection Time: 09/20/16  3:50 PM  Result Value Ref Range Status   Enterococcus species NOT DETECTED NOT DETECTED Final   Listeria monocytogenes NOT DETECTED NOT DETECTED Final   Staphylococcus species NOT DETECTED NOT  DETECTED Final   Staphylococcus aureus NOT DETECTED NOT DETECTED Final   Streptococcus species NOT DETECTED NOT DETECTED Final   Streptococcus agalactiae NOT DETECTED NOT DETECTED Final   Streptococcus pneumoniae NOT DETECTED NOT DETECTED Final   Streptococcus pyogenes NOT DETECTED NOT DETECTED Final   Acinetobacter baumannii NOT DETECTED NOT DETECTED Final   Enterobacteriaceae species DETECTED (A) NOT DETECTED Final    Comment: CRITICAL RESULT CALLED TO, READ BACK BY AND VERIFIED WITH: A JOHNSTON,PHARMD AT 0841 09/21/16 BY L BENFIELD    Enterobacter cloacae complex NOT DETECTED NOT DETECTED Final   Escherichia coli DETECTED (A) NOT DETECTED Final    Comment: CRITICAL RESULT CALLED TO, READ BACK BY AND VERIFIED WITH: A JOHNSTON,PHARMD AT [id number] 09/21/16 BY L BENFIELD    Klebsiella oxytoca NOT DETECTED NOT DETECTED Final   Klebsiella pneumoniae NOT DETECTED NOT DETECTED Final   Proteus species NOT DETECTED NOT DETECTED Final   Serratia marcescens NOT DETECTED NOT DETECTED Final   Carbapenem resistance NOT DETECTED NOT DETECTED Final   Haemophilus influenzae NOT DETECTED NOT DETECTED Final   Neisseria meningitidis NOT DETECTED NOT DETECTED Final   Pseudomonas aeruginosa NOT DETECTED NOT DETECTED Final   Candida albicans NOT DETECTED NOT DETECTED Final   Candida glabrata NOT DETECTED NOT DETECTED Final   Candida krusei NOT DETECTED NOT DETECTED Final   Candida parapsilosis NOT DETECTED NOT DETECTED Final   Candida tropicalis NOT DETECTED NOT DETECTED Final  Blood Culture (routine x 2)     Status: Abnormal   Collection Time: 09/20/16  3:55 PM  Result  Value Ref Range Status   Specimen Description BLOOD RIGHT ANTECUBITAL  Final   Special Requests BOTTLES DRAWN AEROBIC AND ANAEROBIC 5CC  Final   Culture  Setup Time   Final    GRAM NEGATIVE RODS AEROBIC BOTTLE ONLY CRITICAL VALUE NOTED.  VALUE IS CONSISTENT WITH PREVIOUSLY REPORTED AND CALLED VALUE.    Culture (A)  Final    KLEBSIELLA  PNEUMONIAE CRITICAL RESULT CALLED TO, READ BACK BY AND VERIFIED WITH: J. Advance, [place] ON [id number] BY Rhea Bleacher    Report Status 09/26/2016 FINAL  Final   Organism ID, Bacteria KLEBSIELLA PNEUMONIAE  Final      Susceptibility   Klebsiella pneumoniae - MIC*    AMPICILLIN >=32 RESISTANT Resistant     CEFAZOLIN <=4 SENSITIVE Sensitive     CEFEPIME <=1 SENSITIVE Sensitive     CEFTAZIDIME <=1 SENSITIVE Sensitive     CEFTRIAXONE <=1 SENSITIVE Sensitive     CIPROFLOXACIN <=0.25 SENSITIVE Sensitive     GENTAMICIN <=1 SENSITIVE Sensitive     IMIPENEM <=0.25 SENSITIVE Sensitive     TRIMETH/SULFA <=20 SENSITIVE Sensitive     AMPICILLIN/SULBACTAM 4 SENSITIVE Sensitive     PIP/TAZO <=4 SENSITIVE Sensitive     Extended ESBL NEGATIVE Sensitive     * KLEBSIELLA PNEUMONIAE  MRSA PCR Screening     Status: None   Collection Time: 09/21/16 12:27 AM  Result Value Ref Range Status   MRSA by PCR NEGATIVE NEGATIVE Final    Comment:        The GeneXpert MRSA Assay (FDA approved for NASAL specimens only), is one component of a comprehensive MRSA colonization surveillance program. It is not intended to diagnose MRSA infection nor to guide or monitor treatment for MRSA infections.   Culture, blood (routine x 2)     Status: None (Preliminary result)   Collection Time: 09/24/16  5:48 AM  Result Value Ref Range Status   Specimen Description BLOOD RIGHT ANTECUBITAL  Final   Special Requests IN PEDIATRIC BOTTLE 2CC  Final   Culture NO GROWTH 1 DAY  Final   Report Status PENDING  Incomplete  Culture, blood (routine x 2)     Status: None (Preliminary result)   Collection Time: 09/24/16  5:48 AM  Result Value Ref Range Status   Specimen Description BLOOD BLOOD RIGHT HAND  Final   Special Requests IN PEDIATRIC BOTTLE 2CC  Final   Culture NO GROWTH 1 DAY  Final   Report Status PENDING  Incomplete         Radiology Studies: No results found.      Scheduled Meds: . cefTRIAXone  (ROCEPHIN)  IV  2 g Intravenous Q24H  . fluticasone furoate-vilanterol  1 puff Inhalation Daily  . insulin aspart  0-9 Units Subcutaneous Q4H  . [START ON 09/27/2016] metoprolol tartrate  12.5 mg Oral BID  . metronidazole  500 mg Intravenous Q8H   Continuous Infusions: . sodium chloride       LOS: 6 days    Time spent: 40 minutes    Thurnell Lose, MD [facility] Pager [phone]  If 7PM-7AM, please contact night-coverage [email or web address] Password [email or web address] 09/26/2016, 1:08 PM

## 2016-09-26 NOTE — Progress Notes (Signed)
Subjective: No acute events.  Objective: Vital signs in last 24 hours: Temp:  [97.9 F (36.6 C)-99.4 F (37.4 C)] 99.1 F (37.3 C) (01/03 1640) Pulse Rate:  [74-112] 103 (01/03 1640) Resp:  [16-27] 27 (01/03 1640) BP: (69-121)/(54-80) 99/74 (01/03 1640) SpO2:  [64 %-100 %] 99 % (01/03 1640) Weight:  [77.7 kg (171 lb 4.8 oz)] 77.7 kg (171 lb 4.8 oz) (01/03 0300) Last BM Date: 09/22/16  Intake/Output from previous day: 01/02 0701 - 01/03 0700 In: 1034.5 [P.O.:240; IV Piggyback:794.5] Out: 3450 [Urine:250; Drains:3200] Intake/Output this shift: Total I/O In: 450 [I.V.:150; IV Piggyback:300] Out: 1250 [Drains:1250]  General appearance: alert and no distress GI: soft, non-tender; bowel sounds normal; no masses,  no organomegaly  Lab Results:  Recent Labs  09/24/16 0548 09/25/16 0229 09/26/16 0212  WBC 10.0 14.7* 14.3*  HGB 13.9 15.2 15.4  HCT 41.9 45.3 46.8  PLT 319 342 249   BMET  Recent Labs  09/24/16 0548 09/25/16 0229 09/26/16 1224  NA 144 143 146*  K 4.6 4.1 4.3  CL 111 110 111  CO2 20* 23 21*  GLUCOSE 145* 204* 244*  BUN 14 16 36*  CREATININE 1.20 1.20 3.49*  CALCIUM 9.0 9.4 9.4   LFT  Recent Labs  09/26/16 1224  PROT 6.0*  ALBUMIN 2.9*  AST 39  ALT 36  ALKPHOS 145*  BILITOT 0.8   PT/INR No results for input(s): LABPROT, INR in the last 72 hours. Hepatitis Panel No results for input(s): HEPBSAG, HCVAB, HEPAIGM, HEPBIGM in the last 72 hours. C-Diff No results for input(s): CDIFFTOX in the last 72 hours. Fecal Lactopherrin No results for input(s): FECLLACTOFRN in the last 72 hours.  Studies/Results: No results found.  Medications:  Scheduled: . cefTRIAXone (ROCEPHIN)  IV  2 g Intravenous Q24H  . fluticasone furoate-vilanterol  1 puff Inhalation Daily  . insulin aspart  0-9 Units Subcutaneous Q4H  . [START ON 09/27/2016] metoprolol tartrate  12.5 mg Oral BID  . metronidazole  500 mg Intravenous Q8H   Continuous: . sodium chloride 75  mL/hr at 09/26/16 1300    Assessment/Plan: 1) Obstructed biliary stent. 2) Cholangitis. 3) S/p IR biliary stent.   I discussed the plan to repeat the ERCP tomorrow in an attempt to replace a new metallic biliary stent with his wife.  Plan: 1) ERCP tomorrow with replacement of the stent.  LOS: 6 days   Evon Lopezperez D 09/26/2016, 6:37 PM

## 2016-09-26 NOTE — Progress Notes (Signed)
Pt with hypotension in supine and with bed in chair position. Lethargic, requiring stimulation to remain awake. Focus of session on self feeding with pt progressing from max to mod assist during session. SNF continues to be the optimal d/c plan.   09/26/16 0900  OT Visit Information  Last OT Received On 09/26/16  Assistance Needed +2  PT/OT/SLP Co-Evaluation/Treatment Yes  Reason for Co-Treatment Complexity of the patient's impairments (multi-system involvement);For patient/therapist safety  OT goals addressed during session ADL's and self-care  History of Present Illness 83 y.o. male with history of CAD status post stenting, second degree AV block status post pacemaker placement, diabetes mellitus type 2, CHF, COPD, dementia was brought to the ER after patient was found to be weak and confused.   Precautions  Precautions Fall  Precaution Comments biliary drain Rt flank  Pain Assessment  Pain Assessment Faces  Faces Pain Scale 0  Cognition  Arousal/Alertness Lethargic (requires stimulation to remain aroused)  Behavior During Therapy Flat affect  Overall Cognitive Status No family/caregiver present to determine baseline cognitive functioning  General Comments Known hx of dementia but anticipate pt currently not at his baseline cognitively. Pt intermittently following one step commands and requires consistent cues for initiation and sequencing of activities.  ADL  Overall ADL's  Needs assistance/impaired  Eating/Feeding Maximal assistance;Bed level;Moderate assistance  Eating/Feeding Details (indicate cue type and reason) with bed in chair position  Grooming Wash/dry face;Bed level;Moderate assistance  Grooming Details (indicate cue type and reason) to wipe mouth  General ADL Comments pt following commands with multimodal cues, inconsistently  Bed Mobility  General bed mobility comments total assist of 2 to pull up in bed  OT - End of Session  Equipment Utilized During Treatment Oxygen   Activity Tolerance Patient limited by lethargy;Treatment limited secondary to medical complications (Comment) (hypotensive)  Patient left in bed;with call bell/phone within reach;with restraints reapplied;with bed alarm set  OT Assessment/Plan  OT Plan Discharge plan remains appropriate  OT Frequency (ACUTE ONLY) Min 2X/week  Follow Up Recommendations SNF;Supervision/Assistance - 24 hour  OT Goal Progression  Progress towards OT goals Not progressing toward goals - comment  Acute Rehab OT Goals  Patient Stated Goal Unable to state  Time For Goal Achievement 10/07/16  Potential to Achieve Goals Fair  OT Time Calculation  OT Start Time (ACUTE ONLY) 0839  OT Stop Time (ACUTE ONLY) 0926  OT Time Calculation (min) 47 min  OT General Charges  $OT Visit 1 Procedure  OT Treatments  $Self Care/Home Management  23-37 mins  09/26/2016 Nestor Lewandowsky, OTR/L Pager: [phone]

## 2016-09-26 NOTE — Progress Notes (Signed)
Patient assisted up to [facility].  While sitting became lethargic, responsive to pain.  Immediately assisted back to bed with 2 assist.  Patient's SBP 70's.  Patient given 250 NSS bolus, and notified doctor.  Patient's BP returned to 106/71.  Orders for 500cc bolus received and to be started now as ordered.  Monitoring.

## 2016-09-26 NOTE — Progress Notes (Signed)
I spoke with the patient's wife.  Since he is still here in the hospital I will pursue a stent exchange tomorrow.  Subsequently IR can remove his int/ext stent.

## 2016-09-26 NOTE — NC FL2 (Signed)
[place] LEVEL OF CARE SCREENING TOOL     IDENTIFICATION  Patient Name: [name] Birthdate: 1927-07-10 Sex: male Admission Date (Current Location): 09/20/2016  [facility] and [place] Number:  Herbalist and Address:  [facility], [place] [address], [place], [place] [address]      Provider Number: [id number]  Attending Physician Name and Address:  Thurnell Lose, MD  Relative Name and Phone Number:  [name], spouse, [phone]    Current Level of Care: Hospital Recommended Level of Care: [place] Prior Approval Number:    Date Approved/Denied:   PASRR Number: [id number]  Discharge Plan: SNF    Current Diagnoses: Patient Active Problem List   Diagnosis Date Noted  . Bacteremia due to Klebsiella pneumoniae   . Bacteremia due to Escherichia coli   . Dementia associated with other underlying disease with behavioral disturbance   . Chronic diastolic CHF (congestive heart failure) ([place])   . Cholangitis   . CAD in native artery   . AV block, Mobitz II   . Uncontrolled type 2 diabetes mellitus with complication ([place])   . Acute encephalopathy 03/27/2016  . Dehydration 03/27/2016  . SIRS (systemic inflammatory response syndrome) ([place]) 03/26/2016  . Acute cholangitis 05/30/2014  . Nonspecific abnormal results of liver function study 05/30/2014  . Nonspecific (abnormal) findings on radiological and other examination of biliary tract 05/30/2014  . Sepsis ([place]) 05/28/2014  . Other persistent mental disorders due to conditions classified elsewhere 02/03/2013  . Memory loss 02/03/2013  . Dementia with behavioral disturbance 11/01/2012  . Chronic renal insufficiency, stage II (mild) 11/01/2012  . Pacemaker-St.Jude 07/08/2012  . COPD with emphysema ([place]) 06/24/2012  . Leukocytosis 06/23/2012  . Systolic CHF, chronic ([place]) 07/11/2011  . 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 Second degree AV block, Mobitz  type II   . Chest discomfort   . Coronary artery disease   . DM2 (diabetes mellitus, type 2) ([place])   . Hypertension   . Hyperlipidemia   . Obesities, morbid ([place])   . Gallstone pancreatitis   . TIA (transient ischemic attack)   . Bacteremia due to vancomycin resistant Enterococcus   . GERD (gastroesophageal reflux disease)   . Hearing impairment     Orientation RESPIRATION BLADDER Height & Weight     Self  O2 (2L [place]) Incontinent, External catheter Weight: 171 lb 4.8 oz (77.7 kg) Height:  5\' 6"  (167.6 cm)  BEHAVIORAL SYMPTOMS/MOOD NEUROLOGICAL BOWEL NUTRITION STATUS      Incontinent (Biliary tube) Diet (see DC summary)  AMBULATORY STATUS COMMUNICATION OF NEEDS Skin   Extensive Assist Verbally Normal                       Personal Care Assistance Level of Assistance  Bathing, Feeding, Dressing Bathing Assistance: Maximum assistance Feeding assistance: Maximum assistance Dressing Assistance: Maximum assistance     Functional Limitations Info             SPECIAL CARE FACTORS FREQUENCY  PT (By licensed PT)     PT Frequency: 5x/week              Contractures      Additional Factors Info  Code Status, Allergies, Isolation Precautions, Insulin Sliding Scale Code Status Info: DNR Allergies Info: Doxycycline, Penicillins   Insulin Sliding Scale Info: Every four hours Isolation Precautions Info: OMDRO     Current Medications (09/26/2016):  This is the current hospital active  medication list Current Facility-Administered Medications  Medication Dose Route Frequency Provider Last Rate Last Dose  . 0.9 %  sodium chloride infusion   Intravenous Continuous Thurnell Lose, MD      . cefTRIAXone (ROCEPHIN) 2 g in dextrose 5 % 50 mL IVPB  2 g Intravenous Q24H Allie Bossier, MD   2 g at 09/26/16 1121  . fluticasone furoate-vilanterol (BREO ELLIPTA) 100-25 MCG/INH 1 puff  1 puff Inhalation Daily Rise Patience, MD   1 puff at 09/23/16 1240  . insulin aspart  (novoLOG) injection 0-9 Units  0-9 Units Subcutaneous Q4H Rise Patience, MD   3 Units at 09/26/16 1242  . LORazepam (ATIVAN) injection 0.5-1 mg  0.5-1 mg Intravenous Q6H PRN Allie Bossier, MD      . Derrill Memo ON 09/27/2016] metoprolol tartrate (LOPRESSOR) 25 mg/10 mL oral suspension 12.5 mg  12.5 mg Oral BID Thurnell Lose, MD      . metroNIDAZOLE (FLAGYL) IVPB 500 mg  500 mg Intravenous Q8H Veronda P Bryk, RPH   500 mg at 09/26/16 [id number]  . nitroGLYCERIN (NITROSTAT) SL tablet 0.4 mg  0.4 mg Sublingual Q5 Min x 3 PRN Rise Patience, MD      . ondansetron [facility]) tablet 4 mg  4 mg Oral Q6H PRN Rise Patience, MD       Or  . ondansetron [facility]) injection 4 mg  4 mg Intravenous Q6H PRN Rise Patience, MD      . RESOURCE THICKENUP CLEAR   Oral PRN Allie Bossier, MD         Discharge Medications: Please see discharge summary for a list of discharge medications.  Relevant Imaging Results:  Relevant Lab Results:   Additional Information SSN: [id number], [place] H, LCSW

## 2016-09-27 ENCOUNTER — Encounter (HOSPITAL_COMMUNITY): Payer: Self-pay | Admitting: Anesthesiology

## 2016-09-27 ENCOUNTER — Encounter (HOSPITAL_COMMUNITY): Payer: Self-pay | Admitting: *Deleted

## 2016-09-27 ENCOUNTER — Encounter (HOSPITAL_COMMUNITY)
Admission: EM | Disposition: A | Discharge status: Hospice | Payer: Self-pay | Source: Home / Self Care | Attending: Internal Medicine

## 2016-09-27 LAB — MAGNESIUM: Magnesium: 2.7 mg/dL — ABNORMAL HIGH (ref 1.7–2.4)

## 2016-09-27 LAB — CBC WITH DIFFERENTIAL/PLATELET
Basophils Absolute: 0 10*3/uL (ref 0.0–0.1)
Basophils Relative: 0 %
EOS PCT: 0 %
Eosinophils Absolute: 0 10*3/uL (ref 0.0–0.7)
HEMATOCRIT: 45 % (ref 39.0–52.0)
Hemoglobin: 15 g/dL (ref 13.0–17.0)
LYMPHS ABS: 3.8 10*3/uL (ref 0.7–4.0)
Lymphocytes Relative: 22 %
MCH: 31.7 pg (ref 26.0–34.0)
MCHC: 33.3 g/dL (ref 30.0–36.0)
MCV: 95.1 fL (ref 78.0–100.0)
MONO ABS: 0.9 10*3/uL (ref 0.1–1.0)
Monocytes Relative: 5 %
Neutro Abs: 12.7 10*3/uL — ABNORMAL HIGH (ref 1.7–7.7)
Neutrophils Relative %: 73 %
Platelets: 305 10*3/uL (ref 150–400)
RBC: 4.73 MIL/uL (ref 4.22–5.81)
RDW: 14.3 % (ref 11.5–15.5)
WBC: 17.4 10*3/uL — AB (ref 4.0–10.5)

## 2016-09-27 LAB — COMPREHENSIVE METABOLIC PANEL
ALK PHOS: 135 U/L — AB (ref 38–126)
ALT: 28 U/L (ref 17–63)
ANION GAP: 13 (ref 5–15)
AST: 61 U/L — ABNORMAL HIGH (ref 15–41)
Albumin: 2.8 g/dL — ABNORMAL LOW (ref 3.5–5.0)
BILIRUBIN TOTAL: 2 mg/dL — AB (ref 0.3–1.2)
BUN: 44 mg/dL — ABNORMAL HIGH (ref 6–20)
CO2: 21 mmol/L — ABNORMAL LOW (ref 22–32)
Calcium: 9.2 mg/dL (ref 8.9–10.3)
Chloride: 113 mmol/L — ABNORMAL HIGH (ref 101–111)
Creatinine, Ser: 4.7 mg/dL — ABNORMAL HIGH (ref 0.61–1.24)
GFR, EST AFRICAN AMERICAN: 12 mL/min — AB (ref >60)
GFR, EST NON AFRICAN AMERICAN: 10 mL/min — AB (ref >60)
Glucose, Bld: 199 mg/dL — ABNORMAL HIGH (ref 65–99)
Potassium: 4.7 mmol/L (ref 3.5–5.1)
Sodium: 147 mmol/L — ABNORMAL HIGH (ref 135–145)
TOTAL PROTEIN: 5.8 g/dL — AB (ref 6.5–8.1)

## 2016-09-27 LAB — GLUCOSE, CAPILLARY
GLUCOSE-CAPILLARY: 182 mg/dL — AB (ref 65–99)
Glucose-Capillary: 181 mg/dL — ABNORMAL HIGH (ref 65–99)
Glucose-Capillary: 189 mg/dL — ABNORMAL HIGH (ref 65–99)
Glucose-Capillary: 186 mg/dL — ABNORMAL HIGH (ref 65–99)
Glucose-Capillary: 168 mg/dL — ABNORMAL HIGH (ref 65–99)

## 2016-09-27 SURGERY — CANCELLED PROCEDURE

## 2016-09-27 MED ORDER — ALBUMIN HUMAN 25 % IV SOLN
50.0000 g | Freq: Once | INTRAVENOUS | Status: AC
  Administered 2016-09-27: 50 g via INTRAVENOUS
  Filled 2016-09-27: qty 200

## 2016-09-27 MED ORDER — DEXTROSE-NACL 5-0.45 % IV SOLN
INTRAVENOUS | Status: DC
  Administered 2016-09-27 – 2016-09-29 (×6): via INTRAVENOUS

## 2016-09-27 MED ORDER — INSULIN ASPART 100 UNIT/ML ~~LOC~~ SOLN
0.0000 [IU] | SUBCUTANEOUS | Status: DC
  Administered 2016-09-27 – 2016-09-28 (×5): 3 [IU] via SUBCUTANEOUS
  Administered 2016-09-28: 2 [IU] via SUBCUTANEOUS
  Administered 2016-09-28: 3 [IU] via SUBCUTANEOUS
  Administered 2016-09-28: 5 [IU] via SUBCUTANEOUS
  Administered 2016-09-28: 3 [IU] via SUBCUTANEOUS
  Administered 2016-09-29: 5 [IU] via SUBCUTANEOUS
  Administered 2016-09-29: 3 [IU] via SUBCUTANEOUS
  Administered 2016-09-29: 5 [IU] via SUBCUTANEOUS
  Administered 2016-09-29 (×2): 3 [IU] via SUBCUTANEOUS
  Administered 2016-09-30: 5 [IU] via SUBCUTANEOUS
  Administered 2016-09-30 (×2): 3 [IU] via SUBCUTANEOUS

## 2016-09-27 MED ORDER — SODIUM CHLORIDE 0.9 % IV SOLN: INTRAVENOUS | Status: DC

## 2016-09-27 NOTE — Progress Notes (Signed)
Inpatient Diabetes Program Recommendations  AACE/ADA: New Consensus Statement on Inpatient Glycemic Control (2015)  Target Ranges:  Prepandial:   less than 140 mg/dL      Peak postprandial:   less than 180 mg/dL (1-2 hours)      Critically ill patients:  140 - 180 mg/dL   Lab Results  Component Value Date   GLUCAP 181 (H) 09/27/2016   HGBA1C 7.9 (H) 03/27/2016    Review of Glycemic Control:  Results for PATON, SHAMPINE (MRN [id number]) as of 09/27/2016 10:20  Ref. Range 09/26/2016 16:38 09/26/2016 19:56 09/26/2016 23:35 09/27/2016 03:51 09/27/2016 08:02  Glucose-Capillary Latest Ref Range: 65 - 99 mg/dL 215 (H) 222 (H) 169 (H) 182 (H) 181 (H)   Inpatient Diabetes Program Recommendations:    CBG's slightly greater than goal.  If appropriate,consider adding Levemir 8 units daily.  Thanks, Adah Perl, RN, BC-ADM Inpatient Diabetes Coordinator Pager [phone] (8a-5p)

## 2016-09-27 NOTE — Progress Notes (Addendum)
PROGRESS NOTE    [name]  YHC:[id number] DOB: [birth year] DOA: 09/20/2016 PCP: Stephens Shire, MD   Brief Narrative:  83 y.o. 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 As per patient's wife patient was getting weak over the last 2 days and today while walking to the dining table patient's slumped to the. Did not hit his head or lose consciousness. In the ER patient was found to be hypotensive with labs showing markedly elevated LFTs and Lactate. Patient has known history of CBD stones with recurrent obstruction. Has had stents placed previously. CT abdomen and pelvis was done which shows dilated duct with infrarenal aortic aneurysm. Dr. Benson Norway, patient's gastroenterologist was consulted and since patient has difficult ERCP previously IR has been consulted for drain placement and has been placed by the time I examined the patient. On my exam patient appears confused and moves all extremities.   Subjective: 1/4 eyes open but does not interact with family or staff. Does not obey commands.     Assessment & Plan:   Principal Problem:   Sepsis ([place]) Active Problems:   Coronary artery disease   DM2 (diabetes mellitus, type 2) (HCC)   Systolic CHF, chronic (HCC)   COPD with emphysema (HCC)   Pacemaker-[employer]   Acute cholangitis   Acute encephalopathy   CAD in native artery   AV block, Mobitz II   Uncontrolled type 2 diabetes mellitus with complication ([place])   Dementia associated with other underlying disease with behavioral disturbance   Chronic diastolic CHF (congestive heart failure) (HCC)   Cholangitis   Bacteremia due to Klebsiella pneumoniae   Bacteremia due to Escherichia coli  Sepsis/Cholangitis -S/P IR placement Biliary drain  -Spoke with Dr. Michel Bickers ID, who  recommends continuing current antibiotics for 10 days and patient doing well discontinuing. At that time IR would be able to replace stent  -Dr. Carol Ada GI was going to replace stent on 1/4 however when patient was taken to OR , anesthesiology and Dr. Benson Norway felt patient too unstable to survive procedure and procedure was aborted. Myself and Dr. Benson Norway met with Ms. Sigal and explained that we felt she would not survive this hospitalization. We agreed that we would give patient 3 more days of treatment and if he did not improve to the point where he could undertake definitive treatment would proceed with hospice. -Ms. Berline Lopes requested to speak with palliative care in order to familiarize herself with her options. Consult placed  positive E.coli/Enterobacteriaceae/KLEBSIELLA PNEUMONIAE bacteremia -12/28 blood culture. Continue current antibiotic awaiting susceptibilities  -Repeat blood cultures on 09/24/2016  Acute on Chronic Encephalopathy -Per wife patient A/O 1 most of the time - CT head was unremarkable -Ativan PRN for behavioral disturbances: Use sitter as much as possible and limit sedating medication   Chronic Diastolic CHF -Strict I&O since admission - 6.9 L -Daily weight Filed Weights   09/25/16 0400 09/26/16 0300 09/27/16 0354  Weight: 79.2 kg (174 lb 9.7 oz) 77.7 kg (171 lb 4.8 oz) 76.3 kg (168 lb 3.4 oz)  -Increase Metoprolol 25 mg BID (home dose 50 mg ).  CAD S/P stenting -Asymptomatic continue to monitor  2d degree AV block Mobitz type II S/P Pacemaker placement.  Hypotension -D5-1/2NS at 153m/hr -Albumin 50 g 1  DM  type II uncontrolled with complications -7/4 Hemoglobin A1c= 7.9 -Increase to moderate SSI  Acute on chronic renal failure -Most likely multifactorial, patient appears to be actively dying, dehydration (iatrogenic) Lab Results  Component Value Date   CREATININE 4.70 (H) 09/27/2016   CREATININE 3.49 (H) 09/26/2016   CREATININE 1.20 09/25/2016      Hypokalemia -Potassium goal>  Hypomagnesemia -Magnesium goal> 2     Goals of care -PT/OT: Recommend SNF -1/4 myself and Dr. Benson Norway GI had long conversation with wife and explained to her we did not believe patient was survive this hospitalization. Mrs. Helvey requested to speak with palliative care to discuss her options. She also requested additional 3 days of treatment before making final decision.     DVT prophylaxis: SCD Code Status: DO NOT RESUSCITATE Family Communication: None Disposition Plan: SNF   Consultants:  Phone consult Dr. Michel Bickers ID   Procedures/Significant Events:  12/28 CT abdomen and pelvis W contrast:- Diffuse hepatic steatosis.  -S/Pcholecystectomy.  -Stable biliary ductal dilatation with common bile duct diameter 16 mm.  -Stable well-positioned biliary stent in the lower third of the common bile duct with expected pneumobilia. -Stable 3.6 cm infrarenal abdominal aortic aneurysm.  12/28 CT head WO contrast:-Negative acute infarct -Generalized cerebral volume loss and moderate chronic small vessel ischemia.   VENTILATOR SETTINGS:    Cultures 1/28 urine pending 12/28 blood 2 positive Escherichia coli/Enterobacteriaceae/KLEBSIELLA PNEUMONIAE 09/24/2016 blood 2 NGTD    Antimicrobials: Anti-infectives    Start     Stop   09/21/16 1200  levofloxacin (LEVAQUIN) IVPB 500 mg  Status:  Discontinued     09/21/16 1143   09/21/16 1145  cefTRIAXone (ROCEPHIN) 2 g in dextrose 5 % 50 mL IVPB         09/21/16 0800  metroNIDAZOLE (FLAGYL) IVPB 500 mg         09/21/16 0000  levofloxacin (LEVAQUIN) IVPB 750 mg  Status:  Discontinued     09/21/16 0005   09/21/16 0000  metroNIDAZOLE (FLAGYL) IVPB 500 mg     09/21/16 0302   09/20/16 1700  metroNIDAZOLE (FLAGYL) IVPB 500 mg     09/20/16 1815   09/20/16 1630  cefTRIAXone (ROCEPHIN) 1 g in dextrose 5 % 50 mL IVPB     09/20/16 1658   09/20/16 1630  azithromycin (ZITHROMAX) 500 mg in dextrose 5 % 250 mL  IVPB     09/20/16 1727       Devices    LINES / TUBES:      Continuous Infusions:    Objective: Vitals:   09/27/16 1330 09/27/16 1335 09/27/16 1340 09/27/16 1345  BP: (!) 86/54 (!) 91/58 (!) 91/58 (!) 87/56  Pulse: (!) 102 (!) 104 (!) 103 (!) 103  Resp: (!) 27 (!) 28 (!) 39 (!) 30  Temp:      TempSrc:      SpO2: 100% 100% 99% 100%  Weight:      Height:        Intake/Output Summary (Last 24 hours) at 09/27/16 1438 Last data filed at 09/27/16 1227  Gross per 24 hour  Intake          2118.75 ml  Output             3225 ml  Net         -1106.25 ml   Filed Weights   09/25/16 0400 09/26/16 0300 09/27/16 0354  Weight: 79.2 kg (174 lb 9.7 oz) 77.7 kg (171 lb 4.8 oz) 76.3 kg (  168 lb 3.4 oz)    Examination:  General: Eyes open, does not interact with wife or medical staff, does not follow commands, No acute respiratory distress Eyes: negative scleral hemorrhage, negative anisocoria, negative icterus ENT: Negative Runny nose, negative gingival bleeding, Neck:  Negative scars, masses, torticollis, lymphadenopathy, JVD Lungs: Clear to auscultation bilaterally without wheezes or crackles Cardiovascular: Tachycardic, Regular rhythm without murmur gallop or rub normal S1 and S2 Abdomen: Obese, negative abdominal pain, nondistended, positive soft, bowel sounds, no rebound, no ascites, no appreciable mass, right biliary drain present (draining significant amount of brownish discharge) Extremities: No significant cyanosis, clubbing, or edema bilateral lower extremities Skin: Negative rashes, lesions, ulcers Psychiatric:  Unable to fully evaluate secondary to dementia Central nervous system:  Unable to fully evaluate.  .     Data Reviewed: Care during the described time interval was provided by me .  I have reviewed this patient's available data, including medical history, events of note, physical examination, and all test results as part of my evaluation. I have personally  reviewed and interpreted all radiology studies.  CBC:  Recent Labs Lab 09/21/16 0432 09/22/16 0925 09/24/16 0548 09/25/16 0229 09/26/16 0212 09/27/16 0317  WBC 15.5* 8.1 10.0 14.7* 14.3* 17.4*  NEUTROABS 12.1*  --  7.5 10.8* 11.0* 12.7*  HGB 12.3* 11.9* 13.9 15.2 15.4 15.0  HCT 36.5* 35.5* 41.9 45.3 46.8 45.0  MCV 93.8 92.7 95.2 94.0 96.5 95.1  PLT 249 225 319 342 249 419   Basic Metabolic Panel:  Recent Labs Lab 09/22/16 0925 09/24/16 0548 09/25/16 0229 09/26/16 0212 09/26/16 1224 09/27/16 0317  NA 139 144 143  --  146* 147*  K 3.3* 4.6 4.1  --  4.3 4.7  CL 112* 111 110  --  111 113*  CO2 18* 20* 23  --  21* 21*  GLUCOSE 151* 145* 204*  --  244* 199*  BUN '12 14 16  '$ --  36* 44*  CREATININE 1.16 1.20 1.20  --  3.49* 4.70*  CALCIUM 8.4* 9.0 9.4  --  9.4 9.2  MG 1.7 2.2 2.2 2.3  --  2.7*   GFR: Estimated Creatinine Clearance (by C-G formula based on SCr of 4.7 mg/dL (H)) Male: 8.8 mL/min Male: 10 mL/min Liver Function Tests:  Recent Labs Lab 09/21/16 0853 09/22/16 0925 09/25/16 0229 09/26/16 1224 09/27/16 0317  AST 403* 153* 32 39 61*  ALT 232* 156* 59 36 28  ALKPHOS 189* 176* 171* 145* 135*  BILITOT 3.0* 1.1 0.5 0.8 2.0*  PROT 4.5* 5.1* 6.3* 6.0* 5.8*  ALBUMIN 2.2* 2.3* 2.9* 2.9* 2.8*   No results for input(s): LIPASE, AMYLASE in the last 168 hours. No results for input(s): AMMONIA in the last 168 hours. Coagulation Profile:  Recent Labs Lab 09/20/16 2029 09/21/16 0109  INR 1.23 1.06   Cardiac Enzymes: No results for input(s): CKTOTAL, CKMB, CKMBINDEX, TROPONINI in the last 168 hours. BNP (last 3 results) No results for input(s): PROBNP in the last 8760 hours. HbA1C: No results for input(s): HGBA1C in the last 72 hours. CBG:  Recent Labs Lab 09/26/16 1956 09/26/16 2335 09/27/16 0351 09/27/16 0802 09/27/16 1229  GLUCAP 222* 169* 182* 181* 189*   Lipid Profile: No results for input(s): CHOL, HDL, LDLCALC, TRIG, CHOLHDL, LDLDIRECT in  the last 72 hours. Thyroid Function Tests: No results for input(s): TSH, T4TOTAL, FREET4, T3FREE, THYROIDAB in the last 72 hours. Anemia Panel: No results for input(s): VITAMINB12, FOLATE, FERRITIN, TIBC, IRON, RETICCTPCT in the last 72  hours. Urine analysis:    Component Value Date/Time   COLORURINE AMBER (A) 09/20/2016 1546   APPEARANCEUR HAZY (A) 09/20/2016 1546   LABSPEC 1.014 09/20/2016 1546   PHURINE 7.0 09/20/2016 1546   GLUCOSEU 150 (A) 09/20/2016 1546   HGBUR NEGATIVE 09/20/2016 1546   BILIRUBINUR SMALL (A) 09/20/2016 1546   KETONESUR NEGATIVE 09/20/2016 1546   PROTEINUR 100 (A) 09/20/2016 1546   UROBILINOGEN 2.0 (H) 06/14/2015 0840   NITRITE NEGATIVE 09/20/2016 1546   LEUKOCYTESUR NEGATIVE 09/20/2016 1546   Sepsis Labs: '@LABRCNTIP'$ (procalcitonin:4,lacticidven:4)  ) Recent Results (from the past 240 hour(s))  Urine culture     Status: Abnormal   Collection Time: 09/20/16  3:46 PM  Result Value Ref Range Status   Specimen Description URINE, CLEAN CATCH  Final   Special Requests NONE  Final   Culture MULTIPLE SPECIES PRESENT, SUGGEST RECOLLECTION (A)  Final   Report Status 09/21/2016 FINAL  Final  Blood Culture (routine x 2)     Status: Abnormal   Collection Time: 09/20/16  3:50 PM  Result Value Ref Range Status   Specimen Description BLOOD LEFT ANTECUBITAL  Final   Special Requests BOTTLES DRAWN AEROBIC AND ANAEROBIC 5CC  Final   Culture  Setup Time   Final    GRAM NEGATIVE RODS AEROBIC BOTTLE ONLY CRITICAL RESULT CALLED TO, READ BACK BY AND VERIFIED WITH: A JOHNSTON,PHARMD AT 5686 09/21/16 BY L BENFIELD    Culture ESCHERICHIA COLI (A)  Final   Report Status 09/23/2016 FINAL  Final   Organism ID, Bacteria ESCHERICHIA COLI  Final      Susceptibility   Escherichia coli - MIC*    AMPICILLIN >=32 RESISTANT Resistant     CEFAZOLIN >=64 RESISTANT Resistant     CEFEPIME <=1 SENSITIVE Sensitive     CEFTAZIDIME <=1 SENSITIVE Sensitive     CEFTRIAXONE <=1 SENSITIVE  Sensitive     CIPROFLOXACIN >=4 RESISTANT Resistant     GENTAMICIN >=16 RESISTANT Resistant     IMIPENEM <=0.25 SENSITIVE Sensitive     TRIMETH/SULFA >=320 RESISTANT Resistant     AMPICILLIN/SULBACTAM >=32 RESISTANT Resistant     PIP/TAZO >=128 RESISTANT Resistant     Extended ESBL NEGATIVE Sensitive     * ESCHERICHIA COLI  Blood Culture ID Panel (Reflexed)     Status: Abnormal   Collection Time: 09/20/16  3:50 PM  Result Value Ref Range Status   Enterococcus species NOT DETECTED NOT DETECTED Final   Listeria monocytogenes NOT DETECTED NOT DETECTED Final   Staphylococcus species NOT DETECTED NOT DETECTED Final   Staphylococcus aureus NOT DETECTED NOT DETECTED Final   Streptococcus species NOT DETECTED NOT DETECTED Final   Streptococcus agalactiae NOT DETECTED NOT DETECTED Final   Streptococcus pneumoniae NOT DETECTED NOT DETECTED Final   Streptococcus pyogenes NOT DETECTED NOT DETECTED Final   Acinetobacter baumannii NOT DETECTED NOT DETECTED Final   Enterobacteriaceae species DETECTED (A) NOT DETECTED Final    Comment: CRITICAL RESULT CALLED TO, READ BACK BY AND VERIFIED WITH: A JOHNSTON,PHARMD AT 0841 09/21/16 BY L BENFIELD    Enterobacter cloacae complex NOT DETECTED NOT DETECTED Final   Escherichia coli DETECTED (A) NOT DETECTED Final    Comment: CRITICAL RESULT CALLED TO, READ BACK BY AND VERIFIED WITH: A JOHNSTON,PHARMD AT 1683 09/21/16 BY L BENFIELD    Klebsiella oxytoca NOT DETECTED NOT DETECTED Final   Klebsiella pneumoniae NOT DETECTED NOT DETECTED Final   Proteus species NOT DETECTED NOT DETECTED Final   Serratia marcescens NOT DETECTED NOT DETECTED Final  Carbapenem resistance NOT DETECTED NOT DETECTED Final   Haemophilus influenzae NOT DETECTED NOT DETECTED Final   Neisseria meningitidis NOT DETECTED NOT DETECTED Final   Pseudomonas aeruginosa NOT DETECTED NOT DETECTED Final   Candida albicans NOT DETECTED NOT DETECTED Final   Candida glabrata NOT DETECTED NOT  DETECTED Final   Candida krusei NOT DETECTED NOT DETECTED Final   Candida parapsilosis NOT DETECTED NOT DETECTED Final   Candida tropicalis NOT DETECTED NOT DETECTED Final  Blood Culture (routine x 2)     Status: Abnormal   Collection Time: 09/20/16  3:55 PM  Result Value Ref Range Status   Specimen Description BLOOD RIGHT ANTECUBITAL  Final   Special Requests BOTTLES DRAWN AEROBIC AND ANAEROBIC 5CC  Final   Culture  Setup Time   Final    GRAM NEGATIVE RODS AEROBIC BOTTLE ONLY CRITICAL VALUE NOTED.  VALUE IS CONSISTENT WITH PREVIOUSLY REPORTED AND CALLED VALUE.    Culture (A)  Final    KLEBSIELLA PNEUMONIAE CRITICAL RESULT CALLED TO, READ BACK BY AND VERIFIED WITH: J. [place], [place] [id number] BY Rhea Bleacher    Report Status 09/26/2016 FINAL  Final   Organism ID, Bacteria KLEBSIELLA PNEUMONIAE  Final      Susceptibility   Klebsiella pneumoniae - MIC*    AMPICILLIN >=32 RESISTANT Resistant     CEFAZOLIN <=4 SENSITIVE Sensitive     CEFEPIME <=1 SENSITIVE Sensitive     CEFTAZIDIME <=1 SENSITIVE Sensitive     CEFTRIAXONE <=1 SENSITIVE Sensitive     CIPROFLOXACIN <=0.25 SENSITIVE Sensitive     GENTAMICIN <=1 SENSITIVE Sensitive     IMIPENEM <=0.25 SENSITIVE Sensitive     TRIMETH/SULFA <=20 SENSITIVE Sensitive     AMPICILLIN/SULBACTAM 4 SENSITIVE Sensitive     PIP/TAZO <=4 SENSITIVE Sensitive     Extended ESBL NEGATIVE Sensitive     * KLEBSIELLA PNEUMONIAE  MRSA PCR Screening     Status: None   Collection Time: 09/21/16 12:27 AM  Result Value Ref Range Status   MRSA by PCR NEGATIVE NEGATIVE Final    Comment:        The GeneXpert MRSA Assay (FDA approved for NASAL specimens only), is one component of a comprehensive MRSA colonization surveillance program. It is not intended to diagnose MRSA infection nor to guide or monitor treatment for MRSA infections.   Culture, blood (routine x 2)     Status: None (Preliminary result)   Collection Time: 09/24/16  5:48 AM    Result Value Ref Range Status   Specimen Description BLOOD RIGHT ANTECUBITAL  Final   Special Requests IN PEDIATRIC BOTTLE 2CC  Final   Culture NO GROWTH 2 DAYS  Final   Report Status PENDING  Incomplete  Culture, blood (routine x 2)     Status: None (Preliminary result)   Collection Time: 09/24/16  5:48 AM  Result Value Ref Range Status   Specimen Description BLOOD BLOOD RIGHT HAND  Final   Special Requests IN PEDIATRIC BOTTLE 2CC  Final   Culture NO GROWTH 2 DAYS  Final   Report Status PENDING  Incomplete         Radiology Studies: No results found.      Scheduled Meds: . cefTRIAXone (ROCEPHIN)  IV  2 g Intravenous Q24H  . fluticasone furoate-vilanterol  1 puff Inhalation Daily  . insulin aspart  0-9 Units Subcutaneous Q4H  . metoprolol tartrate  12.5 mg Oral BID  . metronidazole  500 mg Intravenous Q8H   Continuous Infusions:  LOS: 7 days    Time spent: 40 minutes    Trebor Galdamez, Geraldo Docker, MD [facility] Pager [phone]   If 7PM-7AM, please contact night-coverage [email or web address] Password [facility] 09/27/2016, 2:38 PM

## 2016-09-27 NOTE — Progress Notes (Signed)
Palliative Medicine consult noted. Due to high referral volume, there may be a delay seeing this patient. Please call the Palliative Medicine Team office at [phone] if recommendations are needed in the interim.  Thank you for inviting us to see this patient.  Moxon Messler G. Kiley Torrence, RN, BSN, CHPN 09/27/2016 3:11 PM Cell [phone] 8:00-4:00 Monday-Friday Office [phone] 

## 2016-09-27 NOTE — Progress Notes (Signed)
Subjective: See below.  Objective: Vital signs in last 24 hours: Temp:  [97.8 F (36.6 C)-99.6 F (37.6 C)] 98.4 F (36.9 C) (01/04 1302) Pulse Rate:  [102-115] 103 (01/04 1345) Resp:  [16-39] 30 (01/04 1345) BP: (76-109)/(50-77) 87/56 (01/04 1345) SpO2:  [95 %-100 %] 100 % (01/04 1345) Weight:  [76.3 kg (168 lb 3.4 oz)] 76.3 kg (168 lb 3.4 oz) (01/04 0354) Last BM Date: 09/25/16  Intake/Output from previous day: 01/03 0701 - 01/04 0700 In: 1760 [I.V.:1350; IV Piggyback:400] Out: 2925 [Urine:50; Drains:2875] Intake/Output this shift: Total I/O In: 558.8 [I.V.:408.8; IV Piggyback:150] Out: 1000 [Drains:1000]  General appearance: semi-responsive, stares off into the distance, increased respiration GI: soft, non-tender; bowel sounds normal; no masses,  no organomegaly  Lab Results:  Recent Labs  09/25/16 0229 09/26/16 0212 09/27/16 0317  WBC 14.7* 14.3* 17.4*  HGB 15.2 15.4 15.0  HCT 45.3 46.8 45.0  PLT 342 249 305   BMET  Recent Labs  09/25/16 0229 09/26/16 1224 09/27/16 0317  NA 143 146* 147*  K 4.1 4.3 4.7  CL 110 111 113*  CO2 23 21* 21*  GLUCOSE 204* 244* 199*  BUN 16 36* 44*  CREATININE 1.20 3.49* 4.70*  CALCIUM 9.4 9.4 9.2   LFT  Recent Labs  09/27/16 0317  PROT 5.8*  ALBUMIN 2.8*  AST 61*  ALT 28  ALKPHOS 135*  BILITOT 2.0*   PT/INR No results for input(s): LABPROT, INR in the last 72 hours. Hepatitis Panel No results for input(s): HEPBSAG, HCVAB, HEPAIGM, HEPBIGM in the last 72 hours. C-Diff No results for input(s): CDIFFTOX in the last 72 hours. Fecal Lactopherrin No results for input(s): FECLLACTOFRN in the last 72 hours.  Studies/Results: No results found.  Medications:  Scheduled: . [MAR Hold] cefTRIAXone (ROCEPHIN)  IV  2 g Intravenous Q24H  . [MAR Hold] fluticasone furoate-vilanterol  1 puff Inhalation Daily  . [MAR Hold] insulin aspart  0-9 Units Subcutaneous Q4H  . [MAR Hold] metoprolol tartrate  12.5 mg Oral BID  .  [MAR Hold] metronidazole  500 mg Intravenous Q8H   Continuous: . sodium chloride      Assessment/Plan: 1) Increasing WBC. 2) Decline in respiratory status.   [name] clinical status has declined since I last saw him yesterday.  His wife also notes that he is not well over the past 48 hours.  Anesthesia evaluated the patient and they do not want to put him under general anesthesia as he is too sick.  I had a long discussion about end of life issues with his wife and with Dr. Sherral Hammers.  His wife knows that he would not want to linger and she is open to speaking with hospice, however, she wants to try medical treatment for 2-3 more days.  This is to see if he can improve.  Plan: 1) Hospice consult. 2) Agree with trying medical management to improve his clinical status for the next 48-72 hours.  LOS: 7 days   [name] D 09/27/2016, 2:15 PM

## 2016-09-27 NOTE — Plan of Care (Signed)
Problem: Safety: Goal: Ability to remain free from injury will improve Outcome: Progressing Pt is still confused and somewhat impulsive, however he has been able to safely transition out of restraints and into pt safety mittens.

## 2016-09-27 NOTE — Progress Notes (Signed)
Patient admitted to Endo unit. Patient tachycardiac in low 100's, tachypnenic with RR 25-35, BP 90's/50's (see flowsheet for BP changes). Patient presented with shallow, labored breathing on 2L [place]. Lungs sounded clear, slightly diminished in the bases. Patient responsive occasionally to voice, but at all times to touch. Patient non-verbal with response, he would shake his head yes or no and then fall back asleep. Spoke with anesthesiologist and procedural MD about case and decision was made to cancel case. Dr. Sherral Hammers called and made aware of patient's status and VS and that the case had been cancelled. Verbal order to increase IVF to 158ml/hr given and he would assess patient upon arrival back to unit. [place] called and updated on patient's status and case cancellation.

## 2016-09-28 DIAGNOSIS — A419 Sepsis, unspecified organism: Secondary | ICD-10-CM

## 2016-09-28 LAB — CBC WITH DIFFERENTIAL/PLATELET
BASOS ABS: 0 10*3/uL (ref 0.0–0.1)
Basophils Relative: 0 %
EOS ABS: 0.2 10*3/uL (ref 0.0–0.7)
Eosinophils Relative: 1 %
HCT: 41.8 % (ref 39.0–52.0)
Hemoglobin: 13.8 g/dL (ref 13.0–17.0)
LYMPHS ABS: 3.7 10*3/uL (ref 0.7–4.0)
Lymphocytes Relative: 21 %
MCH: 31.7 pg (ref 26.0–34.0)
MCHC: 33 g/dL (ref 30.0–36.0)
MCV: 95.9 fL (ref 78.0–100.0)
Monocytes Absolute: 0.7 10*3/uL (ref 0.1–1.0)
Monocytes Relative: 4 %
NEUTROS ABS: 13 10*3/uL — AB (ref 1.7–7.7)
Neutrophils Relative %: 74 %
Platelets: 314 10*3/uL (ref 150–400)
RBC: 4.36 MIL/uL (ref 4.22–5.81)
RDW: 14.4 % (ref 11.5–15.5)
WBC: 17.6 10*3/uL — AB (ref 4.0–10.5)

## 2016-09-28 LAB — GLUCOSE, CAPILLARY
GLUCOSE-CAPILLARY: 150 mg/dL — AB (ref 65–99)
GLUCOSE-CAPILLARY: 182 mg/dL — AB (ref 65–99)
GLUCOSE-CAPILLARY: 183 mg/dL — AB (ref 65–99)
GLUCOSE-CAPILLARY: 221 mg/dL — AB (ref 65–99)
Glucose-Capillary: 170 mg/dL — ABNORMAL HIGH (ref 65–99)
Glucose-Capillary: 177 mg/dL — ABNORMAL HIGH (ref 65–99)
Glucose-Capillary: 200 mg/dL — ABNORMAL HIGH (ref 65–99)

## 2016-09-28 NOTE — Progress Notes (Signed)
I spoke at length with the patient's daughter.  We discussed the current situation, the reasons why he may have deteriorated, and the forseeable future.  I do not have any new recommendations.  Signing off.

## 2016-09-28 NOTE — Progress Notes (Signed)
Patient Demographics:    [name], is a 83 y.o. male, DOB - June 03, 1927, [id number]  Admit date - 09/20/2016   Admitting Physician Rise Patience, MD  Outpatient Primary MD for the patient is Stephens Shire, MD  LOS - 8   Chief Complaint  Patient presents with  . Code Sepsis        Subjective:    [name] today has no fevers, no emesis,  No chest pain,Confused  Assessment  & Plan :    Principal Problem:   Sepsis ([place]) Active Problems:   Coronary artery disease   DM2 (diabetes mellitus, type 2) (HCC)   Systolic CHF, chronic (HCC)   COPD with emphysema (HCC)   Pacemaker-St.Jude   Acute cholangitis   Acute encephalopathy   CAD in native artery   AV block, Mobitz II   Uncontrolled type 2 diabetes mellitus with complication (HCC)   Dementia associated with other underlying disease with behavioral disturbance   Chronic diastolic CHF (congestive heart failure) (HCC)   Cholangitis   Bacteremia due to Klebsiella pneumoniae   Bacteremia due to Escherichia coli  Interval history 83 y.o.WM PMHx 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 As per patient's wife patient wasgetting weak over the last 2 days and today while walking to the dining table patient's slumped to the. Did not hit his head or lose consciousness. In the ER patient was found to be hypotensive with labs showing markedly elevated LFTs and Lactate. Patient has known history of CBD stones with recurrent obstruction. Has had stents placed previously. CT abdomen and pelvis was done which shows dilated duct with infrarenal aortic aneurysm. Dr. Benson Rich, patient's gastroenterologist was consulted and since patient has difficult  ERCP previously IR has been consulted for drain placement and has been placed by the time I examined the patient. On my exam patient appears confused and moves all extremities   1)Sepsis/Cholangitis - -S/P IR placement Biliary drain ,  infectious disease recommendation was for 10 day course of antibiotics ( Dr. Michel Bickers), Dr. Carol Ada [facility] was going to replace stent on 09/27/16 however when patient was taken to OR , anesthesiology and Dr. Benson Rich felt patient too unstable to survive procedure and procedure was aborted.  Patient's wife requested palliative and hospice care consult  2)positive E.coli/Enterobacteriaceae/KLEBSIELLA PNEUMONIAE bacteremia- 09/20/16 blood culture with pansensitive Klebsiella, repeat blood cultures from 09/24/2016 negative, currently on Rocephin and Flagyl   3)Acute on Chronic Encephalopathy- no significant improvement, according to patient's wife is still improvement over the weekend she will go with hospice, we will limit when necessary benzos, CT head negative, patient does have episodes of agitation at times  4)Social/Ethics- according to patient's wife if no improvement over the weekend she will go with hospice on Monday, palliative care input appreciated   Code Status : DNR   Disposition Plan  : Possible discharge with hospice  Consults  :  GI, general surgery, infectious disease and palliative care   DVT Prophylaxis  :  SCDs   Lab Results  Component Value  Date   PLT 314 09/28/2016    Inpatient Medications  Scheduled Meds: . cefTRIAXone (ROCEPHIN)  IV  2 g Intravenous Q24H  . fluticasone furoate-vilanterol  1 puff Inhalation Daily  . insulin aspart  0-15 Units Subcutaneous Q4H  . metoprolol tartrate  12.5 mg Oral BID  . metronidazole  500 mg Intravenous Q8H   Continuous Infusions: . dextrose 5 % and 0.45% NaCl 125 mL/hr at 09/28/16 1250   PRN Meds:.LORazepam, nitroGLYCERIN, ondansetron **OR** ondansetron (ZOFRAN) IV, RESOURCE THICKENUP  CLEAR    Anti-infectives    Start     Dose/Rate Route Frequency Ordered Stop   09/21/16 1200  levofloxacin (LEVAQUIN) IVPB 500 mg  Status:  Discontinued     500 mg 100 mL/hr over 60 Minutes Intravenous Every 24 hours 09/21/16 0010 09/21/16 1143   09/21/16 1145  cefTRIAXone (ROCEPHIN) 2 g in dextrose 5 % 50 mL IVPB     2 g 100 mL/hr over 30 Minutes Intravenous Every 24 hours 09/21/16 1143     09/21/16 0800  metroNIDAZOLE (FLAGYL) IVPB 500 mg     500 mg 100 mL/hr over 60 Minutes Intravenous Every 8 hours 09/21/16 0010     09/21/16 0000  levofloxacin (LEVAQUIN) IVPB 750 mg  Status:  Discontinued     750 mg 100 mL/hr over 90 Minutes Intravenous  Once 09/20/16 2345 09/21/16 0005   09/21/16 0000  metroNIDAZOLE (FLAGYL) IVPB 500 mg     500 mg 100 mL/hr over 60 Minutes Intravenous  Once 09/20/16 2345 09/21/16 0302   09/20/16 1700  metroNIDAZOLE (FLAGYL) IVPB 500 mg     500 mg 100 mL/hr over 60 Minutes Intravenous  Once 09/20/16 1648 09/20/16 1815   09/20/16 1630  cefTRIAXone (ROCEPHIN) 1 g in dextrose 5 % 50 mL IVPB     1 g 100 mL/hr over 30 Minutes Intravenous  Once 09/20/16 1619 09/20/16 1658   09/20/16 1630  azithromycin (ZITHROMAX) 500 mg in dextrose 5 % 250 mL IVPB     500 mg 250 mL/hr over 60 Minutes Intravenous  Once 09/20/16 1619 09/20/16 1727        Objective:   Vitals:   09/28/16 0700 09/28/16 0856 09/28/16 1100 09/28/16 1600  BP:      Pulse:      Resp:      Temp: 99.1 F (37.3 C)  99.5 F (37.5 C) 97.8 F (36.6 C)  TempSrc: Oral  Axillary Axillary  SpO2:  100%    Weight:      Height:        Wt Readings from Last 3 Encounters:  09/28/16 75.7 kg (166 lb 14.2 oz)  05/14/16 86.4 kg (190 lb 6.4 oz)  03/30/16 84.7 kg (186 lb 11.2 oz)     Intake/Output Summary (Last 24 hours) at 09/28/16 1740 Last data filed at 09/28/16 1700  Gross per 24 hour  Intake             2325 ml  Output             2555 ml  Net             -230 ml     Physical Exam  Gen:-  Awake, Disoriented,  does not follow commands HEENT:- [place].AT, No sclera icterus Neck-Supple Neck,No JVD,.  Lungs-  CTAB , anteriorly CV- S1, S2 normal Abd-  +ve B.Sounds, Abd Soft, No tenderness,    Extremity/Skin:- Warm and dry    Data Review:   Micro Results Recent  Results (from the past 240 hour(s))  Urine culture     Status: Abnormal   Collection Time: 09/20/16  3:46 PM  Result Value Ref Range Status   Specimen Description URINE, CLEAN CATCH  Final   Special Requests NONE  Final   Culture MULTIPLE SPECIES PRESENT, SUGGEST RECOLLECTION (A)  Final   Report Status 09/21/2016 FINAL  Final  Blood Culture (routine x 2)     Status: Abnormal   Collection Time: 09/20/16  3:50 PM  Result Value Ref Range Status   Specimen Description BLOOD LEFT ANTECUBITAL  Final   Special Requests BOTTLES DRAWN AEROBIC AND ANAEROBIC 5CC  Final   Culture  Setup Time   Final    GRAM NEGATIVE RODS AEROBIC BOTTLE ONLY CRITICAL RESULT CALLED TO, READ BACK BY AND VERIFIED WITH: A JOHNSTON,PHARMD AT [id number] 09/21/16 BY L BENFIELD    Culture ESCHERICHIA COLI (A)  Final   Report Status 09/23/2016 FINAL  Final   Organism ID, Bacteria ESCHERICHIA COLI  Final      Susceptibility   Escherichia coli - MIC*    AMPICILLIN >=32 RESISTANT Resistant     CEFAZOLIN >=64 RESISTANT Resistant     CEFEPIME <=1 SENSITIVE Sensitive     CEFTAZIDIME <=1 SENSITIVE Sensitive     CEFTRIAXONE <=1 SENSITIVE Sensitive     CIPROFLOXACIN >=4 RESISTANT Resistant     GENTAMICIN >=16 RESISTANT Resistant     IMIPENEM <=0.25 SENSITIVE Sensitive     TRIMETH/SULFA >=320 RESISTANT Resistant     AMPICILLIN/SULBACTAM >=32 RESISTANT Resistant     PIP/TAZO >=128 RESISTANT Resistant     Extended ESBL NEGATIVE Sensitive     * ESCHERICHIA COLI  Blood Culture ID Panel (Reflexed)     Status: Abnormal   Collection Time: 09/20/16  3:50 PM  Result Value Ref Range Status   Enterococcus species NOT DETECTED NOT DETECTED Final   Listeria  monocytogenes NOT DETECTED NOT DETECTED Final   Staphylococcus species NOT DETECTED NOT DETECTED Final   Staphylococcus aureus NOT DETECTED NOT DETECTED Final   Streptococcus species NOT DETECTED NOT DETECTED Final   Streptococcus agalactiae NOT DETECTED NOT DETECTED Final   Streptococcus pneumoniae NOT DETECTED NOT DETECTED Final   Streptococcus pyogenes NOT DETECTED NOT DETECTED Final   Acinetobacter baumannii NOT DETECTED NOT DETECTED Final   Enterobacteriaceae species DETECTED (A) NOT DETECTED Final    Comment: CRITICAL RESULT CALLED TO, READ BACK BY AND VERIFIED WITH: A JOHNSTON,PHARMD AT 0841 09/21/16 BY L BENFIELD    Enterobacter cloacae complex NOT DETECTED NOT DETECTED Final   Escherichia coli DETECTED (A) NOT DETECTED Final    Comment: CRITICAL RESULT CALLED TO, READ BACK BY AND VERIFIED WITH: A JOHNSTON,PHARMD AT SG:[id number] 09/21/16 BY L BENFIELD    Klebsiella oxytoca NOT DETECTED NOT DETECTED Final   Klebsiella pneumoniae NOT DETECTED NOT DETECTED Final   Proteus species NOT DETECTED NOT DETECTED Final   Serratia marcescens NOT DETECTED NOT DETECTED Final   Carbapenem resistance NOT DETECTED NOT DETECTED Final   Haemophilus influenzae NOT DETECTED NOT DETECTED Final   Neisseria meningitidis NOT DETECTED NOT DETECTED Final   Pseudomonas aeruginosa NOT DETECTED NOT DETECTED Final   Candida albicans NOT DETECTED NOT DETECTED Final   Candida glabrata NOT DETECTED NOT DETECTED Final   Candida krusei NOT DETECTED NOT DETECTED Final   Candida parapsilosis NOT DETECTED NOT DETECTED Final   Candida tropicalis NOT DETECTED NOT DETECTED Final  Blood Culture (routine x 2)     Status: Abnormal  Collection Time: 09/20/16  3:55 PM  Result Value Ref Range Status   Specimen Description BLOOD RIGHT ANTECUBITAL  Final   Special Requests BOTTLES DRAWN AEROBIC AND ANAEROBIC 5CC  Final   Culture  Setup Time   Final    GRAM NEGATIVE RODS AEROBIC BOTTLE ONLY CRITICAL VALUE NOTED.  VALUE IS  CONSISTENT WITH PREVIOUSLY REPORTED AND CALLED VALUE.    Culture (A)  Final    KLEBSIELLA PNEUMONIAE CRITICAL RESULT CALLED TO, READ BACK BY AND VERIFIED WITH: J. [place], [place] [id number] BY Rhea Bleacher    Report Status 09/26/2016 FINAL  Final   Organism ID, Bacteria KLEBSIELLA PNEUMONIAE  Final      Susceptibility   Klebsiella pneumoniae - MIC*    AMPICILLIN >=32 RESISTANT Resistant     CEFAZOLIN <=4 SENSITIVE Sensitive     CEFEPIME <=1 SENSITIVE Sensitive     CEFTAZIDIME <=1 SENSITIVE Sensitive     CEFTRIAXONE <=1 SENSITIVE Sensitive     CIPROFLOXACIN <=0.25 SENSITIVE Sensitive     GENTAMICIN <=1 SENSITIVE Sensitive     IMIPENEM <=0.25 SENSITIVE Sensitive     TRIMETH/SULFA <=20 SENSITIVE Sensitive     AMPICILLIN/SULBACTAM 4 SENSITIVE Sensitive     PIP/TAZO <=4 SENSITIVE Sensitive     Extended ESBL NEGATIVE Sensitive     * KLEBSIELLA PNEUMONIAE  MRSA PCR Screening     Status: None   Collection Time: 09/21/16 12:27 AM  Result Value Ref Range Status   MRSA by PCR NEGATIVE NEGATIVE Final    Comment:        The GeneXpert MRSA Assay (FDA approved for NASAL specimens only), is one component of a comprehensive MRSA colonization surveillance program. It is not intended to diagnose MRSA infection nor to guide or monitor treatment for MRSA infections.   Culture, blood (routine x 2)     Status: None (Preliminary result)   Collection Time: 09/24/16  5:48 AM  Result Value Ref Range Status   Specimen Description BLOOD RIGHT ANTECUBITAL  Final   Special Requests IN PEDIATRIC BOTTLE 2CC  Final   Culture NO GROWTH 4 DAYS  Final   Report Status PENDING  Incomplete  Culture, blood (routine x 2)     Status: None (Preliminary result)   Collection Time: 09/24/16  5:48 AM  Result Value Ref Range Status   Specimen Description BLOOD BLOOD RIGHT HAND  Final   Special Requests IN PEDIATRIC BOTTLE 2CC  Final   Culture NO GROWTH 4 DAYS  Final   Report Status PENDING  Incomplete     Radiology Reports Ct Head Wo Contrast  Result Date: 09/20/2016 CLINICAL DATA:  Unwitnessed fall earlier today. Altered mental status. Dementia. EXAM: CT HEAD WITHOUT CONTRAST TECHNIQUE: Contiguous axial images were obtained from the base of the skull through the vertex without intravenous contrast. COMPARISON:  05/31/2016 head CT . FINDINGS: Brain: No evidence of parenchymal hemorrhage or extra-axial fluid collection. No mass lesion, mass effect, or midline shift. No CT evidence of acute infarction. Intracranial atherosclerosis. Generalized cerebral volume loss. Nonspecific moderate subcortical and periventricular white matter hypodensity, most in keeping with chronic small vessel ischemic change. No ventriculomegaly. Vascular: No hyperdense vessel or unexpected calcification. Skull: No evidence of calvarial fracture. Sinuses/Orbits: The visualized paranasal sinuses are essentially clear. Other:  The mastoid air cells are unopacified. IMPRESSION: 1. No evidence of acute intracranial abnormality. No evidence of calvarial fracture. 2. Generalized cerebral volume loss and moderate chronic small vessel ischemia. Electronically Signed   By: Rinaldo Ratel  Poff M.D.   On: 09/20/2016 18:46   Ct Abdomen Pelvis W Contrast  Result Date: 09/20/2016 CLINICAL DATA:  82 year old male with dementia presents after unwitnessed fall with elevated liver function tests. History of ERCP with stone extraction and biliary stent placement 07/09/2014. EXAM: CT ABDOMEN AND PELVIS WITH CONTRAST TECHNIQUE: Multidetector CT imaging of the abdomen and pelvis was performed using the standard protocol following bolus administration of intravenous contrast. CONTRAST:  75 cc ISOVUE-300 IOPAMIDOL (ISOVUE-300) INJECTION 61% COMPARISON:  03/26/2016 CT abdomen/ pelvis. Right upper quadrant abdominal sonogram from 03/29/2016. FINDINGS: Lower chest: Right lower lobe 5 mm solid pulmonary nodule (series 6/image 1), stable since 05/03/2010 and  considered benign. Pacemaker leads are seen terminating in the right atrium and right ventricular apex. Coronary atherosclerosis. Hepatobiliary: Diffuse hepatic steatosis. No liver mass. Cholecystectomy. Stable pneumobilia in the left liver lobe. Stable mild central intrahepatic biliary ductal dilatation. Stable common bile duct diameter 16 mm. Biliary stent is in place in the lower third of the common bile duct. Pancreas: Stable nonspecific mild ectasia of the ventral pancreatic duct in the pancreatic head, maximum diameter 5 mm. No pancreatic mass. No peripancreatic fluid collections. Spleen: Normal size. No mass. Adrenals/Urinary Tract: Normal adrenals. No hydronephrosis. Simple 1.2 cm renal cyst in the interpolar right kidney. Additional subcentimeter hypodense renal cortical lesions in both kidneys are too small to characterize and appear unchanged. Normal bladder. Stomach/Bowel: Grossly normal stomach. Normal caliber small bowel with no small bowel wall thickening. Normal appendix . Mild sigmoid diverticulosis, with no large bowel wall thickening or pericolonic fat stranding. Vascular/Lymphatic: Abdominal aortic atherosclerosis. Stable 3.6 cm infrarenal abdominal aortic aneurysm. Patent portal, splenic, hepatic and renal veins. No pathologically enlarged lymph nodes in the abdomen or pelvis. Reproductive: Normal size prostate. Nonspecific coarse internal prostatic calcifications are stable. Other: No pneumoperitoneum, ascites or focal fluid collection. Stable small fat containing left inguinal hernia. Musculoskeletal: No aggressive appearing focal osseous lesions. No fracture. Moderate thoracolumbar spondylosis. IMPRESSION: 1. No acute traumatic injury or other acute abnormality in the abdomen or pelvis. 2. Diffuse hepatic steatosis.  No liver mass. 3. Status post cholecystectomy. Stable biliary ductal dilatation with common bile duct diameter 16 mm. Stable well-positioned biliary stent in the lower third of  the common bile duct with expected pneumobilia. 4. Aortic atherosclerosis. Stable 3.6 cm infrarenal abdominal aortic aneurysm. Recommend followup by ultrasound in 2 years. This recommendation follows ACR consensus guidelines: White Paper of the ACR Incidental Findings Committee II on Vascular Findings. J Am Coll Radiol 2013; 10:789-794. 5. Coronary atherosclerosis. 6. Mild sigmoid diverticulosis. 7. Stable small fat containing left inguinal hernia. Electronically Signed   By: Ilona Sorrel M.D.   On: 09/20/2016 18:21   Dg Chest Port 1 View  Result Date: 09/20/2016 CLINICAL DATA:  Fever.  The patient fell. EXAM: PORTABLE CHEST 1 VIEW COMPARISON:  03/26/2016 FINDINGS: Heart size and pulmonary vascularity are normal and the lungs are clear. Pacemaker in place. No acute bone abnormality. IMPRESSION: No acute abnormalities. Electronically Signed   By: Lorriane Shire M.D.   On: 09/20/2016 16:14   Ir Int Lianne Cure Biliary Drain With Cholangiogram  Result Date: 09/20/2016 INDICATION: 83 year old male with acute cholangitis with a possibly obstructed biliary stent. EXAM: IR INT-EXT BILIARY DRAIN W/ CHOLANGIOGRAM MEDICATIONS: Patient recently received intravenous Rocephin, azithromycin and Flagyl while in the emergency room. No additional antibiotic prophylaxis administered. ANESTHESIA/SEDATION: Moderate (conscious) sedation was employed during this procedure. A total of Versed 1.5 mg and Fentanyl 125 mcg was administered intravenously. Moderate Sedation  Time: 54 minutes. [name] level of consciousness and vital signs were monitored continuously by radiology nursing throughout the procedure under my direct supervision. FLUOROSCOPY TIME:  Fluoroscopy Time: 28 minutes 30 seconds (851 mGy). COMPLICATIONS: None immediate. PROCEDURE: Informed written consent was obtained from the patient after a thorough discussion of the procedural risks, benefits and alternatives. All questions were addressed. Maximal Sterile Barrier  Technique was utilized including caps, mask, sterile gowns, sterile gloves, sterile drape, hand hygiene and skin antiseptic. A timeout was performed prior to the initiation of the procedure. The liver was interrogated with ultrasound. There is no significant intrahepatic biliary ductal dilatation outside of the hilum. A Chiba needle was advanced from a lateral approach at the level of the mid axillary line. The needle was advanced of the hepatic parenchyma in then slowly withdrawn well injecting contrast material and still a of biliary radicle was then captured. A percutaneous transhepatic cholangiogram was then performed. There is significant dilatation of the common bile duct and central hepatic ducts. The more peripheral hepatic ducts are decompressed. The biliary stent is occluded, however a small amount of contrast material passes adjacent to the biliary stent and into the duodenum. A night tracks wire was successfully navigated into the biliary tree. The GDA needle was then exchanged for the Accustick sheath. A gentle hand injection of contrast material under fluoroscopy confirmed sheath location within the dilated common bile duct. A 4 French angled glide catheter was advanced coaxially through the Accustick sheath an successfully navigated adjacent to the stent and into the duodenum overall wire. An Amplatz wire was then advanced into the duodenum. The Accustick sheath and 4 French catheter were removed. The skin tract was dilated to 10 [place] and a Cook 10.2 [place] biliary drainage catheter was advanced over the wire and formed with the locking loop in the duodenum. Hand injection of contrast material confirms final tube placement. Overall, the patient tolerated the procedure well. IMPRESSION: 1. Percutaneous transhepatic cholangiogram demonstrates an occluded stent in the common bile duct. There is slow passage of a small amount of contrast material adjacent to the stent, through the ampulla and into the  duodenum. 2. There is significant dilatation of the common hepatic and common bile ducts. 3. Successful placement of a 10.2 French internal/external biliary drainage catheter which passes adjacent to the occluded stent. Signed, Criselda Peaches, MD [facility] Electronically Signed   By: Jacqulynn Cadet M.D.   On: 09/20/2016 22:06     CBC  Recent Labs Lab 09/24/16 0548 09/25/16 0229 09/26/16 0212 09/27/16 0317 09/28/16 0623  WBC 10.0 14.7* 14.3* 17.4* 17.6*  HGB 13.9 15.2 15.4 15.0 13.8  HCT 41.9 45.3 46.8 45.0 41.8  PLT 319 342 249 305 314  MCV 95.2 94.0 96.5 95.1 95.9  MCH 31.6 31.5 31.8 31.7 31.7  MCHC 33.2 33.6 32.9 33.3 33.0  RDW 14.2 14.1 14.6 14.3 14.4  LYMPHSABS 2.1 3.0 2.7 3.8 3.7  MONOABS 0.4 0.8 0.6 0.9 0.7  EOSABS 0.0 0.1 0.0 0.0 0.2  BASOSABS 0.0 0.0 0.0 0.0 0.0    Chemistries   Recent Labs Lab 09/22/16 0925 09/24/16 0548 09/25/16 0229 09/26/16 0212 09/26/16 1224 09/27/16 0317  NA 139 144 143  --  146* 147*  K 3.3* 4.6 4.1  --  4.3 4.7  CL 112* 111 110  --  111 113*  CO2 18* 20* 23  --  21* 21*  GLUCOSE 151* 145* 204*  --  244* 199*  BUN 12 14 16   --  36* 44*  CREATININE 1.16 1.20 1.20  --  3.49* 4.70*  CALCIUM 8.4* 9.0 9.4  --  9.4 9.2  MG 1.7 2.2 2.2 2.3  --  2.7*  AST 153*  --  32  --  39 61*  ALT 156*  --  59  --  36 28  ALKPHOS 176*  --  171*  --  145* 135*  BILITOT 1.1  --  0.5  --  0.8 2.0*   ------------------------------------------------------------------------------------------------------------------ No results for input(s): CHOL, HDL, LDLCALC, TRIG, CHOLHDL, LDLDIRECT in the last 72 hours.  Lab Results  Component Value Date   HGBA1C 7.9 (H) 03/27/2016   ------------------------------------------------------------------------------------------------------------------ No results for input(s): TSH, T4TOTAL, T3FREE, THYROIDAB in the last 72 hours.  Invalid input(s):  FREET3 ------------------------------------------------------------------------------------------------------------------ No results for input(s): VITAMINB12, FOLATE, FERRITIN, TIBC, IRON, RETICCTPCT in the last 72 hours.  Coagulation profile No results for input(s): INR, PROTIME in the last 168 hours.  No results for input(s): DDIMER in the last 72 hours.  Cardiac Enzymes No results for input(s): CKMB, TROPONINI, MYOGLOBIN in the last 168 hours.  Invalid input(s): CK ------------------------------------------------------------------------------------------------------------------ No results found for: BNP   Lillyan Hitson M.D on 09/28/2016 at 5:40 PM  Between 7am to 7pm - Pager - [phone]  After 7pm go to [email or web address] - password [email or web address]  [facility] -  Office  [phone]  Dragon dictation system was used to create this note, attempts have been made to correct errors, however presence of uncorrected errors is not a reflection quality of care provided

## 2016-09-29 LAB — CBC WITH DIFFERENTIAL/PLATELET
Basophils Absolute: 0 10*3/uL (ref 0.0–0.1)
Basophils Relative: 0 %
EOS PCT: 1 %
Eosinophils Absolute: 0.1 10*3/uL (ref 0.0–0.7)
HCT: 42.3 % (ref 39.0–52.0)
Hemoglobin: 13.8 g/dL (ref 13.0–17.0)
LYMPHS PCT: 26 %
Lymphs Abs: 5.5 10*3/uL — ABNORMAL HIGH (ref 0.7–4.0)
MCH: 31.3 pg (ref 26.0–34.0)
MCHC: 32.6 g/dL (ref 30.0–36.0)
MCV: 95.9 fL (ref 78.0–100.0)
MONO ABS: 1.3 10*3/uL — AB (ref 0.1–1.0)
MONOS PCT: 6 %
NEUTROS ABS: 14.7 10*3/uL — AB (ref 1.7–7.7)
Neutrophils Relative %: 67 %
PLATELETS: 332 10*3/uL (ref 150–400)
RBC: 4.41 MIL/uL (ref 4.22–5.81)
RDW: 14.3 % (ref 11.5–15.5)
WBC: 21.6 10*3/uL — ABNORMAL HIGH (ref 4.0–10.5)

## 2016-09-29 LAB — CULTURE, BLOOD (ROUTINE X 2)
Culture: NO GROWTH
Culture: NO GROWTH

## 2016-09-29 LAB — GLUCOSE, CAPILLARY
GLUCOSE-CAPILLARY: 176 mg/dL — AB (ref 65–99)
Glucose-Capillary: 215 mg/dL — ABNORMAL HIGH (ref 65–99)
Glucose-Capillary: 210 mg/dL — ABNORMAL HIGH (ref 65–99)
Glucose-Capillary: 170 mg/dL — ABNORMAL HIGH (ref 65–99)
Glucose-Capillary: 188 mg/dL — ABNORMAL HIGH (ref 65–99)
Glucose-Capillary: 187 mg/dL — ABNORMAL HIGH (ref 65–99)

## 2016-09-29 MED ORDER — SODIUM CHLORIDE 0.9 % IV BOLUS (SEPSIS)
250.0000 mL | Freq: Once | INTRAVENOUS | Status: AC
  Administered 2016-09-29: 250 mL via INTRAVENOUS

## 2016-09-29 NOTE — Progress Notes (Signed)
Upon shift change report RN walked into patient gurgling and frothy white mucous/secretions flowing out mouth. RN attempt to suction orally with yaunker and suction cath, patient does not allow, RN from night shift in room, having to assist. RN paging MD to talk about patients airway, and wondering if PRN NT suction order or nasal trumpet can be placed.

## 2016-09-29 NOTE — Progress Notes (Signed)
Family pulled Rn to the side to discuss plans, and stated they want to move forward with Hospice, today if possible. Wife spoke with daughter and they both agreed.

## 2016-09-29 NOTE — Progress Notes (Signed)
Patient Demographics:    [name], is a 83 y.o. male, DOB - Nov 01, 1926, US:[id number]  Admit date - 09/20/2016   Admitting Physician Rise Patience, MD  Outpatient Primary MD for the patient is Todd Shire, MD  LOS - 9   Chief Complaint  Patient presents with  . Code Sepsis        Subjective:    [name] today has no fevers, no emesis,  Increased secretions, gurgling sounding , Poor urine output, more lethargic, soft blood pressures, Overall patient looks ill, patient's wife sister and brother-in-law at bedside, poor prognosis discussed their request hospice involvement  Assessment  & Plan :    Principal Problem:   Sepsis ([place]) Active Problems:   Coronary artery disease   DM2 (diabetes mellitus, type 2) (HCC)   Systolic CHF, chronic ([place])   COPD with emphysema ([place])   Pacemaker-St.[employer]ude   Acute cholangitis   Acute encephalopathy   CAD in native artery   AV block, Mobitz II   Uncontrolled type 2 diabetes mellitus with complication ([place])   Dementia associated with other underlying disease with behavioral disturbance   Chronic diastolic CHF (congestive heart failure) (HCC)   Cholangitis   Bacteremia due to Klebsiella pneumoniae   Bacteremia due to Escherichia coli  Interval history 83 y.o.WMPMHxDementia in Alzheimer's disease with delirium CAD S/P stenting, 2d degreeAV block Mobitz type II S/P pacemaker placement, DM type 2, Chronic Diastolic CHF, HTN, HLD, COPD,   Brought to the ER afterpatient was found to be weak and confused. As per patient's wife patient wasgetting weak over the last 2 days and today while walking to the dining table patient's slumped to the. Did not hit his head or lose consciousness. In the ER patient was found to be hypotensive with labs showing markedly elevated LFTs and Lactate. Patient has known history of CBD stones with recurrent  obstruction. Has had stents placed previously. CT abdomen and pelvis was done which shows dilated duct with infrarenal aortic aneurysm. Dr. Benson Norway, patient's gastroenterologist was consulted and since patient has difficult ERCP previously IR has been consulted for drain placement and has been placed by the time I examined the patient. On my exam patient appears confused and moves all extremities.  Gi Service signed off on 09/29/16 Possible transfer to hospice home on 09/30/2016 with hospice and comfort measures    1)Sepsis/Cholangitis - -S/P IR placement Biliary drain ,  infectious disease recommendation was for 10 day course of antibiotics ( Dr. Michel Bickers), Dr. Doreene Burke was going to replace stent on 09/27/16 however when patient was taken to OR , anesthesiology and Dr. Nolene Bernheim patient too unstable to survive procedure and procedure was aborted.  Patient's wife requested palliative and hospice care consult.  2)positiveE.coli/Enterobacteriaceae/KLEBSIELLA PNEUMONIAE bacteremia- 09/20/16 blood culture with pansensitive Klebsiella, repeat blood cultures from 09/24/2016 negative, currently on Rocephin and Flagyl   3)Acute on Chronic Encephalopathy- no significant improvement, hospice consult appreciated, overall prognosis is grave, CT head negative, more lethargic  4)Social/Ethics-  palliative care input appreciated,  Increased secretions, gurgling sounding , Poor urine output, more lethargic, soft blood pressures, Overall patient looks ill, patient's wife sister and brother-in-law at bedside, poor prognosis discussed their request hospice involvement. Possible transfer to hospice home on  09/30/2016 with hospice and comfort measures. Overall prognosis is grave   5)AKI- renal function is worsening, creatinine above 4.7, very poor urine output  Code Status : DNR  Disposition Plan  : Possible transfer to hospice home on 09/30/2016 with hospice and comfort measures  Consults  :  GI,  general surgery, infectious disease and palliative care   DVT Prophylaxis  :  SCDs   Lab Results  Component Value Date   PLT 332 09/29/2016    Inpatient Medications  Scheduled Meds: . cefTRIAXone (ROCEPHIN)  IV  2 g Intravenous Q24H  . fluticasone furoate-vilanterol  1 puff Inhalation Daily  . insulin aspart  0-15 Units Subcutaneous Q4H  . metoprolol tartrate  12.5 mg Oral BID  . metronidazole  500 mg Intravenous Q8H   Continuous Infusions: . dextrose 5 % and 0.45% NaCl 125 mL/hr at 09/28/16 2330   PRN Meds:.LORazepam, nitroGLYCERIN, ondansetron **OR** ondansetron (ZOFRAN) IV, RESOURCE THICKENUP CLEAR    Anti-infectives    Start     Dose/Rate Route Frequency Ordered Stop   09/21/16 1200  levofloxacin (LEVAQUIN) IVPB 500 mg  Status:  Discontinued     500 mg 100 mL/hr over 60 Minutes Intravenous Every 24 hours 09/21/16 0010 09/21/16 1143   09/21/16 1145  cefTRIAXone (ROCEPHIN) 2 g in dextrose 5 % 50 mL IVPB     2 g 100 mL/hr over 30 Minutes Intravenous Every 24 hours 09/21/16 1143     09/21/16 0800  metroNIDAZOLE (FLAGYL) IVPB 500 mg     500 mg 100 mL/hr over 60 Minutes Intravenous Every 8 hours 09/21/16 0010     09/21/16 0000  levofloxacin (LEVAQUIN) IVPB 750 mg  Status:  Discontinued     750 mg 100 mL/hr over 90 Minutes Intravenous  Once 09/20/16 2345 09/21/16 0005   09/21/16 0000  metroNIDAZOLE (FLAGYL) IVPB 500 mg     500 mg 100 mL/hr over 60 Minutes Intravenous  Once 09/20/16 2345 09/21/16 0302   09/20/16 1700  metroNIDAZOLE (FLAGYL) IVPB 500 mg     500 mg 100 mL/hr over 60 Minutes Intravenous  Once 09/20/16 1648 09/20/16 1815   09/20/16 1630  cefTRIAXone (ROCEPHIN) 1 g in dextrose 5 % 50 mL IVPB     1 g 100 mL/hr over 30 Minutes Intravenous  Once 09/20/16 1619 09/20/16 1658   09/20/16 1630  azithromycin (ZITHROMAX) 500 mg in dextrose 5 % 250 mL IVPB     500 mg 250 mL/hr over 60 Minutes Intravenous  Once 09/20/16 1619 09/20/16 1727        Objective:    Vitals:   09/28/16 2122 09/28/16 2339 09/29/16 0456 09/29/16 0759  BP: 93/70 96/72 112/69 (!) 72/52  Pulse: 94 92 92 99  Resp: (!) 26 20 (!) 27 (!) 22  Temp: 98.9 F (37.2 C) 97 F (36.1 C) 97.7 F (36.5 C)   TempSrc: Axillary Axillary Axillary   SpO2: 100% 99% 99% 97%  Weight:   74.4 kg (164 lb 0.4 oz)   Height:        Wt Readings from Last 3 Encounters:  09/29/16 74.4 kg (164 lb 0.4 oz)  05/14/16 86.4 kg (190 lb 6.4 oz)  03/30/16 84.7 kg (186 lb 11.2 oz)     Intake/Output Summary (Last 24 hours) at 09/29/16 0852 Last data filed at 09/29/16 0700  Gross per 24 hour  Intake             2475 ml  Output  1255 ml  Net             1220 ml     Physical Exam  Gen:- More sleepy, respiratory distress earlier HEENT:- [place].AT, No sclera icterus Lungs- sounds very rhonchorous and congested CV- S1, S2 normal Abd-  +ve B.Sounds, Abd Soft,  GU-condom catheter with very scant dark tea color urine    Data Review:   Micro Results Recent Results (from the past 240 hour(s))  Urine culture     Status: Abnormal   Collection Time: 09/20/16  3:46 PM  Result Value Ref Range Status   Specimen Description URINE, CLEAN CATCH  Final   Special Requests NONE  Final   Culture MULTIPLE SPECIES PRESENT, SUGGEST RECOLLECTION (A)  Final   Report Status 09/21/2016 FINAL  Final  Blood Culture (routine x 2)     Status: Abnormal   Collection Time: 09/20/16  3:50 PM  Result Value Ref Range Status   Specimen Description BLOOD LEFT ANTECUBITAL  Final   Special Requests BOTTLES DRAWN AEROBIC AND ANAEROBIC 5CC  Final   Culture  Setup Time   Final    GRAM NEGATIVE RODS AEROBIC BOTTLE ONLY CRITICAL RESULT CALLED TO, READ BACK BY AND VERIFIED WITH: A JOHNSTON,PHARMD AT [id number] 09/21/16 BY L BENFIELD    Culture ESCHERICHIA COLI (A)  Final   Report Status 09/23/2016 FINAL  Final   Organism ID, Bacteria ESCHERICHIA COLI  Final      Susceptibility   Escherichia coli - MIC*    AMPICILLIN >=32  RESISTANT Resistant     CEFAZOLIN >=64 RESISTANT Resistant     CEFEPIME <=1 SENSITIVE Sensitive     CEFTAZIDIME <=1 SENSITIVE Sensitive     CEFTRIAXONE <=1 SENSITIVE Sensitive     CIPROFLOXACIN >=4 RESISTANT Resistant     GENTAMICIN >=16 RESISTANT Resistant     IMIPENEM <=0.25 SENSITIVE Sensitive     TRIMETH/SULFA >=320 RESISTANT Resistant     AMPICILLIN/SULBACTAM >=32 RESISTANT Resistant     PIP/TAZO >=128 RESISTANT Resistant     Extended ESBL NEGATIVE Sensitive     * ESCHERICHIA COLI  Blood Culture ID Panel (Reflexed)     Status: Abnormal   Collection Time: 09/20/16  3:50 PM  Result Value Ref Range Status   Enterococcus species NOT DETECTED NOT DETECTED Final   Listeria monocytogenes NOT DETECTED NOT DETECTED Final   Staphylococcus species NOT DETECTED NOT DETECTED Final   Staphylococcus aureus NOT DETECTED NOT DETECTED Final   Streptococcus species NOT DETECTED NOT DETECTED Final   Streptococcus agalactiae NOT DETECTED NOT DETECTED Final   Streptococcus pneumoniae NOT DETECTED NOT DETECTED Final   Streptococcus pyogenes NOT DETECTED NOT DETECTED Final   Acinetobacter baumannii NOT DETECTED NOT DETECTED Final   Enterobacteriaceae species DETECTED (A) NOT DETECTED Final    Comment: CRITICAL RESULT CALLED TO, READ BACK BY AND VERIFIED WITH: A JOHNSTON,PHARMD AT 0841 09/21/16 BY L BENFIELD    Enterobacter cloacae complex NOT DETECTED NOT DETECTED Final   Escherichia coli DETECTED (A) NOT DETECTED Final    Comment: CRITICAL RESULT CALLED TO, READ BACK BY AND VERIFIED WITH: A JOHNSTON,PHARMD AT SG:[id number] 09/21/16 BY L BENFIELD    Klebsiella oxytoca NOT DETECTED NOT DETECTED Final   Klebsiella pneumoniae NOT DETECTED NOT DETECTED Final   Proteus species NOT DETECTED NOT DETECTED Final   Serratia marcescens NOT DETECTED NOT DETECTED Final   Carbapenem resistance NOT DETECTED NOT DETECTED Final   Haemophilus influenzae NOT DETECTED NOT DETECTED Final   Neisseria meningitidis NOT  DETECTED  NOT DETECTED Final   Pseudomonas aeruginosa NOT DETECTED NOT DETECTED Final   Candida albicans NOT DETECTED NOT DETECTED Final   Candida glabrata NOT DETECTED NOT DETECTED Final   Candida krusei NOT DETECTED NOT DETECTED Final   Candida parapsilosis NOT DETECTED NOT DETECTED Final   Candida tropicalis NOT DETECTED NOT DETECTED Final  Blood Culture (routine x 2)     Status: Abnormal   Collection Time: 09/20/16  3:55 PM  Result Value Ref Range Status   Specimen Description BLOOD RIGHT ANTECUBITAL  Final   Special Requests BOTTLES DRAWN AEROBIC AND ANAEROBIC 5CC  Final   Culture  Setup Time   Final    GRAM NEGATIVE RODS AEROBIC BOTTLE ONLY CRITICAL VALUE NOTED.  VALUE IS CONSISTENT WITH PREVIOUSLY REPORTED AND CALLED VALUE.    Culture (A)  Final    KLEBSIELLA PNEUMONIAE CRITICAL RESULT CALLED TO, READ BACK BY AND VERIFIED WITH: [employer], [place] [id number] BY Rhea Bleacher    Report Status 09/26/2016 FINAL  Final   Organism ID, Bacteria KLEBSIELLA PNEUMONIAE  Final      Susceptibility   Klebsiella pneumoniae - MIC*    AMPICILLIN >=32 RESISTANT Resistant     CEFAZOLIN <=4 SENSITIVE Sensitive     CEFEPIME <=1 SENSITIVE Sensitive     CEFTAZIDIME <=1 SENSITIVE Sensitive     CEFTRIAXONE <=1 SENSITIVE Sensitive     CIPROFLOXACIN <=0.25 SENSITIVE Sensitive     GENTAMICIN <=1 SENSITIVE Sensitive     IMIPENEM <=0.25 SENSITIVE Sensitive     TRIMETH/SULFA <=20 SENSITIVE Sensitive     AMPICILLIN/SULBACTAM 4 SENSITIVE Sensitive     PIP/TAZO <=4 SENSITIVE Sensitive     Extended ESBL NEGATIVE Sensitive     * KLEBSIELLA PNEUMONIAE  MRSA PCR Screening     Status: None   Collection Time: 09/21/16 12:27 AM  Result Value Ref Range Status   MRSA by PCR NEGATIVE NEGATIVE Final    Comment:        The GeneXpert MRSA Assay (FDA approved for NASAL specimens only), is one component of a comprehensive MRSA colonization surveillance program. It is not intended to diagnose MRSA infection nor to  guide or monitor treatment for MRSA infections.   Culture, blood (routine x 2)     Status: None (Preliminary result)   Collection Time: 09/24/16  5:48 AM  Result Value Ref Range Status   Specimen Description BLOOD RIGHT ANTECUBITAL  Final   Special Requests IN PEDIATRIC BOTTLE 2CC  Final   Culture NO GROWTH 4 DAYS  Final   Report Status PENDING  Incomplete  Culture, blood (routine x 2)     Status: None (Preliminary result)   Collection Time: 09/24/16  5:48 AM  Result Value Ref Range Status   Specimen Description BLOOD BLOOD RIGHT HAND  Final   Special Requests IN PEDIATRIC BOTTLE 2CC  Final   Culture NO GROWTH 4 DAYS  Final   Report Status PENDING  Incomplete    Radiology Reports Ct Head Wo Contrast  Result Date: 09/20/2016 CLINICAL DATA:  Unwitnessed fall earlier today. Altered mental status. Dementia. EXAM: CT HEAD WITHOUT CONTRAST TECHNIQUE: Contiguous axial images were obtained from the base of the skull through the vertex without intravenous contrast. COMPARISON:  05/31/2016 head CT . FINDINGS: Brain: No evidence of parenchymal hemorrhage or extra-axial fluid collection. No mass lesion, mass effect, or midline shift. No CT evidence of acute infarction. Intracranial atherosclerosis. Generalized cerebral volume loss. Nonspecific moderate subcortical and periventricular white matter  hypodensity, most in keeping with chronic small vessel ischemic change. No ventriculomegaly. Vascular: No hyperdense vessel or unexpected calcification. Skull: No evidence of calvarial fracture. Sinuses/Orbits: The visualized paranasal sinuses are essentially clear. Other:  The mastoid air cells are unopacified. IMPRESSION: 1. No evidence of acute intracranial abnormality. No evidence of calvarial fracture. 2. Generalized cerebral volume loss and moderate chronic small vessel ischemia. Electronically Signed   By: Ilona Sorrel M.D.   On: 09/20/2016 18:46   Ct Abdomen Pelvis W Contrast  Result Date:  09/20/2016 CLINICAL DATA:  83 year old male with dementia presents after unwitnessed fall with elevated liver function tests. History of ERCP with stone extraction and biliary stent placement 07/09/2014. EXAM: CT ABDOMEN AND PELVIS WITH CONTRAST TECHNIQUE: Multidetector CT imaging of the abdomen and pelvis was performed using the standard protocol following bolus administration of intravenous contrast. CONTRAST:  75 cc ISOVUE-300 IOPAMIDOL (ISOVUE-300) INJECTION 61% COMPARISON:  03/26/2016 CT abdomen/ pelvis. Right upper quadrant abdominal sonogram from 03/29/2016. FINDINGS: Lower chest: Right lower lobe 5 mm solid pulmonary nodule (series 6/image 1), stable since 05/03/2010 and considered benign. Pacemaker leads are seen terminating in the right atrium and right ventricular apex. Coronary atherosclerosis. Hepatobiliary: Diffuse hepatic steatosis. No liver mass. Cholecystectomy. Stable pneumobilia in the left liver lobe. Stable mild central intrahepatic biliary ductal dilatation. Stable common bile duct diameter 16 mm. Biliary stent is in place in the lower third of the common bile duct. Pancreas: Stable nonspecific mild ectasia of the ventral pancreatic duct in the pancreatic head, maximum diameter 5 mm. No pancreatic mass. No peripancreatic fluid collections. Spleen: Normal size. No mass. Adrenals/Urinary Tract: Normal adrenals. No hydronephrosis. Simple 1.2 cm renal cyst in the interpolar right kidney. Additional subcentimeter hypodense renal cortical lesions in both kidneys are too small to characterize and appear unchanged. Normal bladder. Stomach/Bowel: Grossly normal stomach. Normal caliber small bowel with no small bowel wall thickening. Normal appendix . Mild sigmoid diverticulosis, with no large bowel wall thickening or pericolonic fat stranding. Vascular/Lymphatic: Abdominal aortic atherosclerosis. Stable 3.6 cm infrarenal abdominal aortic aneurysm. Patent portal, splenic, hepatic and renal veins. No  pathologically enlarged lymph nodes in the abdomen or pelvis. Reproductive: Normal size prostate. Nonspecific coarse internal prostatic calcifications are stable. Other: No pneumoperitoneum, ascites or focal fluid collection. Stable small fat containing left inguinal hernia. Musculoskeletal: No aggressive appearing focal osseous lesions. No fracture. Moderate thoracolumbar spondylosis. IMPRESSION: 1. No acute traumatic injury or other acute abnormality in the abdomen or pelvis. 2. Diffuse hepatic steatosis.  No liver mass. 3. Status post cholecystectomy. Stable biliary ductal dilatation with common bile duct diameter 16 mm. Stable well-positioned biliary stent in the lower third of the common bile duct with expected pneumobilia. 4. Aortic atherosclerosis. Stable 3.6 cm infrarenal abdominal aortic aneurysm. Recommend followup by ultrasound in 2 years. This recommendation follows ACR consensus guidelines: White Paper of the ACR Incidental Findings Committee II on Vascular Findings. J Am Coll Radiol 2013; 10:789-794. 5. Coronary atherosclerosis. 6. Mild sigmoid diverticulosis. 7. Stable small fat containing left inguinal hernia. Electronically Signed   By: Ilona Sorrel M.D.   On: 09/20/2016 18:21   Dg Chest Port 1 View  Result Date: 09/20/2016 CLINICAL DATA:  Fever.  The patient fell. EXAM: PORTABLE CHEST 1 VIEW COMPARISON:  03/26/2016 FINDINGS: Heart size and pulmonary vascularity are normal and the lungs are clear. Pacemaker in place. No acute bone abnormality. IMPRESSION: No acute abnormalities. Electronically Signed   By: Lorriane Rich M.D.   On: 09/20/2016 16:14   Ir  Int Ext Biliary Drain With Cholangiogram  Result Date: 09/20/2016 INDICATION: 83 year old male with acute cholangitis with a possibly obstructed biliary stent. EXAM: IR INT-EXT BILIARY DRAIN W/ CHOLANGIOGRAM MEDICATIONS: Patient recently received intravenous Rocephin, azithromycin and Flagyl while in the emergency room. No additional  antibiotic prophylaxis administered. ANESTHESIA/SEDATION: Moderate (conscious) sedation was employed during this procedure. A total of Versed 1.5 mg and Fentanyl 125 mcg was administered intravenously. Moderate Sedation Time: 54 minutes. [name] level of consciousness and vital signs were monitored continuously by radiology nursing throughout the procedure under my direct supervision. FLUOROSCOPY TIME:  Fluoroscopy Time: 28 minutes 30 seconds (851 mGy). COMPLICATIONS: None immediate. PROCEDURE: Informed written consent was obtained from the patient after a thorough discussion of the procedural risks, benefits and alternatives. All questions were addressed. Maximal Sterile Barrier Technique was utilized including caps, mask, sterile gowns, sterile gloves, sterile drape, hand hygiene and skin antiseptic. A timeout was performed prior to the initiation of the procedure. The liver was interrogated with ultrasound. There is no significant intrahepatic biliary ductal dilatation outside of the hilum. A Chiba needle was advanced from a lateral approach at the level of the mid axillary line. The needle was advanced of the hepatic parenchyma in then slowly withdrawn well injecting contrast material and still a of biliary radicle was then captured. A percutaneous transhepatic cholangiogram was then performed. There is significant dilatation of the common bile duct and central hepatic ducts. The more peripheral hepatic ducts are decompressed. The biliary stent is occluded, however a small amount of contrast material passes adjacent to the biliary stent and into the duodenum. A night tracks wire was successfully navigated into the biliary tree. The GDA needle was then exchanged for the Accustick sheath. A gentle hand injection of contrast material under fluoroscopy confirmed sheath location within the dilated common bile duct. A 4 French angled glide catheter was advanced coaxially through the Accustick sheath an  successfully navigated adjacent to the stent and into the duodenum overall wire. An Amplatz wire was then advanced into the duodenum. The Accustick sheath and 4 French catheter were removed. The skin tract was dilated to 10 [place] and a Cook 10.2 [place] biliary drainage catheter was advanced over the wire and formed with the locking loop in the duodenum. Hand injection of contrast material confirms final tube placement. Overall, the patient tolerated the procedure well. IMPRESSION: 1. Percutaneous transhepatic cholangiogram demonstrates an occluded stent in the common bile duct. There is slow passage of a small amount of contrast material adjacent to the stent, through the ampulla and into the duodenum. 2. There is significant dilatation of the common hepatic and common bile ducts. 3. Successful placement of a 10.2 French internal/external biliary drainage catheter which passes adjacent to the occluded stent. Signed, Criselda Peaches, MD [facility] Radiology Electronically Signed   By: Jacqulynn Cadet M.D.   On: 09/20/2016 22:06     CBC  Recent Labs Lab 09/25/16 0229 09/26/16 0212 09/27/16 0317 09/28/16 0623 09/29/16 0311  WBC 14.7* 14.3* 17.4* 17.6* 21.6*  HGB 15.2 15.4 15.0 13.8 13.8  HCT 45.3 46.8 45.0 41.8 42.3  PLT 342 249 305 314 332  MCV 94.0 96.5 95.1 95.9 95.9  MCH 31.5 31.8 31.7 31.7 31.3  MCHC 33.6 32.9 33.3 33.0 32.6  RDW 14.1 14.6 14.3 14.4 14.3  LYMPHSABS 3.0 2.7 3.8 3.7 5.5*  MONOABS 0.8 0.6 0.9 0.7 1.3*  EOSABS 0.1 0.0 0.0 0.2 0.1  BASOSABS 0.0 0.0 0.0 0.0  0.0    Chemistries   Recent Labs Lab 09/22/16 0925 09/24/16 0548 09/25/16 0229 09/26/16 0212 09/26/16 1224 09/27/16 0317  NA 139 144 143  --  146* 147*  K 3.3* 4.6 4.1  --  4.3 4.7  CL 112* 111 110  --  111 113*  CO2 18* 20* 23  --  21* 21*  GLUCOSE 151* 145* 204*  --  244* 199*  BUN 12 14 16   --  36* 44*  CREATININE 1.16 1.20 1.20  --  3.49* 4.70*   CALCIUM 8.4* 9.0 9.4  --  9.4 9.2  MG 1.7 2.2 2.2 2.3  --  2.7*  AST 153*  --  32  --  39 61*  ALT 156*  --  59  --  36 28  ALKPHOS 176*  --  171*  --  145* 135*  BILITOT 1.1  --  0.5  --  0.8 2.0*   ------------------------------------------------------------------------------------------------------------------ No results for input(s): CHOL, HDL, LDLCALC, TRIG, CHOLHDL, LDLDIRECT in the last 72 hours.  Lab Results  Component Value Date   HGBA1C 7.9 (H) 03/27/2016   ------------------------------------------------------------------------------------------------------------------ No results for input(s): TSH, T4TOTAL, T3FREE, THYROIDAB in the last 72 hours.  Invalid input(s): FREET3 ------------------------------------------------------------------------------------------------------------------ No results for input(s): VITAMINB12, FOLATE, FERRITIN, TIBC, IRON, RETICCTPCT in the last 72 hours.  Coagulation profile No results for input(s): INR, PROTIME in the last 168 hours.  No results for input(s): DDIMER in the last 72 hours.  Cardiac Enzymes No results for input(s): CKMB, TROPONINI, MYOGLOBIN in the last 168 hours.  Invalid input(s): CK ------------------------------------------------------------------------------------------------------------------ No results found for: BNP   Meyah Corle M.D on 09/29/2016 at 8:52 AM  Between 7am to 7pm - Pager - [phone]  After 7pm go to [email or web address] - password [email or web address]  [facility] -  Office  [phone]  Dragon dictation system was used to create this note, attempts have been made to correct errors, however presence of uncorrected errors is not a reflection quality of care provided

## 2016-09-29 NOTE — Progress Notes (Signed)
MD at bedside, talking to family with Rn. Patient has no urine output in condom cath bag. MD aware, MD and RN at bedside to talk with family about medical decisions as far as what they want to do for patient to be comfortable, and also taking with wife about respiratory status, renal output, and BP.

## 2016-09-29 NOTE — Progress Notes (Signed)
Page to Md about Bp, MD gave RN verbal consent to place order for 230ml bolus once. RN will administer.  Rn told Md that patients urine was "tea colored" and only 164ml came out after inserting foley catheter almost 2 hours ago. RN also called wife to alert her of Bp (per MD telling RN okay to call family). RN unable to reach wife, reached daughter Harlow Asa , at her home residence who said she would get in contact with mother. RN told patients daughter that we (the hospital) would help to accommodate them , in any way necessary if they were wanting to stay the night (recliner in room for family member to sleep on). RN will administer bolus and continue to monitor patient.

## 2016-09-29 NOTE — Progress Notes (Signed)
[facility] Liaison: RN visit for [place] request from [place] for family interest in [facility] with request for transfer today if possible. Chart reviewed, met with wife in room to confirm interest and explain services. Bed availability for Sunday 09/30/16 and family agreeable to transfer tomorrow. Left message for CSW. Registration paperwork completed. Dr. Orpah Melter to assume care per family request.   Please fax discharge summary to [phone].  RN please call report to [phone].  Please arrange transport for patient to arrive before noon if possible.  Thank you. Margaretmary Eddy, RN, BSN [facility] Liaison [phone]   [facility] hospital liaisons are now listed on [place].  Please feel free to contact me at the above number or call [phone].

## 2016-09-29 NOTE — Clinical Social Work Note (Signed)
Referral made to [facility] per family's request. [facility] may [facility] able to accept patient on Sunday, [place] reviewing patient's info.   Liz Beach MSW, [place], [place], JI:[id number]

## 2016-09-30 DIAGNOSIS — G9340 Encephalopathy, unspecified: Secondary | ICD-10-CM

## 2016-09-30 LAB — CBC WITH DIFFERENTIAL/PLATELET
BASOS ABS: 0 10*3/uL (ref 0.0–0.1)
BASOS PCT: 0 %
EOS ABS: 0.1 10*3/uL (ref 0.0–0.7)
EOS PCT: 1 %
HCT: 39.4 % (ref 39.0–52.0)
HEMOGLOBIN: 12.8 g/dL — AB (ref 13.0–17.0)
Lymphocytes Relative: 23 %
Lymphs Abs: 3.7 10*3/uL (ref 0.7–4.0)
MCH: 31.1 pg (ref 26.0–34.0)
MCHC: 32.5 g/dL (ref 30.0–36.0)
MCV: 95.6 fL (ref 78.0–100.0)
Monocytes Absolute: 0.8 10*3/uL (ref 0.1–1.0)
Monocytes Relative: 5 %
NEUTROS PCT: 71 %
Neutro Abs: 11.6 10*3/uL — ABNORMAL HIGH (ref 1.7–7.7)
PLATELETS: 281 10*3/uL (ref 150–400)
RBC: 4.12 MIL/uL — ABNORMAL LOW (ref 4.22–5.81)
RDW: 14.5 % (ref 11.5–15.5)
WBC: 16.2 10*3/uL — AB (ref 4.0–10.5)

## 2016-09-30 LAB — GLUCOSE, CAPILLARY
GLUCOSE-CAPILLARY: 221 mg/dL — AB (ref 65–99)
GLUCOSE-CAPILLARY: 186 mg/dL — AB (ref 65–99)
GLUCOSE-CAPILLARY: 147 mg/dL — AB (ref 65–99)

## 2016-09-30 MED ORDER — ONDANSETRON HCL 4 MG/2ML IJ SOLN: 4.0000 mg | Freq: Four times a day (QID) | INTRAMUSCULAR | 0 refills | Status: AC | PRN

## 2016-09-30 MED ORDER — LORAZEPAM 2 MG/ML IJ SOLN: 0.5000 mg | Freq: Four times a day (QID) | INTRAMUSCULAR | 0 refills | Status: AC | PRN

## 2016-09-30 NOTE — Progress Notes (Signed)
Pt has audible rhonchus/coarse crackles breath sounds at the door/hallway in the unit. Pt refuse NTS prn at this time.Marland Kitchen Pt pushes me away and is combative when I  try to suction his mouth or NTS. Pt is very unpleasant and doesn't want to be bothered. Pt aware of refusal. SATs are 100%.

## 2016-09-30 NOTE — Plan of Care (Signed)
Problem: Fluid Volume: Goal: Ability to maintain a balanced intake and output will improve Outcome: Not Met (add Reason) Patient is having decreased urine output despite IVF initiation and bolus through the day on 1/6. MD aware.

## 2016-09-30 NOTE — Discharge Summary (Signed)
[name], is a 83 y.o. male  DOB [birth year]  MRN [id number].  Admission date:  09/20/2016  Admitting Physician  Rise Patience, MD  Discharge Date:  09/30/2016   Primary MD  Stephens Shire, MD  Recommendations for primary care physician for things to follow:   Hospice Home   Admission Diagnosis  Cholangitis [K83.0] Sepsis, due to unspecified organism [facility]) [A41.9]   Discharge Diagnosis  Cholangitis [K83.0] Sepsis, due to unspecified organism [facility]) [A41.9]    Principal Problem:   Sepsis ([place]) Active Problems:   Coronary artery disease   DM2 (diabetes mellitus, type 2) ([place])   Systolic CHF, chronic ([place])   COPD with emphysema ([place])   Pacemaker-St.Jude   Acute cholangitis   Acute encephalopathy   CAD in native artery   AV block, Mobitz II   Uncontrolled type 2 diabetes mellitus with complication ([place])   Dementia associated with other underlying disease with behavioral disturbance   Chronic diastolic CHF (congestive heart failure) ([place])   Cholangitis   Bacteremia due to Klebsiella pneumoniae   Bacteremia due to Escherichia coli      Past Medical History:  Diagnosis Date  . Bacteremia due to vancomycin resistant Enterococcus   . Cancer ([place])    precancer skin lesions  . Chronic renal insufficiency   . COPD (chronic obstructive pulmonary disease) with chronic bronchitis ([place])   . Coronary artery disease   . Dementia in Alzheimer's disease with delirium   . Diabetes mellitus   . Duodenal ulcer   . Gallstone pancreatitis    recurrent  . GERD (gastroesophageal reflux disease)   . GI bleed   . Hearing impairment   . Hyperlipidemia   . Hypertension   . Myocardial infarction, anterior wall, subsequent care   . Obesities, morbid ([place])   . Second degree AV block, Mobitz type II    a. s/p STJ dual chamber PPM  . Sepsis ([place]) 03/2016  . TIA (transient ischemic attack)      Past Surgical History:  Procedure Laterality Date  . BILIARY STENT PLACEMENT N/A 07/09/2014   Procedure: BILIARY STENT PLACEMENT;  Surgeon: Beryle Beams, MD;  Location: [facility] ENDOSCOPY;  Service: Endoscopy;  Laterality: N/A;  . CHOLECYSTECTOMY    . ERCP N/A 05/30/2014   Procedure: ENDOSCOPIC RETROGRADE CHOLANGIOPANCREATOGRAPHY (ERCP);  Surgeon: Ladene Artist, MD;  Location[facility] ENDOSCOPY;  Service: Endoscopy;  Laterality: N/A;  . ERCP N/A 07/09/2014   Procedure: ENDOSCOPIC RETROGRADE CHOLANGIOPANCREATOGRAPHY (ERCP);  Surgeon: Beryle Beams, MD;  Location: [facility] END[facility];  Service: Endoscopy;  Laterality: N/A;  . IR GENERIC HISTORICAL  09/20/2016   IR INT EXT BILIARY DRAIN WITH CHOLANGIOGRAM 09/20/2016 Jacqulynn Cadet, MD [facility]-INTERV RAD  . PACEMAKER PLACEMENT  01/01/11   STJ dual chamber PPM implanted by Dr Rayann Heman for Mobitz II   . pancreatic stent         HPI  from the history and physical done on the day of admission:     HPI: [name] is a 83  y.o. male with history of CAD status post stenting, second degree AV block status post pacemaker placement, diabetes mellitus type 2, CHF, COPD, dementia was brought to the ER after patient was found to be weak and confused. As per patient's wife patient was getting weak over the last 2 days and today while walking to the dining table patient's slumped to the. Did not hit his head or lose consciousness. In the ER patient was found to be hypotensive with labs showing markedly elevated LFTs and Lactate. Patient has known history of CBD stones with recurrent obstruction. Has had stents placed previously. CT abdomen and pelvis was done which shows dilated duct with infrarenal aortic aneurysm. Dr. Benson Norway, patient's gastroenterologist was consulted and since patient has difficult ERCP previously IR has been consulted for drain placement and has been placed by the time I examined the patient. On my exam patient appears confused and moves all extremities.    ED Course: Fluid bolus for sepsis was given and empiric antibiotics started. Gastroenterologist and interventional radiology was consulted. Right-sided biliary drain has been placed    Hospital Course:    Interval history 83 y.o.WMPMHxDementia in Alzheimer's disease with delirium CAD S/P stenting, 2d degreeAV block Mobitz type II S/P pacemaker placement, DM type 2, Chronic Diastolic CHF, HTN, HLD, COPD,   Brought to the ER afterpatient was found to be weak and confused. As per patient's wife patient wasgetting weak over the last 2 days and today while walking to the dining table patient's slumped to the. Did not hit his head or lose consciousness. In the ER patient was found to be hypotensive with labs showing markedly elevated LFTs and Lactate. Patient has known history of CBD stones with recurrent obstruction. Has had stents placed previously. CT abdomen and pelvis was done which shows dilated duct with infrarenal aortic aneurysm. Dr. Benson Norway, patient's gastroenterologist was consulted and since patient has difficult ERCP previously IR has been consulted for drain placement and has been placed by the time I examined the patient. On my exam patient appears confused and moves all extremities.  Gi Service signed off on 09/29/16  transfer to hospice home on 09/30/2016 with hospice and comfort measures    1)Sepsis/Cholangitis - -S/P IR placement Biliary drain , infectious disease recommendation was for 10 day course of antibiotics (Dr. Michel Bickers), Dr. Doreene Burke was going to replace stent on 1/4/18however when patient was taken to OR , anesthesiology and Dr. Nolene Bernheim patient too unstable to survive procedure and procedure was aborted. Patient's wife requested palliative and hospice care consult.  Gi Service signed off on 09/29/16,  transfer to hospice home on 09/30/2016 with hospice and comfort measures   2)positiveE.coli/Enterobacteriaceae/KLEBSIELLA PNEUMONIAE bacteremia-  12/28/17blood culturewith pansensitive Klebsiella, repeat blood cultures from 09/24/2016 negative, treated with Rocephin and Flagyl. Patient's wife requested palliative and hospice care consult.  Gi Service signed off on 09/29/16,  transfer to hospice home on 09/30/2016 with hospice and comfort measures  3)Acute on Chronic Encephalopathy- no significant improvement, hospice consult appreciated, overall prognosis is grave, CT head negative, more lethargic, Patient's wife requested palliative and hospice care consult.  Gi Service signed off on 09/29/16,  transfer to hospice home on 09/30/2016 with hospice and comfort measures  4)Social/Ethics-  palliative care input appreciated,  Increased secretions, gurgling sounding , Poor urine output, more lethargic, soft blood pressures, Overall patient looks ill,  poor prognosis discussed with family.  Transfer to Hospice home on 09/30/2016 with hospice and comfort measures. Overall prognosis is grave .  Patient's wife requested palliative and hospice care consult.  Gi Service signed off on 09/29/16,  transfer to hospice home on 09/30/2016 with hospice and comfort measures   5)AKI- renal function is worsening, creatinine above 4.7, very poor urine output  Code Status:DNR  Disposition Plan:transfer to hospice home on 09/30/2016 with hospice and comfort measures  Consults :GI, general surgery, infectious disease and palliative care   DVT Prophylaxis: SCDs    **   Discharge Condition: Grave Prognosis  [facility]    Discharge Instructions    Bed rest    Complete by:  As directed    Diet general    Complete by:  As directed    Discharge instructions    Complete by:  As directed    Discharge to hospice home on 09/30/2016 with comfort measures/hospice care        Discharge Medications     Allergies as of 09/30/2016      Reactions   Doxycycline Other (See Comments)   Unknown- patient doesn't remember    Penicillins Rash   Has patient had a PCN reaction causing immediate rash, facial/tongue/throat swelling, SOB or lightheadedness with hypotension: Yes Has patient had a PCN reaction causing severe rash involving mucus membranes or skin necrosis: No Has patient had a PCN reaction that required hospitalization: No Has patient had a PCN reaction occurring within the last 10 years: Yes If all of the above answers are "NO", then may proceed with Cephalosporin use.      Medication List    STOP taking these medications   aspirin 325 MG tablet   CENTRUM SILVER PO   FISH OIL PO   fluticasone furoate-vilanterol 100-25 MCG/INH Aepb Commonly known as:  BREO ELLIPTA   folic acid 1 MG tablet Commonly known as:  FOLVITE   hydrochlorothiazide 25 MG tablet Commonly known as:  HYDRODIURIL   losartan 50 MG tablet Commonly known as:  COZAAR   MAGNESIUM SULFATE PO   metoprolol 50 MG tablet Commonly known as:  LOPRESSOR   NITROSTAT 0.4 MG SL tablet Generic drug:  nitroGLYCERIN   omeprazole 20 MG capsule Commonly known as:  PRILOSEC   polyethylene glycol packet Commonly known as:  MIRALAX / GLYCOLAX   simvastatin 20 MG tablet Commonly known as:  ZOCOR     TAKE these medications   LORazepam 2 MG/ML injection Commonly known as:  ATIVAN Inject 0.25-0.5 mLs (0.5-1 mg total) into the vein every 6 (six) hours as needed (Only use for Agitation if QT prolongation present).   ondansetron 4 MG/2ML Soln injection Commonly known as:  ZOFRAN Inject 2 mLs (4 mg total) into the vein every 6 (six) hours as needed for nausea.       Major procedures and Radiology Reports - PLEASE review detailed and final reports for all details, in brief -   Patient's wife requested palliative and hospice care consult.  Gi Service signed off on 09/29/16,  transfer to hospice home on 09/30/2016 with hospice and comfort measures    Ct Head Wo Contrast  Result Date: 09/20/2016 CLINICAL DATA:  Unwitnessed fall  earlier today. Altered mental status. Dementia. EXAM: CT HEAD WITHOUT CONTRAST TECHNIQUE: Contiguous axial images were obtained from the base of the skull through the vertex without intravenous contrast. COMPARISON:  05/31/2016 head CT . FINDINGS: Brain: No evidence of parenchymal hemorrhage or extra-axial fluid collection. No mass lesion, mass effect, or midline shift. No CT evidence of acute infarction. Intracranial atherosclerosis. Generalized cerebral volume loss.  Nonspecific moderate subcortical and periventricular white matter hypodensity, most in keeping with chronic small vessel ischemic change. No ventriculomegaly. Vascular: No hyperdense vessel or unexpected calcification. Skull: No evidence of calvarial fracture. Sinuses/Orbits: The visualized paranasal sinuses are essentially clear. Other:  The mastoid air cells are unopacified. IMPRESSION: 1. No evidence of acute intracranial abnormality. No evidence of calvarial fracture. 2. Generalized cerebral volume loss and moderate chronic small vessel ischemia. Electronically Signed   By: Ilona Sorrel M.D.   On: 09/20/2016 18:46   Ct Abdomen Pelvis W Contrast  Result Date: 09/20/2016 CLINICAL DATA:  83 year old male with dementia presents after unwitnessed fall with elevated liver function tests. History of ERCP with stone extraction and biliary stent placement 07/09/2014. EXAM: CT ABDOMEN AND PELVIS WITH CONTRAST TECHNIQUE: Multidetector CT imaging of the abdomen and pelvis was performed using the standard protocol following bolus administration of intravenous contrast. CONTRAST:  75 cc ISOVUE-300 IOPAMIDOL (ISOVUE-300) INJECTION 61% COMPARISON:  03/26/2016 CT abdomen/ pelvis. Right upper quadrant abdominal sonogram from 03/29/2016. FINDINGS: Lower chest: Right lower lobe 5 mm solid pulmonary nodule (series 6/image 1), stable since 05/03/2010 and considered benign. Pacemaker leads are seen terminating in the right atrium and right ventricular apex. Coronary  atherosclerosis. Hepatobiliary: Diffuse hepatic steatosis. No liver mass. Cholecystectomy. Stable pneumobilia in the left liver lobe. Stable mild central intrahepatic biliary ductal dilatation. Stable common bile duct diameter 16 mm. Biliary stent is in place in the lower third of the common bile duct. Pancreas: Stable nonspecific mild ectasia of the ventral pancreatic duct in the pancreatic head, maximum diameter 5 mm. No pancreatic mass. No peripancreatic fluid collections. Spleen: Normal size. No mass. Adrenals/Urinary Tract: Normal adrenals. No hydronephrosis. Simple 1.2 cm renal cyst in the interpolar right kidney. Additional subcentimeter hypodense renal cortical lesions in both kidneys are too small to characterize and appear unchanged. Normal bladder. Stomach/Bowel: Grossly normal stomach. Normal caliber small bowel with no small bowel wall thickening. Normal appendix . Mild sigmoid diverticulosis, with no large bowel wall thickening or pericolonic fat stranding. Vascular/Lymphatic: Abdominal aortic atherosclerosis. Stable 3.6 cm infrarenal abdominal aortic aneurysm. Patent portal, splenic, hepatic and renal veins. No pathologically enlarged lymph nodes in the abdomen or pelvis. Reproductive: Normal size prostate. Nonspecific coarse internal prostatic calcifications are stable. Other: No pneumoperitoneum, ascites or focal fluid collection. Stable small fat containing left inguinal hernia. Musculoskeletal: No aggressive appearing focal osseous lesions. No fracture. Moderate thoracolumbar spondylosis. IMPRESSION: 1. No acute traumatic injury or other acute abnormality in the abdomen or pelvis. 2. Diffuse hepatic steatosis.  No liver mass. 3. Status post cholecystectomy. Stable biliary ductal dilatation with common bile duct diameter 16 mm. Stable well-positioned biliary stent in the lower third of the common bile duct with expected pneumobilia. 4. Aortic atherosclerosis. Stable 3.6 cm infrarenal abdominal  aortic aneurysm. Recommend followup by ultrasound in 2 years. This recommendation follows ACR consensus guidelines: White Paper of the ACR Incidental Findings Committee II on Vascular Findings. J Am Coll Radiol 2013; 10:789-794. 5. Coronary atherosclerosis. 6. Mild sigmoid diverticulosis. 7. Stable small fat containing left inguinal hernia. Electronically Signed   By: Ilona Sorrel M.D.   On: 09/20/2016 18:21   Dg Chest Port 1 View  Result Date: 09/20/2016 CLINICAL DATA:  Fever.  The patient fell. EXAM: PORTABLE CHEST 1 VIEW COMPARISON:  03/26/2016 FINDINGS: Heart size and pulmonary vascularity are normal and the lungs are clear. Pacemaker in place. No acute bone abnormality. IMPRESSION: No acute abnormalities. Electronically Signed   By: Lorriane Shire M.D.  On: 09/20/2016 16:14   Ir Int Lianne Cure Biliary Drain With Cholangiogram  Result Date: 09/20/2016 INDICATION: 83 year old male with acute cholangitis with a possibly obstructed biliary stent. EXAM: IR INT-EXT BILIARY DRAIN W/ CHOLANGIOGRAM MEDICATIONS: Patient recently received intravenous Rocephin, azithromycin and Flagyl while in the emergency room. No additional antibiotic prophylaxis administered. ANESTHESIA/SEDATION: Moderate (conscious) sedation was employed during this procedure. A total of Versed 1.5 mg and Fentanyl 125 mcg was administered intravenously. Moderate Sedation Time: 54 minutes. [name] level of consciousness and vital signs were monitored continuously by radiology nursing throughout the procedure under my direct supervision. FLUOROSCOPY TIME:  Fluoroscopy Time: 28 minutes 30 seconds (851 mGy). COMPLICATIONS: None immediate. PROCEDURE: Informed written consent was obtained from the patient after a thorough discussion of the procedural risks, benefits and alternatives. All questions were addressed. Maximal Sterile Barrier Technique was utilized including caps, mask, sterile gowns, sterile gloves, sterile drape, hand hygiene and skin  antiseptic. A timeout was performed prior to the initiation of the procedure. The liver was interrogated with ultrasound. There is no significant intrahepatic biliary ductal dilatation outside of the hilum. A Chiba needle was advanced from a lateral approach at the level of the mid axillary line. The needle was advanced of the hepatic parenchyma in then slowly withdrawn well injecting contrast material and still a of biliary radicle was then captured. A percutaneous transhepatic cholangiogram was then performed. There is significant dilatation of the common bile duct and central hepatic ducts. The more peripheral hepatic ducts are decompressed. The biliary stent is occluded, however a small amount of contrast material passes adjacent to the biliary stent and into the duodenum. A night tracks wire was successfully navigated into the biliary tree. The GDA needle was then exchanged for the Accustick sheath. A gentle hand injection of contrast material under fluoroscopy confirmed sheath location within the dilated common bile duct. A 4 French angled glide catheter was advanced coaxially through the Accustick sheath an successfully navigated adjacent to the stent and into the duodenum overall wire. An Amplatz wire was then advanced into the duodenum. The Accustick sheath and 4 French catheter were removed. The skin tract was dilated to 10 [place] and a Cook 10.2 [place] biliary drainage catheter was advanced over the wire and formed with the locking loop in the duodenum. Hand injection of contrast material confirms final tube placement. Overall, the patient tolerated the procedure well. IMPRESSION: 1. Percutaneous transhepatic cholangiogram demonstrates an occluded stent in the common bile duct. There is slow passage of a small amount of contrast material adjacent to the stent, through the ampulla and into the duodenum. 2. There is significant dilatation of the common hepatic and common bile ducts. 3. Successful placement  of a 10.2 French internal/external biliary drainage catheter which passes adjacent to the occluded stent. Signed, Criselda Peaches, MD [facility] Radiology Electronically Signed   By: Jacqulynn Cadet M.D.   On: 09/20/2016 22:06    Micro Results    Recent Results (from the past 240 hour(s))  Urine culture     Status: Abnormal   Collection Time: 09/20/16  3:46 PM  Result Value Ref Range Status   Specimen Description URINE, CLEAN CATCH  Final   Special Requests NONE  Final   Culture MULTIPLE SPECIES PRESENT, SUGGEST RECOLLECTION (A)  Final   Report Status 09/21/2016 FINAL  Final  Blood Culture (routine x 2)     Status: Abnormal   Collection Time: 09/20/16  3:50 PM  Result Value  Ref Range Status   Specimen Description BLOOD LEFT ANTECUBITAL  Final   Special Requests BOTTLES DRAWN AEROBIC AND ANAEROBIC 5CC  Final   Culture  Setup Time   Final    GRAM NEGATIVE RODS AEROBIC BOTTLE ONLY CRITICAL RESULT CALLED TO, READ BACK BY AND VERIFIED WITH: A JOHNSTON,PHARMD AT [id number] 09/21/16 BY L BENFIELD    Culture ESCHERICHIA COLI (A)  Final   Report Status 09/23/2016 FINAL  Final   Organism ID, Bacteria ESCHERICHIA COLI  Final      Susceptibility   Escherichia coli - MIC*    AMPICILLIN >=32 RESISTANT Resistant     CEFAZOLIN >=64 RESISTANT Resistant     CEFEPIME <=1 SENSITIVE Sensitive     CEFTAZIDIME <=1 SENSITIVE Sensitive     CEFTRIAXONE <=1 SENSITIVE Sensitive     CIPROFLOXACIN >=4 RESISTANT Resistant     GENTAMICIN >=16 RESISTANT Resistant     IMIPENEM <=0.25 SENSITIVE Sensitive     TRIMETH/SULFA >=320 RESISTANT Resistant     AMPICILLIN/SULBACTAM >=32 RESISTANT Resistant     PIP/TAZO >=128 RESISTANT Resistant     Extended ESBL NEGATIVE Sensitive     * ESCHERICHIA COLI  Blood Culture ID Panel (Reflexed)     Status: Abnormal   Collection Time: 09/20/16  3:50 PM  Result Value Ref Range Status   Enterococcus species NOT DETECTED NOT  DETECTED Final   Listeria monocytogenes NOT DETECTED NOT DETECTED Final   Staphylococcus species NOT DETECTED NOT DETECTED Final   Staphylococcus aureus NOT DETECTED NOT DETECTED Final   Streptococcus species NOT DETECTED NOT DETECTED Final   Streptococcus agalactiae NOT DETECTED NOT DETECTED Final   Streptococcus pneumoniae NOT DETECTED NOT DETECTED Final   Streptococcus pyogenes NOT DETECTED NOT DETECTED Final   Acinetobacter baumannii NOT DETECTED NOT DETECTED Final   Enterobacteriaceae species DETECTED (A) NOT DETECTED Final    Comment: CRITICAL RESULT CALLED TO, READ BACK BY AND VERIFIED WITH: A JOHNSTON,PHARMD AT 0841 09/21/16 BY L BENFIELD    Enterobacter cloacae complex NOT DETECTED NOT DETECTED Final   Escherichia coli DETECTED (A) NOT DETECTED Final    Comment: CRITICAL RESULT CALLED TO, READ BACK BY AND VERIFIED WITH: A JOHNSTON,PHARMD AT 0841 09/21/16 BY L BENFIELD    Klebsiella oxytoca NOT DETECTED NOT DETECTED Final   Klebsiella pneumoniae NOT DETECTED NOT DETECTED Final   Proteus species NOT DETECTED NOT DETECTED Final   Serratia marcescens NOT DETECTED NOT DETECTED Final   Carbapenem resistance NOT DETECTED NOT DETECTED Final   Haemophilus influenzae NOT DETECTED NOT DETECTED Final   Neisseria meningitidis NOT DETECTED NOT DETECTED Final   Pseudomonas aeruginosa NOT DETECTED NOT DETECTED Final   Candida albicans NOT DETECTED NOT DETECTED Final   Candida glabrata NOT DETECTED NOT DETECTED Final   Candida krusei NOT DETECTED NOT DETECTED Final   Candida parapsilosis NOT DETECTED NOT DETECTED Final   Candida tropicalis NOT DETECTED NOT DETECTED Final  Blood Culture (routine x 2)     Status: Abnormal   Collection Time: 09/20/16  3:55 PM  Result Value Ref Range Status   Specimen Description BLOOD RIGHT ANTECUBITAL  Final   Special Requests BOTTLES DRAWN AEROBIC AND ANAEROBIC 5CC  Final   Culture  Setup Time   Final    GRAM NEGATIVE RODS AEROBIC BOTTLE ONLY CRITICAL  VALUE NOTED.  VALUE IS CONSISTENT WITH PREVIOUSLY REPORTED AND CALLED VALUE.    Culture (A)  Final    KLEBSIELLA PNEUMONIAE CRITICAL RESULT CALLED TO, READ BACK BY AND VERIFIED WITH:  J. [place], [place] ON [id number] BY Rhea Bleacher    Report Status 09/26/2016 FINAL  Final   Organism ID, Bacteria KLEBSIELLA PNEUMONIAE  Final      Susceptibility   Klebsiella pneumoniae - MIC*    AMPICILLIN >=32 RESISTANT Resistant     CEFAZOLIN <=4 SENSITIVE Sensitive     CEFEPIME <=1 SENSITIVE Sensitive     CEFTAZIDIME <=1 SENSITIVE Sensitive     CEFTRIAXONE <=1 SENSITIVE Sensitive     CIPROFLOXACIN <=0.25 SENSITIVE Sensitive     GENTAMICIN <=1 SENSITIVE Sensitive     IMIPENEM <=0.25 SENSITIVE Sensitive     TRIMETH/SULFA <=20 SENSITIVE Sensitive     AMPICILLIN/SULBACTAM 4 SENSITIVE Sensitive     PIP/TAZO <=4 SENSITIVE Sensitive     Extended ESBL NEGATIVE Sensitive     * KLEBSIELLA PNEUMONIAE  MRSA PCR Screening     Status: None   Collection Time: 09/21/16 12:27 AM  Result Value Ref Range Status   MRSA by PCR NEGATIVE NEGATIVE Final    Comment:        The GeneXpert MRSA Assay (FDA approved for NASAL specimens only), is one component of a comprehensive MRSA colonization surveillance program. It is not intended to diagnose MRSA infection nor to guide or monitor treatment for MRSA infections.   Culture, blood (routine x 2)     Status: None   Collection Time: 09/24/16  5:48 AM  Result Value Ref Range Status   Specimen Description BLOOD RIGHT ANTECUBITAL  Final   Special Requests IN PEDIATRIC BOTTLE 2CC  Final   Culture NO GROWTH 5 DAYS  Final   Report Status 09/29/2016 FINAL  Final  Culture, blood (routine x 2)     Status: None   Collection Time: 09/24/16  5:48 AM  Result Value Ref Range Status   Specimen Description BLOOD BLOOD RIGHT HAND  Final   Special Requests IN PEDIATRIC BOTTLE [facility]  Final   Culture NO GROWTH 5 DAYS  Final   Report Status 09/29/2016 FINAL  Final    Today    Subjective    Taeo Shellhammer today has poor urine output and is more lethargic, Patient's wife requested palliative and hospice care consult.  Gi Service signed off on 09/29/16,  transfer to hospice home on 09/30/2016 with hospice and comfort measures          Patient has been seen and examined prior to discharge   Objective   Blood pressure 128/79, pulse 87, temperature 97.6 F (36.4 C), temperature source Axillary, resp. rate 20, height 5\' 6"  (1.676 m), weight 75.1 kg (165 lb 9.1 oz), SpO2 99 %.   Intake/Output Summary (Last 24 hours) at 09/30/16 0942 Last data filed at 09/30/16 0800  Gross per 24 hour  Intake             3330 ml  Output             1325 ml  Net             2005 ml    Exam Gen:- lethargic, alternates with periods of agitation HEENT:- [email or web address],   Neck-Supple Neck,No JVD,  Lungs- rhonchorous anteriorly CV- S1, S2 normal Abd-  +ve B.Sounds, Abd Soft, No tenderness, Rt UQ with cholecystomy drain (billious drain)    Extremity/Skin:- Intact peripheral pulses   Gu- foley with scant dark urine   Data Review   CBC w Diff: Lab Results  Component Value Date   WBC 16.2 (H) 09/30/2016   HGB 12.8 (  L) 09/30/2016   HCT 39.4 09/30/2016   PLT 281 09/30/2016   LYMPHOPCT 23 09/30/2016   MONOPCT 5 09/30/2016   EOSPCT 1 09/30/2016   BASOPCT 0 09/30/2016    CMP: Lab Results  Component Value Date   NA 147 (H) 09/27/2016   K 4.7 09/27/2016   CL 113 (H) 09/27/2016   CO2 21 (L) 09/27/2016   BUN 44 (H) 09/27/2016   CREATININE 4.70 (H) 09/27/2016   PROT 5.8 (L) 09/27/2016   ALBUMIN 2.8 (L) 09/27/2016   BILITOT 2.0 (H) 09/27/2016   ALKPHOS 135 (H) 09/27/2016   AST 61 (H) 09/27/2016   ALT 28 09/27/2016  .  Patient's wife requested palliative and hospice care consult.  [facility] signed off on 09/29/16,  transfer to hospice home on 09/30/2016 with hospice and comfort measures   Total Discharge time is about 33 minutes  Yuriel Lopezmartinez M.D on 09/30/2016 at 9:42  AM  [facility]   Office  [phone]  Dragon dictation system was used to create this note, attempts have been made to correct errors, however presence of uncorrected errors is not a reflection quality of care provided

## 2016-10-25 DEATH — deceased

## 2016-11-13 ENCOUNTER — Ambulatory Visit: Payer: Medicare HMO | Admitting: Pulmonary Disease

## 2016-11-16 ENCOUNTER — Ambulatory Visit: Payer: Medicare HMO | Admitting: Cardiology
